# Patient Record
Sex: Male | Born: 1935
Health system: Southern US, Community
[De-identification: ages and names within clinical notes are randomized; demographics above are authoritative.]

## PROBLEM LIST (undated history)

## (undated) DIAGNOSIS — E119 Type 2 diabetes mellitus without complications: Secondary | ICD-10-CM

## (undated) DIAGNOSIS — K5790 Diverticulosis of intestine, part unspecified, without perforation or abscess without bleeding: Secondary | ICD-10-CM

## (undated) DIAGNOSIS — E785 Hyperlipidemia, unspecified: Secondary | ICD-10-CM

## (undated) DIAGNOSIS — Z9861 Coronary angioplasty status: Secondary | ICD-10-CM

## (undated) DIAGNOSIS — M1990 Unspecified osteoarthritis, unspecified site: Secondary | ICD-10-CM

## (undated) DIAGNOSIS — I2102 ST elevation (STEMI) myocardial infarction involving left anterior descending coronary artery: Secondary | ICD-10-CM

## (undated) DIAGNOSIS — I480 Paroxysmal atrial fibrillation: Secondary | ICD-10-CM

## (undated) DIAGNOSIS — I10 Essential (primary) hypertension: Secondary | ICD-10-CM

## (undated) DIAGNOSIS — D649 Anemia, unspecified: Secondary | ICD-10-CM

## (undated) DIAGNOSIS — K219 Gastro-esophageal reflux disease without esophagitis: Secondary | ICD-10-CM

## (undated) DIAGNOSIS — I700 Atherosclerosis of aorta: Secondary | ICD-10-CM

## (undated) DIAGNOSIS — I639 Cerebral infarction, unspecified: Secondary | ICD-10-CM

## (undated) DIAGNOSIS — I739 Peripheral vascular disease, unspecified: Secondary | ICD-10-CM

## (undated) DIAGNOSIS — K2210 Ulcer of esophagus without bleeding: Secondary | ICD-10-CM

## (undated) DIAGNOSIS — E039 Hypothyroidism, unspecified: Secondary | ICD-10-CM

## (undated) DIAGNOSIS — N183 Chronic kidney disease, stage 3 unspecified: Secondary | ICD-10-CM

## (undated) DIAGNOSIS — K922 Gastrointestinal hemorrhage, unspecified: Secondary | ICD-10-CM

## (undated) DIAGNOSIS — K269 Duodenal ulcer, unspecified as acute or chronic, without hemorrhage or perforation: Secondary | ICD-10-CM

## (undated) DIAGNOSIS — I2510 Atherosclerotic heart disease of native coronary artery without angina pectoris: Secondary | ICD-10-CM

## (undated) DIAGNOSIS — E559 Vitamin D deficiency, unspecified: Secondary | ICD-10-CM

## (undated) DIAGNOSIS — R578 Other shock: Secondary | ICD-10-CM

## (undated) HISTORY — DX: ST elevation (STEMI) myocardial infarction involving left anterior descending coronary artery: I21.02

## (undated) HISTORY — DX: Duodenal ulcer, unspecified as acute or chronic, without hemorrhage or perforation: K26.9

## (undated) HISTORY — DX: Vitamin D deficiency, unspecified: E55.9

## (undated) HISTORY — DX: Atherosclerosis of aorta: I70.0

## (undated) HISTORY — DX: Atherosclerotic heart disease of native coronary artery without angina pectoris: Z98.61

## (undated) HISTORY — DX: Other shock: R57.8

## (undated) HISTORY — DX: Atherosclerotic heart disease of native coronary artery without angina pectoris: I25.10

## (undated) HISTORY — DX: Diverticulosis of intestine, part unspecified, without perforation or abscess without bleeding: K57.90

## (undated) HISTORY — DX: Unspecified osteoarthritis, unspecified site: M19.90

## (undated) HISTORY — DX: Chronic kidney disease, stage 3 unspecified: N18.30

## (undated) HISTORY — DX: Ulcer of esophagus without bleeding: K22.10

---

## 1998-10-01 HISTORY — PX: BASAL CELL CARCINOMA EXCISION: SHX1214

## 1999-09-13 ENCOUNTER — Ambulatory Visit (HOSPITAL_BASED_OUTPATIENT_CLINIC_OR_DEPARTMENT_OTHER): Admission: RE | Admit: 1999-09-13 | Discharge: 1999-09-13 | Payer: Self-pay | Admitting: Plastic Surgery

## 2000-10-01 HISTORY — PX: CARPAL TUNNEL RELEASE: SHX101

## 2000-10-21 ENCOUNTER — Encounter: Payer: Self-pay | Admitting: *Deleted

## 2000-10-21 ENCOUNTER — Encounter: Admission: RE | Admit: 2000-10-21 | Discharge: 2000-10-21 | Payer: Self-pay | Admitting: *Deleted

## 2000-10-23 ENCOUNTER — Ambulatory Visit (HOSPITAL_BASED_OUTPATIENT_CLINIC_OR_DEPARTMENT_OTHER): Admission: RE | Admit: 2000-10-23 | Discharge: 2000-10-23 | Payer: Self-pay | Admitting: *Deleted

## 2001-04-04 ENCOUNTER — Emergency Department (HOSPITAL_COMMUNITY): Admission: EM | Admit: 2001-04-04 | Discharge: 2001-04-04 | Payer: Self-pay | Admitting: Emergency Medicine

## 2003-10-21 ENCOUNTER — Ambulatory Visit (HOSPITAL_COMMUNITY): Admission: RE | Admit: 2003-10-21 | Discharge: 2003-10-21 | Payer: Self-pay | Admitting: *Deleted

## 2009-10-01 HISTORY — PX: BASAL CELL CARCINOMA EXCISION: SHX1214

## 2010-11-07 ENCOUNTER — Emergency Department (HOSPITAL_BASED_OUTPATIENT_CLINIC_OR_DEPARTMENT_OTHER): Admission: EM | Admit: 2010-11-07 | Payer: Self-pay | Source: Home / Self Care

## 2010-11-07 ENCOUNTER — Emergency Department (HOSPITAL_COMMUNITY): Payer: Medicare Other

## 2010-11-07 ENCOUNTER — Inpatient Hospital Stay (HOSPITAL_COMMUNITY)
Admission: EM | Admit: 2010-11-07 | Discharge: 2010-11-10 | DRG: 064 | Disposition: A | Discharge status: Skilled Nursing Facility | Payer: Medicare Other | Attending: Internal Medicine | Admitting: Internal Medicine

## 2010-11-07 ENCOUNTER — Emergency Department (HOSPITAL_BASED_OUTPATIENT_CLINIC_OR_DEPARTMENT_OTHER)
Admission: EM | Admit: 2010-11-07 | Discharge: 2010-11-07 | Disposition: A | Discharge status: Other Acute Inpatient Hospital | Payer: Medicare Other | Source: Home / Self Care | Admitting: Emergency Medicine

## 2010-11-07 ENCOUNTER — Inpatient Hospital Stay (HOSPITAL_COMMUNITY)
Admission: AD | Admit: 2010-11-07 | Payer: Self-pay | Source: Other Acute Inpatient Hospital | Admitting: Internal Medicine

## 2010-11-07 DIAGNOSIS — R5381 Other malaise: Secondary | ICD-10-CM | POA: Diagnosis present

## 2010-11-07 DIAGNOSIS — Z7982 Long term (current) use of aspirin: Secondary | ICD-10-CM

## 2010-11-07 DIAGNOSIS — R7309 Other abnormal glucose: Secondary | ICD-10-CM | POA: Diagnosis present

## 2010-11-07 DIAGNOSIS — K219 Gastro-esophageal reflux disease without esophagitis: Secondary | ICD-10-CM | POA: Insufficient documentation

## 2010-11-07 DIAGNOSIS — I498 Other specified cardiac arrhythmias: Secondary | ICD-10-CM | POA: Diagnosis present

## 2010-11-07 DIAGNOSIS — I6350 Cerebral infarction due to unspecified occlusion or stenosis of unspecified cerebral artery: Principal | ICD-10-CM | POA: Diagnosis present

## 2010-11-07 DIAGNOSIS — E7800 Pure hypercholesterolemia, unspecified: Secondary | ICD-10-CM | POA: Diagnosis present

## 2010-11-07 DIAGNOSIS — I10 Essential (primary) hypertension: Secondary | ICD-10-CM | POA: Insufficient documentation

## 2010-11-07 DIAGNOSIS — R29898 Other symptoms and signs involving the musculoskeletal system: Secondary | ICD-10-CM | POA: Diagnosis present

## 2010-11-07 DIAGNOSIS — E669 Obesity, unspecified: Secondary | ICD-10-CM | POA: Diagnosis present

## 2010-11-07 DIAGNOSIS — M109 Gout, unspecified: Secondary | ICD-10-CM | POA: Diagnosis present

## 2010-11-07 DIAGNOSIS — E1100 Type 2 diabetes mellitus with hyperosmolarity without nonketotic hyperglycemic-hyperosmolar coma (NKHHC): Secondary | ICD-10-CM | POA: Diagnosis present

## 2010-11-07 DIAGNOSIS — E785 Hyperlipidemia, unspecified: Secondary | ICD-10-CM | POA: Diagnosis present

## 2010-11-07 LAB — COMPREHENSIVE METABOLIC PANEL
ALT: 31 U/L (ref 0–53)
ALT: 24 U/L (ref 0–53)
AST: 35 U/L (ref 0–37)
AST: 31 U/L (ref 0–37)
Albumin: 4.6 g/dL (ref 3.5–5.2)
Albumin: 4.1 g/dL (ref 3.5–5.2)
Alkaline Phosphatase: 64 U/L (ref 39–117)
Alkaline Phosphatase: 35 U/L — ABNORMAL LOW (ref 39–117)
BUN: 27 mg/dL — ABNORMAL HIGH (ref 6–23)
BUN: 23 mg/dL (ref 6–23)
CO2: 20 mEq/L (ref 19–32)
CO2: 24 mEq/L (ref 19–32)
Calcium: 10.4 mg/dL (ref 8.4–10.5)
Calcium: 9.9 mg/dL (ref 8.4–10.5)
Chloride: 100 mEq/L (ref 96–112)
Chloride: 106 mEq/L (ref 96–112)
Creatinine, Ser: 1.2 mg/dL (ref 0.4–1.5)
Creatinine, Ser: 1.25 mg/dL (ref 0.4–1.5)
GFR calc Af Amer: >60 mL/min (ref >60)
GFR calc Af Amer: >60 mL/min (ref >60)
GFR calc non Af Amer: 58 mL/min — ABNORMAL LOW (ref >60)
GFR calc non Af Amer: 55 mL/min — ABNORMAL LOW (ref >60)
Glucose, Bld: 599 mg/dL (ref 70–99)
Glucose, Bld: 250 mg/dL — ABNORMAL HIGH (ref 70–99)
Potassium: 4.7 mEq/L (ref 3.5–5.1)
Potassium: 4.4 mEq/L (ref 3.5–5.1)
Sodium: 136 mEq/L (ref 135–145)
Sodium: 140 mEq/L (ref 135–145)
Total Bilirubin: 1.1 mg/dL (ref 0.3–1.2)
Total Bilirubin: 1.1 mg/dL (ref 0.3–1.2)
Total Protein: 7.9 g/dL (ref 6.0–8.3)
Total Protein: 6.9 g/dL (ref 6.0–8.3)

## 2010-11-07 LAB — CBC
HCT: 41.1 % (ref 39.0–52.0)
HCT: 40.1 % (ref 39.0–52.0)
Hemoglobin: 15 g/dL (ref 13.0–17.0)
Hemoglobin: 14.5 g/dL (ref 13.0–17.0)
MCH: 33 pg (ref 26.0–34.0)
MCH: 33 pg (ref 26.0–34.0)
MCHC: 36.5 g/dL — ABNORMAL HIGH (ref 30.0–36.0)
MCHC: 36.2 g/dL — ABNORMAL HIGH (ref 30.0–36.0)
MCV: 90.5 fL (ref 78.0–100.0)
MCV: 91.3 fL (ref 78.0–100.0)
Platelets: 285 10*3/uL (ref 150–400)
Platelets: 258 10*3/uL (ref 150–400)
RBC: 4.54 MIL/uL (ref 4.22–5.81)
RBC: 4.39 MIL/uL (ref 4.22–5.81)
RDW: 12.5 % (ref 11.5–15.5)
RDW: 12.5 % (ref 11.5–15.5)
WBC: 12.9 10*3/uL — ABNORMAL HIGH (ref 4.0–10.5)
WBC: 14.6 10*3/uL — ABNORMAL HIGH (ref 4.0–10.5)

## 2010-11-07 LAB — APTT: aPTT: 24 seconds (ref 24–37)

## 2010-11-07 LAB — DIFFERENTIAL
Basophils Absolute: 0 10*3/uL (ref 0.0–0.1)
Basophils Absolute: 0.1 10*3/uL (ref 0.0–0.1)
Basophils Relative: 0 % (ref 0–1)
Basophils Relative: 0 % (ref 0–1)
Eosinophils Absolute: 0 10*3/uL (ref 0.0–0.7)
Eosinophils Absolute: 0 10*3/uL (ref 0.0–0.7)
Eosinophils Relative: 0 % (ref 0–5)
Eosinophils Relative: 0 % (ref 0–5)
Lymphocytes Relative: 15 % (ref 12–46)
Lymphocytes Relative: 22 % (ref 12–46)
Lymphs Abs: 1.9 10*3/uL (ref 0.7–4.0)
Lymphs Abs: 3.2 10*3/uL (ref 0.7–4.0)
Monocytes Absolute: 1.3 10*3/uL — ABNORMAL HIGH (ref 0.1–1.0)
Monocytes Absolute: 1.4 10*3/uL — ABNORMAL HIGH (ref 0.1–1.0)
Monocytes Relative: 10 % (ref 3–12)
Monocytes Relative: 9 % (ref 3–12)
Neutro Abs: 9.7 10*3/uL — ABNORMAL HIGH (ref 1.7–7.7)
Neutro Abs: 10 10*3/uL — ABNORMAL HIGH (ref 1.7–7.7)
Neutrophils Relative %: 75 % (ref 43–77)
Neutrophils Relative %: 68 % (ref 43–77)

## 2010-11-07 LAB — URINALYSIS, ROUTINE W REFLEX MICROSCOPIC
Bilirubin Urine: NEGATIVE
Bilirubin Urine: NEGATIVE
Hgb urine dipstick: NEGATIVE
Hgb urine dipstick: NEGATIVE
Ketones, ur: NEGATIVE mg/dL
Ketones, ur: 15 mg/dL — AB
Leukocytes, UA: NEGATIVE
Leukocytes, UA: NEGATIVE
Nitrite: NEGATIVE
Nitrite: NEGATIVE
Protein, ur: NEGATIVE mg/dL
Protein, ur: NEGATIVE mg/dL
Specific Gravity, Urine: 1.029 (ref 1.005–1.030)
Specific Gravity, Urine: 1.024 (ref 1.005–1.030)
Urine Glucose, Fasting: >1000 mg/dL — AB
Urine Glucose, Fasting: >1000 mg/dL — AB
Urobilinogen, UA: 0.2 mg/dL (ref 0.0–1.0)
Urobilinogen, UA: 0.2 mg/dL (ref 0.0–1.0)
pH: 5.5 (ref 5.0–8.0)
pH: 6 (ref 5.0–8.0)

## 2010-11-07 LAB — PROTIME-INR
INR: 0.99 (ref 0.00–1.49)
Prothrombin Time: 13.3 seconds (ref 11.6–15.2)

## 2010-11-07 LAB — URINE MICROSCOPIC-ADD ON

## 2010-11-07 LAB — CK TOTAL AND CKMB (NOT AT ARMC)
CK, MB: 4.6 ng/mL — ABNORMAL HIGH (ref 0.3–4.0)
Relative Index: 2.2 (ref 0.0–2.5)
Total CK: 206 U/L (ref 7–232)

## 2010-11-07 LAB — TROPONIN I: Troponin I: 0.02 ng/mL (ref 0.00–0.06)

## 2010-11-08 ENCOUNTER — Inpatient Hospital Stay (HOSPITAL_COMMUNITY): Payer: Medicare Other

## 2010-11-08 LAB — BASIC METABOLIC PANEL
BUN: 16 mg/dL (ref 6–23)
CO2: 22 mEq/L (ref 19–32)
Calcium: 8.6 mg/dL (ref 8.4–10.5)
Chloride: 108 mEq/L (ref 96–112)
Creatinine, Ser: 0.96 mg/dL (ref 0.4–1.5)
GFR calc Af Amer: >60 mL/min (ref >60)
GFR calc non Af Amer: >60 mL/min (ref >60)
Glucose, Bld: 208 mg/dL — ABNORMAL HIGH (ref 70–99)
Potassium: 3.6 mEq/L (ref 3.5–5.1)
Sodium: 137 mEq/L (ref 135–145)

## 2010-11-08 LAB — GLUCOSE, CAPILLARY
Glucose-Capillary: 583 mg/dL (ref 70–99)
Glucose-Capillary: 228 mg/dL — ABNORMAL HIGH (ref 70–99)
Glucose-Capillary: 249 mg/dL — ABNORMAL HIGH (ref 70–99)
Glucose-Capillary: 279 mg/dL — ABNORMAL HIGH (ref 70–99)
Glucose-Capillary: 259 mg/dL — ABNORMAL HIGH (ref 70–99)
Glucose-Capillary: 374 mg/dL — ABNORMAL HIGH (ref 70–99)
Glucose-Capillary: 206 mg/dL — ABNORMAL HIGH (ref 70–99)

## 2010-11-08 LAB — MAGNESIUM: Magnesium: 1.8 mg/dL (ref 1.5–2.5)

## 2010-11-08 LAB — LIPID PANEL
Cholesterol: 130 mg/dL (ref 0–200)
HDL: 29 mg/dL — ABNORMAL LOW (ref >39)
LDL Cholesterol: 47 mg/dL (ref 0–99)
Total CHOL/HDL Ratio: 4.5 RATIO
Triglycerides: 268 mg/dL — ABNORMAL HIGH (ref <150)
VLDL: 54 mg/dL — ABNORMAL HIGH (ref 0–40)

## 2010-11-08 LAB — CARDIAC PANEL(CRET KIN+CKTOT+MB+TROPI)
CK, MB: 5.8 ng/mL — ABNORMAL HIGH (ref 0.3–4.0)
Relative Index: 1.4 (ref 0.0–2.5)
Total CK: 401 U/L — ABNORMAL HIGH (ref 7–232)
Troponin I: 0.01 ng/mL (ref 0.00–0.06)

## 2010-11-08 LAB — POCT CARDIAC MARKERS
CKMB, poc: 3.1 ng/mL (ref 1.0–8.0)
Myoglobin, poc: 314 ng/mL (ref 12–200)
Troponin i, poc: <0.05 ng/mL (ref 0.00–0.09)

## 2010-11-08 LAB — CBC
HCT: 41.7 % (ref 39.0–52.0)
Hemoglobin: 15 g/dL (ref 13.0–17.0)
MCH: 33.6 pg (ref 26.0–34.0)
MCHC: 36 g/dL (ref 30.0–36.0)
MCV: 93.5 fL (ref 78.0–100.0)
Platelets: 245 10*3/uL (ref 150–400)
RBC: 4.46 MIL/uL (ref 4.22–5.81)
RDW: 12.8 % (ref 11.5–15.5)
WBC: 15.1 10*3/uL — ABNORMAL HIGH (ref 4.0–10.5)

## 2010-11-08 LAB — HEMOGLOBIN A1C
Hgb A1c MFr Bld: 11.2 % — ABNORMAL HIGH (ref <5.7)
Mean Plasma Glucose: 275 mg/dL — ABNORMAL HIGH (ref <117)

## 2010-11-09 ENCOUNTER — Inpatient Hospital Stay (HOSPITAL_COMMUNITY): Payer: Medicare Other

## 2010-11-09 DIAGNOSIS — I6789 Other cerebrovascular disease: Secondary | ICD-10-CM

## 2010-11-09 LAB — GLUCOSE, CAPILLARY
Glucose-Capillary: 194 mg/dL — ABNORMAL HIGH (ref 70–99)
Glucose-Capillary: 265 mg/dL — ABNORMAL HIGH (ref 70–99)
Glucose-Capillary: 274 mg/dL — ABNORMAL HIGH (ref 70–99)
Glucose-Capillary: 293 mg/dL — ABNORMAL HIGH (ref 70–99)
Glucose-Capillary: 224 mg/dL — ABNORMAL HIGH (ref 70–99)
Glucose-Capillary: 219 mg/dL — ABNORMAL HIGH (ref 70–99)

## 2010-11-09 LAB — LIPASE, BLOOD: Lipase: 34 U/L (ref 11–59)

## 2010-11-09 LAB — CARDIAC PANEL(CRET KIN+CKTOT+MB+TROPI)
CK, MB: 4.8 ng/mL — ABNORMAL HIGH (ref 0.3–4.0)
CK, MB: 6.3 ng/mL (ref 0.3–4.0)
Relative Index: 1.4 (ref 0.0–2.5)
Relative Index: 1.7 (ref 0.0–2.5)
Total CK: 332 U/L — ABNORMAL HIGH (ref 7–232)
Total CK: 371 U/L — ABNORMAL HIGH (ref 7–232)
Troponin I: 0.02 ng/mL (ref 0.00–0.06)
Troponin I: 0.01 ng/mL (ref 0.00–0.06)

## 2010-11-09 LAB — BASIC METABOLIC PANEL
BUN: 14 mg/dL (ref 6–23)
CO2: 24 mEq/L (ref 19–32)
Calcium: 8.7 mg/dL (ref 8.4–10.5)
Chloride: 105 mEq/L (ref 96–112)
Creatinine, Ser: 0.89 mg/dL (ref 0.4–1.5)
GFR calc Af Amer: >60 mL/min (ref >60)
GFR calc non Af Amer: >60 mL/min (ref >60)
Glucose, Bld: 269 mg/dL — ABNORMAL HIGH (ref 70–99)
Potassium: 3.7 mEq/L (ref 3.5–5.1)
Sodium: 136 mEq/L (ref 135–145)

## 2010-11-10 LAB — BASIC METABOLIC PANEL
BUN: 22 mg/dL (ref 6–23)
CO2: 21 mEq/L (ref 19–32)
Calcium: 8.7 mg/dL (ref 8.4–10.5)
Chloride: 106 mEq/L (ref 96–112)
Creatinine, Ser: 0.88 mg/dL (ref 0.4–1.5)
GFR calc Af Amer: >60 mL/min (ref >60)
GFR calc non Af Amer: >60 mL/min (ref >60)
Glucose, Bld: 247 mg/dL — ABNORMAL HIGH (ref 70–99)
Potassium: 4 mEq/L (ref 3.5–5.1)
Sodium: 136 mEq/L (ref 135–145)

## 2010-11-10 LAB — GLUCOSE, CAPILLARY
Glucose-Capillary: 237 mg/dL — ABNORMAL HIGH (ref 70–99)
Glucose-Capillary: 344 mg/dL — ABNORMAL HIGH (ref 70–99)

## 2010-11-12 ENCOUNTER — Inpatient Hospital Stay (HOSPITAL_COMMUNITY)
Admission: EM | Admit: 2010-11-12 | Discharge: 2010-11-20 | DRG: 378 | Disposition: A | Discharge status: Skilled Nursing Facility | Payer: Medicare Other | Attending: Endocrinology | Admitting: Endocrinology

## 2010-11-12 ENCOUNTER — Emergency Department (HOSPITAL_COMMUNITY): Payer: Medicare Other

## 2010-11-12 DIAGNOSIS — K264 Chronic or unspecified duodenal ulcer with hemorrhage: Principal | ICD-10-CM | POA: Diagnosis present

## 2010-11-12 DIAGNOSIS — I6529 Occlusion and stenosis of unspecified carotid artery: Secondary | ICD-10-CM | POA: Diagnosis present

## 2010-11-12 DIAGNOSIS — Z794 Long term (current) use of insulin: Secondary | ICD-10-CM

## 2010-11-12 DIAGNOSIS — Z8673 Personal history of transient ischemic attack (TIA), and cerebral infarction without residual deficits: Secondary | ICD-10-CM

## 2010-11-12 DIAGNOSIS — M109 Gout, unspecified: Secondary | ICD-10-CM | POA: Diagnosis present

## 2010-11-12 DIAGNOSIS — E785 Hyperlipidemia, unspecified: Secondary | ICD-10-CM | POA: Diagnosis present

## 2010-11-12 DIAGNOSIS — D62 Acute posthemorrhagic anemia: Secondary | ICD-10-CM | POA: Diagnosis present

## 2010-11-12 DIAGNOSIS — K219 Gastro-esophageal reflux disease without esophagitis: Secondary | ICD-10-CM | POA: Diagnosis present

## 2010-11-12 DIAGNOSIS — I10 Essential (primary) hypertension: Secondary | ICD-10-CM | POA: Diagnosis present

## 2010-11-12 DIAGNOSIS — E7800 Pure hypercholesterolemia, unspecified: Secondary | ICD-10-CM | POA: Diagnosis present

## 2010-11-12 DIAGNOSIS — IMO0001 Reserved for inherently not codable concepts without codable children: Secondary | ICD-10-CM | POA: Diagnosis present

## 2010-11-12 LAB — COMPREHENSIVE METABOLIC PANEL
ALT: 20 U/L (ref 0–53)
AST: 21 U/L (ref 0–37)
Albumin: 2.8 g/dL — ABNORMAL LOW (ref 3.5–5.2)
Alkaline Phosphatase: 27 U/L — ABNORMAL LOW (ref 39–117)
BUN: 59 mg/dL — ABNORMAL HIGH (ref 6–23)
CO2: 21 mEq/L (ref 19–32)
Calcium: 8.8 mg/dL (ref 8.4–10.5)
Chloride: 104 mEq/L (ref 96–112)
Creatinine, Ser: 1.41 mg/dL (ref 0.4–1.5)
GFR calc Af Amer: 58 mL/min — ABNORMAL LOW (ref >60)
GFR calc non Af Amer: 48 mL/min — ABNORMAL LOW (ref >60)
Glucose, Bld: 407 mg/dL — ABNORMAL HIGH (ref 70–99)
Potassium: 4.8 mEq/L (ref 3.5–5.1)
Sodium: 136 mEq/L (ref 135–145)
Total Bilirubin: 0.4 mg/dL (ref 0.3–1.2)
Total Protein: 4.9 g/dL — ABNORMAL LOW (ref 6.0–8.3)

## 2010-11-12 LAB — PROTIME-INR
INR: 1.18 (ref 0.00–1.49)
Prothrombin Time: 15.2 seconds (ref 11.6–15.2)

## 2010-11-12 LAB — POCT I-STAT 3, VENOUS BLOOD GAS (G3P V)
Acid-base deficit: 3 mmol/L — ABNORMAL HIGH (ref 0.0–2.0)
Bicarbonate: 21 mEq/L (ref 20.0–24.0)
O2 Saturation: 41 %
TCO2: 22 mmol/L (ref 0–100)
pCO2, Ven: 34 mmHg — ABNORMAL LOW (ref 45.0–50.0)
pH, Ven: 7.398 — ABNORMAL HIGH (ref 7.250–7.300)
pO2, Ven: 23 mmHg — CL (ref 30.0–45.0)

## 2010-11-12 LAB — URINALYSIS, ROUTINE W REFLEX MICROSCOPIC
Bilirubin Urine: NEGATIVE
Hgb urine dipstick: NEGATIVE
Ketones, ur: 15 mg/dL — AB
Leukocytes, UA: NEGATIVE
Nitrite: NEGATIVE
Protein, ur: NEGATIVE mg/dL
Specific Gravity, Urine: 1.022 (ref 1.005–1.030)
Urine Glucose, Fasting: >1000 mg/dL — AB
Urobilinogen, UA: 0.2 mg/dL (ref 0.0–1.0)
pH: 5.5 (ref 5.0–8.0)

## 2010-11-12 LAB — CBC
HCT: 24.4 % — ABNORMAL LOW (ref 39.0–52.0)
Hemoglobin: 8.7 g/dL — ABNORMAL LOW (ref 13.0–17.0)
MCH: 33.5 pg (ref 26.0–34.0)
MCHC: 35.7 g/dL (ref 30.0–36.0)
MCV: 93.8 fL (ref 78.0–100.0)
Platelets: 251 10*3/uL (ref 150–400)
RBC: 2.6 MIL/uL — ABNORMAL LOW (ref 4.22–5.81)
RDW: 13.3 % (ref 11.5–15.5)
WBC: 17.6 10*3/uL — ABNORMAL HIGH (ref 4.0–10.5)

## 2010-11-12 LAB — OCCULT BLOOD, POC DEVICE: Fecal Occult Bld: POSITIVE

## 2010-11-12 LAB — URINE MICROSCOPIC-ADD ON

## 2010-11-12 LAB — POCT CARDIAC MARKERS
CKMB, poc: 1.1 ng/mL (ref 1.0–8.0)
Myoglobin, poc: 116 ng/mL (ref 12–200)
Troponin i, poc: <0.05 ng/mL (ref 0.00–0.09)

## 2010-11-12 LAB — APTT: aPTT: 22 seconds — ABNORMAL LOW (ref 24–37)

## 2010-11-12 NOTE — H&P (Signed)
NAMEALEXANDRE, FARIES NO.:  [id number]  MEDICAL RECORD NO.:  [id number]           PATIENT TYPE:  LOCATION:                                 FACILITY:  PHYSICIAN:  Houston Siren, MD           DATE OF BIRTH:  March 20, 1924  DATE OF ADMISSION: DATE OF DISCHARGE:                             HISTORY & PHYSICAL   PRIMARY CARE PHYSICIAN:  Brooke Bonito, MD  ADVANCE DIRECTIVE:  Full code.  REASON FOR ADMISSION:  Right brain stroke.  HISTORY OF PRESENT ILLNESS:  This is an 77 year old male with history of hypertension and onset diabetes, hypercholesterolemia who presented to the emergency room as a Code Stroke because of left-sided weakness and complained of leaning toward the left.  He was seen in consultation with the neurologist who did not think he is a TPA candidate because of his mild symptomatology.  The timing is also out of the TPA window as well as he reportedly had his initial symptoms more than 8 hours PTA.  Apparently, history also noted that his PCP was changing his oral hypoglycemic agent, glimepiride to Kombiglyze XR 01-999, but patient stopped it for a few days because it made him  malaise.  He could not elaborate the exact symptoms.  He has had no diarrhea, palpitation, shortness of breath, headache, nausea, vomiting, or any other symptomatology.  Workup included a CT scan of the head which shows question if there is a subacute right parietal CVA.  He also has elevated blood glucose in the 600.  His EKG shows sinus tachycardia at 110.  His potassium is 4.7, creatinine of 1.2.  He has negative cardiac enzymes.  He has slight elevation of his white count of 12,900, and hemoglobin of 15.0.  Hospitalist was asked to admit the patient for acute right hemispheric CVA.  PAST MEDICAL HISTORY:  Gout, GERD, hypercholesterolemia, hypertension, and diabetes.  SOCIAL HISTORY:  He denied tobacco, alcohol, or drug use.  He lives alone.  FAMILY HISTORY:   Noncontributory.  CURRENT MEDICATIONS:  Aspirin, Nexium, Crestor 20 mg per day, allopurinol, Kombiglyze XR 01-999, enalapril 5 mg per day, and Triplex 135 mg per day.  PHYSICAL EXAMINATION:  VITAL SIGNS:  Shows blood pressure 160/70, pulse of 100, respiratory rate of 20. GENERAL:  He is alert and oriented and conversing.  His speech is fluent.  Tongue is midline.  He had no facial symmetry.  I did not hear any carotid bruit. NECK:  Supple. CARDIAC:  Revealed an S1 and S2, irregular, without any murmur, rub, or gallop. LUNGS:  Clear with no rales or any evidence of consolidation. ABDOMEN:  Soft, nondistended, nontender. EXTREMITIES:  No edema.  No calf tenderness.  He has good distal pulses bilaterally.  Strength is weak on the left side.  He does have a pronator drift of the upper extremities. NEUROLOGIC:  Cerebellar function is off with finger-to-nose on the left and heel-to-shin on the left as well. PSYCHIATRIC:  Unremarkable as well.  OBJECTIVE FINDING:  EKG shows sinus tachycardia without any acute ST-T changes.  Troponin less  than 0.05.  Serum sodium 136, potassium 4.7, chloride of 100, glucose of 599, creatinine 1.2.  Liver function tests are normal.  White count of 12,900, hemoglobin of 15.0, platelet count of 285,000.  Urinalysis is negative.  IMPRESSION:  This is an 77 year old male who omitted his new diabetic medication because he was feeling malaise, presented with blood glucose of 600, along with a right brain cerebrovascular accident.  He was seen in consultation with neurologist, and we will admit him for further stroke workup.  He will get MRI and MRA of his brain along with carotid Doppler and transthoracic echo.  He will get his blood glucose under better control along with continue his full-dose aspirin.  I will continue his cholesterol pill including the Triplex and the Crestor.  We will put him on insulin sliding scale for now and hold his  oral antihyperglycemic agent.  He is a full code, will be admitted to the Hospitalist on the telemetry.     Houston Siren, MD     PL/MEDQ  D:  11/08/2010  T:  11/08/2010  Job:  [id number]  Electronically Signed by Houston Siren  on 11/12/2010 06:19:14 AM

## 2010-11-13 LAB — HEMOGLOBIN AND HEMATOCRIT, BLOOD
HCT: 22.3 % — ABNORMAL LOW (ref 39.0–52.0)
Hemoglobin: 7.9 g/dL — ABNORMAL LOW (ref 13.0–17.0)

## 2010-11-13 LAB — CARDIAC PANEL(CRET KIN+CKTOT+MB+TROPI)
CK, MB: 2.4 ng/mL (ref 0.3–4.0)
CK, MB: 2.5 ng/mL (ref 0.3–4.0)
Relative Index: INVALID (ref 0.0–2.5)
Relative Index: INVALID (ref 0.0–2.5)
Total CK: 34 U/L (ref 7–232)
Total CK: 40 U/L (ref 7–232)
Troponin I: 0.03 ng/mL (ref 0.00–0.06)
Troponin I: 0.05 ng/mL (ref 0.00–0.06)

## 2010-11-13 LAB — HEMOGLOBIN A1C
Hgb A1c MFr Bld: 10 % — ABNORMAL HIGH (ref <5.7)
Mean Plasma Glucose: 240 mg/dL — ABNORMAL HIGH (ref <117)

## 2010-11-13 LAB — MRSA PCR SCREENING: MRSA by PCR: NEGATIVE

## 2010-11-13 LAB — CK TOTAL AND CKMB (NOT AT ARMC)
CK, MB: 2.6 ng/mL (ref 0.3–4.0)
Relative Index: INVALID (ref 0.0–2.5)
Total CK: 42 U/L (ref 7–232)

## 2010-11-13 LAB — GLUCOSE, CAPILLARY
Glucose-Capillary: 383 mg/dL — ABNORMAL HIGH (ref 70–99)
Glucose-Capillary: 343 mg/dL — ABNORMAL HIGH (ref 70–99)
Glucose-Capillary: 343 mg/dL — ABNORMAL HIGH (ref 70–99)

## 2010-11-13 LAB — TROPONIN I: Troponin I: 0.03 ng/mL (ref 0.00–0.06)

## 2010-11-13 LAB — PREPARE RBC (CROSSMATCH)

## 2010-11-14 LAB — PHOSPHORUS: Phosphorus: 3.2 mg/dL (ref 2.3–4.6)

## 2010-11-14 LAB — CARDIAC PANEL(CRET KIN+CKTOT+MB+TROPI)
CK, MB: 3 ng/mL (ref 0.3–4.0)
CK, MB: 3.2 ng/mL (ref 0.3–4.0)
Relative Index: INVALID (ref 0.0–2.5)
Relative Index: INVALID (ref 0.0–2.5)
Total CK: 42 U/L (ref 7–232)
Total CK: 51 U/L (ref 7–232)
Troponin I: 0.03 ng/mL (ref 0.00–0.06)
Troponin I: 0.03 ng/mL (ref 0.00–0.06)

## 2010-11-14 LAB — GLUCOSE, CAPILLARY
Glucose-Capillary: 216 mg/dL — ABNORMAL HIGH (ref 70–99)
Glucose-Capillary: 227 mg/dL — ABNORMAL HIGH (ref 70–99)
Glucose-Capillary: 295 mg/dL — ABNORMAL HIGH (ref 70–99)
Glucose-Capillary: 233 mg/dL — ABNORMAL HIGH (ref 70–99)
Glucose-Capillary: 285 mg/dL — ABNORMAL HIGH (ref 70–99)
Glucose-Capillary: 287 mg/dL — ABNORMAL HIGH (ref 70–99)
Glucose-Capillary: 294 mg/dL — ABNORMAL HIGH (ref 70–99)
Glucose-Capillary: 301 mg/dL — ABNORMAL HIGH (ref 70–99)
Glucose-Capillary: 317 mg/dL — ABNORMAL HIGH (ref 70–99)
Glucose-Capillary: 349 mg/dL — ABNORMAL HIGH (ref 70–99)
Glucose-Capillary: 190 mg/dL — ABNORMAL HIGH (ref 70–99)
Glucose-Capillary: 187 mg/dL — ABNORMAL HIGH (ref 70–99)

## 2010-11-14 LAB — CBC
HCT: 27.2 % — ABNORMAL LOW (ref 39.0–52.0)
HCT: 25.8 % — ABNORMAL LOW (ref 39.0–52.0)
HCT: 23.2 % — ABNORMAL LOW (ref 39.0–52.0)
Hemoglobin: 9.6 g/dL — ABNORMAL LOW (ref 13.0–17.0)
Hemoglobin: 9 g/dL — ABNORMAL LOW (ref 13.0–17.0)
Hemoglobin: 8.1 g/dL — ABNORMAL LOW (ref 13.0–17.0)
MCH: 31.5 pg (ref 26.0–34.0)
MCH: 31.1 pg (ref 26.0–34.0)
MCH: 31.9 pg (ref 26.0–34.0)
MCHC: 35.3 g/dL (ref 30.0–36.0)
MCHC: 34.9 g/dL (ref 30.0–36.0)
MCHC: 34.9 g/dL (ref 30.0–36.0)
MCV: 89.2 fL (ref 78.0–100.0)
MCV: 89.3 fL (ref 78.0–100.0)
MCV: 91.3 fL (ref 78.0–100.0)
Platelets: 190 10*3/uL (ref 150–400)
Platelets: 160 10*3/uL (ref 150–400)
Platelets: 155 10*3/uL (ref 150–400)
RBC: 3.05 MIL/uL — ABNORMAL LOW (ref 4.22–5.81)
RBC: 2.89 MIL/uL — ABNORMAL LOW (ref 4.22–5.81)
RBC: 2.54 MIL/uL — ABNORMAL LOW (ref 4.22–5.81)
RDW: 14.9 % (ref 11.5–15.5)
RDW: 15.3 % (ref 11.5–15.5)
RDW: 15.4 % (ref 11.5–15.5)
WBC: 18.6 10*3/uL — ABNORMAL HIGH (ref 4.0–10.5)
WBC: 15.6 10*3/uL — ABNORMAL HIGH (ref 4.0–10.5)
WBC: 13.6 10*3/uL — ABNORMAL HIGH (ref 4.0–10.5)

## 2010-11-14 LAB — RENAL FUNCTION PANEL
Albumin: 2.5 g/dL — ABNORMAL LOW (ref 3.5–5.2)
BUN: 61 mg/dL — ABNORMAL HIGH (ref 6–23)
CO2: 24 mEq/L (ref 19–32)
Calcium: 8.2 mg/dL — ABNORMAL LOW (ref 8.4–10.5)
Chloride: 111 mEq/L (ref 96–112)
Creatinine, Ser: 1.39 mg/dL (ref 0.4–1.5)
GFR calc Af Amer: 59 mL/min — ABNORMAL LOW (ref >60)
GFR calc non Af Amer: 49 mL/min — ABNORMAL LOW (ref >60)
Glucose, Bld: 230 mg/dL — ABNORMAL HIGH (ref 70–99)
Phosphorus: 3 mg/dL (ref 2.3–4.6)
Potassium: 3.6 mEq/L (ref 3.5–5.1)
Sodium: 145 mEq/L (ref 135–145)

## 2010-11-14 LAB — MAGNESIUM
Magnesium: 1.6 mg/dL (ref 1.5–2.5)
Magnesium: 1.8 mg/dL (ref 1.5–2.5)

## 2010-11-14 NOTE — Consult Note (Signed)
NAMESHIZUO, BISKUP NO.:  [id number]  MEDICAL RECORD NO.:  [id number]           PATIENT TYPE:  I  LOCATION:  3739                         FACILITY:  [facility]  PHYSICIAN:  Marlan Palau, M.D.  DATE OF BIRTH:  [birth year]  DATE OF CONSULTATION:  11/07/2010 DATE OF DISCHARGE:  11/07/2010                                CONSULTATION   HISTORY OF PRESENT ILLNESS:  [name] is an 77 year old right-handed white male born on 1924/02/26, with a history of hypertension, diabetes.  This patient went to the [facility] this evening as he began to feel weak earlier today.  The patient was at the computer and noted onset of sensation of heaviness involving the left arm and left leg around 12 noon.  The patient denied any numbness on that side and denied headache and any visual changes.  The patient was able to drive his car to get lunch, but noted shortly thereafter that he was tending to lean to the left.  The patient went to the [facility] and was noted to have a blood sugar of around 600.  The patient has had some difficulty with left-sided weakness since that time.  The patient was evaluated by the ER physician at the [facility], and a code stroke was called around 9:30 p.m. this evening. The patient is sent urgently to [facility], but the code stroke was cancelled after it was determined that he is now about 10 hours out from the initial deficit.  NIH stroke scale score is 2.  The patient is not a t-PA candidate due to minimal deficit and due to duration of symptoms.  CT scan of the head appeared to show a small low density in the right parietal white matter, questionable whether this may be a subacute stroke.  Neurology is asked to see the patient for further evaluation.  PAST MEDICAL HISTORY:  Significant for: 1. New-onset right brain stroke. 2. Diabetes. 3. Hypertension. 4. Dyslipidemia. 5. Mild obesity. 6.  Gastroesophageal reflux disease. 7. Gout. 8. Bilateral cataract surgery. 9. Hyperosmolar state with elevated blood sugars.  MEDICATIONS: 1. Nexium 40 mg daily. 2. Aspirin 81 mg daily. 3. Crestor 20 mg daily. 4. Kombiglyze XR 01/999 one tablet daily. 5. Enalapril 5 mg daily. 6. Allopurinol 100 mg daily. 7. Trilipix 135 mg daily.  Again, the patient has no known allergies.  Does not smoke, drinks alcohol on occasions.  SOCIAL HISTORY:  This patient is widowed, lives in the [place], [place] area, has two daughters.  The patient is retired.  FAMILY MEDICAL HISTORY:  Father died with cancer.  Mother died with Alzheimer disease.  The patient was an only child.  REVIEW OF SYSTEMS:  Notable for no recent fevers, chills.  The patient denies headache, neck stiffness, back pain.  The patient denies shortness of breath, chest pains, abdominal pain, has had some urinary frequency over the last 2 days.  Denies nausea or vomiting.  The patient denied any blackout episodes of seizure events.  PHYSICAL EXAMINATION:  VITAL SIGNS:  Blood pressure of  160/73, heart rate 104, respiratory rate 20, temperature afebrile. GENERAL:  The patient is a minimally obese white male who is alert, cooperative at the time of examination. HEENT:  Head is atraumatic.  Eyes, pupils are equal, round, and reactive to light. NECK:  Supple.  No carotid bruits noted. RESPIRATORY:  Clear. CARDIOVASCULAR:  Regular rate and rhythm.  No obvious murmurs, rubs noted. EXTREMITIES:  Without significant edema. NEUROLOGIC:  Cranial nerves as above.  Facial symmetry is present.  The patient has good sensation of face to pinprick and soft touch bilaterally.  Has good strength in facial muscle and the muscles of head, trunk, and shoulder shrug bilaterally.  Speech is normal.  No aphasia is noted.  Motor testing reveals fairly good strength to direct testing on all fours.  The patient has minimal drift to the left  arm. No drift to the left leg with no drift on the right side.  The patient has clear evidence of ataxia with finger-nose-finger on the left arm, normal with the left leg and the right side.  The patient has good pinprick, soft touch, vibratory sensation throughout.  No evidence of extinction is noted.  Again, deep tendon reflexes are symmetric.  Toes downgoing.  LABORATORY DATA:  Notable for white count of 14.6, hemoglobin 14.5, hematocrit 40.1, MCV of 91.3, platelets of 258.  Sodium 136, potassium 4.7, chloride of 100, CO2 of 20, glucose of 599, BUN of 27, creatinine 1.2, total bili 1.1, alk phosphatase 64, SGOT of 35, SGPT of 31, total protein 7.9, albumin 4.6, calcium 10.4.  Urinalysis reveals specific gravity of 1.029, pH of 5.5.  IMPRESSION: 1. New-onset right brain stroke. 2. Diabetes with the hyperosmolar state. 3. Hypertension.  This patient clearly has risk factors for stroke, appears to have suffered a small right brain stroke.  The patient is not a t-PA candidate due to minimal deficit and duration of symptoms.  The patient will be admitted for further stroke workup.  PLAN: 1. Continue aspirin for now. 2. MRI of the brain. 3. MRI angiogram of the head. 4. Carotid Doppler study. 5. 2D echocardiogram.  We will follow the patient's clinical course     while in-house.  The patient may benefit from physical and occupational therapy.     Marlan Palau, M.D.     CKW/MEDQ  D:  11/07/2010  T:  11/08/2010  Job:  [id number]  cc:   Brooke Bonito, M.D. [facility]  Electronically Signed by Thana Farr M.D. on 11/14/2010 09:58:25 AM

## 2010-11-15 LAB — CBC
HCT: 21.6 % — ABNORMAL LOW (ref 39.0–52.0)
HCT: 28.7 % — ABNORMAL LOW (ref 39.0–52.0)
Hemoglobin: 7.5 g/dL — ABNORMAL LOW (ref 13.0–17.0)
Hemoglobin: 9.9 g/dL — ABNORMAL LOW (ref 13.0–17.0)
MCH: 31.9 pg (ref 26.0–34.0)
MCH: 31.5 pg (ref 26.0–34.0)
MCHC: 34.7 g/dL (ref 30.0–36.0)
MCHC: 34.5 g/dL (ref 30.0–36.0)
MCV: 91.9 fL (ref 78.0–100.0)
MCV: 91.4 fL (ref 78.0–100.0)
Platelets: 153 10*3/uL (ref 150–400)
Platelets: 148 10*3/uL — ABNORMAL LOW (ref 150–400)
RBC: 2.35 MIL/uL — ABNORMAL LOW (ref 4.22–5.81)
RBC: 3.14 MIL/uL — ABNORMAL LOW (ref 4.22–5.81)
RDW: 15.7 % — ABNORMAL HIGH (ref 11.5–15.5)
RDW: 16.5 % — ABNORMAL HIGH (ref 11.5–15.5)
WBC: 11.5 10*3/uL — ABNORMAL HIGH (ref 4.0–10.5)
WBC: 11.1 10*3/uL — ABNORMAL HIGH (ref 4.0–10.5)

## 2010-11-15 LAB — GLUCOSE, CAPILLARY
Glucose-Capillary: 133 mg/dL — ABNORMAL HIGH (ref 70–99)
Glucose-Capillary: 122 mg/dL — ABNORMAL HIGH (ref 70–99)
Glucose-Capillary: 190 mg/dL — ABNORMAL HIGH (ref 70–99)
Glucose-Capillary: 236 mg/dL — ABNORMAL HIGH (ref 70–99)

## 2010-11-15 LAB — COMPREHENSIVE METABOLIC PANEL
ALT: 14 U/L (ref 0–53)
AST: 18 U/L (ref 0–37)
Albumin: 2.2 g/dL — ABNORMAL LOW (ref 3.5–5.2)
Alkaline Phosphatase: 22 U/L — ABNORMAL LOW (ref 39–117)
BUN: 42 mg/dL — ABNORMAL HIGH (ref 6–23)
CO2: 24 mEq/L (ref 19–32)
Calcium: 8.1 mg/dL — ABNORMAL LOW (ref 8.4–10.5)
Chloride: 112 mEq/L (ref 96–112)
Creatinine, Ser: 1.25 mg/dL (ref 0.4–1.5)
GFR calc Af Amer: >60 mL/min (ref >60)
GFR calc non Af Amer: 55 mL/min — ABNORMAL LOW (ref >60)
Glucose, Bld: 100 mg/dL — ABNORMAL HIGH (ref 70–99)
Potassium: 3 mEq/L — ABNORMAL LOW (ref 3.5–5.1)
Sodium: 142 mEq/L (ref 135–145)
Total Bilirubin: 0.5 mg/dL (ref 0.3–1.2)
Total Protein: 3.9 g/dL — ABNORMAL LOW (ref 6.0–8.3)

## 2010-11-15 LAB — C-PEPTIDE: C-Peptide: 1.69 ng/mL (ref 0.80–3.90)

## 2010-11-16 LAB — CROSSMATCH
ABO/RH(D): A POS
Antibody Screen: POSITIVE
DAT, IgG: NEGATIVE
PT AG Type: NEGATIVE
Unit division: 0
Unit division: 0
Unit division: 0
Unit division: 0
Unit division: 0
Unit division: 0

## 2010-11-16 LAB — GLUCOSE, CAPILLARY
Glucose-Capillary: 199 mg/dL — ABNORMAL HIGH (ref 70–99)
Glucose-Capillary: 100 mg/dL — ABNORMAL HIGH (ref 70–99)
Glucose-Capillary: 163 mg/dL — ABNORMAL HIGH (ref 70–99)
Glucose-Capillary: 186 mg/dL — ABNORMAL HIGH (ref 70–99)

## 2010-11-16 LAB — CBC
HCT: 26.1 % — ABNORMAL LOW (ref 39.0–52.0)
Hemoglobin: 9.1 g/dL — ABNORMAL LOW (ref 13.0–17.0)
MCH: 31.3 pg (ref 26.0–34.0)
MCHC: 34.9 g/dL (ref 30.0–36.0)
MCV: 89.7 fL (ref 78.0–100.0)
Platelets: 176 10*3/uL (ref 150–400)
RBC: 2.91 MIL/uL — ABNORMAL LOW (ref 4.22–5.81)
RDW: 16.3 % — ABNORMAL HIGH (ref 11.5–15.5)
WBC: 9.9 10*3/uL (ref 4.0–10.5)

## 2010-11-16 LAB — BASIC METABOLIC PANEL
BUN: 21 mg/dL (ref 6–23)
CO2: 24 mEq/L (ref 19–32)
Calcium: 8.3 mg/dL — ABNORMAL LOW (ref 8.4–10.5)
Chloride: 111 mEq/L (ref 96–112)
Creatinine, Ser: 1.12 mg/dL (ref 0.4–1.5)
GFR calc Af Amer: >60 mL/min (ref >60)
GFR calc non Af Amer: >60 mL/min (ref >60)
Glucose, Bld: 165 mg/dL — ABNORMAL HIGH (ref 70–99)
Potassium: 3.9 mEq/L (ref 3.5–5.1)
Sodium: 141 mEq/L (ref 135–145)

## 2010-11-17 LAB — CBC
HCT: 27.8 % — ABNORMAL LOW (ref 39.0–52.0)
Hemoglobin: 9.3 g/dL — ABNORMAL LOW (ref 13.0–17.0)
MCH: 31 pg (ref 26.0–34.0)
MCHC: 33.5 g/dL (ref 30.0–36.0)
MCV: 92.7 fL (ref 78.0–100.0)
Platelets: 172 10*3/uL (ref 150–400)
RBC: 3 MIL/uL — ABNORMAL LOW (ref 4.22–5.81)
RDW: 17.1 % — ABNORMAL HIGH (ref 11.5–15.5)
WBC: 8.4 10*3/uL (ref 4.0–10.5)

## 2010-11-17 LAB — GLUCOSE, CAPILLARY
Glucose-Capillary: 190 mg/dL — ABNORMAL HIGH (ref 70–99)
Glucose-Capillary: 181 mg/dL — ABNORMAL HIGH (ref 70–99)
Glucose-Capillary: 198 mg/dL — ABNORMAL HIGH (ref 70–99)

## 2010-11-18 LAB — GLUCOSE, CAPILLARY
Glucose-Capillary: 102 mg/dL — ABNORMAL HIGH (ref 70–99)
Glucose-Capillary: 166 mg/dL — ABNORMAL HIGH (ref 70–99)
Glucose-Capillary: 75 mg/dL (ref 70–99)
Glucose-Capillary: 232 mg/dL — ABNORMAL HIGH (ref 70–99)
Glucose-Capillary: 216 mg/dL — ABNORMAL HIGH (ref 70–99)
Glucose-Capillary: 210 mg/dL — ABNORMAL HIGH (ref 70–99)

## 2010-11-18 LAB — CBC
HCT: 29.6 % — ABNORMAL LOW (ref 39.0–52.0)
Hemoglobin: 10 g/dL — ABNORMAL LOW (ref 13.0–17.0)
MCH: 31.1 pg (ref 26.0–34.0)
MCHC: 33.8 g/dL (ref 30.0–36.0)
MCV: 91.9 fL (ref 78.0–100.0)
Platelets: 213 10*3/uL (ref 150–400)
RBC: 3.22 MIL/uL — ABNORMAL LOW (ref 4.22–5.81)
RDW: 16.7 % — ABNORMAL HIGH (ref 11.5–15.5)
WBC: 10.1 10*3/uL (ref 4.0–10.5)

## 2010-11-19 LAB — CBC
HCT: 28.5 % — ABNORMAL LOW (ref 39.0–52.0)
Hemoglobin: 9.5 g/dL — ABNORMAL LOW (ref 13.0–17.0)
MCH: 31 pg (ref 26.0–34.0)
MCHC: 33.3 g/dL (ref 30.0–36.0)
MCV: 93.1 fL (ref 78.0–100.0)
Platelets: 247 10*3/uL (ref 150–400)
RBC: 3.06 MIL/uL — ABNORMAL LOW (ref 4.22–5.81)
RDW: 16.7 % — ABNORMAL HIGH (ref 11.5–15.5)
WBC: 12.6 10*3/uL — ABNORMAL HIGH (ref 4.0–10.5)

## 2010-11-19 LAB — BASIC METABOLIC PANEL
BUN: 14 mg/dL (ref 6–23)
CO2: 23 mEq/L (ref 19–32)
Calcium: 8.9 mg/dL (ref 8.4–10.5)
Chloride: 106 mEq/L (ref 96–112)
Creatinine, Ser: 1.02 mg/dL (ref 0.4–1.5)
GFR calc Af Amer: >60 mL/min (ref >60)
GFR calc non Af Amer: >60 mL/min (ref >60)
Glucose, Bld: 142 mg/dL — ABNORMAL HIGH (ref 70–99)
Potassium: 4.4 mEq/L (ref 3.5–5.1)
Sodium: 137 mEq/L (ref 135–145)

## 2010-11-19 LAB — GLUCOSE, CAPILLARY
Glucose-Capillary: 138 mg/dL — ABNORMAL HIGH (ref 70–99)
Glucose-Capillary: 253 mg/dL — ABNORMAL HIGH (ref 70–99)
Glucose-Capillary: 198 mg/dL — ABNORMAL HIGH (ref 70–99)
Glucose-Capillary: 195 mg/dL — ABNORMAL HIGH (ref 70–99)

## 2010-11-20 LAB — CBC
HCT: 28.7 % — ABNORMAL LOW (ref 39.0–52.0)
Hemoglobin: 9.4 g/dL — ABNORMAL LOW (ref 13.0–17.0)
MCH: 30.5 pg (ref 26.0–34.0)
MCHC: 32.8 g/dL (ref 30.0–36.0)
MCV: 93.2 fL (ref 78.0–100.0)
Platelets: 315 10*3/uL (ref 150–400)
RBC: 3.08 MIL/uL — ABNORMAL LOW (ref 4.22–5.81)
RDW: 16.2 % — ABNORMAL HIGH (ref 11.5–15.5)
WBC: 12.2 10*3/uL — ABNORMAL HIGH (ref 4.0–10.5)

## 2010-11-20 LAB — GLUCOSE, CAPILLARY
Glucose-Capillary: 119 mg/dL — ABNORMAL HIGH (ref 70–99)
Glucose-Capillary: 168 mg/dL — ABNORMAL HIGH (ref 70–99)

## 2010-11-20 NOTE — Discharge Summary (Signed)
NAMELANDERS, PRAJAPATI                  ACCOUNT NO.:  [id number]  MEDICAL RECORD NO.:  [id number]           PATIENT TYPE:  I  LOCATION:  3016                         FACILITY:  [facility]  PHYSICIAN:  Tera Mater. Evlyn Kanner, M.D. DATE OF BIRTH:  1924-01-06  DATE OF ADMISSION:  11/12/2010 DATE OF DISCHARGE:  11/20/2010                              DISCHARGE SUMMARY   DISCHARGE DIAGNOSES: 1. Severe gastrointestinal bleed due to duodenal ulcer, clinically     improved with stable hemoglobin. 2. Recent stroke with temporary discontinuation of antiplatelet agents     with good recovery thus far 3. Type 2 diabetes with poor prehospital control and good control at     the present. 4. Distant history of gout. 5. Hypertension with stable levels. 6. Hyperlipidemia. 7. Carotid disease, noncritical on the right.  CONSULTATIONS:  Gastroenterology, he was actually seen promptly on November 13, 2010.  PROCEDURES:  Included an endoscopy with a visible vessel and cauterization.  He also had 4 units of blood transfusion, two units on different occasions for a total of 4 units.  HOSPITAL COURSE:  Mr. Brunetto is an 77 year old white male patient admitted by the hospitalist service on November 12, 2010.  He had excellent care prior to my involvement in the case.  I was involved at the request of the family and saw him first on November 14, 2010.  The patient has done well in the last several days.  He did drop his hemoglobin again on the 16th and needed some more transfusions.  He has had no more bleeding since.  He had cleared a lot of old blood out of his colon.  It is important that we did see a clear cause of this that was cauterized and this plus the combination of Plavix and aspirin probably caused the GI bleed.  This is his first ever GI issue.  In fact up until the last hospitalization, he had not been in the hospital as an adult.  His neurologic status has remained stable and actually improving during  this hospitalization.  His hemoglobin A1c was elevated at 10% indicating inadequate prehospital control and now he is on insulin and doing much better.  He does have a easily measurable C-peptide that would suggest he could be transitioned to oral agents with or without insulin at later point.  He is doing well from a physical therapy standpoint.  He has been ambulating with the walker at a fairly rapid speech.  He has also been using the putty in his left hand to improve the strength on the Stroke side.  He has had no new neurologic problems while here.  Overall, he is in good spirits.  His diet has been advanced. He is eating well.  His blood pressure this morning is 146/64, pulse 90, respirations 16, temperature 97.3, O2 sat 99% on room air.  He has no pain.  He slept well.  His labs this morning, white count is 12,200, hemoglobin 9.4, MCV 93.2, platelets 315,000.  His sodium is 137, potassium 4.4, chloride 106, CO2 of 23, BUN 14, creatinine 1.02, glucose 142, calcium of  8.9, estimated GFR greater than 60.  At presentation during this hospitalization, his white count was 8.7 and he dropped down to 7.9 prior transfusion.  He popped back up and then dropped down on the 15 to 7.5 and has remained stable since.  Cardiac markers on the 14th showed troponin of 0.03.  His magnesium was fine at 1.8, phosphorous is fine at 3.2.  Another set of cardiac enzymes did also showed troponin of 0.03.  A hemoglobin A1c was elevated at 10% with an estimated mean plasma glucose of 240.  PCR screen for MRSA was negative and at no time did his CPK go up.  INR was 1.18 in hospitalization.  RADIOLOGY TESTING:  A chest x-ray on the 12 showed no acute cardiopulmonary findings.  Importantly on the 9th before he was in the last hospitalization, there was some intracranial atherosclerotic changes and 40% to 59% right coronary stenosis by ultrasound.  A few small infarcts in the border of the right central sulcus  right periatrial region were noted.  In summary, we have an 77 year old white male with back-to-back admissions first with a stroke.  He was making some recovery, was on dual antiplatelet and presented now with a GI bleed due to a duodenal ulcer.  The patient had a somewhat of a prolonged hospitalization due to recurrent need for transfusion and procedures.  He is now done well and has been advanced in his diet and much improved.  At discharge, he will resume enalapril 10 mg once daily.  His allopurinol was stopped.  He had been on some Ensure and he is eating well enough, I do not think he needs that.  He is on some MiraLax 17 grams once daily now.  He is on fenofibrate which is Trilipix 135 mg once daily.  He is on Crestor 20 mg once daily.  His insulin dose of Lantus has been 12 units twice daily and he is on a sliding scale plus a set dose.  At the current time, he has required 4 units with each meal and then added to that is a sliding scale based on the sugar with 3 units starting at 121-150, four units if 151-200, seven units if 201- 250, and 11 units if over 251.  The C-peptide does suggest we might be able to use other agents down the road, but at the moment insulin will be the treatment.  His Plavix is on hold and the decision on this is uncertain.  The thought is perhaps to be restart this in 2 weeks depending on his clinical status.  Right now, he is on Protonix 40 twice a day and I think I would do that for least another month.  He is going to a skilled nursing facility where a PT and OT will then taken.  His diet is no concentrated sweets.  His sugars are before meals and at bed with a sliding scale applied before meals only.  He is a full code at this time.  He has followup with Dr. Pearlean Brownie and his daughter will set this up.  He will be under the care of Dr. Royanne Foots at the [facility]. He will come to see me when he is discharged from there.  I recommend checking the  hemoglobin 4-5 days from now and then weekly for a couple of weeks.  He probably needs an iron level at some time, but he has had 4 units of blood and received fair amount of iron with that.  I am not starting on an iron supplement at the present time.  He is expected to do well.  Please call for any questions.          ______________________________ Tera Mater Evlyn Kanner, M.D.     SAS/MEDQ  D:  11/20/2010  T:  11/20/2010  Job:  [id number]  Electronically Signed by Adrian Prince M.D. on 11/20/2010 24:40:10 PM

## 2010-11-22 NOTE — Consult Note (Signed)
NAMEBLAND, RUDZINSKI                  ACCOUNT NO.:  [id number]  MEDICAL RECORD NO.:  [id number]           PATIENT TYPE:  I  LOCATION:  3312                         FACILITY:  [facility]  PHYSICIAN:  Hibah Odonnell L. Malon Kindle., M.D.DATE OF BIRTH:  31-Jan-1924  DATE OF CONSULTATION:  11/13/2010 DATE OF DISCHARGE:                                CONSULTATION   REFERRING SERVICE:  [facility].  PRIMARY CARE PHYSICIAN:  Brooke Bonito, MD  REASON FOR CONSULTATION:  GI bleeding.  HISTORY:  The patient is an 77 year old gentleman who was hospitalized earlier this month with a CVA.  He was hospitalized for 3 days from February 7 to November 10, 2010.  He had several small infarcts on the right side of his brain.  He also has uncontrolled diabetes and hypertension.  The patient does have a history of reflux disease and gout as well.  MRI showed the small strokes to be nonhemorrhagic and felt to be ischemic in origin.  He was discharged on aspirin and Plavix. The patient was readmitted with melenic stools.  He has had heartburn in the past, has been on Nexium and other medications and actually was taking Protonix on his admission.  He has never had an endoscopy and never had known ulcerations.  His hemoglobin was 15 on November 08, 2010, it was 8.7 this admission with melenic stools and has dropped to 7.9 after transfusion.  His Pro-time is 15.  BUN is 59 and creatinine 1.4. Liver tests are fairly unremarkable.  The patient has had previous colonoscopies done by Dr. Virginia Rochester in the past.  He said about 10 years ago that was normal.  He has not had any bright red blood or hematochezia.  MEDICATIONS ON ADMISSION:  Protonix, enalapril, allopurinol, Crestor, Trilipix, promethazine, insulin, and acetaminophen.  DRUG ALLERGIES:  None.  PAST MEDICAL HISTORY:  Diabetes and hypertension with recent CVA.  He has had gout and GERD.  He has had cataract surgery.  Colonoscopy was done in 2005 that showed severe  diverticular disease but no polyps.  The patient apparently had an upper endoscopy at that time, although he does not remember it.  There was a question of Barrett esophagus with erosions and ulcerations in the duodenal bulb.  This showed some ulceration in the esophagus and presumably that is why the patient was started on Protonix.  FAMILY HISTORY:  Noncontributory.  PHYSICAL EXAMINATION:  VITAL SIGNS:  Temperature 97.7, pulse 115, and blood pressure 133/70. GENERAL:  Alert white male lying in hospital bed. EYES:  Sclerae nonicteric. NECK:  Supple. LUNGS:  Clear. HEART:  Regular rate and rhythm without murmurs or gallops. ABDOMEN:  Soft and nontender.  ASSESSMENT:  Gastrointestinal bleed with elevated BUN and creatinine being normal, it is very likely an upper gastrointestinal source.  The patient has had previous esophagitis on EGD in the past by Dr. Virginia Rochester.  PLAN:  I agree with holding his aspirin and Plavix and I think we need to go ahead with an endoscopy and control the bleeding this afternoon. I have discussed this in detail with the patient's daughter.  ______________________________ Llana Aliment Malon Kindle., M.D.     Waldron Session  D:  11/13/2010  T:  11/14/2010  Job:  [id number]  cc:   Brooke Bonito, M.D.  Electronically Signed by Carman Ching M.D. on 11/22/2010 01:51:31 PM

## 2010-11-22 NOTE — Op Note (Signed)
NAMEJAYLUN, FLEENER                  ACCOUNT NO.:  [id number]  MEDICAL RECORD NO.:  [id number]           PATIENT TYPE:  LOCATION:                                 FACILITY:  PHYSICIAN:  Raneem Mendolia L. Malon Kindle., M.D.DATE OF BIRTH:  1924-07-09  DATE OF PROCEDURE:  11/13/2010 DATE OF DISCHARGE:                              OPERATIVE REPORT   PROCEDURE:  Esophagogastroduodenoscopy with control of bleeding.  MEDICATIONS:  Hurricaine spray, fentanyl 50 mcg, Versed 4 mg IV, epinephrine 2 mL of 1:10,000.  INDICATIONS:  The patient recently was hospitalized with a stroke and was discharged on aspirin and Plavix.  He came back in with melena with markedly increased BUN and normal creatinine.  His hemoglobin has dropped despite 2 units.  The procedure was done to identify bleeding source and to control it if possible.  DESCRIPTION OF PROCEDURE:  Procedure has been explained to the patient and consent obtained.  In left lateral decubitus position, the Pentax upper scope was inserted blindly into the esophagus and advanced.  There was a hiatal hernia and erosive esophagitis that was not actively bleeding.  The stomach was entered.  There were some clots and old material in the stomach that were washed and irrigated, this took some time but we were able to clean it out.  There were no signs of active bleeding in the stomach.  The duodenum was entered.  There were erosions and shallow ulcers in the duodenal bulb.  There were old clots and some bright red blood regurgitating.  We passed as far as we could with the scope, and there was actively pumping lesion of some sort, I cannot see it, it was right at the limb end of the upper endoscope.  I put a clip on it as closely as possible.  I then removed the upper endoscope and we inserted the pediatric colonoscope.  We were able to get down to the lesion.  This was a nodular pulsating lesion that was slowed somewhat by the first clip.  We had trouble  due to the amount of bleeding and good look and finally with irrigation and cleaning up, we were able to see this.  It had the appearance of some sort of a nodular small polyp that was pumping blood.  We had to inject around it with 2 mL of epinephrine which did slow it down to the point where it was just oozing.  At this point, I put two clips, one on the other side of it and the other apparently right on it.  At this point, with three EndoClips and the epinephrine, there was no active bleeding at all and the scope was withdrawn.  The patient tolerated the procedure reasonably well and was resting comfortably at the termination of procedure.  ASSESSMENT:  Bleeding nodular lesion in the second duodenum, probably more like the third duodenum.  This bleeding was controlled with EndoClips and epinephrine.  PLAN:  We will keep the patient off aspirin and Plavix for the foreseeable future, continue the Protonix drip, and continue n.p.o. Should his bleeding resume, he will likely need embolic  therapy.  We will follow him with you.       ______________________________ Llana Aliment. Malon Kindle., M.D.     Waldron Session  D:  11/13/2010  T:  11/14/2010  Job:  [id number]  cc:   Brooke Bonito, M.D.  Electronically Signed by Carman Ching M.D. on 11/22/2010 01:51:38 PM

## 2010-12-07 NOTE — Discharge Summary (Signed)
NAMEDARNEL, MCHAN                  ACCOUNT NO.:  [id number]  MEDICAL RECORD NO.:  [id number]           PATIENT TYPE:  I  LOCATION:  3739                         FACILITY:  [facility]  PHYSICIAN:  Kela Millin, M.D.DATE OF BIRTH:  09-08-1924  DATE OF ADMISSION:  11/07/2010 DATE OF DISCHARGE:  11/10/2010                        DISCHARGE SUMMARY - REFERRING   DISCHARGE DIAGNOSES: 1. Acute small infarcts on the border of the right central     sulcus/right parietal region. 2. Uncontrolled diabetes mellitus/hyperosmolar, nonketotic     hyperglycemia. 3. Uncontrolled hypertension. 4. Hyperlipidemia. 5. Gastroesophageal reflux disease. 6. History of gout.  PROCEDURES AND STUDIES: 1. MRI of the brain on November 09, 2010 - acute small infarcts on the     border of the right central sulcus/right parietal region. 2. MRA - intracranial atherosclerotic type changes.  Anterior septal     lesion most notable abnormality involving the middle cerebral     artery branch vessels which are  irregular and narrowed with     decreased number of visualized right MCA vessels consistent with     the patient's acute infarct.  Posterior circulation most notable     abnormality in the appearance of the right vertebral artery which     may be occluded with flow from collaterals. 3. Abdominal x-ray on November 09, 2010 - no acute abdominal findings.     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 4. A 2-D echocardiogram - ejection fraction 60-65%.  Systolic function     normal.  CONSULTATION:  Neurology, Dr. Pearlean Brownie.  BRIEF HISTORY:  The patient is a pleasant 77 year old white male with the above-listed medical problems who presented to the ED with complaints of left-sided weakness.  He came into the ER as a code stroke and was seen by Neurology and they indicated that the patient was not a TPA candidate because of his mild symptoms and he was also out of  the TPA window.  The patient also reported that he had had a generalized malaise few days and stated that he had been on Amaryl for his diabetes and his PCP changed that to Kombiglyze XR 01/999 but he reported having nausea from it and so for 2 weeks stopped taking the medication.  He denied chest pain, palpitations, nausea or vomiting.  In the ED, he had a CT scan of his brain and there was question of a subacute parietal CVA.  His lab work revealed a blood glucose of 600 and an EKG showed sinus tachycardia at 110.  He was admitted for further evaluation and management.  HOSPITAL COURSE: 1. Acute small infarcts on the border of the right central     sulcus/right parietal region - as discussed above upon admission     the patient had a CT scan as well as MRI and the MRI revealed acute     right hemispheric infarct.  Carotid Doppler ultrasound was done     which revealed 40-59% ICA stenosis on the right.  Initially,     Neurology recommended to continue the aspirin that  he has been on.     PT, OT was consulted and saw the patient and recommended skilled     nursing for further rehab.  Neurology followed up with the patient     again and changed his aspirin to Plavix following the above MRI     results.  The patient's left-sided weakness has improved, Neuro     followed up and recommended outpatient followup and the patient     will be discharged to a skilled nursing at this time for further     rehab. 2. Uncontrolled diabetes mellitus/hyperosmolar nonketotic     hyperglycemia - the patient received IV insulin in the ED and his     blood glucose on recheck had improved to 228.  He was placed on     sliding scale insulin with Accu-Chek monitoring.  On followup,     Lantus was added and the dose up titrated for better blood glucose     control.  The oral agent was discontinued during this hospital stay     and he is to continue the Lantus with sliding scale insulin on     discharge.   Follow up with his nursing home physician/outpatient     physician for further monitoring and the oral agents can be     considered on outpatient followup in the future when clinically     appropriate.  His hemoglobin A1c in the hospital was 11.2 and     Nutrition was consulted for education in diabetes diet. 3. Uncontrolled hypertension - his blood pressures were monitored     during this hospital stay and higher blood pressures were tolerated     as acute peristroke.  The patient's Vasotec has been changed to 10     mg a day and nursing home/outpatient physicians to further monitor     his blood pressures for optimal blood glucose control. 4. Hyperlipidemia - the patient had a fasting lipid profile during     this hospital stay revealed a triglyceride of 268, HDL of 29, LDL     47 and total cholesterol of 130.  The patient was maintained on     fenofibric acid as well as Crestor which he is to continue upon     discharge. 5. History of gout - he is to continue allopurinol upon discharge.  DISCHARGE MEDICATIONS:  Please see the medication reconciliation form on the patient's chart for a complete list of his discharge medications. FOLLOWUP CARE:  Nursing home physician in 1-2 days.     Kela Millin, M.D.     ACV/MEDQ  D:  11/10/2010  T:  11/10/2010  Job:  [id number]  Electronically Signed by Donnalee Curry M.D. on 12/05/2010 07:32:31 PM

## 2010-12-11 NOTE — H&P (Signed)
[name], [name] NO.:  [id number]  MEDICAL RECORD NO.:  [id number]           PATIENT TYPE:  E  LOCATION:  [facility]                         FACILITY:  [facility]  PHYSICIAN:  Stuart Siren, MD           DATE OF BIRTH:  Jun 22, 1924  DATE OF ADMISSION:  11/12/2010 DATE OF DISCHARGE:                             HISTORY & PHYSICAL   PRIMARY CARE PHYSICIAN:  Stuart Bonito, MD  ADVANCE DIRECTIVES:  Full code.  REASON FOR ADMISSION:  Upper GI bleed.  HISTORY OF PRESENT ILLNESS:  This is an 77 year old male with history of recent right hemispheric CVA whom I admitted on November 09, 2010, returned to the emergency room complaining of melanotic stool and lightheadedness and was found to have an upper GI bleed.  During his CVA, workup included an MRA which showed narrow right MCA territory and occluded posterior circulation with collaterals, along with a carotid US showing 50-60% stenosis.  He was placed originally on aspirin, but then neurologist had recommended Plavix, and he subsequently was discharged on Plavix.  Workup in the emergency room today showed hemoglobin of 8.0 which is a 4-g/dL drop since discharge.  He is also tachycardic with heart rate of 110, but he has stable blood pressure at systolic of 130.  He maintained hemodynamic stability in the emergency room.  Dr. Evette [name] of GI was consulted, and hospitalist was asked to admit the patient for upper GI bleed.  PAST MEDICAL HISTORY:  Gout, GERD, hypercholesterolemia, hypertension, and diabetes.  SOCIAL HISTORY:  He denied tobacco, alcohol, or drug use.  He lives alone.  FAMILY HISTORY:  Noncontributory.  MEDICATIONS: 1. Protonix 40 mg once a day. 2. Enalapril 10 mg per day. 3. Allopurinol 100 mg once a day. 4. Crestor with 20 mg per day. 5. Trilipix 135 mg per day. 6. Promethazine as needed. 7. Lantus 15 units nightly. 8. Acetaminophen as needed.  ALLERGIES:  No known drug allergies.  PHYSICAL EXAMINATION:   VITAL SIGNS:  Blood pressure 132/75, heart rate of 120, respiratory rate 20, and temperature 98.5. GENERAL:  He is alert and oriented and is in no apparent distress.  He does not look especially pale. HEENT:  Conjunctiva slightly pale, however, he has fluent speech with facial symmetry.  His left strength is significantly better than when I saw him. CARDIAC:  S1 and S2, tachycardia with a 2/6 flow murmur at the left sternal border. LUNGS:  Clear. ABDOMEN:  Soft and nontender.  Bowel sounds present.  No evidence of ascites. EXTREMITIES:  No edema.  He has no calf tenderness. SKIN:  Warm and dry.  He had good pounding distal pulses bilaterally. PSYCHIATRIC:  Unremarkable as well.  OBJECTIVE FINDINGS:  INR of 1.1.  Occult blood is positive.  Chest x-ray showed no acute cardiopulmonary findings.  Urinalysis is negative. Potassium of 4.8, glucose of 407, BUN of 59, and creatinine of 1.4. Troponin less than 0.05.  white count of 17,600, hemoglobin of 8.7, MCV of 94, and platelet count 251,000.  IMPRESSION:  This is an 77 year old male with recent cerebrovascular  accident, on Plavix, who presents with upper gastrointestinal bleed.  He did have a brisk bleed with a 4-g drop of hemoglobin, and I will admit him to the Step-[facility].  We will transfuse him 2 units of packed red blood cells over 3 hours each.  We will obviously hold Plavix.  I would like to start him on a Protonix drip.  I suspect that his tachycardia is directly related to his anemia and not just volume depletion alone.  We will continue his other medications once reconciled.  For his blood glucose, we will put him on a moderate insulin sliding scale.  He is a full code and will be admitted to [facility].  Dr. Evette [name] is aware of the patient's admission and will see in consultation.  He will be made n.p.o. as he likely will need an upper endoscopy.     Stuart Siren, MD     PL/MEDQ  D:  11/13/2010  T:  11/13/2010  Job:   [id number]  cc:   [name], M.D.  Electronically Signed by [name]  on 12/11/2010 05:06:30 AM

## 2010-12-12 DIAGNOSIS — Z8673 Personal history of transient ischemic attack (TIA), and cerebral infarction without residual deficits: Secondary | ICD-10-CM

## 2011-07-10 ENCOUNTER — Other Ambulatory Visit: Payer: Self-pay | Admitting: Dermatology

## 2011-12-17 ENCOUNTER — Emergency Department (INDEPENDENT_AMBULATORY_CARE_PROVIDER_SITE_OTHER): Payer: Medicare Other

## 2011-12-17 ENCOUNTER — Emergency Department (HOSPITAL_BASED_OUTPATIENT_CLINIC_OR_DEPARTMENT_OTHER)
Admission: EM | Admit: 2011-12-17 | Discharge: 2011-12-17 | Disposition: A | Discharge status: Home / Self Care | Payer: Medicare Other | Attending: Emergency Medicine | Admitting: Emergency Medicine

## 2011-12-17 ENCOUNTER — Encounter (HOSPITAL_BASED_OUTPATIENT_CLINIC_OR_DEPARTMENT_OTHER): Payer: Self-pay

## 2011-12-17 DIAGNOSIS — R51 Headache: Secondary | ICD-10-CM

## 2011-12-17 DIAGNOSIS — IMO0002 Reserved for concepts with insufficient information to code with codable children: Secondary | ICD-10-CM | POA: Insufficient documentation

## 2011-12-17 DIAGNOSIS — W19XXXA Unspecified fall, initial encounter: Secondary | ICD-10-CM

## 2011-12-17 DIAGNOSIS — R220 Localized swelling, mass and lump, head: Secondary | ICD-10-CM

## 2011-12-17 DIAGNOSIS — W010XXA Fall on same level from slipping, tripping and stumbling without subsequent striking against object, initial encounter: Secondary | ICD-10-CM | POA: Insufficient documentation

## 2011-12-17 DIAGNOSIS — S0081XA Abrasion of other part of head, initial encounter: Secondary | ICD-10-CM

## 2011-12-17 DIAGNOSIS — I10 Essential (primary) hypertension: Secondary | ICD-10-CM | POA: Insufficient documentation

## 2011-12-17 DIAGNOSIS — K219 Gastro-esophageal reflux disease without esophagitis: Secondary | ICD-10-CM | POA: Insufficient documentation

## 2011-12-17 DIAGNOSIS — S0083XA Contusion of other part of head, initial encounter: Secondary | ICD-10-CM

## 2011-12-17 DIAGNOSIS — S0003XA Contusion of scalp, initial encounter: Secondary | ICD-10-CM | POA: Insufficient documentation

## 2011-12-17 DIAGNOSIS — S0990XA Unspecified injury of head, initial encounter: Secondary | ICD-10-CM

## 2011-12-17 DIAGNOSIS — Z8679 Personal history of other diseases of the circulatory system: Secondary | ICD-10-CM | POA: Insufficient documentation

## 2011-12-17 DIAGNOSIS — E785 Hyperlipidemia, unspecified: Secondary | ICD-10-CM | POA: Insufficient documentation

## 2011-12-17 HISTORY — DX: Hyperlipidemia, unspecified: E78.5

## 2011-12-17 HISTORY — DX: Essential (primary) hypertension: I10

## 2011-12-17 HISTORY — DX: Gastro-esophageal reflux disease without esophagitis: K21.9

## 2011-12-17 NOTE — ED Notes (Signed)
Pt reports he tripped and fell while ambulating striking face on floor.  Denies LOC.  Lac over left brow and abrasion to left hand.

## 2011-12-17 NOTE — ED Notes (Signed)
Secondary Assessment- Pt reports he tripped and fell while ambulating striking face on floor.  Denies LOC.  Small linear laceration over left brow and abrasion to dorsal aspect of left hand.  Bleeding is controlled.

## 2011-12-17 NOTE — Discharge Instructions (Signed)
Abrasions Abrasions are skin scrapes. Their treatment depends on how large and deep the abrasion is. Abrasions do not extend through all layers of the skin. A cut or lesion through all skin layers is called a laceration. HOME CARE INSTRUCTIONS   If you were given a dressing, change it at least once a day or as instructed by your caregiver. If the bandage sticks, soak it off with a solution of water or hydrogen peroxide.   Twice a day, wash the area with soap and water to remove all the cream/ointment. You may do this in a sink, under a tub faucet, or in a shower. Rinse off the soap and pat dry with a clean towel. Look for signs of infection (see below).   Reapply cream/ointment according to your caregiver's instruction. This will help prevent infection and keep the bandage from sticking. Telfa or gauze over the wound and under the dressing or wrap will also help keep the bandage from sticking.   If the bandage becomes wet, dirty, or develops a foul smell, change it as soon as possible.   Only take over-the-counter or prescription medicines for pain, discomfort, or fever as directed by your caregiver.  SEEK IMMEDIATE MEDICAL CARE IF:   Increasing pain in the wound.   Signs of infection develop: redness, swelling, surrounding area is tender to touch, or pus coming from the wound.   You have a fever.   Any foul smell coming from the wound or dressing.  Most skin wounds heal within ten days. Facial wounds heal faster. However, an infection may occur despite proper treatment. You should have the wound checked for signs of infection within 24 to 48 hours or sooner if problems arise. If you were not given a wound-check appointment, look closely at the wound yourself on the second day for early signs of infection listed above. MAKE SURE YOU:   Understand these instructions.   Will watch your condition.   Will get help right away if you are not doing well or get worse.  Document Released:  06/27/2005 Document Revised: 09/06/2011 Document Reviewed: 08/21/2011 [facility] Patient Information 2012 [place], [place].Head Injury, Adult A head injury happens when the head is hit really hard. A head injury may cause sleepiness, headache, throwing up (vomiting), and problems seeing. If the head injury is really bad, you may need to stay in the hospital. HOME CARE  Have someone with you for the first 24 hours. This person should wake you up every 1 hour to check on your condition.   Only drink water or clear fluid for the rest of the day. Then, go back to your regular diet.   Only take medicines as told by your doctor. Do not take aspirin.   Do not drink alcohol for 2 days.   Do not take medicines that help your relax (sedatives) for 2 days.  Side effects may happen for up to 7 to 10 days. Watch for new problems. GET HELP RIGHT AWAY IF:   You are confused or sleepy.   You cannot be woken up.   You feel sick to your stomach (nauseous) or keep throwing up.   Your dizziness or unsteadiness is get worse, or your cannot walk.   You start to shake (convulse) or pass out (faint).   You have very bad, lasting headaches that are not helped by medicine.   You cannot use your arms or legs like normal.   You have clear or bloody fluid coming from your nose  or ears.  MAKE SURE YOU:   Understand these instructions.   Will watch your condition.   Will get help right away if you are not doing well or get worse.  Document Released: 08/30/2008 Document Revised: 09/06/2011 Document Reviewed: 08/03/2009 [facility] Patient Information 2012 [place], [place].

## 2011-12-17 NOTE — ED Notes (Signed)
Patient reports he was walking this am around 1030 and fell; denies LOC.  Ambulatory to room 3.

## 2011-12-17 NOTE — ED Provider Notes (Signed)
History     CSN: [id number]  Arrival date & time 12/17/11  1125   First MD Initiated Contact with Patient 12/17/11 1206      Chief Complaint  Patient presents with  . Fall  . Facial Laceration    (Consider location/radiation/quality/duration/timing/severity/associated sxs/prior treatment) HPI Comments: Pt was walking, not paying attention to where he was going, tripped, hit head on ground.  No LOC, has abrasion to forehead.  No neck or back pain.  Patient is a 78 y.o. male presenting with fall. The history is provided by the patient.  Fall The accident occurred 1 to 2 hours ago. The fall occurred while walking. He landed on a hard floor. The volume of blood lost was minimal. The point of impact was the head. The pain is present in the head. The pain is mild. He was ambulatory at the scene. Pertinent negatives include no visual change, no fever, no numbness, no abdominal pain, no nausea, no vomiting, no hematuria, no headaches, no loss of consciousness and no tingling.    Past Medical History  Diagnosis Date  . Hyperlipemia   . GERD (gastroesophageal reflux disease)   . CVA (cerebral infarction)   . Hypertension     No past surgical history on file.  No family history on file.  History  Substance Use Topics  . Smoking status: Never Smoker   . Smokeless tobacco: Never Used  . Alcohol Use: No      Review of Systems  Constitutional: Negative for fever, chills, diaphoresis and fatigue.  HENT: Negative for congestion, rhinorrhea and sneezing.   Eyes: Negative.   Respiratory: Negative for cough, chest tightness and shortness of breath.   Cardiovascular: Negative for chest pain and leg swelling.  Gastrointestinal: Negative for nausea, vomiting, abdominal pain, diarrhea and blood in stool.  Genitourinary: Negative for frequency, hematuria, flank pain and difficulty urinating.  Musculoskeletal: Negative for back pain and arthralgias.  Skin: Positive for wound. Negative for  rash.  Neurological: Negative for dizziness, tingling, loss of consciousness, speech difficulty, weakness, numbness and headaches.    Allergies  Review of patient's allergies indicates no known allergies.  Home Medications   Current Outpatient Rx  Name Route Sig Dispense Refill  . ASPIRIN 81 MG PO TABS Oral Take 81 mg by mouth daily.    . CHOLINE FENOFIBRATE 135 MG PO CPDR Oral Take 135 mg by mouth daily.    . ENALAPRIL MALEATE 10 MG PO TABS Oral Take 10 mg by mouth daily.    Marland Kitchen PANTOPRAZOLE SODIUM 40 MG PO TBEC Oral Take 40 mg by mouth daily.    Marland Kitchen ROSUVASTATIN CALCIUM 20 MG PO TABS Oral Take 20 mg by mouth daily.      BP 150/72  Pulse 77  Temp(Src) 98 F (36.7 C) (Oral)  Resp 20  Ht 5\' 9"  (1.753 m)  Wt 175 lb (79.379 kg)  BMI 25.84 kg/m2  SpO2 100%  Physical Exam  Constitutional: He is oriented to person, place, and time. He appears well-developed and well-nourished.  HENT:  Head: Normocephalic and atraumatic.       Small abrasion to left forehead.  Eyes: Pupils are equal, round, and reactive to light.  Neck: Normal range of motion. Neck supple.       No pain to neck or back along spine  Cardiovascular: Normal rate, regular rhythm and normal heart sounds.   Pulmonary/Chest: Effort normal and breath sounds normal. No respiratory distress. He has no wheezes. He has no rales.  He exhibits no tenderness.  Abdominal: Soft. Bowel sounds are normal. There is no tenderness. There is no rebound and no guarding.  Musculoskeletal: Normal range of motion. He exhibits no edema.  Lymphadenopathy:    He has no cervical adenopathy.  Neurological: He is alert and oriented to person, place, and time.  Skin: Skin is warm and dry. No rash noted.  Psychiatric: He has a normal mood and affect.    ED Course  Procedures (including critical care time)  Results for orders placed during the hospital encounter of 11/12/10  CBC      Component Value Range   WBC 17.6 (*) 4.0 - 10.5 (K/uL)   RBC  2.60 (*) 4.22 - 5.81 (MIL/uL)   Hemoglobin 8.7 (*) 13.0 - 17.0 (g/dL)   HCT 16.1 (*) 09.6 - 52.0 (%)   MCV 93.8  78.0 - 100.0 (fL)   MCH 33.5  26.0 - 34.0 (pg)   MCHC 35.7  30.0 - 36.0 (g/dL)   RDW 04.5  40.9 - 81.1 (%)   Platelets 251  150 - 400 (K/uL)  POCT CARDIAC MARKERS      Component Value Range   Myoglobin, poc 116  12 - 200 (ng/mL)   CKMB, poc 1.1  1.0 - 8.0 (ng/mL)   Troponin i, poc <0.05  0.00 - 0.09 (ng/mL)   Comment       Value:            TROPONIN VALUES IN THE RANGE     OF 0.00-0.09 ng/mL SHOW     NO INDICATION OF     MYOCARDIAL INJURY.                PERSISTENTLY INCREASED TROPONIN     VALUES IN THE RANGE OF 0.10-0.24     ng/mL CAN BE SEEN IN:           -UNSTABLE ANGINA           -CONGESTIVE HEART FAILURE           -MYOCARDITIS           -CHEST TRAUMA           -ARRYHTHMIAS           -LATE PRESENTING MI           -COPD       CLINICAL FOLLOW-UP RECOMMENDED.                TROPONIN VALUES >=0.25 ng/mL     INDICATE POSSIBLE MYOCARDIAL     ISCHEMIA. SERIAL TESTING     RECOMMENDED.  POCT I-STAT 3, BLOOD GAS (G3P V)      Component Value Range   pH, Ven 7.398 (*) 7.250 - 7.300    pCO2, Ven 34.0 (*) 45.0 - 50.0 (mmHg)   pO2, Ven 23.0 (*) 30.0 - 45.0 (mmHg)   Bicarbonate 21.0  20.0 - 24.0 (mEq/L)   TCO2 22  0 - 100 (mmol/L)   O2 Saturation 41.0     Acid-base deficit 3.0 (*) 0.0 - 2.0 (mmol/L)   Sample type VENOUS     Comment NOTIFIED PHYSICIAN    COMPREHENSIVE METABOLIC PANEL      Component Value Range   Sodium 136  135 - 145 (mEq/L)   Potassium 4.8  3.5 - 5.1 (mEq/L)   Chloride 104  96 - 112 (mEq/L)   CO2 21  19 - 32 (mEq/L)   Glucose, Bld 407 (*) 70 - 99 (mg/dL)   BUN  59 (*) 6 - 23 (mg/dL)   Creatinine, Ser 1.61  0.4 - 1.5 (mg/dL)   Calcium 8.8  8.4 - 09.6 (mg/dL)   Total Protein 4.9 (*) 6.0 - 8.3 (g/dL)   Albumin 2.8 (*) 3.5 - 5.2 (g/dL)   AST 21  0 - 37 (U/L)   ALT 20  0 - 53 (U/L)   Alkaline Phosphatase 27 (*) 39 - 117 (U/L)   Total Bilirubin  0.4  0.3 - 1.2 (mg/dL)   GFR calc non Af Amer 48 (*) >60 (mL/min)   GFR calc Af Amer   (*) >60 (mL/min)   Value: 58            The eGFR has been calculated     using the MDRD equation.     This calculation has not been     validated in all clinical     situations.     eGFR's persistently     <60 mL/min signify     possible Chronic Kidney Disease.  URINALYSIS, ROUTINE W REFLEX MICROSCOPIC      Component Value Range   Color, Urine YELLOW  YELLOW    APPearance CLEAR  CLEAR    Specific Gravity, Urine 1.022  1.005 - 1.030    pH 5.5  5.0 - 8.0    Urine Glucose, Fasting >1000 (*) NEGATIVE (mg/dL)   Hgb urine dipstick NEGATIVE  NEGATIVE    Bilirubin Urine NEGATIVE  NEGATIVE    Ketones, ur 15 (*) NEGATIVE (mg/dL)   Protein, ur NEGATIVE  NEGATIVE (mg/dL)   Urobilinogen, UA 0.2  0.0 - 1.0 (mg/dL)   Nitrite NEGATIVE  NEGATIVE    Leukocytes, UA NEGATIVE  NEGATIVE   URINE MICROSCOPIC-ADD ON      Component Value Range   Squamous Epithelial / LPF RARE  RARE    WBC, UA 0-2  <3 (WBC/hpf)   RBC / HPF 0-2  <3 (RBC/hpf)   Bacteria, UA FEW (*) RARE   OCCULT BLOOD, POC      Component Value Range   Fecal Occult Bld POSITIVE    PROTIME-INR      Component Value Range   Prothrombin Time 15.2  11.6 - 15.2 (seconds)   INR 1.18  0.00 - 1.49   APTT      Component Value Range   aPTT 22 (*) 24 - 37 (seconds)  CROSSMATCH      Component Value Range   ABO/RH(D) A POS     Antibody Screen POS     Sample Expiration 11/15/2010     Antibody Identification ANTI-M     PT AG Type NEGATIVE FOR M ANTIGEN     DAT, IgG NEG     Unit Number [id number]     Blood Component Type RED CELLS,LR     Unit division 00     Status of Unit ISSUED,FINAL     Transfusion Status OK TO TRANSFUSE     Crossmatch Result COMPATIBLE     Unit Number [id number]     Blood Component Type RED CELLS,LR     Unit division 00     Status of Unit ISSUED,FINAL     Transfusion Status OK TO TRANSFUSE     Crossmatch Result COMPATIBLE     Unit  Number [id number]     Blood Component Type RED CELLS,LR     Unit division 00     Status of Unit ISSUED,FINAL     Transfusion Status OK TO TRANSFUSE  Crossmatch Result COMPATIBLE     Unit Number [id number]     Blood Component Type RED CELLS,LR     Unit division 00     Status of Unit ISSUED,FINAL     Transfusion Status OK TO TRANSFUSE     Crossmatch Result COMPATIBLE     Unit Number [id number]     Blood Component Type RED CELLS,LR     Unit division 00     Status of Unit ISSUED,FINAL     Transfusion Status OK TO TRANSFUSE     Crossmatch Result COMPATIBLE     Unit Number [id number]     Blood Component Type RED CELLS,LR     Unit division 00     Status of Unit ISSUED,FINAL     Transfusion Status OK TO TRANSFUSE     Crossmatch Result COMPATIBLE    GLUCOSE, CAPILLARY      Component Value Range   Glucose-Capillary 383 (*) 70 - 99 (mg/dL)  GLUCOSE, CAPILLARY      Component Value Range   Glucose-Capillary 343 (*) 70 - 99 (mg/dL)  GLUCOSE, CAPILLARY      Component Value Range   Glucose-Capillary 343 (*) 70 - 99 (mg/dL)  CK TOTAL AND CKMB      Component Value Range   Total CK 42  7 - 232 (U/L)   CK, MB 2.6  0.3 - 4.0 (ng/mL)   Relative Index    0.0 - 2.5    Value: RELATIVE INDEX IS INVALID     WHEN CK < 100 U/L             TROPONIN I      Component Value Range   Troponin I    0.00 - 0.06 (ng/mL)   Value: 0.03            NO INDICATION OF     MYOCARDIAL INJURY.  MRSA PCR SCREENING      Component Value Range   MRSA by PCR    NEGATIVE    Value: NEGATIVE            The GeneXpert MRSA Assay (FDA     approved for NASAL specimens     only), is one component of a     comprehensive MRSA colonization     surveillance program. It is not     intended to diagnose MRSA     infection nor to guide or     monitor treatment for     MRSA infections.  CARDIAC PANEL(CRET KIN+CKTOT+MB+TROPI)      Component Value Range   Total CK 34  7 - 232 (U/L)   CK, MB 2.4  0.3 - 4.0 (ng/mL)   Troponin I     0.00 - 0.06 (ng/mL)   Value: 0.03            NO INDICATION OF     MYOCARDIAL INJURY.   Relative Index    0.0 - 2.5    Value: RELATIVE INDEX IS INVALID     WHEN CK < 100 U/L             HEMOGLOBIN A1C      Component Value Range   Hemoglobin A1C   (*) <5.7 (%)   Value: 10.0     (NOTE)  According to the ADA Clinical Practice Recommendations for 2011, when HbA1c is used as a screening test:   >=6.5%   Diagnostic of Diabetes Mellitus           (if abnormal result      is confirmed)  5.7-6.4%   Increased risk of developing Diabetes Mellitus  References:Diagnosis and Classification of Diabetes Mellitus,Diabetes Care,2011,34(Suppl 1):S62-S69 and Standards of Medical Care in         Diabetes - 2011,Diabetes Care,2011,34      (Suppl 1):S11-S61.   Mean Plasma Glucose 240 (*) <117 (mg/dL)  HEMOGLOBIN AND HEMATOCRIT, BLOOD      Component Value Range   Hemoglobin 7.9 (*) 13.0 - 17.0 (g/dL)   HCT 13.0 (*) 86.5 - 52.0 (%)  CARDIAC PANEL(CRET KIN+CKTOT+MB+TROPI)      Component Value Range   Total CK 40  7 - 232 (U/L)   CK, MB 2.5  0.3 - 4.0 (ng/mL)   Troponin I    0.00 - 0.06 (ng/mL)   Value: 0.05            NO INDICATION OF     MYOCARDIAL INJURY.   Relative Index    0.0 - 2.5    Value: RELATIVE INDEX IS INVALID     WHEN CK < 100 U/L             PREPARE RBC (CROSSMATCH)      Component Value Range   Order Confirmation ORDER PROCESSED BY BLOOD BANK    GLUCOSE, CAPILLARY      Component Value Range   Glucose-Capillary 295 (*) 70 - 99 (mg/dL)   Comment 1 Notify RN     Comment 2 Documented in Chart    GLUCOSE, CAPILLARY      Component Value Range   Glucose-Capillary 227 (*) 70 - 99 (mg/dL)   Comment 1 Notify RN     Comment 2 Documented in Chart    GLUCOSE, CAPILLARY      Component Value Range   Glucose-Capillary 216 (*) 70 - 99 (mg/dL)   Comment 1 Notify RN     Comment 2 Documented in Chart    CBC      Component Value  Range   WBC 18.6 (*) 4.0 - 10.5 (K/uL)   RBC 3.05 (*) 4.22 - 5.81 (MIL/uL)   Hemoglobin 9.6 DELTA CHECK NOTED POST TRANSFUSION SPECIMEN (*) 13.0 - 17.0 (g/dL)   HCT 78.4 (*) 69.6 - 52.0 (%)   MCV 89.2  78.0 - 100.0 (fL)   MCH 31.5  26.0 - 34.0 (pg)   MCHC 35.3  30.0 - 36.0 (g/dL)   RDW 29.5  28.4 - 13.2 (%)   Platelets 190 DELTA CHECK NOTED  150 - 400 (K/uL)  RENAL FUNCTION PANEL      Component Value Range   Sodium 145  135 - 145 (mEq/L)   Potassium 3.6  3.5 - 5.1 (mEq/L)   Chloride 111  96 - 112 (mEq/L)   CO2 24  19 - 32 (mEq/L)   Glucose, Bld 230 (*) 70 - 99 (mg/dL)   BUN 61 (*) 6 - 23 (mg/dL)   Creatinine, Ser 4.40  0.4 - 1.5 (mg/dL)   Calcium 8.2 (*) 8.4 - 10.5 (mg/dL)   Phosphorus 3.0  2.3 - 4.6 (mg/dL)   Albumin 2.5 (*) 3.5 - 5.2 (g/dL)   GFR calc non Af Amer 49 (*) >60 (mL/min)   GFR calc Af Amer   (*) >60 (mL/min)   Value: 59  The eGFR has been calculated     using the MDRD equation.     This calculation has not been     validated in all clinical     situations.     eGFR's persistently     <60 mL/min signify     possible Chronic Kidney Disease.  CARDIAC PANEL(CRET KIN+CKTOT+MB+TROPI)      Component Value Range   Total CK 42  7 - 232 (U/L)   CK, MB 3.0  0.3 - 4.0 (ng/mL)   Troponin I    0.00 - 0.06 (ng/mL)   Value: 0.03            NO INDICATION OF     MYOCARDIAL INJURY.   Relative Index    0.0 - 2.5    Value: RELATIVE INDEX IS INVALID     WHEN CK < 100 U/L             MAGNESIUM      Component Value Range   Magnesium 1.6  1.5 - 2.5 (mg/dL)  MAGNESIUM      Component Value Range   Magnesium 1.8  1.5 - 2.5 (mg/dL)  PHOSPHORUS      Component Value Range   Phosphorus 3.2  2.3 - 4.6 (mg/dL)  CBC      Component Value Range   WBC 15.6 (*) 4.0 - 10.5 (K/uL)   RBC 2.89 (*) 4.22 - 5.81 (MIL/uL)   Hemoglobin 9.0 (*) 13.0 - 17.0 (g/dL)   HCT 40.9 (*) 81.1 - 52.0 (%)   MCV 89.3  78.0 - 100.0 (fL)   MCH 31.1  26.0 - 34.0 (pg)   MCHC 34.9  30.0 - 36.0  (g/dL)   RDW 91.4  78.2 - 95.6 (%)   Platelets 160  150 - 400 (K/uL)  GLUCOSE, CAPILLARY      Component Value Range   Glucose-Capillary 294 (*) 70 - 99 (mg/dL)   Comment 1 Notify RN     Comment 2 Documented in Chart    GLUCOSE, CAPILLARY      Component Value Range   Glucose-Capillary 317 (*) 70 - 99 (mg/dL)   Comment 1 Notify RN    GLUCOSE, CAPILLARY      Component Value Range   Glucose-Capillary 285 (*) 70 - 99 (mg/dL)   Comment 1 Notify RN    GLUCOSE, CAPILLARY      Component Value Range   Glucose-Capillary 287 (*) 70 - 99 (mg/dL)   Comment 1 Notify RN    GLUCOSE, CAPILLARY      Component Value Range   Glucose-Capillary 233 (*) 70 - 99 (mg/dL)   Comment 1 Notify RN    GLUCOSE, CAPILLARY      Component Value Range   Glucose-Capillary 349 (*) 70 - 99 (mg/dL)   Comment 1 Notify RN    GLUCOSE, CAPILLARY      Component Value Range   Glucose-Capillary 301 (*) 70 - 99 (mg/dL)   Comment 1 Notify RN    GLUCOSE, CAPILLARY      Component Value Range   Glucose-Capillary 190 (*) 70 - 99 (mg/dL)  CBC      Component Value Range   WBC 13.6 (*) 4.0 - 10.5 (K/uL)   RBC 2.54 (*) 4.22 - 5.81 (MIL/uL)   Hemoglobin 8.1 (*) 13.0 - 17.0 (g/dL)   HCT 21.3 (*) 08.6 - 52.0 (%)   MCV 91.3  78.0 - 100.0 (fL)   MCH 31.9  26.0 - 34.0 (pg)  MCHC 34.9  30.0 - 36.0 (g/dL)   RDW 16.1  09.6 - 04.5 (%)   Platelets 155  150 - 400 (K/uL)  GLUCOSE, CAPILLARY      Component Value Range   Glucose-Capillary 187 (*) 70 - 99 (mg/dL)  CARDIAC PANEL(CRET KIN+CKTOT+MB+TROPI)      Component Value Range   Total CK 51  7 - 232 (U/L)   CK, MB 3.2  0.3 - 4.0 (ng/mL)   Troponin I    0.00 - 0.06 (ng/mL)   Value: 0.03            NO INDICATION OF     MYOCARDIAL INJURY.   Relative Index    0.0 - 2.5    Value: RELATIVE INDEX IS INVALID     WHEN CK < 100 U/L             CBC      Component Value Range   WBC 11.5 (*) 4.0 - 10.5 (K/uL)   RBC 2.35 (*) 4.22 - 5.81 (MIL/uL)   Hemoglobin 7.5 (*) 13.0 - 17.0 (g/dL)     HCT 40.9 (*) 81.1 - 52.0 (%)   MCV 91.9  78.0 - 100.0 (fL)   MCH 31.9  26.0 - 34.0 (pg)   MCHC 34.7  30.0 - 36.0 (g/dL)   RDW 91.4 (*) 78.2 - 15.5 (%)   Platelets 153  150 - 400 (K/uL)  COMPREHENSIVE METABOLIC PANEL      Component Value Range   Sodium 142  135 - 145 (mEq/L)   Potassium 3.0 (*) 3.5 - 5.1 (mEq/L)   Chloride 112  96 - 112 (mEq/L)   CO2 24  19 - 32 (mEq/L)   Glucose, Bld 100 (*) 70 - 99 (mg/dL)   BUN 42 (*) 6 - 23 (mg/dL)   Creatinine, Ser 9.56  0.4 - 1.5 (mg/dL)   Calcium 8.1 (*) 8.4 - 10.5 (mg/dL)   Total Protein 3.9 (*) 6.0 - 8.3 (g/dL)   Albumin 2.2 (*) 3.5 - 5.2 (g/dL)   AST 18  0 - 37 (U/L)   ALT 14  0 - 53 (U/L)   Alkaline Phosphatase 22 (*) 39 - 117 (U/L)   Total Bilirubin 0.5  0.3 - 1.2 (mg/dL)   GFR calc non Af Amer 55 (*) >60 (mL/min)   GFR calc Af Amer    >60 (mL/min)   Value: >60            The eGFR has been calculated     using the MDRD equation.     This calculation has not been     validated in all clinical     situations.     eGFR's persistently     <60 mL/min signify     possible Chronic Kidney Disease.  GLUCOSE, CAPILLARY      Component Value Range   Glucose-Capillary 133 (*) 70 - 99 (mg/dL)   Comment 1 Documented in Chart     Comment 2 Notify RN    GLUCOSE, CAPILLARY      Component Value Range   Glucose-Capillary 122 (*) 70 - 99 (mg/dL)   Comment 1 Notify RN     Comment 2 Documented in Chart    C-PEPTIDE      Component Value Range   C-Peptide 1.69  0.80 - 3.90 (ng/mL)  GLUCOSE, CAPILLARY      Component Value Range   Glucose-Capillary 236 (*) 70 - 99 (mg/dL)   Comment 1 Notify RN  Comment 2 Documented in Chart    GLUCOSE, CAPILLARY      Component Value Range   Glucose-Capillary 190 (*) 70 - 99 (mg/dL)   Comment 1 Notify RN     Comment 2 Documented in Chart    CBC      Component Value Range   WBC 11.1 (*) 4.0 - 10.5 (K/uL)   RBC 3.14 (*) 4.22 - 5.81 (MIL/uL)   Hemoglobin 9.9 POST TRANSFUSION SPECIMEN (*) 13.0 - 17.0  (g/dL)   HCT 09.8 (*) 11.9 - 52.0 (%)   MCV 91.4  78.0 - 100.0 (fL)   MCH 31.5  26.0 - 34.0 (pg)   MCHC 34.5  30.0 - 36.0 (g/dL)   RDW 14.7 (*) 82.9 - 15.5 (%)   Platelets 148 (*) 150 - 400 (K/uL)  CBC      Component Value Range   WBC 9.9 WHITE COUNT CONFIRMED ON SMEAR  4.0 - 10.5 (K/uL)   RBC 2.91 (*) 4.22 - 5.81 (MIL/uL)   Hemoglobin 9.1 (*) 13.0 - 17.0 (g/dL)   HCT 56.2 (*) 13.0 - 52.0 (%)   MCV 89.7  78.0 - 100.0 (fL)   MCH 31.3  26.0 - 34.0 (pg)   MCHC 34.9  30.0 - 36.0 (g/dL)   RDW 86.5 (*) 78.4 - 15.5 (%)   Platelets 176  150 - 400 (K/uL)  GLUCOSE, CAPILLARY      Component Value Range   Glucose-Capillary 199 (*) 70 - 99 (mg/dL)   Comment 1 Notify RN     Comment 2 Documented in Chart     Comment 3 Orig Pt Id entered as [id number]    BASIC METABOLIC PANEL      Component Value Range   Sodium 141  135 - 145 (mEq/L)   Potassium 3.9 DELTA CHECK NOTED  3.5 - 5.1 (mEq/L)   Chloride 111  96 - 112 (mEq/L)   CO2 24  19 - 32 (mEq/L)   Glucose, Bld 165 (*) 70 - 99 (mg/dL)   BUN 21 DELTA CHECK NOTED  6 - 23 (mg/dL)   Creatinine, Ser 1.32  0.4 - 1.5 (mg/dL)   Calcium 8.3 (*) 8.4 - 10.5 (mg/dL)   GFR calc non Af Amer >60  >60 (mL/min)   GFR calc Af Amer    >60 (mL/min)   Value: >60            The eGFR has been calculated     using the MDRD equation.     This calculation has not been     validated in all clinical     situations.     eGFR's persistently     <60 mL/min signify     possible Chronic Kidney Disease.  GLUCOSE, CAPILLARY      Component Value Range   Glucose-Capillary 100 (*) 70 - 99 (mg/dL)   Comment 1 Notify RN     Comment 2 Documented in Chart    GLUCOSE, CAPILLARY      Component Value Range   Glucose-Capillary 163 (*) 70 - 99 (mg/dL)   Comment 1 Notify RN     Comment 2 Documented in Chart    GLUCOSE, CAPILLARY      Component Value Range   Glucose-Capillary 186 (*) 70 - 99 (mg/dL)   Comment 1 Notify RN     Comment 2 Documented in Chart    CBC       Component Value Range   WBC 8.4  4.0 - 10.5 (K/uL)  RBC 3.00 (*) 4.22 - 5.81 (MIL/uL)   Hemoglobin 9.3 (*) 13.0 - 17.0 (g/dL)   HCT 16.1 (*) 09.6 - 52.0 (%)   MCV 92.7  78.0 - 100.0 (fL)   MCH 31.0  26.0 - 34.0 (pg)   MCHC 33.5  30.0 - 36.0 (g/dL)   RDW 04.5 (*) 40.9 - 15.5 (%)   Platelets 172  150 - 400 (K/uL)  GLUCOSE, CAPILLARY      Component Value Range   Glucose-Capillary 190 (*) 70 - 99 (mg/dL)   Comment 1 Notify RN     Comment 2 Documented in Chart    GLUCOSE, CAPILLARY      Component Value Range   Glucose-Capillary 181 (*) 70 - 99 (mg/dL)  GLUCOSE, CAPILLARY      Component Value Range   Glucose-Capillary 198 (*) 70 - 99 (mg/dL)  GLUCOSE, CAPILLARY      Component Value Range   Glucose-Capillary 75  70 - 99 (mg/dL)  CBC      Component Value Range   WBC 10.1  4.0 - 10.5 (K/uL)   RBC 3.22 (*) 4.22 - 5.81 (MIL/uL)   Hemoglobin 10.0 (*) 13.0 - 17.0 (g/dL)   HCT 81.1 (*) 91.4 - 52.0 (%)   MCV 91.9  78.0 - 100.0 (fL)   MCH 31.1  26.0 - 34.0 (pg)   MCHC 33.8  30.0 - 36.0 (g/dL)   RDW 78.2 (*) 95.6 - 15.5 (%)   Platelets 213  150 - 400 (K/uL)  GLUCOSE, CAPILLARY      Component Value Range   Glucose-Capillary 102 (*) 70 - 99 (mg/dL)   Comment 1 Notify RN    GLUCOSE, CAPILLARY      Component Value Range   Glucose-Capillary 166 (*) 70 - 99 (mg/dL)   Comment 1 Notify RN    GLUCOSE, CAPILLARY      Component Value Range   Glucose-Capillary 232 (*) 70 - 99 (mg/dL)  GLUCOSE, CAPILLARY      Component Value Range   Glucose-Capillary 216 (*) 70 - 99 (mg/dL)  GLUCOSE, CAPILLARY      Component Value Range   Glucose-Capillary 210 (*) 70 - 99 (mg/dL)   Comment 1 Notify RN    CBC      Component Value Range   WBC 12.6 (*) 4.0 - 10.5 (K/uL)   RBC 3.06 (*) 4.22 - 5.81 (MIL/uL)   Hemoglobin 9.5 (*) 13.0 - 17.0 (g/dL)   HCT 21.3 (*) 08.6 - 52.0 (%)   MCV 93.1  78.0 - 100.0 (fL)   MCH 31.0  26.0 - 34.0 (pg)   MCHC 33.3  30.0 - 36.0 (g/dL)   RDW 57.8 (*) 46.9 - 15.5 (%)    Platelets 247  150 - 400 (K/uL)  BASIC METABOLIC PANEL      Component Value Range   Sodium 137  135 - 145 (mEq/L)   Potassium 4.4  3.5 - 5.1 (mEq/L)   Chloride 106  96 - 112 (mEq/L)   CO2 23  19 - 32 (mEq/L)   Glucose, Bld 142 (*) 70 - 99 (mg/dL)   BUN 14  6 - 23 (mg/dL)   Creatinine, Ser 6.29  0.4 - 1.5 (mg/dL)   Calcium 8.9  8.4 - 52.8 (mg/dL)   GFR calc non Af Amer >60  >60 (mL/min)   GFR calc Af Amer    >60 (mL/min)   Value: >60            The eGFR has  been calculated     using the MDRD equation.     This calculation has not been     validated in all clinical     situations.     eGFR's persistently     <60 mL/min signify     possible Chronic Kidney Disease.  GLUCOSE, CAPILLARY      Component Value Range   Glucose-Capillary 138 (*) 70 - 99 (mg/dL)   Comment 1 Notify RN     Comment 2 Documented in Chart    GLUCOSE, CAPILLARY      Component Value Range   Glucose-Capillary 253 (*) 70 - 99 (mg/dL)  GLUCOSE, CAPILLARY      Component Value Range   Glucose-Capillary 198 (*) 70 - 99 (mg/dL)  GLUCOSE, CAPILLARY      Component Value Range   Glucose-Capillary 195 (*) 70 - 99 (mg/dL)  CBC      Component Value Range   WBC 12.2 (*) 4.0 - 10.5 (K/uL)   RBC 3.08 (*) 4.22 - 5.81 (MIL/uL)   Hemoglobin 9.4 (*) 13.0 - 17.0 (g/dL)   HCT 16.1 (*) 09.6 - 52.0 (%)   MCV 93.2  78.0 - 100.0 (fL)   MCH 30.5  26.0 - 34.0 (pg)   MCHC 32.8  30.0 - 36.0 (g/dL)   RDW 04.5 (*) 40.9 - 15.5 (%)   Platelets 315  150 - 400 (K/uL)  GLUCOSE, CAPILLARY      Component Value Range   Glucose-Capillary 119 (*) 70 - 99 (mg/dL)   Comment 1 Documented in Chart     Comment 2 Notify RN    GLUCOSE, CAPILLARY      Component Value Range   Glucose-Capillary 168 (*) 70 - 99 (mg/dL)   Ct Head Wo Contrast  12/17/2011  *RADIOLOGY REPORT*  Clinical Data: Larey Seat face first on to a floor, hitting eye.  Pain and headache.  CT HEAD WITHOUT CONTRAST  Technique:  Contiguous axial images were obtained from the base of the  skull through the vertex without contrast.  Comparison: MRI brain 11/08/2010.  CT head 11/07/2010.  Findings: There is no evidence for acute infarction, intracranial hemorrhage, mass lesion, hydrocephalus, or extra-axial fluid. Moderate to severe atrophy is present.  Advanced chronic microvascular ischemic change is present in the periventricular and subcortical white matter.  The calvarium is intact.  There is left preseptal periorbital soft tissue swelling. There is a large left supraorbital scalp hematoma without laceration or foreign body.  The left frontal sinus is hypoplastic.  The right frontal sinus is clear.  There is no right or left ethmoid fluid.  Within limits of visualization, no right or left maxillary sinus fluid.  Both globes appear symmetric with suspected bilateral cataract extraction.  Little change from priors other than the scalp hematoma.  The left zygomaticomaxillary suture is unusually well seen as compared to the right (image 5 of series 3); this appearance is similar to prior CT of 11/27/2010 (image 2 of series 3). The facial bones are not imaged in their entirety.  If there is concern for facial fracture or tenderness in this region, of CT maxillofacial could be helpful in further evaluation.  IMPRESSION: Atrophy and small vessel disease.  No acute intracranial abnormality.  Left supraorbital scalp hematoma and preseptal periorbital soft tissue swelling.  Unusually prominent left zygomaticomaxillary suture, although a similar appearance was seen from priors.  If there is concern for facial bone fracture, recommend CT maxillofacial.  Original Report Authenticated By: Elsie Stain,  M.D.       1. Abrasion of forehead   2. Head injury       MDM  Pt with no evidence of ICH.  No bony tenderness to suggest facial fracture.  Abrasion does not need sutures.  TDAP UTD        Rolan Bucco, MD 12/17/11 1319

## 2012-02-28 ENCOUNTER — Other Ambulatory Visit: Payer: Self-pay | Admitting: Dermatology

## 2012-04-04 IMAGING — CT CT HEAD W/O CM
1 of 2 series · 13 of 30 positions shown, 17 images · non-contrast
Comparison: None.

CLINICAL DATA: Left-sided hemiparesis.  Code stroke.  Diabetes and
hypertension.

CT HEAD WITHOUT CONTRAST
TECHNIQUE: Contiguous axial images were obtained from the base of
the skull through the vertex without contrast.

[Series 2: brain · axial · 0.47mm/px · z∈[+189,+322]mm · 13 of 32 slices shown, 17 images]
[im 3/32  brain]
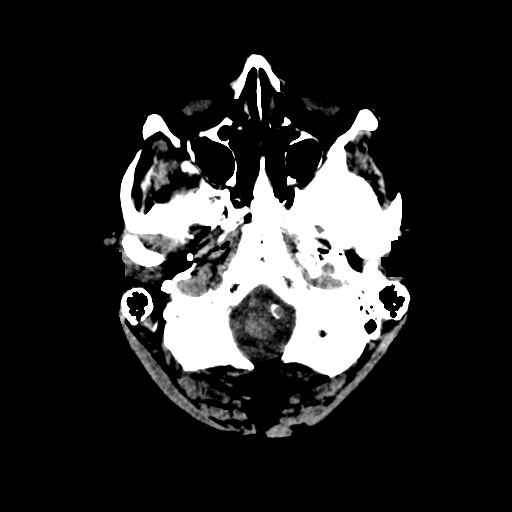
[im 3/32  bone]
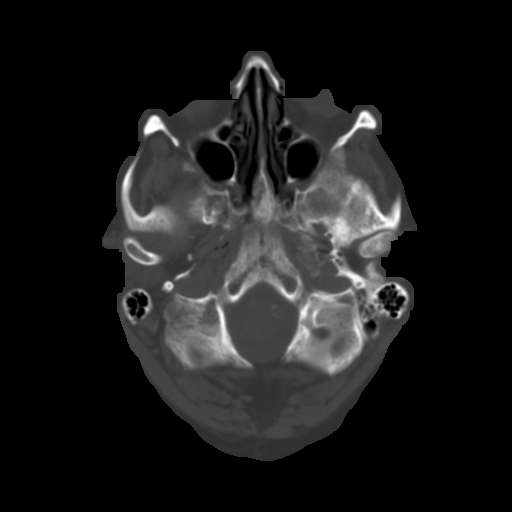
[im 5/32  brain]
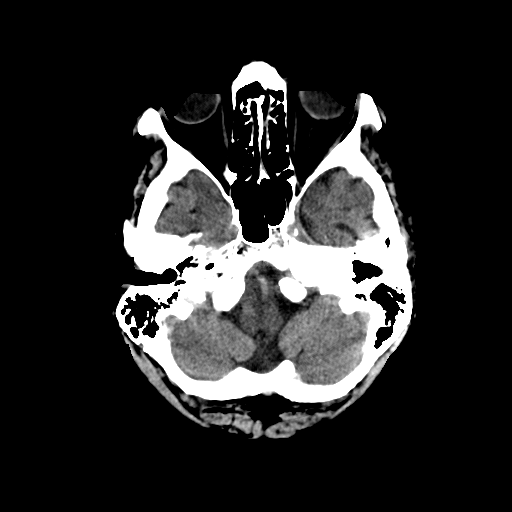
[im 7/32  brain]
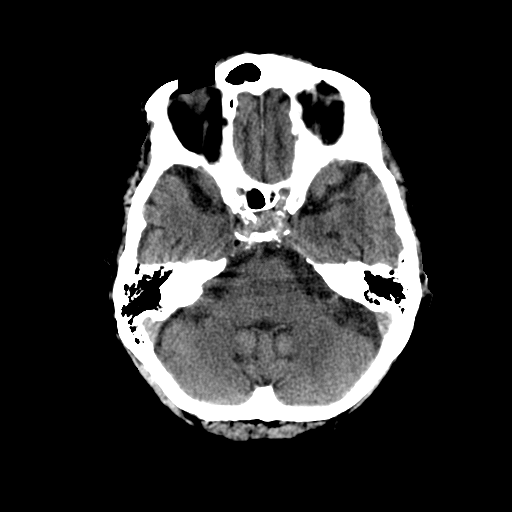
[im 9/32  brain]
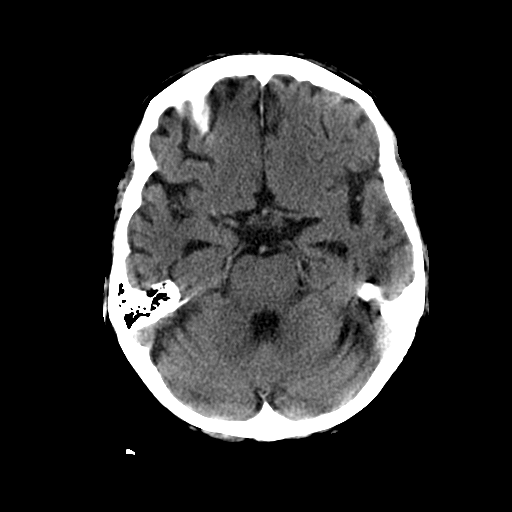
[im 12/32  brain]
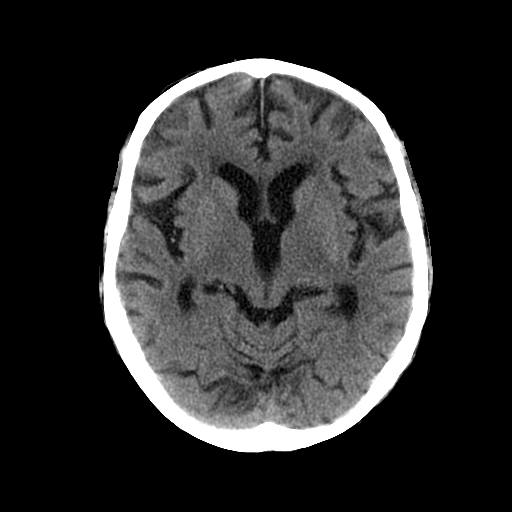
[im 12/32  bone]
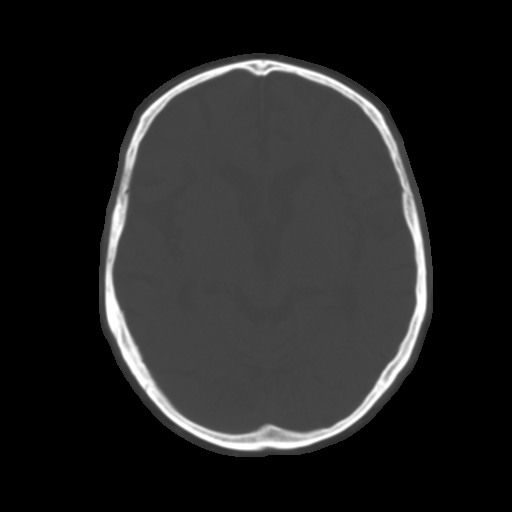
[im 14/32  brain]
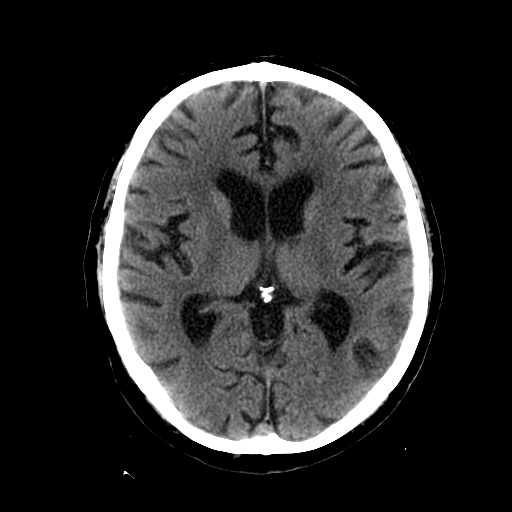
[im 16/32  brain]
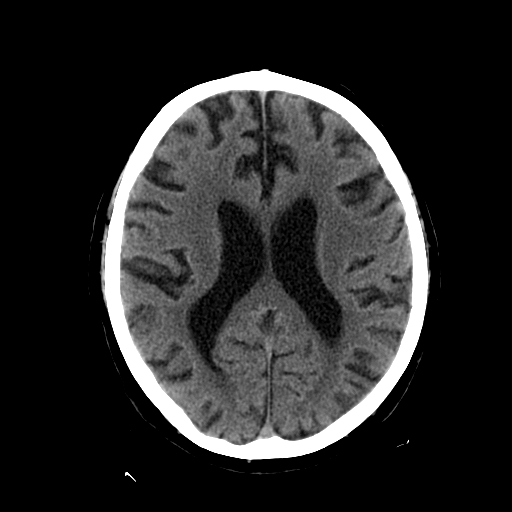
[im 18/32  brain]
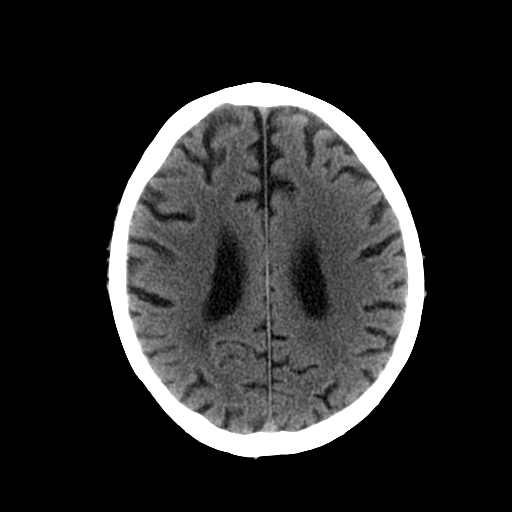
[im 20/32  brain]
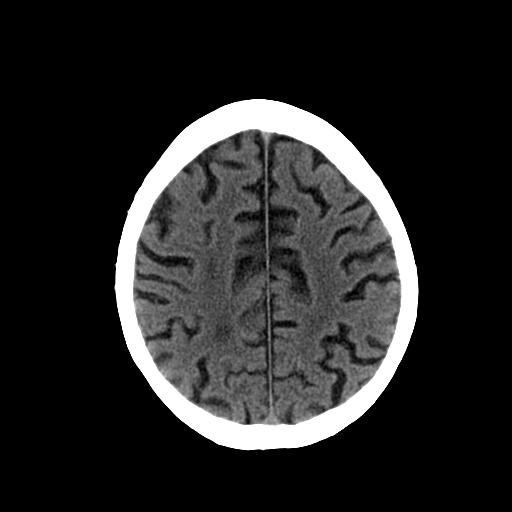
[im 20/32  bone]
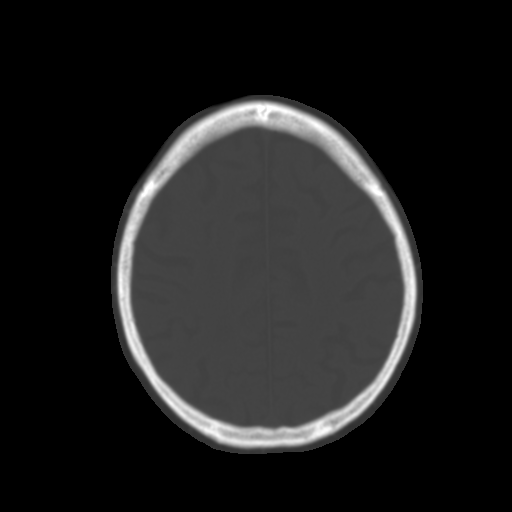
[im 23/32  brain]
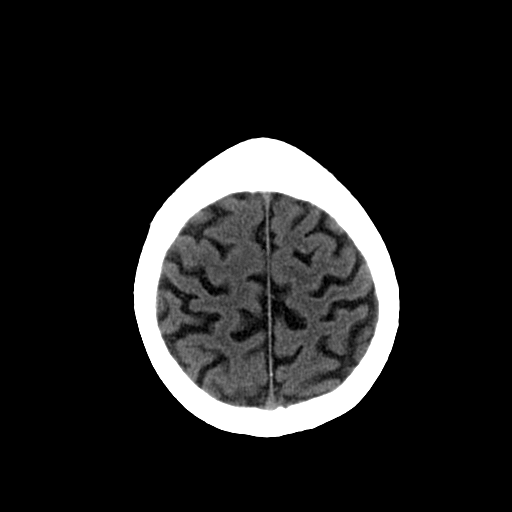
[im 25/32  brain]
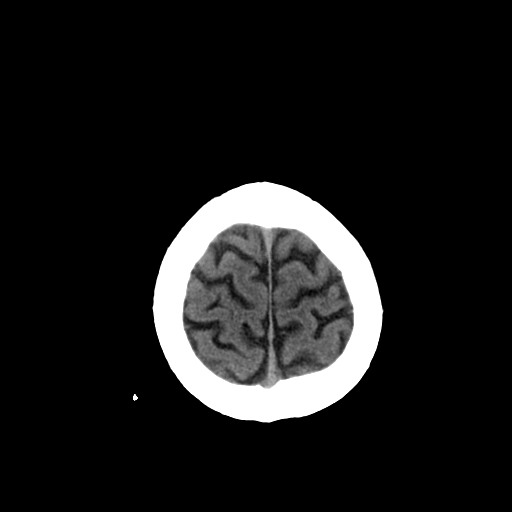
[im 27/32  brain]
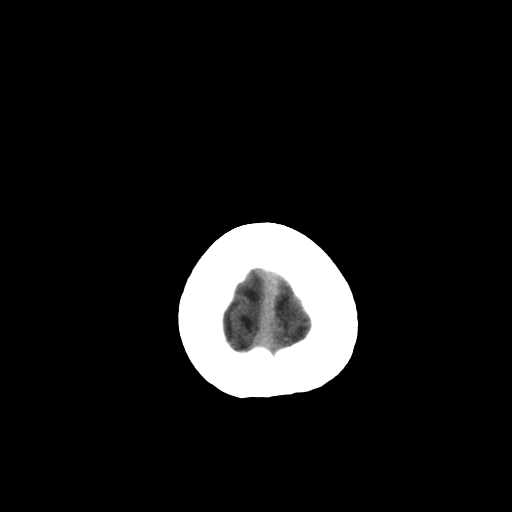
[im 29/32  brain]
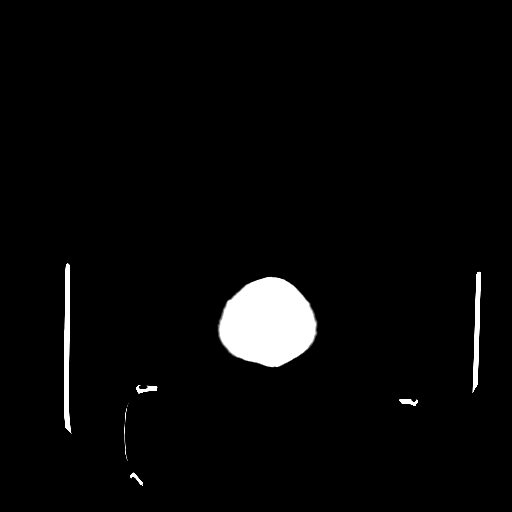
[im 29/32  bone]
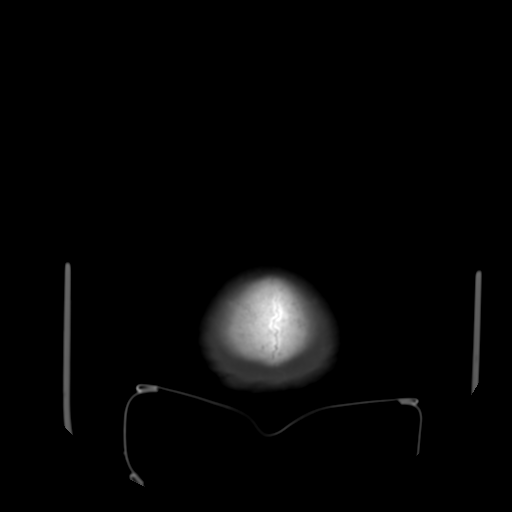

[13 of 30 positions shown; findings below may reference images not displayed]

FINDINGS: There is no evidence of intracranial hemorrhage, brain
edema or other signs of acute infarction.  There is no evidence of
intracranial mass lesion or mass effect.  No abnormal extra-axial
fluid collections are identified.

Mild diffuse cerebral atrophy and chronic microvascular matter
disease is noted.  No evidence of hydrocephalus.  No skull
abnormality identified.
IMPRESSION: 1.  No acute intracranial abnormality.
2.  Mild cerebral atrophy and chronic small vessel disease.

Code stroke head CT report was called to the ED by Dr. Betun at
8800 hours on 11/07/10.

## 2012-09-02 ENCOUNTER — Other Ambulatory Visit: Payer: Self-pay | Admitting: Dermatology

## 2013-03-03 ENCOUNTER — Other Ambulatory Visit: Payer: Self-pay | Admitting: Dermatology

## 2013-04-21 ENCOUNTER — Encounter (HOSPITAL_COMMUNITY): Payer: Self-pay | Admitting: Pharmacy Technician

## 2013-04-21 NOTE — Patient Instructions (Signed)
[facility]  04/21/2013   Your procedure is scheduled on:  04/28/13               Surgery [id number]   Report to [facility] at    0515 AM.  Call this number if you have problems the morning of surgery: [phone]   Remember:   Do not eat food or drink liquids after midnight.   Take these medicines the morning of surgery with A SIP OF WATER:    Do not wear jewelry,   Do not wear lotions, powders, or perfumes.   . Men may shave face and neck.  Do not bring valuables to the hospital.  Contacts, dentures or bridgework may not be worn into surgery.  Leave suitcase in the car. After surgery it may be brought to your room.  For patients admitted to the hospital, checkout time is 11:00 AM the day of  discharge.    SEE CHG INSTRUCTION SHEET    Please read over the following fact sheets that you were given: MRSA Information, coughing and deep breathing exercises, leg exercises, Blood Transfusion Fact Sheet, Incentive Spirometry Fact sheet                Failure to comply with these instructions may result in cancellation of your surgery.                Patient Signature ____________________________              Nurse Signature _____________________________

## 2013-04-22 ENCOUNTER — Ambulatory Visit (HOSPITAL_COMMUNITY)
Admission: RE | Admit: 2013-04-22 | Discharge: 2013-04-22 | Disposition: A | Discharge status: Home / Self Care | Payer: Medicare Other | Source: Ambulatory Visit | Attending: Orthopedic Surgery | Admitting: Orthopedic Surgery

## 2013-04-22 ENCOUNTER — Encounter (HOSPITAL_COMMUNITY): Payer: Self-pay

## 2013-04-22 ENCOUNTER — Encounter (HOSPITAL_COMMUNITY)
Admission: RE | Admit: 2013-04-22 | Discharge: 2013-04-22 | Disposition: A | Discharge status: Home / Self Care | Payer: Medicare Other | Source: Ambulatory Visit | Attending: Orthopedic Surgery | Admitting: Orthopedic Surgery

## 2013-04-22 DIAGNOSIS — M1610 Unilateral primary osteoarthritis, unspecified hip: Secondary | ICD-10-CM | POA: Insufficient documentation

## 2013-04-22 DIAGNOSIS — Z01812 Encounter for preprocedural laboratory examination: Secondary | ICD-10-CM | POA: Insufficient documentation

## 2013-04-22 DIAGNOSIS — Z01810 Encounter for preprocedural cardiovascular examination: Secondary | ICD-10-CM | POA: Insufficient documentation

## 2013-04-22 DIAGNOSIS — M169 Osteoarthritis of hip, unspecified: Secondary | ICD-10-CM | POA: Insufficient documentation

## 2013-04-22 DIAGNOSIS — I10 Essential (primary) hypertension: Secondary | ICD-10-CM | POA: Insufficient documentation

## 2013-04-22 DIAGNOSIS — Z01818 Encounter for other preprocedural examination: Secondary | ICD-10-CM | POA: Insufficient documentation

## 2013-04-22 HISTORY — DX: Unspecified osteoarthritis, unspecified site: M19.90

## 2013-04-22 HISTORY — DX: Cerebral infarction, unspecified: I63.9

## 2013-04-22 HISTORY — DX: Type 2 diabetes mellitus without complications: E11.9

## 2013-04-22 LAB — URINALYSIS, ROUTINE W REFLEX MICROSCOPIC
Bilirubin Urine: NEGATIVE
Glucose, UA: NEGATIVE mg/dL
Hgb urine dipstick: NEGATIVE
Ketones, ur: NEGATIVE mg/dL
Leukocytes, UA: NEGATIVE
Nitrite: NEGATIVE
Protein, ur: NEGATIVE mg/dL
Specific Gravity, Urine: 1.02 (ref 1.005–1.030)
Urobilinogen, UA: 1 mg/dL (ref 0.0–1.0)
pH: 5 (ref 5.0–8.0)

## 2013-04-22 LAB — PROTIME-INR
INR: 1.05 (ref 0.00–1.49)
Prothrombin Time: 13.5 seconds (ref 11.6–15.2)

## 2013-04-22 LAB — CBC
HCT: 40 % (ref 39.0–52.0)
Hemoglobin: 13.3 g/dL (ref 13.0–17.0)
MCH: 32 pg (ref 26.0–34.0)
MCHC: 33.3 g/dL (ref 30.0–36.0)
MCV: 96.4 fL (ref 78.0–100.0)
Platelets: 382 10*3/uL (ref 150–400)
RBC: 4.15 MIL/uL — ABNORMAL LOW (ref 4.22–5.81)
RDW: 14 % (ref 11.5–15.5)
WBC: 8 10*3/uL (ref 4.0–10.5)

## 2013-04-22 LAB — APTT: aPTT: 34 seconds (ref 24–37)

## 2013-04-22 LAB — ABO/RH: ABO/RH(D): A POS

## 2013-04-22 LAB — BASIC METABOLIC PANEL
BUN: 37 mg/dL — ABNORMAL HIGH (ref 6–23)
CO2: 23 mEq/L (ref 19–32)
Calcium: 10.3 mg/dL (ref 8.4–10.5)
Chloride: 107 mEq/L (ref 96–112)
Creatinine, Ser: 1.68 mg/dL — ABNORMAL HIGH (ref 0.50–1.35)
GFR calc Af Amer: 41 mL/min — ABNORMAL LOW (ref >90)
GFR calc non Af Amer: 35 mL/min — ABNORMAL LOW (ref >90)
Glucose, Bld: 182 mg/dL — ABNORMAL HIGH (ref 70–99)
Potassium: 4.4 mEq/L (ref 3.5–5.1)
Sodium: 142 mEq/L (ref 135–145)

## 2013-04-22 LAB — SURGICAL PCR SCREEN
MRSA, PCR: NEGATIVE
Staphylococcus aureus: NEGATIVE

## 2013-04-22 NOTE — Progress Notes (Signed)
Patient called at home to verify medications has takes at home.  Noted in PCP note on chart that patient was on Enalapril and called [facility] to verify patient was on .  Pharmacy verified they had just filled on 04/16/2013.  Patient called out names of all medications and verified he was taking enalapril. Added to home medications

## 2013-04-22 NOTE — Progress Notes (Signed)
BMP results faxed via EPIC to Dr Olin.   

## 2013-04-22 NOTE — Progress Notes (Signed)
Last office visit note with PCP 04/20/13 on chart Last EKG from office 2012 on chart  Last CXR from offic3 2011 on chart

## 2013-04-23 NOTE — Progress Notes (Signed)
Was the fall witnessed:Yes Patient condition before and after the fall: MOBILE, ALERT AND ORIENTATED  Patient's reaction to the fall: Embarrssed.  Daughter Ayesha Rumpf came into room with Dad.  Name of the doctor that was notified including date and time: None  Any interventions and vital signs: Skin tear on left elbow.  Cleaned and covered with gauze

## 2013-04-23 NOTE — H&P (Signed)
TOTAL HIP ADMISSION H&P  Patient is admitted for right total hip arthroplasty, anterior approach.  Subjective:  Chief Complaint: Right hip OA / pain  HPI: [name], 79 y.o. male, has a history of pain and functional disability in the right hip(s) due to arthritis and patient has failed non-surgical conservative treatments for greater than 12 weeks to include NSAID's and/or analgesics, corticosteriod injections, use of assistive devices and activity modification.  Onset of symptoms was gradual starting 1 years ago with rapidlly worsening course since that time.The patient noted no past surgery on the right hip(s).  Patient currently rates pain in the right hip at 10 out of 10 with activity. Patient has night pain, worsening of pain with activity and weight bearing, trendelenberg gait, pain that interfers with activities of daily living and pain with passive range of motion. Patient has evidence of periarticular osteophytes and joint space narrowing by imaging studies. This condition presents safety issues increasing the risk of falls.  There is no current active signs of infection.  Risks, benefits and expectations were discussed with the patient. Patient understand the risks, benefits and expectations and wishes to proceed with surgery.  D/C Plans:   Home with HHPT  Post-op Meds:    Rx given for ASA, Zanaflex, Iron, Colace and MiraLax  Tranexamic Acid:   Not to be given - Previous stroke  Decadron:    Not to be given - DM  FYI:    ASA post-op   Past Medical History  Diagnosis Date  . Hyperlipemia   . CVA (cerebral infarction)   . Hypertension   . Diabetes mellitus without complication     hx of no Insulin in 3 years 16109   . Stroke     2011 no deficits   . GERD (gastroesophageal reflux disease)     hx of   . Arthritis   . Ulcer     stomach 2011 related to plavix   . Hx of transfusion of packed red blood cells     No past surgical history on file.   No Known Allergies    History  Substance Use Topics  . Smoking status: Never Smoker   . Smokeless tobacco: Never Used  . Alcohol Use: 1.8 oz/week    3 Shots of liquor per week     Comment: wine or vodka       Review of Systems  Constitutional: Negative.   HENT: Negative.   Eyes: Negative.   Respiratory: Negative.   Cardiovascular: Negative.   Gastrointestinal: Positive for heartburn.  Genitourinary: Negative.   Musculoskeletal: Positive for joint pain.  Skin: Negative.   Neurological: Negative.   Endo/Heme/Allergies: Negative.   Psychiatric/Behavioral: Negative.     Objective:  Physical Exam  Constitutional: He is oriented to person, place, and time. He appears well-developed and well-nourished.  HENT:  Head: Normocephalic and atraumatic.  Mouth/Throat: Oropharynx is clear and moist.  Eyes: Pupils are equal, round, and reactive to light.  Neck: Normal range of motion. No JVD present. No tracheal deviation present. No thyromegaly present.  Cardiovascular: Normal rate, regular rhythm, normal heart sounds and intact distal pulses.   Respiratory: Effort normal and breath sounds normal. No stridor. No respiratory distress. He has no wheezes.  GI: Soft. There is no tenderness. There is no guarding.  Musculoskeletal:       Right hip: He exhibits decreased range of motion, decreased strength, tenderness and bony tenderness. He exhibits no crepitus, no deformity and no laceration.  Lymphadenopathy:    He has no cervical adenopathy.  Neurological: He is alert and oriented to person, place, and time.  Skin: Skin is warm and dry.  Psychiatric: He has a normal mood and affect.    Labs:  Estimated body mass index is 25.83 kg/(m^2) as calculated from the following:   Height as of 12/17/11: 5\' 9"  (1.753 m).   Weight as of 12/17/11: 79.379 kg (175 lb).   Imaging Review Plain radiographs demonstrate severe degenerative joint disease of the right hip(s). The bone quality appears to be good for age and  reported activity level.  Assessment/Plan:  End stage arthritis, right hip(s)  The patient history, physical examination, clinical judgement of the provider and imaging studies are consistent with end stage degenerative joint disease of the right hip(s) and total hip arthroplasty is deemed medically necessary. The treatment options including medical management, injection therapy, arthroscopy and arthroplasty were discussed at length. The risks and benefits of total hip arthroplasty were presented and reviewed. The risks due to aseptic loosening, infection, stiffness, dislocation/subluxation,  thromboembolic complications and other imponderables were discussed.  The patient acknowledged the explanation, agreed to proceed with the plan and consent was signed. Patient is being admitted for inpatient treatment for surgery, pain control, PT, OT, prophylactic antibiotics, VTE prophylaxis, progressive ambulation and ADL's and discharge planning.The patient is planning to be discharged home with home health services.    Anastasio Auerbach Tanzie Rothschild   PAC  04/23/2013, 4:28 PM

## 2013-04-27 NOTE — Anesthesia Preprocedure Evaluation (Addendum)
Anesthesia Evaluation  Patient identified by MRN, date of birth, ID band Patient awake    Reviewed: Allergy & Precautions, H&P , NPO status , Patient's Chart, lab work & pertinent test results  Airway Mallampati: II TM Distance: >3 FB     Dental  (+) Teeth Intact and Caps Caps upper incisors:   Pulmonary neg pulmonary ROS,  breath sounds clear to auscultation  Pulmonary exam normal       Cardiovascular hypertension, Rhythm:Irregular Rate:Abnormal     Neuro/Psych CVA, No Residual Symptoms negative psych ROS   GI/Hepatic Neg liver ROS, GERD-  Medicated,  Endo/Other  diabetes, Type 2  Renal/GU negative Renal ROS  negative genitourinary   Musculoskeletal  (+) Arthritis -, Osteoarthritis,    Abdominal   Peds  Hematology negative hematology ROS (+)   Anesthesia Other Findings   Reproductive/Obstetrics                          Anesthesia Physical Anesthesia Plan  ASA: III  Anesthesia Plan: General   Post-op Pain Management:    Induction: Intravenous  Airway Management Planned: Oral ETT  Additional Equipment:   Intra-op Plan:   Post-operative Plan: Extubation in OR  Informed Consent: I have reviewed the patients History and Physical, chart, labs and discussed the procedure including the risks, benefits and alternatives for the proposed anesthesia with the patient or authorized representative who has indicated his/her understanding and acceptance.   Dental advisory given  Plan Discussed with: CRNA  Anesthesia Plan Comments:         Anesthesia Quick Evaluation

## 2013-04-28 ENCOUNTER — Encounter (HOSPITAL_COMMUNITY)
Admission: RE | Disposition: A | Discharge status: Skilled Nursing Facility | Payer: Self-pay | Source: Ambulatory Visit | Attending: Orthopedic Surgery

## 2013-04-28 ENCOUNTER — Inpatient Hospital Stay (HOSPITAL_COMMUNITY)
Admission: RE | Admit: 2013-04-28 | Discharge: 2013-05-01 | DRG: 470 | Disposition: A | Discharge status: Skilled Nursing Facility | Payer: Medicare Other | Source: Ambulatory Visit | Attending: Orthopedic Surgery | Admitting: Orthopedic Surgery

## 2013-04-28 ENCOUNTER — Inpatient Hospital Stay (HOSPITAL_COMMUNITY): Payer: Medicare Other

## 2013-04-28 ENCOUNTER — Encounter (HOSPITAL_COMMUNITY): Payer: Self-pay

## 2013-04-28 ENCOUNTER — Encounter (HOSPITAL_COMMUNITY): Payer: Self-pay | Admitting: Anesthesiology

## 2013-04-28 ENCOUNTER — Inpatient Hospital Stay (HOSPITAL_COMMUNITY): Payer: Medicare Other | Admitting: Anesthesiology

## 2013-04-28 DIAGNOSIS — M1610 Unilateral primary osteoarthritis, unspecified hip: Principal | ICD-10-CM | POA: Diagnosis present

## 2013-04-28 DIAGNOSIS — I10 Essential (primary) hypertension: Secondary | ICD-10-CM | POA: Diagnosis present

## 2013-04-28 DIAGNOSIS — E663 Overweight: Secondary | ICD-10-CM

## 2013-04-28 DIAGNOSIS — D62 Acute posthemorrhagic anemia: Secondary | ICD-10-CM | POA: Diagnosis not present

## 2013-04-28 DIAGNOSIS — E785 Hyperlipidemia, unspecified: Secondary | ICD-10-CM | POA: Diagnosis present

## 2013-04-28 DIAGNOSIS — E119 Type 2 diabetes mellitus without complications: Secondary | ICD-10-CM | POA: Diagnosis present

## 2013-04-28 DIAGNOSIS — Z8673 Personal history of transient ischemic attack (TIA), and cerebral infarction without residual deficits: Secondary | ICD-10-CM

## 2013-04-28 DIAGNOSIS — M169 Osteoarthritis of hip, unspecified: Principal | ICD-10-CM | POA: Diagnosis present

## 2013-04-28 DIAGNOSIS — Z96649 Presence of unspecified artificial hip joint: Secondary | ICD-10-CM

## 2013-04-28 DIAGNOSIS — K219 Gastro-esophageal reflux disease without esophagitis: Secondary | ICD-10-CM | POA: Diagnosis present

## 2013-04-28 DIAGNOSIS — Z6827 Body mass index (BMI) 27.0-27.9, adult: Secondary | ICD-10-CM

## 2013-04-28 HISTORY — PX: TOTAL HIP ARTHROPLASTY: SHX124

## 2013-04-28 LAB — GLUCOSE, CAPILLARY
Glucose-Capillary: 133 mg/dL — ABNORMAL HIGH (ref 70–99)
Glucose-Capillary: 122 mg/dL — ABNORMAL HIGH (ref 70–99)
Glucose-Capillary: 156 mg/dL — ABNORMAL HIGH (ref 70–99)
Glucose-Capillary: 206 mg/dL — ABNORMAL HIGH (ref 70–99)
Glucose-Capillary: 219 mg/dL — ABNORMAL HIGH (ref 70–99)

## 2013-04-28 LAB — TYPE AND SCREEN
ABO/RH(D): A POS
Antibody Screen: NEGATIVE

## 2013-04-28 SURGERY — ARTHROPLASTY, HIP, TOTAL, ANTERIOR APPROACH
Anesthesia: General | Site: Hip | Laterality: Right | Wound class: Clean

## 2013-04-28 MED ORDER — LACTATED RINGERS IV SOLN: INTRAVENOUS | Status: DC

## 2013-04-28 MED ORDER — INSULIN ASPART 100 UNIT/ML ~~LOC~~ SOLN
0.0000 [IU] | Freq: Three times a day (TID) | SUBCUTANEOUS | Status: DC
  Administered 2013-04-28: 3 [IU] via SUBCUTANEOUS
  Administered 2013-04-29: 1 [IU] via SUBCUTANEOUS
  Administered 2013-04-29: 2 [IU] via SUBCUTANEOUS
  Administered 2013-04-29 – 2013-04-30 (×3): 1 [IU] via SUBCUTANEOUS
  Administered 2013-05-01: 2 [IU] via SUBCUTANEOUS

## 2013-04-28 MED ORDER — SENNA 8.6 MG PO TABS
1.0000 | ORAL_TABLET | Freq: Two times a day (BID) | ORAL | Status: DC
  Administered 2013-04-28 – 2013-04-29 (×3): 8.6 mg via ORAL
  Filled 2013-04-28 (×3): qty 1

## 2013-04-28 MED ORDER — POLYETHYLENE GLYCOL 3350 17 G PO PACK: 17.0000 g | PACK | Freq: Every day | ORAL | Status: DC | PRN

## 2013-04-28 MED ORDER — ALUMINUM HYDROXIDE GEL 320 MG/5ML PO SUSP
15.0000 mL | ORAL | Status: DC | PRN
  Filled 2013-04-28: qty 30

## 2013-04-28 MED ORDER — SODIUM CHLORIDE 0.9 % IV SOLN
INTRAVENOUS | Status: DC
  Administered 2013-04-28: 18:00:00 via INTRAVENOUS
  Filled 2013-04-28 (×9): qty 1000

## 2013-04-28 MED ORDER — CHLORHEXIDINE GLUCONATE 4 % EX LIQD
60.0000 mL | Freq: Once | CUTANEOUS | Status: DC
  Filled 2013-04-28: qty 60

## 2013-04-28 MED ORDER — FENTANYL CITRATE 0.05 MG/ML IJ SOLN: 25.0000 ug | INTRAMUSCULAR | Status: DC | PRN

## 2013-04-28 MED ORDER — METHOCARBAMOL 100 MG/ML IJ SOLN
500.0000 mg | Freq: Four times a day (QID) | INTRAVENOUS | Status: DC | PRN
  Administered 2013-04-28: 500 mg via INTRAVENOUS
  Filled 2013-04-28: qty 5

## 2013-04-28 MED ORDER — HYDROMORPHONE HCL PF 1 MG/ML IJ SOLN
INTRAMUSCULAR | Status: DC | PRN
  Administered 2013-04-28: 1 mg via INTRAVENOUS
  Administered 2013-04-28: .5 mg via INTRAVENOUS

## 2013-04-28 MED ORDER — ONDANSETRON HCL 4 MG/2ML IJ SOLN
INTRAMUSCULAR | Status: DC | PRN
  Administered 2013-04-28: 4 mg via INTRAVENOUS

## 2013-04-28 MED ORDER — SUCCINYLCHOLINE CHLORIDE 20 MG/ML IJ SOLN
INTRAMUSCULAR | Status: DC | PRN
  Administered 2013-04-28: 100 mg via INTRAVENOUS

## 2013-04-28 MED ORDER — NEOSTIGMINE METHYLSULFATE 1 MG/ML IJ SOLN
INTRAMUSCULAR | Status: DC | PRN
  Administered 2013-04-28: 4 mg via INTRAVENOUS

## 2013-04-28 MED ORDER — MENTHOL 3 MG MT LOZG: 1.0000 | LOZENGE | OROMUCOSAL | Status: DC | PRN

## 2013-04-28 MED ORDER — ONDANSETRON HCL 4 MG/2ML IJ SOLN: 4.0000 mg | Freq: Four times a day (QID) | INTRAMUSCULAR | Status: DC | PRN

## 2013-04-28 MED ORDER — PHENYLEPHRINE HCL 10 MG/ML IJ SOLN
INTRAMUSCULAR | Status: DC | PRN
  Administered 2013-04-28 (×5): 80 ug via INTRAVENOUS

## 2013-04-28 MED ORDER — ASPIRIN EC 325 MG PO TBEC
325.0000 mg | DELAYED_RELEASE_TABLET | Freq: Two times a day (BID) | ORAL | Status: AC
  Administered 2013-04-28 – 2013-05-01 (×6): 325 mg via ORAL
  Filled 2013-04-28 (×6): qty 1

## 2013-04-28 MED ORDER — PANTOPRAZOLE SODIUM 40 MG PO TBEC
40.0000 mg | DELAYED_RELEASE_TABLET | Freq: Every day | ORAL | Status: DC
  Administered 2013-04-29 – 2013-05-01 (×3): 40 mg via ORAL
  Filled 2013-04-28 (×3): qty 1

## 2013-04-28 MED ORDER — HYDROMORPHONE HCL PF 1 MG/ML IJ SOLN: 0.5000 mg | INTRAMUSCULAR | Status: DC | PRN

## 2013-04-28 MED ORDER — LACTATED RINGERS IV SOLN
INTRAVENOUS | Status: DC | PRN
  Administered 2013-04-28 (×2): via INTRAVENOUS

## 2013-04-28 MED ORDER — GLYCOPYRROLATE 0.2 MG/ML IJ SOLN
INTRAMUSCULAR | Status: DC | PRN
  Administered 2013-04-28: .6 mg via INTRAVENOUS

## 2013-04-28 MED ORDER — FENTANYL CITRATE 0.05 MG/ML IJ SOLN
INTRAMUSCULAR | Status: DC | PRN
  Administered 2013-04-28: 100 ug via INTRAVENOUS

## 2013-04-28 MED ORDER — FERROUS SULFATE 325 (65 FE) MG PO TABS
325.0000 mg | ORAL_TABLET | Freq: Three times a day (TID) | ORAL | Status: DC
  Administered 2013-04-28 – 2013-05-01 (×7): 325 mg via ORAL
  Filled 2013-04-28 (×12): qty 1

## 2013-04-28 MED ORDER — METHOCARBAMOL 500 MG PO TABS
500.0000 mg | ORAL_TABLET | Freq: Four times a day (QID) | ORAL | Status: DC | PRN
  Administered 2013-04-28 – 2013-05-01 (×4): 500 mg via ORAL
  Filled 2013-04-28 (×4): qty 1

## 2013-04-28 MED ORDER — DOCUSATE SODIUM 100 MG PO CAPS
100.0000 mg | ORAL_CAPSULE | Freq: Two times a day (BID) | ORAL | Status: DC
  Administered 2013-04-28 – 2013-04-29 (×3): 100 mg via ORAL

## 2013-04-28 MED ORDER — CEFAZOLIN SODIUM-DEXTROSE 2-3 GM-% IV SOLR: 2.0000 g | INTRAVENOUS | Status: DC

## 2013-04-28 MED ORDER — LIDOCAINE HCL (CARDIAC) 20 MG/ML IV SOLN
INTRAVENOUS | Status: DC | PRN
  Administered 2013-04-28: 80 mg via INTRAVENOUS

## 2013-04-28 MED ORDER — 0.9 % SODIUM CHLORIDE (POUR BTL) OPTIME
TOPICAL | Status: DC | PRN
  Administered 2013-04-28: 1000 mL

## 2013-04-28 MED ORDER — FENOFIBRATE 160 MG PO TABS
160.0000 mg | ORAL_TABLET | Freq: Every day | ORAL | Status: DC
  Administered 2013-04-29 – 2013-05-01 (×3): 160 mg via ORAL
  Filled 2013-04-28 (×4): qty 1

## 2013-04-28 MED ORDER — DIPHENHYDRAMINE HCL 12.5 MG/5ML PO ELIX: 25.0000 mg | ORAL_SOLUTION | Freq: Four times a day (QID) | ORAL | Status: DC | PRN

## 2013-04-28 MED ORDER — PROPOFOL 10 MG/ML IV BOLUS
INTRAVENOUS | Status: DC | PRN
  Administered 2013-04-28: 150 mg via INTRAVENOUS

## 2013-04-28 MED ORDER — CEFAZOLIN SODIUM-DEXTROSE 2-3 GM-% IV SOLR
2.0000 g | Freq: Four times a day (QID) | INTRAVENOUS | Status: AC
  Administered 2013-04-28 (×2): 2 g via INTRAVENOUS
  Filled 2013-04-28 (×2): qty 50

## 2013-04-28 MED ORDER — ROCURONIUM BROMIDE 100 MG/10ML IV SOLN
INTRAVENOUS | Status: DC | PRN
  Administered 2013-04-28: 40 mg via INTRAVENOUS
  Administered 2013-04-28: 10 mg via INTRAVENOUS

## 2013-04-28 MED ORDER — HYDROCODONE-ACETAMINOPHEN 7.5-325 MG PO TABS
1.0000 | ORAL_TABLET | ORAL | Status: DC
  Administered 2013-04-28 – 2013-05-01 (×11): 1 via ORAL
  Filled 2013-04-28 (×12): qty 1

## 2013-04-28 MED ORDER — DEXAMETHASONE SODIUM PHOSPHATE 10 MG/ML IJ SOLN
INTRAMUSCULAR | Status: DC | PRN
  Administered 2013-04-28: 10 mg via INTRAVENOUS

## 2013-04-28 MED ORDER — CEFAZOLIN SODIUM-DEXTROSE 2-3 GM-% IV SOLR
INTRAVENOUS | Status: DC | PRN
  Administered 2013-04-28: 2 g via INTRAVENOUS

## 2013-04-28 MED ORDER — PROMETHAZINE HCL 25 MG/ML IJ SOLN: 6.2500 mg | INTRAMUSCULAR | Status: DC | PRN

## 2013-04-28 MED ORDER — PHENOL 1.4 % MT LIQD: 1.0000 | OROMUCOSAL | Status: DC | PRN

## 2013-04-28 MED ORDER — ONDANSETRON HCL 4 MG PO TABS: 4.0000 mg | ORAL_TABLET | Freq: Four times a day (QID) | ORAL | Status: DC | PRN

## 2013-04-28 MED ORDER — ZOLPIDEM TARTRATE 5 MG PO TABS: 5.0000 mg | ORAL_TABLET | Freq: Every evening | ORAL | Status: DC | PRN

## 2013-04-28 SURGICAL SUPPLY — 41 items
ADH SKN CLS APL DERMABOND .7 (GAUZE/BANDAGES/DRESSINGS) ×1
BAG SPEC THK2 15X12 ZIP CLS (MISCELLANEOUS)
BAG ZIPLOCK 12X15 (MISCELLANEOUS) ×2 IMPLANT
BLADE SAW SGTL 18X1.27X75 (BLADE) ×2 IMPLANT
CAPT HIP PF MOP ×1 IMPLANT
CLOTH BEACON ORANGE TIMEOUT ST (SAFETY) ×2 IMPLANT
DERMABOND ADVANCED (GAUZE/BANDAGES/DRESSINGS) ×1
DERMABOND ADVANCED .7 DNX12 (GAUZE/BANDAGES/DRESSINGS) ×1 IMPLANT
DRAPE C-ARM 42X120 X-RAY (DRAPES) ×2 IMPLANT
DRAPE STERI IOBAN 125X83 (DRAPES) ×2 IMPLANT
DRAPE U-SHAPE 47X51 STRL (DRAPES) ×4 IMPLANT
DRSG AQUACEL AG ADV 3.5X10 (GAUZE/BANDAGES/DRESSINGS) ×2 IMPLANT
DRSG TEGADERM 4X4.75 (GAUZE/BANDAGES/DRESSINGS) IMPLANT
DURAPREP 26ML APPLICATOR (WOUND CARE) ×2 IMPLANT
ELECT BLADE TIP CTD 4 INCH (ELECTRODE) ×2 IMPLANT
ELECT REM PT RETURN 9FT ADLT (ELECTROSURGICAL) ×2
ELECTRODE REM PT RTRN 9FT ADLT (ELECTROSURGICAL) ×1 IMPLANT
EVACUATOR 1/8 PVC DRAIN (DRAIN) IMPLANT
FACESHIELD LNG OPTICON STERILE (SAFETY) ×8 IMPLANT
GAUZE SPONGE 2X2 8PLY STRL LF (GAUZE/BANDAGES/DRESSINGS) ×1 IMPLANT
GLOVE BIOGEL PI IND STRL 7.5 (GLOVE) ×1 IMPLANT
GLOVE BIOGEL PI IND STRL 8 (GLOVE) ×1 IMPLANT
GLOVE BIOGEL PI INDICATOR 7.5 (GLOVE) ×1
GLOVE BIOGEL PI INDICATOR 8 (GLOVE) ×1
GLOVE ECLIPSE 8.0 STRL XLNG CF (GLOVE) ×2 IMPLANT
GLOVE ORTHO TXT STRL SZ7.5 (GLOVE) ×4 IMPLANT
GOWN BRE IMP PREV XXLGXLNG (GOWN DISPOSABLE) ×3 IMPLANT
GOWN STRL NON-REIN LRG LVL3 (GOWN DISPOSABLE) ×2 IMPLANT
KIT BASIN OR (CUSTOM PROCEDURE TRAY) ×2 IMPLANT
PACK TOTAL JOINT (CUSTOM PROCEDURE TRAY) ×2 IMPLANT
PADDING CAST COTTON 6X4 STRL (CAST SUPPLIES) ×2 IMPLANT
SPONGE GAUZE 2X2 STER 10/PKG (GAUZE/BANDAGES/DRESSINGS)
SUCTION FRAZIER 12FR DISP (SUCTIONS) ×1 IMPLANT
SUT MNCRL AB 4-0 PS2 18 (SUTURE) ×2 IMPLANT
SUT VIC AB 1 CT1 36 (SUTURE) ×7 IMPLANT
SUT VIC AB 2-0 CT1 27 (SUTURE) ×4
SUT VIC AB 2-0 CT1 TAPERPNT 27 (SUTURE) ×2 IMPLANT
SUT VLOC 180 0 24IN GS25 (SUTURE) ×2 IMPLANT
TOWEL OR 17X26 10 PK STRL BLUE (TOWEL DISPOSABLE) ×3 IMPLANT
TOWEL OR NON WOVEN STRL DISP B (DISPOSABLE) ×1 IMPLANT
TRAY FOLEY METER SIL LF 16FR (CATHETERS) ×1 IMPLANT

## 2013-04-28 NOTE — Progress Notes (Signed)
Utilization review completed.  

## 2013-04-28 NOTE — Progress Notes (Signed)
Clinical Social Work Department BRIEF PSYCHOSOCIAL ASSESSMENT 04/28/2013  Patient:  ESLI, JERNIGAN     Account Number:  [id number]     Admit date:  04/28/2013  Clinical Social Worker:  Candie Chroman  Date/Time:  04/28/2013 01:05 PM  Referred by:  CSW  Date Referred:  04/28/2013 Referred for  SNF Placement   Other Referral:   Interview type:   Other interview type:    PSYCHOSOCIAL DATA Living Status:  FACILITY Admitted from facility:  [facility] Level of care:  Independent Living Primary support name:  [place] Primary support relationship to patient:  CHILD, ADULT Degree of support available:   supportive    CURRENT CONCERNS Current Concerns  Post-Acute Placement   Other Concerns:    SOCIAL WORK ASSESSMENT / PLAN Pt is an 79 yr old gentleman living in his independent apt at [facility] prior to hospitalization. CSW met with pt /  family to assist with d/c planning. Pt would like to return to his independent apt following hospital d/c. PT EVAL is pending. [facility] able to offer a ST SNF bed , if needed. CSW will meet again with pt / family following PT recommendations.   Assessment/plan status:  Psychosocial Support/Ongoing Assessment of Needs Other assessment/ plan:   Home vs SNF   Information/referral to community resources:   None needed at this time.    PATIENT'S/FAMILY'S RESPONSE TO PLAN OF CARE: Pt would like to return to his apt but will accept ST SNF at [facility] if needed.     Cori Razor LCSW [phone]

## 2013-04-28 NOTE — Op Note (Signed)
NAME:  [name]                ACCOUNT NO.: [id number]      MEDICAL RECORD NO.: [id number]      FACILITY:  [facility]      PHYSICIAN:  Durene Romans D  DATE OF BIRTH:  01/13/1924     DATE OF PROCEDURE:  04/28/2013                                 OPERATIVE REPORT         PREOPERATIVE DIAGNOSIS: Right  hip osteoarthritis.      POSTOPERATIVE DIAGNOSIS:  Right hip osteoarthritis.      PROCEDURE:  Right total hip replacement through an anterior approach   utilizing DePuy THR system, component size 54 pinnacle cup, a size 36+4 neutral   Altrex liner, a size 7Hi Tri Lock stem with a 36+1.5 metal ball.      SURGEON:  Madlyn Frankel. Charlann Boxer, M.D.      ASSISTANT:  Leilani Able, PA-C      ANESTHESIA:  General.      SPECIMENS:  None.      COMPLICATIONS:  None.      BLOOD LOSS:  400 cc     DRAINS:  One Hemovac.      INDICATION OF THE PROCEDURE:  [name] is a 79 y.o. male who had   presented to office for evaluation of right hip pain.  Radiographs revealed   progressive degenerative changes with bone-on-bone   articulation to the  hip joint.  The patient had painful limited range of   motion significantly affecting their overall quality of life.  The patient was failing to    respond to conservative measures, and at this point was ready   to proceed with more definitive measures.  The patient has noted progressive   degenerative changes in his hip, progressive problems and dysfunction   with regarding the hip prior to surgery.  Consent was obtained for   benefit of pain relief.  Specific risk of infection, DVT, component   failure, dislocation, need for revision surgery, as well discussion of   the anterior versus posterior approach were reviewed.  Consent was   obtained for benefit of anterior pain relief through an anterior   approach.      PROCEDURE IN DETAIL:  The patient was brought to operative theater.   Once adequate anesthesia, preoperative  antibiotics, 2gm Ancef administered.   The patient was positioned supine on the OSI Hanna table.  Once adequate   padding of boney process was carried out, we had predraped out the hip, and  used fluoroscopy to confirm orientation of the pelvis and position.      The right hip was then prepped and draped from proximal iliac crest to   mid thigh with shower curtain technique.      Time-out was performed identifying the patient, planned procedure, and   extremity.     An incision was then made 2 cm distal and lateral to the   anterior superior iliac spine extending over the orientation of the   tensor fascia lata muscle and sharp dissection was carried down to the   fascia of the muscle and protractor placed in the soft tissues.      The fascia was then incised.  The muscle belly was identified and swept   laterally  and retractor placed along the superior neck.  Following   cauterization of the circumflex vessels and removing some pericapsular   fat, a second cobra retractor was placed on the inferior neck.  A third   retractor was placed on the anterior acetabulum after elevating the   anterior rectus.  A L-capsulotomy was along the line of the   superior neck to the trochanteric fossa, then extended proximally and   distally.  Tag sutures were placed and the retractors were then placed   intracapsular.  We then identified the trochanteric fossa and   orientation of my neck cut, confirmed this radiographically   and then made a neck osteotomy with the femur on traction.  The femoral   head was removed without difficulty or complication.  Traction was let   off and retractors were placed posterior and anterior around the   acetabulum.      The labrum and foveal tissue were debrided.  I began reaming with a 47mm   reamer and reamed up to 53mm reamer with good bony bed preparation and a 54   cup was chosen.  The final 54mm Pinnacle cup was then impacted under fluoroscopy  to confirm the  depth of penetration and orientation with respect to   abduction.  A screw was placed followed by the hole eliminator.  The final   36+4 Altrex liner was impacted with good visualized rim fit.  The cup was positioned anatomically within the acetabular portion of the pelvis.      At this point, the femur was rolled at 80 degrees.  Further capsule was   released off the inferior aspect of the femoral neck.  I then   released the superior capsule proximally.  The hook was placed laterally   along the femur and elevated manually and held in position with the bed   hook.  The leg was then extended and adducted with the leg rolled to 100   degrees of external rotation.  Once the proximal femur was fully   exposed, I used a box osteotome to set orientation.  I then began   broaching with the starting chili pepper broach and passed this by hand and then broached up to 7.  With the 7 broach in place I chose a high offset neck and did a trial reduction with the 36+1.5.  The offset was appropriate, leg lengths   appeared to be equal, confirmed radiographically.   Given these findings, I went ahead and dislocated the hip, repositioned all   retractors and positioned the right hip in the extended and abducted position.  The final 7 Hi Tri Lock stem was   chosen and it was impacted down to the level of neck cut.  Based on this   and the trial reduction, a 36 +1.5 metal ball was chosen and   impacted onto a clean and dry trunnion, and the hip was reduced.  The   hip had been irrigated throughout the case again at this point.  I did   reapproximate the superior capsular leaflet to the anterior leaflet   using #1 Vicryl, placed a medium Hemovac drain deep.  The fascia of the   tensor fascia lata muscle was then reapproximated using #1 Vicryl.  The   remaining wound was closed with 2-0 Vicryl and running 4-0 Monocryl.   The hip was cleaned, dried, and dressed sterilely using Dermabond and   Aquacel dressing.   Drain site dressed separately.  She  was then brought   to recovery room in stable condition tolerating the procedure well.    Leilani Able, PA-C was present for the entirety of the case involved from   preoperative positioning, perioperative retractor management, general   facilitation of the case, as well as primary wound closure as assistant.            Madlyn Frankel Charlann Boxer, M.D.            MDO/MEDQ  D:  07/24/2011  T:  07/24/2011  Job:  [id number]      Electronically Signed by Durene Romans M.D. on 07/30/2011 09:15:38 AM

## 2013-04-28 NOTE — Interval H&P Note (Signed)
History and Physical Interval Note:  04/28/2013 7:02 AM  [name]  has presented today for surgery, with the diagnosis of RIGHT HIP OSTEOARTHRITIS  The various methods of treatment have been discussed with the patient and family. After consideration of risks, benefits and other options for treatment, the patient has consented to  Procedure(s): RIGHT TOTAL HIP ARTHROPLASTY ANTERIOR APPROACH (Right) as a surgical intervention .  The patient's history has been reviewed, patient examined, no change in status, stable for surgery.  I have reviewed the patient's chart and labs.  Questions were answered to the patient's satisfaction.     [name]

## 2013-04-28 NOTE — Evaluation (Signed)
Physical Therapy Evaluation Patient Details Name: [name] MRN: [id number] DOB: 1923-12-19 Today's Date: 04/28/2013 Time: 5621-3086 PT Time Calculation (min): 15 min  PT Assessment / Plan / Recommendation History of Present Illness  79 yo male s/p R THA-direct anterior 7/29.   Clinical Impression  On eval POD 0, pt required Min assist for mobility-able to ambulate ~50 feet with RW. Pt lives alone in independent living facility at [place]. Recommend ST rehab at SNF to maximize and improve independence, strength, ROM, and overall functional mobility in order to return to PLOF.     PT Assessment  Patient needs continued PT services    Follow Up Recommendations  SNF    Does the patient have the potential to tolerate intense rehabilitation      Barriers to Discharge        Equipment Recommendations  None recommended by PT    Recommendations for Other Services OT consult   Frequency 7X/week    Precautions / Restrictions Precautions Precautions: Fall Restrictions Weight Bearing Restrictions: No RLE Weight Bearing: Weight bearing as tolerated   Pertinent Vitals/Pain R hip "stiff". Pt denies pain.       Mobility  Bed Mobility Bed Mobility: Supine to Sit Supine to Sit: 4: Min assist;HOB elevated;With rails Details for Bed Mobility Assistance: Assist for R LE off bed. Increased time. VCs safety, technique, hand placement.  Transfers Transfers: Sit to Stand;Stand to Sit Sit to Stand: 4: Min assist;From bed Stand to Sit: 4: Min assist;To chair/3-in-1;With armrests Details for Transfer Assistance: Assist to rise, stabilize, control descent. VCS safety, technique, hand placement Ambulation/Gait Ambulation/Gait Assistance: 4: Min assist Ambulation Distance (Feet): 50 Feet Assistive device: Rolling walker Ambulation/Gait Assistance Details: VCS safety, technique, sequence, distance from RW. Assist to stabilize throughout ambulation.  Gait Pattern: Step-to  pattern;Step-through pattern;Decreased stride length    Exercises     PT Diagnosis: Difficulty walking;Abnormality of gait;Acute pain  PT Problem List: Decreased strength;Decreased range of motion;Decreased mobility;Decreased balance;Pain PT Treatment Interventions: DME instruction;Gait training;Functional mobility training;Therapeutic activities;Therapeutic exercise;Patient/family education     PT Goals(Current goals can be found in the care plan section) Acute Rehab PT Goals Patient Stated Goal: regain independence. home PT Goal Formulation: With patient/family Time For Goal Achievement: 05/05/13 Potential to Achieve Goals: Good  Visit Information  Last PT Received On: 04/28/13 Assistance Needed: +1 History of Present Illness: 79 yo male s/p R THA-direct anterior 7/29.        Prior Functioning  Home Living Family/patient expects to be discharged to:: Private residence Living Arrangements: Alone Available Help at Discharge: Family Type of Home: Independent living facility Home Access: Level entry Home Layout: One level Home Equipment: Environmental consultant - 2 wheels;Cane - single point;Adaptive equipment;Grab bars - tub/shower;Grab bars - toilet Adaptive Equipment: Reacher Prior Function Level of Independence: Independent with assistive device(s) Communication Communication: HOH    Cognition  Cognition Arousal/Alertness: Awake/alert Behavior During Therapy: WFL for tasks assessed/performed Overall Cognitive Status: Within Functional Limits for tasks assessed    Extremity/Trunk Assessment Upper Extremity Assessment Upper Extremity Assessment: Overall WFL for tasks assessed Lower Extremity Assessment Lower Extremity Assessment: RLE deficits/detail RLE Deficits / Details: hip flex 2/5, hip abd/add 2/5, moves ankle well Cervical / Trunk Assessment Cervical / Trunk Assessment: Normal   Balance    End of Session PT - End of Session Activity Tolerance: Patient tolerated treatment  well Patient left: in chair;with call bell/phone within reach;with family/visitor present  GP     [name], MPT Pager: [phone]

## 2013-04-28 NOTE — Anesthesia Postprocedure Evaluation (Signed)
Anesthesia Post Note  Patient: [name]  Procedure(s) Performed: Procedure(s) (LRB): RIGHT TOTAL HIP ARTHROPLASTY ANTERIOR APPROACH (Right)  Anesthesia type: General  Patient location: PACU  Post pain: Pain level controlled  Post assessment: Post-op Vital signs reviewed  Last Vitals:  Filed Vitals:   04/28/13 1131  BP: 127/74  Pulse: 102  Temp: 36.6 C  Resp: 16    Post vital signs: Reviewed  Level of consciousness: sedated  Complications: No apparent anesthesia complications

## 2013-04-28 NOTE — Transfer of Care (Signed)
Immediate Anesthesia Transfer of Care Note  Patient: [name]  Procedure(s) Performed: Procedure(s): RIGHT TOTAL HIP ARTHROPLASTY ANTERIOR APPROACH (Right)  Patient Location: PACU  Anesthesia Type:General  Level of Consciousness: sedated  Airway & Oxygen Therapy: Patient Spontanous Breathing and Patient connected to face mask oxygen  Post-op Assessment: Report given to PACU RN and Post -op Vital signs reviewed and stable  Post vital signs: Reviewed and stable  Complications: No apparent anesthesia complications

## 2013-04-29 ENCOUNTER — Encounter (HOSPITAL_COMMUNITY): Payer: Self-pay | Admitting: Orthopedic Surgery

## 2013-04-29 DIAGNOSIS — E663 Overweight: Secondary | ICD-10-CM

## 2013-04-29 DIAGNOSIS — D62 Acute posthemorrhagic anemia: Secondary | ICD-10-CM | POA: Diagnosis not present

## 2013-04-29 LAB — GLUCOSE, CAPILLARY
Glucose-Capillary: 147 mg/dL — ABNORMAL HIGH (ref 70–99)
Glucose-Capillary: 138 mg/dL — ABNORMAL HIGH (ref 70–99)
Glucose-Capillary: 162 mg/dL — ABNORMAL HIGH (ref 70–99)
Glucose-Capillary: 184 mg/dL — ABNORMAL HIGH (ref 70–99)

## 2013-04-29 LAB — BASIC METABOLIC PANEL
BUN: 29 mg/dL — ABNORMAL HIGH (ref 6–23)
CO2: 20 mEq/L (ref 19–32)
Calcium: 9 mg/dL (ref 8.4–10.5)
Chloride: 103 mEq/L (ref 96–112)
Creatinine, Ser: 1.29 mg/dL (ref 0.50–1.35)
GFR calc Af Amer: 56 mL/min — ABNORMAL LOW (ref >90)
GFR calc non Af Amer: 48 mL/min — ABNORMAL LOW (ref >90)
Glucose, Bld: 141 mg/dL — ABNORMAL HIGH (ref 70–99)
Potassium: 4.2 mEq/L (ref 3.5–5.1)
Sodium: 135 mEq/L (ref 135–145)

## 2013-04-29 LAB — CBC
HCT: 30.2 % — ABNORMAL LOW (ref 39.0–52.0)
Hemoglobin: 10.2 g/dL — ABNORMAL LOW (ref 13.0–17.0)
MCH: 32.4 pg (ref 26.0–34.0)
MCHC: 33.8 g/dL (ref 30.0–36.0)
MCV: 95.9 fL (ref 78.0–100.0)
Platelets: 317 10*3/uL (ref 150–400)
RBC: 3.15 MIL/uL — ABNORMAL LOW (ref 4.22–5.81)
RDW: 13.9 % (ref 11.5–15.5)
WBC: 12.5 10*3/uL — ABNORMAL HIGH (ref 4.0–10.5)

## 2013-04-29 MED ORDER — HYDROCODONE-ACETAMINOPHEN 7.5-325 MG PO TABS: 1.0000 | ORAL_TABLET | ORAL | Status: DC | PRN

## 2013-04-29 NOTE — Progress Notes (Signed)
Physical Therapy Treatment Patient Details Name: [name] MRN: [id number] DOB: [birth year] Today's Date: 04/29/2013 Time: 1347-1400 PT Time Calculation (min): 13 min  PT Assessment / Plan / Recommendation  History of Present Illness 79 yo male s/p R THA-direct anterior 7/29.    PT Comments   POD # 1 R THR DAA pm session.  Assisted pt out of recliner to amb in hallway then assisted back to bed per pt request.   Follow Up Recommendations  SNF     Does the patient have the potential to tolerate intense rehabilitation     Barriers to Discharge        Equipment Recommendations  None recommended by PT    Recommendations for Other Services    Frequency 7X/week   Progress towards PT Goals Progress towards PT goals: Progressing toward goals  Plan      Precautions / Restrictions Precautions Precautions: Fall Restrictions Weight Bearing Restrictions: No RLE Weight Bearing: Weight bearing as tolerated    Pertinent Vitals/Pain C/o "soreness" ICE applied    Mobility  Bed Mobility Bed Mobility: Sit to Supine Sit to Supine: 4: Min assist;4: Min guard Details for Bed Mobility Assistance: assisted back to bed with assist for R LE up onto bed Transfers Transfers: Sit to Stand;Stand to Sit Sit to Stand: 4: Min guard;4: Min assist;From chair/3-in-1 Stand to Sit: 4: Min guard;4: Min assist;To bed Details for Transfer Assistance: 25% VC's on proper tech and hand placement Ambulation/Gait Ambulation/Gait Assistance: 4: Min Environmental consultant (Feet): 57 Feet Assistive device: Rolling walker Ambulation/Gait Assistance Details: increased time and one VC on safety with turns Gait Pattern: Step-to pattern;Step-through pattern;Decreased stride length Gait velocity: decreased    PT Goals (current goals can now be found in the care plan section)    Visit Information  Last PT Received On: 04/29/13 Assistance Needed: +1 History of Present Illness: 79 yo male s/p R THA-direct  anterior 7/29.     Subjective Data      Cognition       Balance     End of Session PT - End of Session Equipment Utilized During Treatment: Gait belt Activity Tolerance: Patient tolerated treatment well Patient left: in bed;with call bell/phone within reach   Felecia Shelling  PTA [facility]  Acute  Rehab Pager      [phone]

## 2013-04-29 NOTE — Progress Notes (Signed)
   Subjective: 1 Day Post-Op Procedure(s) (LRB): RIGHT TOTAL HIP ARTHROPLASTY ANTERIOR APPROACH (Right)   Patient reports pain as mild, pain well controlled. No events throughout the night. Was a little slow with PT yesterday. Will see how well he works with PT today.   Objective:   VITALS:   Filed Vitals:   04/29/13 0725  BP: 171/78  Pulse: 100  Temp: 97.8 F (36.6 C)  Resp: 14    Neurovascular intact Dorsiflexion/Plantar flexion intact Incision: dressing C/D/I No cellulitis present Compartment soft  LABS  Recent Labs  04/29/13 0415  HGB 10.2*  HCT 30.2*  WBC 12.5*  PLT 317     Recent Labs  04/29/13 0415  NA 135  K 4.2  BUN 29*  CREATININE 1.29  GLUCOSE 141*     Assessment/Plan: 1 Day Post-Op Procedure(s) (LRB): RIGHT TOTAL HIP ARTHROPLASTY ANTERIOR APPROACH (Right) Foley cath d/c'ed Advance diet Up with therapy D/C IV fluids Plan is to return to independent living if he does well with PT or go back to the rehab area if extra assistance is needed.  Expected ABLA  Treated with iron and will observe  Overweight (BMI 25-29.9) Estimated body mass index is 27.6 kg/(m^2) as calculated from the following:   Height as of this encounter: 5\' 9"  (1.753 m).   Weight as of this encounter: 84.823 kg (187 lb). Patient also counseled that weight may inhibit the healing process Patient counseled that losing weight will help with future health issues      Anastasio Auerbach. Naftuli Dalsanto   PAC  04/29/2013, 8:51 AM

## 2013-04-29 NOTE — Evaluation (Signed)
Occupational Therapy Evaluation Patient Details Name: [name] MRN: [id number] DOB: 20-Jun-1924 Today's Date: 04/29/2013 Time: 1478-2956 OT Time Calculation (min): 24 min  OT Assessment / Plan / Recommendation History of present illness     Clinical Impression   Pt was admitted for R anterior direct THA.  He will benefit from continued OT to increase independence with adls.  Goals in acute are for supervision level.      OT Assessment  Patient needs continued OT Services    Follow Up Recommendations  SNF    Barriers to Discharge Decreased caregiver support    Equipment Recommendations  3 in 1 bedside comode (possibly, depending on toilet height)    Recommendations for Other Services    Frequency  Min 2X/week    Precautions / Restrictions Precautions Precautions: Fall Restrictions Weight Bearing Restrictions: No RLE Weight Bearing: Weight bearing as tolerated   Pertinent Vitals/Pain No pain, but c/o stiffness.  Dyspnea 2/4.  sats 100% RA    ADL  Grooming: Set up;Wash/dry face;Wash/dry hands Where Assessed - Grooming: Unsupported sitting Upper Body Bathing: Set up Where Assessed - Upper Body Bathing: Unsupported sitting Lower Body Bathing: Moderate assistance Where Assessed - Lower Body Bathing: Supported sit to stand Upper Body Dressing: Minimal assistance (iv) Where Assessed - Upper Body Dressing: Unsupported sitting Lower Body Dressing: Maximal assistance Where Assessed - Lower Body Dressing: Supported sit to stand Toilet Transfer: Performed;Minimal assistance Toilet Transfer Method: Sit to Barista: Grab bars;Comfort height toilet Toileting - Clothing Manipulation and Hygiene: Minimal assistance Where Assessed - Engineer, mining and Hygiene: Sit to stand from 3-in-1 or toilet Equipment Used: Reacher Transfers/Ambulation Related to ADLs: pt ambulated to bathroom.  Dyspnea 2/4; sats 100% on RA ADL Comments: Performed adl  this am.  Pt usually uses a reacher for adls at home    OT Diagnosis: Generalized weakness  OT Problem List: Decreased strength;Decreased activity tolerance;Impaired balance (sitting and/or standing);Decreased knowledge of use of DME or AE OT Treatment Interventions: Self-care/ADL training;DME and/or AE instruction;Patient/family education   OT Goals(Current goals can be found in the care plan section) Acute Rehab OT Goals Patient Stated Goal: regain independence. home OT Goal Formulation: With patient Time For Goal Achievement: 05/06/13 Potential to Achieve Goals: Good ADL Goals Pt Will Perform Lower Body Bathing: with supervision;with adaptive equipment;sit to/from stand Pt Will Perform Lower Body Dressing: with supervision;with adaptive equipment;sit to/from stand Pt Will Transfer to Toilet: with supervision;ambulating;bedside commode Pt Will Perform Toileting - Clothing Manipulation and hygiene: with supervision;sit to/from stand  Visit Information  Last OT Received On: 04/29/13 Assistance Needed: +1       Prior Functioning     Home Living Family/patient expects to be discharged to:: Private residence Living Arrangements: Alone Home Equipment: Environmental consultant - 2 wheels;Cane - single point;Adaptive equipment;Grab bars - tub/shower;Grab bars - toilet Additional Comments: plans STSNF Prior Function Level of Independence: Independent with assistive device(s) Communication Communication: HOH         Vision/Perception     Cognition  Cognition Arousal/Alertness: Awake/alert Behavior During Therapy: WFL for tasks assessed/performed Overall Cognitive Status: Within Functional Limits for tasks assessed    Extremity/Trunk Assessment Upper Extremity Assessment Upper Extremity Assessment: Overall WFL for tasks assessed     Mobility Bed Mobility Supine to Sit: 4: Min assist;HOB elevated;With rails Transfers Sit to Stand: 4: Min assist;From bed;From toilet Details for Transfer  Assistance: cues for hand/foot placement     Exercise     Balance  End of Session OT - End of Session Activity Tolerance: Patient tolerated treatment well Patient left: with call bell/phone within reach;in chair  GO     [name] 04/29/2013, 9:15 AM Marica Otter, OTR/L [phone] 04/29/2013

## 2013-04-29 NOTE — Progress Notes (Signed)
CSW met with pt/family this am to assist with d/c planning. Pt has decided to go to SNF at [facility] when ready for d/c. CSW will continue to assist with d/c planning to SNF.  Cori Razor LCSW [phone]

## 2013-04-29 NOTE — Progress Notes (Signed)
Physical Therapy Treatment Patient Details Name: [name] MRN: [id number] DOB: [birth year] Today's Date: 04/29/2013 Time: 4098-1191 PT Time Calculation (min): 26 min  PT Assessment / Plan / Recommendation  History of Present Illness 79 yo male s/p R THA-direct anterior 7/29.    PT Comments   POD # 1 R THR DAA am session.  Pt OOB in recliner feeling good.  Amb in hallway then performed TE's in recliner.  Pt plans to D/C to [facility] for ST Rehab.   Follow Up Recommendations  SNF     Does the patient have the potential to tolerate intense rehabilitation     Barriers to Discharge        Equipment Recommendations  None recommended by PT    Recommendations for Other Services    Frequency 7X/week   Progress towards PT Goals Progress towards PT goals: Progressing toward goals  Plan      Precautions / Restrictions Precautions Precautions: Fall Restrictions Weight Bearing Restrictions: No RLE Weight Bearing: Weight bearing as tolerated    Pertinent Vitals/Pain C/O "soreness' ICE applied     Mobility  Bed Mobility Bed Mobility: Not assessed Details for Bed Mobility Assistance: Pt OOB in recliner Transfers Transfers: Sit to Stand;Stand to Sit Sit to Stand: 4: Min assist;From toilet;From chair/3-in-1 Stand to Sit: 4: Min assist;To chair/3-in-1;With armrests Details for Transfer Assistance: cues for hand/foot placement Ambulation/Gait Ambulation/Gait Assistance: 4: Min assist Ambulation Distance (Feet): 42 Feet Assistive device: Rolling walker Ambulation/Gait Assistance Details: 25% VC's on proper sequencing and upright posture plus increased time Gait Pattern: Step-to pattern;Step-through pattern;Decreased stride length Gait velocity: decreased   Total Hip Replacement TE's 10 reps ankle pumps 10 reps knee presses 10 reps heel slides 10 reps SAQ's 10 reps ABD Followed by ICE   PT Goals (current goals can now be found in the care plan section)    Visit  Information  Last PT Received On: 04/29/13 Assistance Needed: +1 History of Present Illness: 79 yo male s/p R THA-direct anterior 7/29.     Subjective Data      Cognition       Balance   fair  End of Session PT - End of Session Equipment Utilized During Treatment: Gait belt Activity Tolerance: Patient tolerated treatment well Patient left: in chair;with call bell/phone within reach;with family/visitor present   Felecia Shelling  PTA [facility]  Acute  Rehab Pager      [phone]

## 2013-04-30 LAB — BASIC METABOLIC PANEL
BUN: 31 mg/dL — ABNORMAL HIGH (ref 6–23)
CO2: 22 mEq/L (ref 19–32)
Calcium: 9.2 mg/dL (ref 8.4–10.5)
Chloride: 102 mEq/L (ref 96–112)
Creatinine, Ser: 1.23 mg/dL (ref 0.50–1.35)
GFR calc Af Amer: 59 mL/min — ABNORMAL LOW (ref >90)
GFR calc non Af Amer: 51 mL/min — ABNORMAL LOW (ref >90)
Glucose, Bld: 128 mg/dL — ABNORMAL HIGH (ref 70–99)
Potassium: 4.3 mEq/L (ref 3.5–5.1)
Sodium: 134 mEq/L — ABNORMAL LOW (ref 135–145)

## 2013-04-30 LAB — CBC
HCT: 29.3 % — ABNORMAL LOW (ref 39.0–52.0)
Hemoglobin: 9.7 g/dL — ABNORMAL LOW (ref 13.0–17.0)
MCH: 31.6 pg (ref 26.0–34.0)
MCHC: 33.1 g/dL (ref 30.0–36.0)
MCV: 95.4 fL (ref 78.0–100.0)
Platelets: 296 10*3/uL (ref 150–400)
RBC: 3.07 MIL/uL — ABNORMAL LOW (ref 4.22–5.81)
RDW: 14.1 % (ref 11.5–15.5)
WBC: 10.9 10*3/uL — ABNORMAL HIGH (ref 4.0–10.5)

## 2013-04-30 LAB — GLUCOSE, CAPILLARY
Glucose-Capillary: 132 mg/dL — ABNORMAL HIGH (ref 70–99)
Glucose-Capillary: 130 mg/dL — ABNORMAL HIGH (ref 70–99)
Glucose-Capillary: 173 mg/dL — ABNORMAL HIGH (ref 70–99)
Glucose-Capillary: 170 mg/dL — ABNORMAL HIGH (ref 70–99)

## 2013-04-30 MED ORDER — DSS 100 MG PO CAPS: 100.0000 mg | ORAL_CAPSULE | Freq: Two times a day (BID) | ORAL | Status: DC

## 2013-04-30 MED ORDER — TIZANIDINE HCL 4 MG PO CAPS: 4.0000 mg | ORAL_CAPSULE | Freq: Three times a day (TID) | ORAL | Status: DC | PRN

## 2013-04-30 MED ORDER — ASPIRIN 325 MG PO TBEC: 325.0000 mg | DELAYED_RELEASE_TABLET | Freq: Two times a day (BID) | ORAL | Status: AC

## 2013-04-30 MED ORDER — POLYETHYLENE GLYCOL 3350 17 G PO PACK: 17.0000 g | PACK | Freq: Every day | ORAL | Status: DC | PRN

## 2013-04-30 MED ORDER — FERROUS SULFATE 325 (65 FE) MG PO TABS: 325.0000 mg | ORAL_TABLET | Freq: Three times a day (TID) | ORAL | Status: DC

## 2013-04-30 NOTE — Discharge Summary (Signed)
Physician Discharge Summary  Patient ID: ARSHAN JABS MRN: [id number] DOB/AGE: [birth year] 79 y.o.  Admit date: 04/28/2013 Discharge date:  05/01/2013   Procedures:  Procedure(s) (LRB): RIGHT TOTAL HIP ARTHROPLASTY ANTERIOR APPROACH (Right)  Attending Physician:  Dr. Durene Romans   Admission Diagnoses:   Right hip OA / pain  Discharge Diagnoses:  Principal Problem:   S/P right THA, AA Active Problems:   Expected blood loss anemia   Overweight (BMI 25.0-29.9)  Past Medical History  Diagnosis Date  . Hyperlipemia   . CVA (cerebral infarction)   . Hypertension   . Stroke     2011 no deficits   . GERD (gastroesophageal reflux disease)     hx of   . Arthritis   . Ulcer     stomach 2011 related to plavix   . Hx of transfusion of packed red blood cells   . Diabetes mellitus without complication     Diet controlled    HPI: [name], 79 y.o. male, has a history of pain and functional disability in the right hip(s) due to arthritis and patient has failed non-surgical conservative treatments for greater than 12 weeks to include NSAID's and/or analgesics, corticosteriod injections, use of assistive devices and activity modification. Onset of symptoms was gradual starting 1 years ago with rapidlly worsening course since that time.The patient noted no past surgery on the right hip(s). Patient currently rates pain in the right hip at 10 out of 10 with activity. Patient has night pain, worsening of pain with activity and weight bearing, trendelenberg gait, pain that interfers with activities of daily living and pain with passive range of motion. Patient has evidence of periarticular osteophytes and joint space narrowing by imaging studies. This condition presents safety issues increasing the risk of falls. There is no current active signs of infection. Risks, benefits and expectations were discussed with the patient. Patient understand the risks, benefits and expectations and wishes to  proceed with surgery.  PCP: Michiel Sites, MD   Discharged Condition: good  Hospital Course:  Patient underwent the above stated procedure on 04/28/2013. Patient tolerated the procedure well and brought to the recovery room in good condition and subsequently to the floor.  POD #1 BP: 171/78 ; Pulse: 100 ; Temp: 97.8 F (36.6 C) ; Resp: 14  Pt's foley was removed, as well as the hemovac drain removed. IV was changed to a saline lock. Patient reports pain as mild, pain well controlled. No events throughout the night. Was a little slow with PT yesterday. Will see how well he works with PT today.  Neurovascular intact, dorsiflexion/plantar flexion intact, incision: dressing C/D/I, no cellulitis present and compartment soft.   LABS  Basename    HGB  10.2  HCT  30.2   POD #2  BP: 149/80 ; Pulse: 99 ; Temp: 98 F (36.7 C) ; Resp: 14 Patient reports pain as mild, pain well controlled. States that pain was a little more today than yesterday, but feels better after getting a muscle relaxer. No events throughout the night, states he slept well when he was able to throughout the night. Neurovascular intact, dorsiflexion/plantar flexion intact, incision: dressing C/D/I, no cellulitis present and compartment soft.   LABS  Basename    HGB  9.7  HCT  29.3   POD #3  BP: 130/75 ; Pulse: 89 ; Temp: 98 F (36.7 C) ; Resp: 16  Patient reports pain as mild, pain well controlled. No events throughout the night. Ready  to be discharged to SNF. Neurovascular intact, dorsiflexion/plantar flexion intact, incision: dressing C/D/I, no cellulitis present and compartment soft.   LABS   No new labs  Discharge Exam: General appearance: alert, cooperative and no distress Extremities: Homans sign is negative, no sign of DVT, no edema, redness or tenderness in the calves or thighs and no ulcers, gangrene or trophic changes  Disposition: SNF with follow up in 2 weeks   Follow-up Information   Follow up  with [name], MD. Schedule an appointment as soon as possible for a visit in 2 weeks.   Contact information:   [address] [place] [address] [phone]       Discharge Orders   Future Orders Complete By Expires     Call MD / Call 911  As directed     Comments:      If you experience chest pain or shortness of breath, CALL 911 and be transported to the hospital emergency room.  If you develope a fever above 101 F, pus (white drainage) or increased drainage or redness at the wound, or calf pain, call your surgeon's office.    Change dressing  As directed     Comments:      Maintain surgical dressing for 10-14 days, then replace with 4x4 guaze and tape. Keep the area dry and clean.    Constipation Prevention  As directed     Comments:      Drink plenty of fluids.  Prune juice may be helpful.  You may use a stool softener, such as Colace (over the counter) 100 mg twice a day.  Use MiraLax (over the counter) for constipation as needed.    Diet - low sodium heart healthy  As directed     Discharge instructions  As directed     Comments:      Maintain surgical dressing for 10-14 days, then replace with gauze and tape. Keep the area dry and clean until follow up. Follow up in 2 weeks at [facility]. Call with any questions or concerns.    Increase activity slowly as tolerated  As directed     TED hose  As directed     Comments:      Use stockings (TED hose) for 2 weeks on both leg(s).  You may remove them at night for sleeping.    Weight bearing as tolerated  As directed          Medication List    STOP taking these medications       acetaminophen 500 MG tablet  Commonly known as:  TYLENOL     aspirin 81 MG tablet  Replaced by:  aspirin 325 MG EC tablet     meloxicam 7.5 MG tablet  Commonly known as:  MOBIC      TAKE these medications       aspirin 325 MG EC tablet  Take 1 tablet (325 mg total) by mouth 2 (two) times daily.      DSS 100 MG Caps  Take 100 mg by mouth 2 (two) times daily.     enalapril 10 MG tablet  Commonly known as:  VASOTEC  Take 20 mg by mouth daily.     ergocalciferol 50000 UNITS capsule  Commonly known as:  VITAMIN D2  Take 50,000 Units by mouth once a week. Patient takes on Mondays     ferrous sulfate 325 (65 FE) MG tablet  Take 1 tablet (325 mg total) by mouth 3 (  three) times daily after meals.     HYDROcodone-acetaminophen 7.5-325 MG per tablet  Commonly known as:  NORCO  Take 1-2 tablets by mouth every 4 (four) hours as needed for pain.     pantoprazole 40 MG tablet  Commonly known as:  PROTONIX  Take 40 mg by mouth daily.     polyethylene glycol packet  Commonly known as:  MIRALAX / GLYCOLAX  Take 17 g by mouth daily as needed.     rosuvastatin 20 MG tablet  Commonly known as:  CRESTOR  Take 20 mg by mouth every morning.     tiZANidine 4 MG capsule  Commonly known as:  ZANAFLEX  Take 1 capsule (4 mg total) by mouth 3 (three) times daily as needed for muscle spasms.     TRILIPIX 135 MG capsule  Generic drug:  Choline Fenofibrate  Take 135 mg by mouth every morning.         Signed: Anastasio Auerbach. Kelyn Ponciano   PAC  04/30/2013, 1:30 PM

## 2013-04-30 NOTE — Progress Notes (Signed)
Physical Therapy Treatment Patient Details Name: [name] MRN: [id number] DOB: Nov 07, 1923 Today's Date: 04/30/2013 Time: 0865-7846 PT Time Calculation (min): 28 min  PT Assessment / Plan / Recommendation  History of Present Illness 79 yo male s/p R THA-direct anterior 7/29.    PT Comments   POD# 2 R THR DAA am session deferred due to pt's request to "rest". PM session assisted pt OOB to amb in hallway then back to bed to perform TE's.  Pt c/o cramping R LE which was relieved with activity.   Follow Up Recommendations  SNF     Does the patient have the potential to tolerate intense rehabilitation     Barriers to Discharge        Equipment Recommendations  None recommended by PT    Recommendations for Other Services    Frequency 7X/week   Progress towards PT Goals Progress towards PT goals: Progressing toward goals  Plan      Precautions / Restrictions Precautions Precautions: Fall Restrictions Weight Bearing Restrictions: No RLE Weight Bearing: Weight bearing as tolerated    Pertinent Vitals/Pain C/o "cramping"    Mobility  Bed Mobility Bed Mobility: Supine to Sit;Sit to Supine Supine to Sit: 4: Min assist;HOB elevated;With rails Sit to Supine: 4: Min assist;4: Min guard Details for Bed Mobility Assistance: assist withRLE and increased time Transfers Transfers: Sit to Stand;Stand to Sit Sit to Stand: 4: Min assist;From elevated surface;From bed;With upper extremity assist Stand to Sit: 4: Min guard;4: Min assist;To bed Details for Transfer Assistance: cues for hand and leg placement Ambulation/Gait Ambulation/Gait Assistance: 4: Min assist;4: Min guard Ambulation Distance (Feet): 105 Feet Assistive device: Rolling walker Ambulation/Gait Assistance Details: 25% VC's on proper walker to self distance and safety with turns Gait Pattern: Step-to pattern;Step-through pattern;Decreased stride length Gait velocity: decreased   Total Hip Replacement TE's 10 reps  ankle pumps 10 reps knee presses 10 reps heel slides 10 reps SAQ's 10 reps ABD Followed by ICE   PT Goals (current goals can now be found in the care plan section)    Visit Information  Last PT Received On: 04/30/13 Assistance Needed: +1 History of Present Illness: 79 yo male s/p R THA-direct anterior 7/29.     Subjective Data      Cognition       Balance     End of Session PT - End of Session Equipment Utilized During Treatment: Gait belt Activity Tolerance: Patient tolerated treatment well Patient left: in bed;with call bell/phone within reach   Felecia Shelling  PTA [facility]  Acute  Rehab Pager      [phone]

## 2013-04-30 NOTE — Progress Notes (Signed)
Occupational Therapy Treatment Patient Details Name: [name] MRN: [id number] DOB: 1924/01/03 Today's Date: 04/30/2013 Time: 4098-1191 OT Time Calculation (min): 38 min  OT Assessment / Plan / Recommendation  History of present illness 79 yo male s/p R THA-direct anterior 7/29.    OT comments  Pt is making progress.  Used reacher for [employer].    Follow Up Recommendations  SNF    Barriers to Discharge       Equipment Recommendations  3 in 1 bedside comode    Recommendations for Other Services    Frequency Min 2X/week   Progress towards OT Goals Progress towards OT goals: Progressing toward goals  Plan      Precautions / Restrictions Precautions Precautions: Fall Restrictions RLE Weight Bearing: Weight bearing as tolerated   Pertinent Vitals/Pain Pt did not rate pain:  C/o more stiffness than anything but grimaced with mobility.  Repositioned in chair with ice    ADL  Lower Body Bathing: Minimal assistance (with reacher) Where Assessed - Lower Body Bathing: Supported sit to stand Toilet Transfer: Simulated;Minimal assistance (cues for hand/leg placement) Toilet Transfer Method: Stand pivot Acupuncturist:  (bed to chair) Equipment Used: Reacher Transfers/Ambulation Related to ADLs: pt had difficulty controlling descent to chair.  cues for hand and leg placement ADL Comments: Pt had pain in hip this am. Did not want to stand for grooming:  performed teeth and shaving with electric  razor at eob    OT Diagnosis:    OT Problem List:   OT Treatment Interventions:     OT Goals(current goals can now be found in the care plan section)    Visit Information  Last OT Received On: 04/30/13 Assistance Needed: +1 History of Present Illness: 79 yo male s/p R THA-direct anterior 7/29.     Subjective Data      Prior Functioning       Cognition  Cognition Arousal/Alertness: Awake/alert Behavior During Therapy: WFL for tasks assessed/performed Overall  Cognitive Status: Within Functional Limits for tasks assessed    Mobility  Bed Mobility Supine to Sit: 4: Min assist;HOB elevated;With rails Details for Bed Mobility Assistance: assist withRLE Transfers Sit to Stand: 4: Min assist;From elevated surface;From bed;With upper extremity assist Details for Transfer Assistance: cues for hand and leg placement    Exercises      Balance     End of Session OT - End of Session Activity Tolerance: Patient tolerated treatment well Patient left: with call bell/phone within reach;in chair  GO     Kasra Melvin 04/30/2013, 8:43 AM Marica Otter, OTR/L [phone] 04/30/2013

## 2013-04-30 NOTE — Progress Notes (Signed)
   Subjective: 2 Days Post-Op Procedure(s) (LRB): RIGHT TOTAL HIP ARTHROPLASTY ANTERIOR APPROACH (Right)   Patient reports pain as mild, pain well controlled. States that pain was a little more today than yesterday, but feels better after getting a muscle relaxer.  No events throughout the night, states he slept well when he was able to throughout the night.  Objective:   VITALS:   Filed Vitals:   04/30/13 0624  BP: 149/80  Pulse: 99  Temp: 98 F (36.7 C)  Resp: 14    Neurovascular intact Dorsiflexion/Plantar flexion intact Incision: dressing C/D/I No cellulitis present Compartment soft  LABS  Recent Labs  04/29/13 0415 04/30/13 0410  HGB 10.2* 9.7*  HCT 30.2* 29.3*  WBC 12.5* 10.9*  PLT 317 296     Recent Labs  04/29/13 0415 04/30/13 0410  NA 135 134*  K 4.2 4.3  BUN 29* 31*  CREATININE 1.29 1.23  GLUCOSE 141* 128*     Assessment/Plan: 2 Days Post-Op Procedure(s) (LRB): RIGHT TOTAL HIP ARTHROPLASTY ANTERIOR APPROACH (Right) Up with therapy Discharge home with home health Plan is to return to the skilled area at [facility] to get more PT.   Expected ABLA  Treated with iron and will observe   Overweight (BMI 25-29.9)  Estimated body mass index is 27.6 kg/(m^2) as calculated from the following:      Height as of this encounter: 5\' 9"  (1.753 m).      Weight as of this encounter: 84.823 kg (187 lb).  Patient also counseled that weight may inhibit the healing process  Patient counseled that losing weight will help with future health issues    Anastasio Auerbach. Jie Stickels   PAC  04/30/2013, 1:07 PM

## 2013-05-01 LAB — GLUCOSE, CAPILLARY: Glucose-Capillary: 130 mg/dL — ABNORMAL HIGH (ref 70–99)

## 2013-05-01 NOTE — Progress Notes (Signed)
Pt d/c to [facility] today via [facility] . Pt / family were in agreement with d/c plan.  Cori Razor LCSW [phone]

## 2013-05-01 NOTE — Progress Notes (Signed)
   Subjective: 3 Days Post-Op Procedure(s) (LRB): RIGHT TOTAL HIP ARTHROPLASTY ANTERIOR APPROACH (Right)   Patient reports pain as mild, pain well controlled. No events throughout the night. Ready to be discharged to SNF.  Objective:   VITALS:   Filed Vitals:   05/01/13 0535  BP: 130/75  Pulse: 89  Temp: 98 F (36.7 C)  Resp: 16    Neurovascular intact Dorsiflexion/Plantar flexion intact Incision: dressing C/D/I No cellulitis present Compartment soft  LABS  Recent Labs  04/29/13 0415 04/30/13 0410  HGB 10.2* 9.7*  HCT 30.2* 29.3*  WBC 12.5* 10.9*  PLT 317 296     Recent Labs  04/29/13 0415 04/30/13 0410  NA 135 134*  K 4.2 4.3  BUN 29* 31*  CREATININE 1.29 1.23  GLUCOSE 141* 128*     Assessment/Plan: 3 Days Post-Op Procedure(s) (LRB): RIGHT TOTAL HIP ARTHROPLASTY ANTERIOR APPROACH (Right) Up with therapy Discharge to SNF Follow up in 2 weeks at [facility]. Follow up with OLIN,Ikram Riebe D in 2 weeks.  Contact information:  [facility] [address], [address] [place] [address] [phone]        Anastasio Auerbach. Mehlani Blankenburg   PAC  05/01/2013, 8:07 AM

## 2013-06-13 ENCOUNTER — Emergency Department (INDEPENDENT_AMBULATORY_CARE_PROVIDER_SITE_OTHER)
Admission: EM | Admit: 2013-06-13 | Discharge: 2013-06-13 | Disposition: A | Discharge status: Nursing Home (Includes ICF) | Payer: Medicare Other | Source: Home / Self Care | Attending: Family Medicine | Admitting: Family Medicine

## 2013-06-13 ENCOUNTER — Encounter (HOSPITAL_COMMUNITY): Payer: Self-pay | Admitting: *Deleted

## 2013-06-13 ENCOUNTER — Encounter (HOSPITAL_COMMUNITY): Payer: Self-pay | Admitting: Emergency Medicine

## 2013-06-13 ENCOUNTER — Emergency Department (HOSPITAL_COMMUNITY)
Admission: EM | Admit: 2013-06-13 | Discharge: 2013-06-13 | Disposition: A | Discharge status: Home / Self Care | Payer: Medicare Other | Attending: Emergency Medicine | Admitting: Emergency Medicine

## 2013-06-13 DIAGNOSIS — R531 Weakness: Secondary | ICD-10-CM

## 2013-06-13 DIAGNOSIS — R5381 Other malaise: Secondary | ICD-10-CM | POA: Insufficient documentation

## 2013-06-13 DIAGNOSIS — M129 Arthropathy, unspecified: Secondary | ICD-10-CM | POA: Insufficient documentation

## 2013-06-13 DIAGNOSIS — E119 Type 2 diabetes mellitus without complications: Secondary | ICD-10-CM | POA: Insufficient documentation

## 2013-06-13 DIAGNOSIS — I10 Essential (primary) hypertension: Secondary | ICD-10-CM | POA: Insufficient documentation

## 2013-06-13 DIAGNOSIS — Z7982 Long term (current) use of aspirin: Secondary | ICD-10-CM | POA: Insufficient documentation

## 2013-06-13 DIAGNOSIS — Z79899 Other long term (current) drug therapy: Secondary | ICD-10-CM | POA: Insufficient documentation

## 2013-06-13 DIAGNOSIS — Z8673 Personal history of transient ischemic attack (TIA), and cerebral infarction without residual deficits: Secondary | ICD-10-CM | POA: Insufficient documentation

## 2013-06-13 DIAGNOSIS — E785 Hyperlipidemia, unspecified: Secondary | ICD-10-CM | POA: Insufficient documentation

## 2013-06-13 DIAGNOSIS — K219 Gastro-esophageal reflux disease without esophagitis: Secondary | ICD-10-CM | POA: Insufficient documentation
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
## 2013-06-13 NOTE — ED Notes (Signed)
Dizzy, fainting, numbness, slurred speech.

## 2013-06-14 NOTE — ED Provider Notes (Signed)
Medical screening examination/treatment/procedure(s) were performed by a resident physician or non-physician practitioner and as the supervising physician I was immediately available for consultation/collaboration.  Dudley Mages, MD    Dayla Gasca S Creta Dorame, MD 06/14/13 0845 

## 2013-06-15 ENCOUNTER — Encounter: Payer: Self-pay | Admitting: Cardiovascular Disease

## 2013-06-16 ENCOUNTER — Other Ambulatory Visit (INDEPENDENT_AMBULATORY_CARE_PROVIDER_SITE_OTHER): Payer: Medicare Other | Admitting: Vascular Surgery

## 2013-06-16 ENCOUNTER — Encounter: Payer: Self-pay | Admitting: Cardiovascular Disease

## 2013-06-16 ENCOUNTER — Ambulatory Visit (INDEPENDENT_AMBULATORY_CARE_PROVIDER_SITE_OTHER): Payer: Medicare Other | Admitting: Cardiovascular Disease

## 2013-06-16 VITALS — BP 108/62 | HR 79 | Ht 69.0 in | Wt 176.0 lb

## 2013-06-16 DIAGNOSIS — G459 Transient cerebral ischemic attack, unspecified: Secondary | ICD-10-CM

## 2013-06-16 DIAGNOSIS — R55 Syncope and collapse: Secondary | ICD-10-CM

## 2013-06-16 NOTE — Assessment & Plan Note (Addendum)
Patient has been having episodes of presyncope associated with confusion. It could be due to over medications which was simplified yesterday. Cardiac arrhythmia will have to be excluded as well. His heart rate is a regular but is not consistent with  atrial fibrillation. P waves are small but present. He does have frequent PVCs. I think before we start him on anticoagulation for presumed atrial fibrillation, we have to confirm this especially with his previous history of major GI bleed in 2012. Obviously, if atrial fibrillation is confirmed, anticoagulation will be needed given his previous history of stroke. For now, I asked him to continue aspirin daily and hold off on starting Xarelto.  I recommend a 14 day outpatient telemetry. I will also obtain an echocardiogram given his presyncopal episodes.

## 2013-06-16 NOTE — Patient Instructions (Addendum)
Your physician has recommended that you wear an event monitor. Event monitors are medical devices that record the heart's electrical activity. Doctors most often [place] these monitors to diagnose arrhythmias. Arrhythmias are problems with the speed or rhythm of the heartbeat. The monitor is a small, portable device. You can wear one while you do your normal daily activities. This is usually used to diagnose what is causing palpitations/syncope (passing out).  Your physician has requested that you have an echocardiogram. Echocardiography is a painless test that uses sound waves to create images of your heart. It provides your doctor with information about the size and shape of your heart and how well your heart's chambers and valves are working. This procedure takes approximately one hour. There are no restrictions for this procedure.  Your physician recommends that you schedule a follow-up appointment in: 1 MONTH with Dr Kirke Corin  Your physician has recommended you make the following change in your medication: DO NOT TAKE Xarelto at this time, Continue Aspirin 81mg  one by mouth daily
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
## 2013-06-19 ENCOUNTER — Ambulatory Visit (HOSPITAL_COMMUNITY): Payer: Medicare Other | Attending: Cardiology | Admitting: Radiology

## 2013-06-19 ENCOUNTER — Telehealth: Payer: Self-pay | Admitting: *Deleted

## 2013-06-19 ENCOUNTER — Encounter (INDEPENDENT_AMBULATORY_CARE_PROVIDER_SITE_OTHER): Payer: Medicare Other

## 2013-06-19 DIAGNOSIS — R55 Syncope and collapse: Secondary | ICD-10-CM

## 2013-06-19 DIAGNOSIS — I079 Rheumatic tricuspid valve disease, unspecified: Secondary | ICD-10-CM | POA: Insufficient documentation

## 2013-06-19 DIAGNOSIS — I059 Rheumatic mitral valve disease, unspecified: Secondary | ICD-10-CM | POA: Insufficient documentation

## 2013-06-19 NOTE — Telephone Encounter (Signed)
21 day event monitor placed on Pt 06/19/13 TK 

## 2013-06-19 NOTE — Progress Notes (Signed)
Echocardiogram performed.  

## 2013-07-28 ENCOUNTER — Ambulatory Visit (INDEPENDENT_AMBULATORY_CARE_PROVIDER_SITE_OTHER): Payer: Medicare Other | Admitting: Cardiovascular Disease

## 2013-07-28 ENCOUNTER — Encounter: Payer: Self-pay | Admitting: Cardiovascular Disease

## 2013-07-28 VITALS — BP 100/50 | HR 88 | Ht 69.0 in | Wt 171.0 lb

## 2013-07-28 DIAGNOSIS — R55 Syncope and collapse: Secondary | ICD-10-CM

## 2013-07-28 MED ORDER — LISINOPRIL 10 MG PO TABS: 10.0000 mg | ORAL_TABLET | Freq: Every day | ORAL | Status: DC

## 2013-07-28 NOTE — Patient Instructions (Signed)
Your physician has recommended you make the following change in your medication: DECREASE Lisinopril to 10mg  take one by mouth daily  Your physician recommends that you schedule a follow-up appointment as needed with Dr Kirke Corin.

## 2013-07-28 NOTE — Progress Notes (Signed)
Primary care physician: Dr. Evlyn Kanner  HPI  This is a pleasant 79 year old [name] who is here today for a follow up visit for possible atrial fibrillation.  He suffered from an ischemic stroke in 2012. There was mild nonobstructive carotid disease. He also has diet controlled diabetes and hypertension. He was placed on aspirin and Plavix but developed major GI hemorrhage requiring 4 units of blood transfusion. Endoscopy revealed duodenal ulcer.  He underwent right hip replacement in July of this year.  He had a recent episode of confusion and ? Presyncope. He was noted to have irregular rhythm thought be A-fib but likely was sinus rhythm with frequent PVCs.  I proceeded with outpatient telemetry which so far shows no evidence of atrial fibrillation. He did have PVCs and PACs. Echocardiogram showed normal LV systolic function with no significant valvular abnormalities. He was found recently to have a urinary tract infection which could have been the culprit for his recent confusion. He is overall feeling better except for mild dizziness.    No Known Allergies   Current Outpatient Prescriptions on File Prior to Visit  Medication Sig Dispense Refill  . aspirin 81 MG tablet Take 81 mg by mouth daily.      . ergocalciferol (VITAMIN D2) 50000 UNITS capsule Take 50,000 Units by mouth once a week. Patient takes on Mondays      . pantoprazole (PROTONIX) 40 MG tablet Take 40 mg by mouth daily.      . rosuvastatin (CRESTOR) 20 MG tablet Take 20 mg by mouth every morning.        No current facility-administered medications on file prior to visit.     Past Medical History  Diagnosis Date  . Hyperlipemia   . CVA (cerebral infarction)   . Hypertension   . Stroke     2011 no deficits   . GERD (gastroesophageal reflux disease)     hx of   . Arthritis   . Ulcer     stomach 2011 related to plavix   . Hx of transfusion of packed red blood cells   . Diabetes mellitus without complication     Diet  controlled     Past Surgical History  Procedure Laterality Date  . Total hip arthroplasty Right 04/28/2013    Procedure: RIGHT TOTAL HIP ARTHROPLASTY ANTERIOR APPROACH;  Surgeon: [name], MD;  Location: [facility];  Service: Orthopedics;  Laterality: Right;     No family history on file.   History   Social History  . Marital Status: Widowed    Spouse Name: N/A    Number of Children: N/A  . Years of Education: N/A   Occupational History  . Not on file.   Social History Main Topics  . Smoking status: Never Smoker   . Smokeless tobacco: Never Used  . Alcohol Use: 1.8 oz/week    3 Shots of liquor per week     Comment: wine or vodka   . Drug Use: No  . Sexual Activity: Not on file   Other Topics Concern  . Not on file   Social History Narrative  . No narrative on file     ROS A 10 point review of system was performed. It's negative other than what is mentioned in the history of present illness.  PHYSICAL EXAM   BP 100/50  Pulse 88  Ht 5\' Stuart"  (1.753 m)  Wt 171 lb (77.565 kg)  BMI 25.24 kg/m2 Constitutional: He is oriented to person, place, and  time. He appears well-developed and well-nourished. No distress.  HENT: No nasal discharge.  Head: Normocephalic and atraumatic.  Eyes: Pupils are equal and round. Right eye exhibits no discharge. Left eye exhibits no discharge.  Neck: Normal range of motion. Neck supple. No JVD present. No thyromegaly present.  Cardiovascular: Normal rate, regular rhythm with premature beats, normal heart sounds and. Exam reveals no gallop and no friction rub. No murmur heard.  Pulmonary/Chest: Effort normal and breath sounds normal. No stridor. No respiratory distress. He has no wheezes. He has no rales. He exhibits no tenderness.  Abdominal: Soft. Bowel sounds are normal. He exhibits no distension. There is no tenderness. There is no rebound and no guarding.  Musculoskeletal: Normal range of motion. He exhibits no edema and no  tenderness.  Neurological: He is alert and oriented to person, place, and time. Coordination normal.  Skin: Skin is warm and dry. No rash noted. He is not diaphoretic. No erythema. No pallor.  Psychiatric: He has a normal mood and affect. His behavior is normal. Judgment and thought content normal.        ASSESSMENT AND PLAN

## 2013-07-30 NOTE — Assessment & Plan Note (Signed)
This was likely due to urinary tract infection. Echocardiogram was unremarkable. Outpatient telemetry showed no evidence of atrial fibrillation but there were PACs and PVCs. His blood pressure is still running on the low side which might be contributing to his dizziness. Thus, I decreased the dose to 10 mg once daily. He is going to followup with his primary care physician. If blood pressure remains low, lisinopril can be stopped.

## 2013-09-23 ENCOUNTER — Inpatient Hospital Stay (HOSPITAL_COMMUNITY)
Admission: EM | Admit: 2013-09-23 | Discharge: 2013-10-05 | DRG: 377 | Disposition: A | Discharge status: Home Health | Payer: Medicare Other | Attending: Internal Medicine | Admitting: Internal Medicine

## 2013-09-23 ENCOUNTER — Encounter (HOSPITAL_COMMUNITY)
Admission: EM | Disposition: A | Discharge status: Home Health | Payer: Self-pay | Source: Home / Self Care | Attending: Pulmonary Disease

## 2013-09-23 ENCOUNTER — Emergency Department (HOSPITAL_COMMUNITY): Payer: Medicare Other

## 2013-09-23 ENCOUNTER — Encounter (HOSPITAL_COMMUNITY): Payer: Self-pay | Admitting: Emergency Medicine

## 2013-09-23 DIAGNOSIS — R112 Nausea with vomiting, unspecified: Secondary | ICD-10-CM | POA: Diagnosis not present

## 2013-09-23 DIAGNOSIS — K311 Adult hypertrophic pyloric stenosis: Secondary | ICD-10-CM | POA: Diagnosis not present

## 2013-09-23 DIAGNOSIS — K859 Acute pancreatitis without necrosis or infection, unspecified: Secondary | ICD-10-CM | POA: Diagnosis not present

## 2013-09-23 DIAGNOSIS — R578 Other shock: Secondary | ICD-10-CM

## 2013-09-23 DIAGNOSIS — Z23 Encounter for immunization: Secondary | ICD-10-CM

## 2013-09-23 DIAGNOSIS — D500 Iron deficiency anemia secondary to blood loss (chronic): Secondary | ICD-10-CM | POA: Diagnosis not present

## 2013-09-23 DIAGNOSIS — R0902 Hypoxemia: Secondary | ICD-10-CM | POA: Diagnosis not present

## 2013-09-23 DIAGNOSIS — K922 Gastrointestinal hemorrhage, unspecified: Secondary | ICD-10-CM

## 2013-09-23 DIAGNOSIS — E876 Hypokalemia: Secondary | ICD-10-CM | POA: Diagnosis not present

## 2013-09-23 DIAGNOSIS — E872 Acidosis, unspecified: Secondary | ICD-10-CM | POA: Diagnosis present

## 2013-09-23 DIAGNOSIS — E785 Hyperlipidemia, unspecified: Secondary | ICD-10-CM | POA: Diagnosis present

## 2013-09-23 DIAGNOSIS — I4891 Unspecified atrial fibrillation: Secondary | ICD-10-CM

## 2013-09-23 DIAGNOSIS — E663 Overweight: Secondary | ICD-10-CM | POA: Diagnosis present

## 2013-09-23 DIAGNOSIS — N179 Acute kidney failure, unspecified: Secondary | ICD-10-CM | POA: Diagnosis not present

## 2013-09-23 DIAGNOSIS — K260 Acute duodenal ulcer with hemorrhage: Secondary | ICD-10-CM

## 2013-09-23 DIAGNOSIS — R4181 Age-related cognitive decline: Secondary | ICD-10-CM | POA: Diagnosis present

## 2013-09-23 DIAGNOSIS — K559 Vascular disorder of intestine, unspecified: Secondary | ICD-10-CM

## 2013-09-23 DIAGNOSIS — K264 Chronic or unspecified duodenal ulcer with hemorrhage: Secondary | ICD-10-CM | POA: Diagnosis not present

## 2013-09-23 DIAGNOSIS — K7689 Other specified diseases of liver: Secondary | ICD-10-CM | POA: Diagnosis present

## 2013-09-23 DIAGNOSIS — I10 Essential (primary) hypertension: Secondary | ICD-10-CM

## 2013-09-23 DIAGNOSIS — I959 Hypotension, unspecified: Secondary | ICD-10-CM | POA: Diagnosis present

## 2013-09-23 DIAGNOSIS — E119 Type 2 diabetes mellitus without complications: Secondary | ICD-10-CM

## 2013-09-23 DIAGNOSIS — K209 Esophagitis, unspecified without bleeding: Secondary | ICD-10-CM | POA: Diagnosis not present

## 2013-09-23 DIAGNOSIS — Z7982 Long term (current) use of aspirin: Secondary | ICD-10-CM | POA: Diagnosis not present

## 2013-09-23 DIAGNOSIS — Z79899 Other long term (current) drug therapy: Secondary | ICD-10-CM | POA: Diagnosis not present

## 2013-09-23 DIAGNOSIS — R748 Abnormal levels of other serum enzymes: Secondary | ICD-10-CM

## 2013-09-23 DIAGNOSIS — D62 Acute posthemorrhagic anemia: Secondary | ICD-10-CM | POA: Diagnosis not present

## 2013-09-23 DIAGNOSIS — R05 Cough: Secondary | ICD-10-CM | POA: Diagnosis not present

## 2013-09-23 DIAGNOSIS — K760 Fatty (change of) liver, not elsewhere classified: Secondary | ICD-10-CM

## 2013-09-23 DIAGNOSIS — Z8673 Personal history of transient ischemic attack (TIA), and cerebral infarction without residual deficits: Secondary | ICD-10-CM | POA: Diagnosis not present

## 2013-09-23 DIAGNOSIS — R55 Syncope and collapse: Secondary | ICD-10-CM

## 2013-09-23 DIAGNOSIS — K219 Gastro-esophageal reflux disease without esophagitis: Secondary | ICD-10-CM | POA: Diagnosis present

## 2013-09-23 DIAGNOSIS — Z96649 Presence of unspecified artificial hip joint: Secondary | ICD-10-CM

## 2013-09-23 HISTORY — PX: ESOPHAGOGASTRODUODENOSCOPY: SHX5428

## 2013-09-23 LAB — COMPREHENSIVE METABOLIC PANEL
ALT: 17 U/L (ref 0–53)
AST: 25 U/L (ref 0–37)
Albumin: 3.9 g/dL (ref 3.5–5.2)
Alkaline Phosphatase: 39 U/L (ref 39–117)
BUN: 31 mg/dL — ABNORMAL HIGH (ref 6–23)
CO2: 23 mEq/L (ref 19–32)
Calcium: 10.1 mg/dL (ref 8.4–10.5)
Chloride: 102 mEq/L (ref 96–112)
Creatinine, Ser: 1.06 mg/dL (ref 0.50–1.35)
GFR calc Af Amer: 71 mL/min — ABNORMAL LOW (ref >90)
GFR calc non Af Amer: 61 mL/min — ABNORMAL LOW (ref >90)
Glucose, Bld: 151 mg/dL — ABNORMAL HIGH (ref 70–99)
Potassium: 3.8 mEq/L (ref 3.5–5.1)
Sodium: 138 mEq/L (ref 135–145)
Total Bilirubin: 0.7 mg/dL (ref 0.3–1.2)
Total Protein: 7.2 g/dL (ref 6.0–8.3)

## 2013-09-23 LAB — CBC
HCT: 39.6 % (ref 39.0–52.0)
Hemoglobin: 13.8 g/dL (ref 13.0–17.0)
MCH: 32.2 pg (ref 26.0–34.0)
MCHC: 34.8 g/dL (ref 30.0–36.0)
MCV: 92.5 fL (ref 78.0–100.0)
Platelets: 383 10*3/uL (ref 150–400)
RBC: 4.28 MIL/uL (ref 4.22–5.81)
RDW: 14.8 % (ref 11.5–15.5)
WBC: 21.6 10*3/uL — ABNORMAL HIGH (ref 4.0–10.5)

## 2013-09-23 LAB — URINALYSIS, ROUTINE W REFLEX MICROSCOPIC
Bilirubin Urine: NEGATIVE
Glucose, UA: NEGATIVE mg/dL
Hgb urine dipstick: NEGATIVE
Ketones, ur: NEGATIVE mg/dL
Leukocytes, UA: NEGATIVE
Nitrite: NEGATIVE
Protein, ur: NEGATIVE mg/dL
Specific Gravity, Urine: 1.019 (ref 1.005–1.030)
Urobilinogen, UA: 0.2 mg/dL (ref 0.0–1.0)
pH: 6.5 (ref 5.0–8.0)

## 2013-09-23 LAB — CBC WITH DIFFERENTIAL/PLATELET
Basophils Absolute: 0 10*3/uL (ref 0.0–0.1)
Basophils Relative: 0 % (ref 0–1)
Eosinophils Absolute: 0 10*3/uL (ref 0.0–0.7)
Eosinophils Relative: 0 % (ref 0–5)
HCT: 42.6 % (ref 39.0–52.0)
Hemoglobin: 14.5 g/dL (ref 13.0–17.0)
Lymphocytes Relative: 12 % (ref 12–46)
Lymphs Abs: 1.7 10*3/uL (ref 0.7–4.0)
MCH: 31.5 pg (ref 26.0–34.0)
MCHC: 34 g/dL (ref 30.0–36.0)
MCV: 92.4 fL (ref 78.0–100.0)
Monocytes Absolute: 1.4 10*3/uL — ABNORMAL HIGH (ref 0.1–1.0)
Monocytes Relative: 10 % (ref 3–12)
Neutro Abs: 11.1 10*3/uL — ABNORMAL HIGH (ref 1.7–7.7)
Neutrophils Relative %: 78 % — ABNORMAL HIGH (ref 43–77)
Platelets: 374 10*3/uL (ref 150–400)
RBC: 4.61 MIL/uL (ref 4.22–5.81)
RDW: 14.8 % (ref 11.5–15.5)
WBC: 14.3 10*3/uL — ABNORMAL HIGH (ref 4.0–10.5)

## 2013-09-23 LAB — CG4 I-STAT (LACTIC ACID): Lactic Acid, Venous: 3.98 mmol/L — ABNORMAL HIGH (ref 0.5–2.2)

## 2013-09-23 LAB — PROTIME-INR
INR: 1 (ref 0.00–1.49)
Prothrombin Time: 13 seconds (ref 11.6–15.2)

## 2013-09-23 SURGERY — EGD (ESOPHAGOGASTRODUODENOSCOPY)
Anesthesia: Moderate Sedation

## 2013-09-23 MED ORDER — PROPOFOL 10 MG/ML IV BOLUS: 150.0000 mg | Freq: Once | INTRAVENOUS | Status: DC

## 2013-09-23 MED ORDER — MIDAZOLAM HCL 10 MG/2ML IJ SOLN
INTRAMUSCULAR | Status: DC | PRN
  Administered 2013-09-23: 1 mg via INTRAVENOUS

## 2013-09-23 MED ORDER — FENTANYL CITRATE 0.05 MG/ML IJ SOLN
INTRAMUSCULAR | Status: AC
  Filled 2013-09-23: qty 2

## 2013-09-23 MED ORDER — PANTOPRAZOLE SODIUM 40 MG IV SOLR: 80.0000 mg | Freq: Once | INTRAVENOUS | Status: DC

## 2013-09-23 MED ORDER — GI COCKTAIL ~~LOC~~
30.0000 mL | Freq: Once | ORAL | Status: AC
  Administered 2013-09-23: 30 mL via ORAL
  Filled 2013-09-23: qty 30

## 2013-09-23 MED ORDER — SODIUM CHLORIDE 0.9 % IV SOLN: INTRAVENOUS | Status: DC

## 2013-09-23 MED ORDER — SODIUM CHLORIDE 0.9 % IV SOLN
80.0000 mg | Freq: Once | INTRAVENOUS | Status: AC
  Administered 2013-09-23: 20:00:00 80 mg via INTRAVENOUS
  Filled 2013-09-23: qty 80

## 2013-09-23 MED ORDER — FENTANYL CITRATE 0.05 MG/ML IJ SOLN
INTRAMUSCULAR | Status: DC | PRN
Start: 2013-09-23 — End: 2013-09-23
  Administered 2013-09-23: 12.5 ug via INTRAVENOUS

## 2013-09-23 MED ORDER — IOHEXOL 300 MG/ML  SOLN
25.0000 mL | Freq: Once | INTRAMUSCULAR | Status: AC | PRN
  Administered 2013-09-23: 25 mL via ORAL

## 2013-09-23 MED ORDER — CIPROFLOXACIN IN D5W 400 MG/200ML IV SOLN
400.0000 mg | Freq: Once | INTRAVENOUS | Status: AC
  Administered 2013-09-23: 400 mg via INTRAVENOUS
  Filled 2013-09-23: qty 200

## 2013-09-23 MED ORDER — FENTANYL CITRATE 0.05 MG/ML IJ SOLN
50.0000 ug | Freq: Once | INTRAMUSCULAR | Status: AC
  Administered 2013-09-23: 50 ug via INTRAVENOUS
  Filled 2013-09-23: qty 2

## 2013-09-23 MED ORDER — ASPIRIN 81 MG PO CHEW
162.0000 mg | CHEWABLE_TABLET | Freq: Once | ORAL | Status: AC
  Administered 2013-09-23: 162 mg via ORAL
  Filled 2013-09-23: qty 2

## 2013-09-23 MED ORDER — MORPHINE SULFATE 4 MG/ML IJ SOLN
4.0000 mg | Freq: Once | INTRAMUSCULAR | Status: AC
  Administered 2013-09-23: 4 mg via INTRAVENOUS
  Filled 2013-09-23: qty 1

## 2013-09-23 MED ORDER — METRONIDAZOLE IN NACL 5-0.79 MG/ML-% IV SOLN
500.0000 mg | Freq: Once | INTRAVENOUS | Status: AC
  Administered 2013-09-23: 500 mg via INTRAVENOUS
  Filled 2013-09-23: qty 100

## 2013-09-23 MED ORDER — IOHEXOL 300 MG/ML  SOLN
100.0000 mL | Freq: Once | INTRAMUSCULAR | Status: AC | PRN
  Administered 2013-09-23: 100 mL via INTRAVENOUS

## 2013-09-23 MED ORDER — SODIUM CHLORIDE 0.9 % IV SOLN
Freq: Once | INTRAVENOUS | Status: AC
  Administered 2013-09-23: 13:00:00 via INTRAVENOUS

## 2013-09-23 MED ORDER — SODIUM CHLORIDE 0.9 % IV SOLN
8.0000 mg/h | INTRAVENOUS | Status: AC
  Administered 2013-09-23 – 2013-09-26 (×6): 8 mg/h via INTRAVENOUS
  Filled 2013-09-23 (×17): qty 80

## 2013-09-23 MED ORDER — FENTANYL CITRATE 0.05 MG/ML IJ SOLN: 100.0000 ug | Freq: Once | INTRAMUSCULAR | Status: DC

## 2013-09-23 MED ORDER — MIDAZOLAM HCL 5 MG/ML IJ SOLN
INTRAMUSCULAR | Status: AC
  Filled 2013-09-23: qty 1

## 2013-09-23 NOTE — ED Notes (Signed)
Pt reports having vomiting since Sunday and now mild mid chest tightness. ekg done at triage. Airway intact.

## 2013-09-23 NOTE — ED Notes (Addendum)
Endoscopy RN in room for procedural sedation

## 2013-09-23 NOTE — ED Notes (Signed)
Pt completed CT contrast; CT called 

## 2013-09-23 NOTE — Op Note (Signed)
[name] [facility] [address] [place], [address]   ENDOSCOPY PROCEDURE REPORT  PATIENT: [name]  MR#: #[id number] BIRTHDATE: 09/24/1921 , 89  yrs. old GENDER: Male ENDOSCOPIST: Hart Carwin, MD REFERRED BY:  ED MD PROCEDURE DATE:  09/23/2013 PROCEDURE:  enteroscopy with biopsy ASA CLASS:     Class III INDICATIONS:  Hematemesis. MEDICATIONS: These medications were titrated to patient response per physician's verbal order, Fentanyl 12.5 mcg IV, and Versed 1mg  IV  TOPICAL ANESTHETIC: none  DESCRIPTION OF PROCEDURE: After the risks benefits and alternatives of the procedure were thoroughly explained, informed consent was obtained.  The Pentax Gastroscope [id number] endoscope was introduced through the mouth and advanced to the proximal jejunum. Without limitations.  The instrument was slowly withdrawn as the mucosa was fully examined.      Esophagus: proximal mid and distal esophageal mucosa was severely ulcerated and covered with heavy exudate. There was no active bleeding and there were no esophageal varices ,,biopsies were obtained from esophageal mucosa to rule out viral esophagitis without difficulty Stomach: there was 2000cc off coffee  ground material accumulated in the stomach . All of it but several hundrered cc's were aspirated Patient vomited a large amount of coffee ground material which was suctioned from his mouth After decompressing the stomach the endoscope traversed into the duodenum. Gastric mucosa itself was erythematous but there was no bleeding abdomen no gastric varices or ulcerations. Pyloric outlet was normal  ,retroflexion of the endoscope couldn't confirm presence of retained blood duodenum: There were   extensive blood clots in the duodenal bulb,these  were adherent to the duodenal mucosa. Distal  to the duodenal bulb were extensive ulcerations which serpiginous as well as  superficial ,covered with coffee-ground  material , and with white exudate extending to the ligament of Treitz. There was also bright red blood surrounding the duodenal but  no active bleeding was observed .Most of the blood was aspirated duodenum irrigated with copious amount of water,but no active bleeding was observed to the level distal from  the ligament of Treitzat 130 cm .abscesses were obtained from some of the ulcerations appeared ischemic    The scope was then withdrawn from the patient and the procedure completed.  COMPLICATIONS: There were no complications. ENDOSCOPIC IMPRESSION: Severe esophagitis stenting throughout the entire length of the esophagus, no active bleeding Large amount of the retained blood in the stomach total of 2000 cc Extensive duodenal ulcerations extending from duodenal bulb and descending duodenum to ligament of Treitz, consistent with ischemic duodenitis, status post biopsies Presence of fresh blood along descending duodenum mostly in duodenal bulb which was obscured with large amount of clots but no active bleeding was observed and therefore no definite site of bleeding was idenfified  RECOMMENDATIONS: place nasogastric tube to suction Bowel rest Protonix infusion Critical care consult, Patient is at high risk for aspiration Recommend IR consult for consideration of mesenteric arteriogram versus CT angiogram to assess mesenteric vessels observe closely for further bleeding, Hold aspirin Repeat   endoscopy in next 24- 48 hours to obtainbetter visualization of the duodenal bulb and descending duodenum which was partially obscured by blood   REPEAT EXAM: in 24-48 hours  eSigned:  Hart Carwin, MD 09/23/2013 10:16 PM   CC:  PATIENT NAME:  [name], [name] MR#: #[id number]

## 2013-09-23 NOTE — H&P (Signed)
Name: [name] MRN: [id number] DOB: 1924-05-21    ADMISSION DATE:  09/23/2013 CONSULTATION DATE:  09/23/2013  REFERRING MD :  ED, Juanda Chance GI PRIMARY SERVICE: PCCM  CHIEF COMPLAINT:  UGI bleeding  BRIEF PATIENT DESCRIPTION: 79 y.o with UGI bleeding, EGD showing multiple duodenal ulcers, Asked by GI to admit to ICU due to high risk of rebleed  SIGNIFICANT EVENTS / STUDIES:  CT abdomen 12/24 >>Active contrast extravasation/ bleeding within the distal duodenum Associated gastric outlet obstruction with marked distention of the stomach and proximal duodenum.mild hepatic cirrhosis [place] abd >> Liver echogenicity  Increased ? Fatty liver   LINES / TUBES:   CULTURES:   ANTIBIOTICS:   HISTORY OF PRESENT ILLNESS:   Presented with cc of epigastric abd pain radiating to sternum, and nausea, vomiting x 2 x 1 day, developed coffee grounds in ED CT abdomen -s/o duodenal bleeding [place] abd unremarkable  He had an ischemic stroke in 2012.  He  has diet controlled diabetes and hypertension. He was placed on aspirin and Plavix but developed major GI hemorrhage in 2012 requiring 4 units of blood transfusion. Endoscopy revealed duodenal ulcer. He underwent right hip replacement in July 2014  He had a recent episode of confusion and ? Presyncope. He was noted to have irregular rhythm -Holter showed no evidence of atrial fibrillation. He did have PVCs and PACs. Echocardiogram showed normal LV systolic function ,felt to be due to UTI- recomm stop lisinopril if BP low.    PAST MEDICAL HISTORY :  Past Medical History  Diagnosis Date  . Hyperlipemia   . CVA (cerebral infarction)   . Hypertension   . Stroke     2011 no deficits   . GERD (gastroesophageal reflux disease)     hx of   . Arthritis   . Ulcer     stomach 2011 related to plavix   . Hx of transfusion of packed red blood cells   . Diabetes mellitus without complication     Diet controlled   Past Surgical History  Procedure  Laterality Date  . Total hip arthroplasty Right 04/28/2013    Procedure: RIGHT TOTAL HIP ARTHROPLASTY ANTERIOR APPROACH;  Surgeon: [name], MD;  Location: [facility];  Service: Orthopedics;  Laterality: Right;   Prior to Admission medications   Medication Sig Start Date End Date Taking? Authorizing Provider  aspirin EC 81 MG tablet Take 81 mg by mouth daily.   Yes Historical Provider, MD  lisinopril (PRINIVIL,ZESTRIL) 10 MG tablet Take 1 tablet (10 mg total) by mouth daily. 07/28/13  Yes [place] Ouch, MD  pantoprazole (PROTONIX) 40 MG tablet Take 40 mg by mouth daily.   Yes Historical Provider, MD  rosuvastatin (CRESTOR) 20 MG tablet Take 20 mg by mouth every morning.    Yes Historical Provider, MD  ergocalciferol (VITAMIN D2) 50000 UNITS capsule Take 50,000 Units by mouth once a week. Patient takes on Mondays    Historical Provider, MD   No Known Allergies  FAMILY HISTORY:  History reviewed. No pertinent family history. SOCIAL HISTORY:  reports that he has never smoked. He has never used smokeless tobacco. He reports that he drinks about 1.8 ounces of alcohol per week. He reports that he does not use illicit drugs.  REVIEW OF SYSTEMS:  Unable to obtain since sedated post procedure  SUBJECTIVE:   VITAL SIGNS: Temp:  [97.5 F (36.4 C)-98.5 F (36.9 C)] 98.5 F (36.9 C) (12/24 2155) Pulse Rate:  [36-123] 114 (12/24  2155) Resp:  [14-33] 21 (12/24 2155) BP: (104-198)/(48-107) 115/73 mmHg (12/24 2130) SpO2:  [91 %-98 %] 98 % (12/24 2155) HEMODYNAMICS:   VENTILATOR SETTINGS:   INTAKE / OUTPUT: Intake/Output   None     PHYSICAL EXAMINATION: Gen. Pleasant, well-nourished, in no distress, normal affect ENT - no lesions, no post nasal drip Neck: No JVD, no thyromegaly, no carotid bruits Lungs: no use of accessory muscles, no dullness to percussion, clear without rales or rhonchi  Cardiovascular: Rhythm regular, heart sounds  normal, no murmurs, no peripheral edema Abdomen:  soft and non-tender,distneded, no hepatosplenomegaly, BS normal. Musculoskeletal: No deformities, no cyanosis or clubbing Neuro: sedated post procedure, non focal Skin:  Warm, no lesions/ rash   LABS:  CBC  Recent Labs Lab 09/23/13 1154 09/23/13 1951  WBC 14.3* 21.6*  HGB 14.5 13.8  HCT 42.6 39.6  PLT 374 383   Coag's  Recent Labs Lab 09/23/13 1154  INR 1.00   BMET  Recent Labs Lab 09/23/13 1154  NA 138  K 3.8  CL 102  CO2 23  BUN 31*  CREATININE 1.06  GLUCOSE 151*   Electrolytes  Recent Labs Lab 09/23/13 1154  CALCIUM 10.1   Sepsis Markers No results found for this basename: LATICACIDVEN, PROCALCITON, O2SATVEN,  in the last 168 hours ABG No results found for this basename: PHART, PCO2ART, PO2ART,  in the last 168 hours Liver Enzymes  Recent Labs Lab 09/23/13 1154  AST 25  ALT 17  ALKPHOS 39  BILITOT 0.7  ALBUMIN 3.9   Cardiac Enzymes No results found for this basename: TROPONINI, PROBNP,  in the last 168 hours Glucose No results found for this basename: GLUCAP,  in the last 168 hours  Imaging Dg Chest 1 View  09/23/2013   CLINICAL DATA:  Chest pain  EXAM: CHEST - 1 VIEW  COMPARISON:  04/22/2013  FINDINGS: The heart size and mediastinal contours are within normal limits. Both lungs are clear. The visualized skeletal structures are unremarkable.  IMPRESSION: No active disease.   Electronically Signed   By: Alcide Clever M.D.   On: 09/23/2013 12:22   Ct Abdomen Pelvis W Contrast  09/23/2013   CLINICAL DATA:  Vomiting, epigastric pain  EXAM: CT ABDOMEN AND PELVIS WITH CONTRAST  TECHNIQUE: Multidetector CT imaging of the abdomen and pelvis was performed using the standard protocol following bolus administration of intravenous contrast.  CONTRAST:  OMNIPAQUE IOHEXOL 300 MG/ML  SOLN  COMPARISON:  09/23/2013 abdominal ultrasound  FINDINGS: Minor dependent basilar atelectasis bilaterally. Normal heart size. No pericardial effusion. Trace left  pleural effusion. Distal esophagus is patulous and fluid-filled, suspect esophageal dysmotility. Coronary calcifications noted. Degenerative changes of the lower thoracic spine with large osteophytes.  Abdomen: Mild nodularity to the liver surface compatible with cirrhosis. No biliary dilatation. Gallbladder, biliary system, pancreas, spleen, and adrenal glands are within normal limits for age and demonstrate no acute finding. Incidental splenic calcification compatible granulomatous disease, image 29.  The stomach and proximal duodenum are distended with hyperdense heterogeneous fluid. Third portion of the duodenum demonstrates intraluminal dense contrast. This is compatible with contrast extravasation/active duodenal bleeding. This portion of the duodenum demonstrates circumferential wall thickening. Appearance is compatible with bleeding from a duodenal ulcer with duodenitis versus a duodenum mass.  Kidneys demonstrate hypodense renal cysts bilaterally. Largest cyst in the left kidney midpole measures 13 mm, image 38. No renal obstruction or hydronephrosis. No obstructing urinary tract calculi.  Atherosclerotic changes of the aorta diffusely. Negative for aneurysm or  dissection.  No free air, fluid collection, or abscess. Diffuse colonic diverticulosis present.  Pelvis: Extensive sigmoid region diverticulosis. No pelvic free fluid, fluid collection, hemorrhage, abscess, or adenopathy. No inguinal abnormality or hernia. Urinary bladder unremarkable. Previous right hip arthroplasty noted. Diffuse degenerative changes of the spine and pelvis.  IMPRESSION: Active contrast extravasation/ bleeding within the distal duodenum in an area of duodenal wall thickening compatible with duodenal ulcer disease with duodenitis versus a duodenal mass. Associated gastric outlet obstruction with marked distention of the stomach and proximal duodenum.  Bibasilar atelectasis and trace left pleural fluid  Probable mild hepatic  cirrhosis  Aortic atherosclerosis without occlusion  Incidental renal cysts  Colonic diverticulosis  These results were called by telephone at the time of interpretation on 09/23/2013 at 6:35 PM to Dr. Derwood Kaplan , who verbally acknowledged these results.   Electronically Signed   By: Ruel Favors M.D.   On: 09/23/2013 18:49   US Abdomen Limited  09/23/2013   CLINICAL DATA:  Vomiting  EXAM: US ABDOMEN LIMITED - RIGHT UPPER QUADRANT  COMPARISON:  None.  FINDINGS: Gallbladder  No gallstones or wall thickening visualized. There is no pericholecystic fluid. No sonographic Murphy sign noted.  Common bile duct  Diameter: 3 mm. There is no intrahepatic or extrahepatic biliary duct dilatation.  Liver:  No focal lesion identified. Liver echogenicity is somewhat increased and inhomogeneous.  IMPRESSION: Liver echogenicity is somewhat increased and inhomogeneous. This appearance is consistent with fatty change and/or underlying parenchymal disease. While no focal liver lesions are identified, it must be cautioned that the sensitivity of ultrasound for focal liver lesions is diminished in this circumstance. Study is otherwise unremarkable.   Electronically Signed   By: Bretta Bang M.D.   On: 09/23/2013 14:48     CXR: clear  ASSESSMENT / PLAN:  PULMONARY A:Mild hypoxia post sedation for EGD P:   O2 [place]  CARDIOVASCULAR A: Nml LV fn, recent syncope evaluated by Lb cards htn P:  Telemetry Hold lisinopril Dc ASA  RENAL A:  High risk AKi -contrast, hypotension P:   Monitor, hydrate  GASTROINTESTINAL A:  UGI bleed- multiple duodenal ulcers extending to jejunum ? ischemic H/o DU bleed in 2012  Concern for mesenteric  ischemia P:   Serial Hb, NG to LIS inserted by me -once clear then to gravity Protonix gtt Chk lactate, If worse consider IR input for angiogram, d/w GI  HEMATOLOGIC A:  Acute blood loss anemia P:  Transfuse for Hb <10 or if hemodynamically unstable  INFECTIOUS A:   Concern for ischemic bowel P:   CIpro/ flagyl  ENDOCRINE A:  Diet controlled DM-2   P:   CBgs while npo  NEUROLOGIC A:  No issues P:     TODAY'S SUMMARY: UGI bleed with multiple duodenal ulcers,  Concern for mesenteric  Ischemia - plan to rescope in 24 h, mesenteric angio if worse  I have personally obtained a history, examined the patient, evaluated laboratory and imaging results, formulated the assessment and plan and placed orders. CRITICAL CARE: The patient is critically ill with multiple organ systems failure and requires high complexity decision making for assessment and support, frequent evaluation and titration of therapies, application of advanced monitoring technologies and extensive interpretation of multiple databases. Critical Care Time devoted to patient care services described in this note is 50 minutes.   Oretha Milch  Pulmonary and Critical Care Medicine [facility] Pager: [phone]  09/23/2013, 10:18 PM

## 2013-09-23 NOTE — Interval H&P Note (Signed)
History and Physical Interval Note:  09/23/2013 9:45 PM  [name]  has presented today for surgery, with the diagnosis of UGI bleed  The various methods of treatment have been discussed with the patient and family. After consideration of risks, benefits and other options for treatment, the patient has consented to  Procedure(s): ESOPHAGOGASTRODUODENOSCOPY (EGD) (N/A) as a surgical intervention .  The patient's history has been reviewed, patient examined, no change in status, stable for surgery.  I have reviewed the patient's chart and labs.  Questions were answered to the patient's satisfaction.     Lina Sar

## 2013-09-23 NOTE — ED Notes (Signed)
MD at bedside. 

## 2013-09-23 NOTE — ED Notes (Signed)
Report received from procedure MD

## 2013-09-23 NOTE — ED Notes (Signed)
MD at bedside. Vassie Loll, MD intensivist

## 2013-09-23 NOTE — H&P (View-Only) (Signed)
I have been asked to  See this pt for possible GIB, I have reviewed his CT scan which suggests GIB from  Descending duodenum. I have reviewed the last EGD from 11/2010 which showed bleeding DU. Pt is currently on ASA 81 mg daily , INR 1.0, Hgb 14.5. I have asked Endoscopy  Unit to prepare for EGD in ED.

## 2013-09-23 NOTE — ED Notes (Signed)
Patient transported to Ultrasound 

## 2013-09-23 NOTE — ED Notes (Signed)
Nanavati MD at bedside to place NG unsuccessful x2

## 2013-09-23 NOTE — Progress Notes (Signed)
I have been asked to  See this pt for possible GIB, I have reviewed his CT scan which suggests GIB from  Descending duodenum. I have reviewed the last EGD from 11/2010 which showed bleeding DU. Pt is currently on ASA 81 mg daily , INR 1.0, Hgb 14.5. I have asked Endoscopy  Unit to prepare for EGD in ED. 

## 2013-09-23 NOTE — ED Provider Notes (Addendum)
CSN: [id number]     Arrival date & time 09/23/13  1115 History   First MD Initiated Contact with Patient 09/23/13 1135     Chief Complaint  Patient presents with  . Vomiting  . Chest Pain   (Consider location/radiation/quality/duration/timing/severity/associated sxs/prior Treatment) HPI Comments: Pt comes in with cc of epigastric abd pain, and nausea. Pt has hx of gastric ulcer, and DM. States that he started having some abd discomfort 2-3 days ago, and nausea and emesis for the past 2 days. Emesis x 2 since dinner last night. Non bilious and non bloody. Pt has no appetite, and hasnt eaten today. Pt denies any bloody stool. Pain is in the epigastrim and radiates uptowards the sternal notch. Pt has no radiation to the back. No numbness, tingling.   Patient is a 79 y.o. male presenting with chest pain. The history is provided by the patient.  Chest Pain Associated symptoms: abdominal pain, fatigue, nausea and vomiting   Associated symptoms: no cough, no dizziness, no fever, no headache and no shortness of breath     Past Medical History  Diagnosis Date  . Hyperlipemia   . CVA (cerebral infarction)   . Hypertension   . Stroke     2011 no deficits   . GERD (gastroesophageal reflux disease)     hx of   . Arthritis   . Ulcer     stomach 2011 related to plavix   . Hx of transfusion of packed red blood cells   . Diabetes mellitus without complication     Diet controlled   Past Surgical History  Procedure Laterality Date  . Total hip arthroplasty Right 04/28/2013    Procedure: RIGHT TOTAL HIP ARTHROPLASTY ANTERIOR APPROACH;  Surgeon: [name], MD;  Location: [facility];  Service: Orthopedics;  Laterality: Right;   History reviewed. No pertinent family history. History  Substance Use Topics  . Smoking status: Never Smoker   . Smokeless tobacco: Never Used  . Alcohol Use: 1.8 oz/week    3 Shots of liquor per week     Comment: wine or vodka     Review of Systems   Constitutional: Positive for activity change, appetite change and fatigue. Negative for fever and chills.  Eyes: Negative for visual disturbance.  Respiratory: Negative for cough, chest tightness and shortness of breath.   Cardiovascular: Positive for chest pain.  Gastrointestinal: Positive for nausea, vomiting and abdominal pain. Negative for diarrhea, blood in stool and abdominal distention.  Genitourinary: Negative for dysuria, enuresis and difficulty urinating.  Musculoskeletal: Negative for arthralgias and neck pain.  Skin: Negative for rash.  Allergic/Immunologic: Negative for immunocompromised state.  Neurological: Negative for dizziness, light-headedness and headaches.  Hematological: Does not bruise/bleed easily.  Psychiatric/Behavioral: Negative for confusion.    Allergies  Review of patient's allergies indicates no known allergies.  Home Medications   Current Outpatient Rx  Name  Route  Sig  Dispense  Refill  . aspirin EC 81 MG tablet   Oral   Take 81 mg by mouth daily.         Marland Kitchen lisinopril (PRINIVIL,ZESTRIL) 10 MG tablet   Oral   Take 1 tablet (10 mg total) by mouth daily.   30 tablet   11   . pantoprazole (PROTONIX) 40 MG tablet   Oral   Take 40 mg by mouth daily.         . rosuvastatin (CRESTOR) 20 MG tablet   Oral   Take 20 mg by mouth every  morning.          . ergocalciferol (VITAMIN D2) 50000 UNITS capsule   Oral   Take 50,000 Units by mouth once a week. Patient takes on Mondays          BP 192/96  Pulse 119  Temp(Src) 97.9 F (36.6 C) (Oral)  Resp 25  SpO2 96% Physical Exam  Nursing note and vitals reviewed. Constitutional: He is oriented to person, place, and time. He appears well-developed.  HENT:  Head: Normocephalic and atraumatic.  Eyes: Conjunctivae and EOM are normal. Pupils are equal, round, and reactive to light.  Neck: Normal range of motion. Neck supple.  Cardiovascular: Normal rate and regular rhythm.   Pulmonary/Chest:  Effort normal and breath sounds normal.  Abdominal: Soft. Bowel sounds are normal. He exhibits no distension. There is no tenderness. There is no rebound and no guarding.  Epigastric tenderness  Neurological: He is alert and oriented to person, place, and time.  Skin: Skin is warm.    ED Course  Procedures (including critical care time) Labs Review Labs Reviewed  CBC WITH DIFFERENTIAL - Abnormal; Notable for the following:    WBC 14.3 (*)    Neutrophils Relative % 78 (*)    Neutro Abs 11.1 (*)    Monocytes Absolute 1.4 (*)    All other components within normal limits  COMPREHENSIVE METABOLIC PANEL - Abnormal; Notable for the following:    Glucose, Bld 151 (*)    BUN 31 (*)    GFR calc non Af Amer 61 (*)    GFR calc Af Amer 71 (*)    All other components within normal limits  CBC - Abnormal; Notable for the following:    WBC 21.6 (*)    All other components within normal limits  PROTIME-INR  URINALYSIS, ROUTINE W REFLEX MICROSCOPIC  TYPE AND SCREEN   Imaging Review Dg Chest 1 View  09/23/2013   CLINICAL DATA:  Chest pain  EXAM: CHEST - 1 VIEW  COMPARISON:  04/22/2013  FINDINGS: The heart size and mediastinal contours are within normal limits. Both lungs are clear. The visualized skeletal structures are unremarkable.  IMPRESSION: No active disease.   Electronically Signed   By: Alcide Clever M.D.   On: 09/23/2013 12:22   Ct Abdomen Pelvis W Contrast  09/23/2013   CLINICAL DATA:  Vomiting, epigastric pain  EXAM: CT ABDOMEN AND PELVIS WITH CONTRAST  TECHNIQUE: Multidetector CT imaging of the abdomen and pelvis was performed using the standard protocol following bolus administration of intravenous contrast.  CONTRAST:  OMNIPAQUE IOHEXOL 300 MG/ML  SOLN  COMPARISON:  09/23/2013 abdominal ultrasound  FINDINGS: Minor dependent basilar atelectasis bilaterally. Normal heart size. No pericardial effusion. Trace left pleural effusion. Distal esophagus is patulous and fluid-filled,  suspect esophageal dysmotility. Coronary calcifications noted. Degenerative changes of the lower thoracic spine with large osteophytes.  Abdomen: Mild nodularity to the liver surface compatible with cirrhosis. No biliary dilatation. Gallbladder, biliary system, pancreas, spleen, and adrenal glands are within normal limits for age and demonstrate no acute finding. Incidental splenic calcification compatible granulomatous disease, image 29.  The stomach and proximal duodenum are distended with hyperdense heterogeneous fluid. Third portion of the duodenum demonstrates intraluminal dense contrast. This is compatible with contrast extravasation/active duodenal bleeding. This portion of the duodenum demonstrates circumferential wall thickening. Appearance is compatible with bleeding from a duodenal ulcer with duodenitis versus a duodenum mass.  Kidneys demonstrate hypodense renal cysts bilaterally. Largest cyst in the left kidney midpole measures  13 mm, image 38. No renal obstruction or hydronephrosis. No obstructing urinary tract calculi.  Atherosclerotic changes of the aorta diffusely. Negative for aneurysm or dissection.  No free air, fluid collection, or abscess. Diffuse colonic diverticulosis present.  Pelvis: Extensive sigmoid region diverticulosis. No pelvic free fluid, fluid collection, hemorrhage, abscess, or adenopathy. No inguinal abnormality or hernia. Urinary bladder unremarkable. Previous right hip arthroplasty noted. Diffuse degenerative changes of the spine and pelvis.  IMPRESSION: Active contrast extravasation/ bleeding within the distal duodenum in an area of duodenal wall thickening compatible with duodenal ulcer disease with duodenitis versus a duodenal mass. Associated gastric outlet obstruction with marked distention of the stomach and proximal duodenum.  Bibasilar atelectasis and trace left pleural fluid  Probable mild hepatic cirrhosis  Aortic atherosclerosis without occlusion  Incidental renal  cysts  Colonic diverticulosis  These results were called by telephone at the time of interpretation on 09/23/2013 at 6:35 PM to Dr. Derwood Kaplan , who verbally acknowledged these results.   Electronically Signed   By: Ruel Favors M.D.   On: 09/23/2013 18:49   US Abdomen Limited  09/23/2013   CLINICAL DATA:  Vomiting  EXAM: US ABDOMEN LIMITED - RIGHT UPPER QUADRANT  COMPARISON:  None.  FINDINGS: Gallbladder  No gallstones or wall thickening visualized. There is no pericholecystic fluid. No sonographic Murphy sign noted.  Common bile duct  Diameter: 3 mm. There is no intrahepatic or extrahepatic biliary duct dilatation.  Liver:  No focal lesion identified. Liver echogenicity is somewhat increased and inhomogeneous.  IMPRESSION: Liver echogenicity is somewhat increased and inhomogeneous. This appearance is consistent with fatty change and/or underlying parenchymal disease. While no focal liver lesions are identified, it must be cautioned that the sensitivity of ultrasound for focal liver lesions is diminished in this circumstance. Study is otherwise unremarkable.   Electronically Signed   By: Bretta Bang M.D.   On: 09/23/2013 14:48    EKG Interpretation    Date/Time:  Wednesday September 23 2013 11:19:25 EST Ventricular Rate:  103 PR Interval:  180 QRS Duration: 88 QT Interval:  350 QTC Calculation: 458 R Axis:   74 Text Interpretation:  Sinus tachycardia with Premature supraventricular complexes Nonspecific ST and T wave abnormality Abnormal ECG Confirmed by Rhunette Croft, MD, Saadiq Poche ([id number]) on 09/23/2013 9:01:21 PM            MDM  No diagnosis found.  Pt comes in with cc of abd pain, epigastrium, radiating to the chest. [place] ordered - and was negative for any hepatobiliary process.  Pt on reassessment, continued to feel unwell, and have pain - so PUD was considered higher on the ddx - and with a mild leukocytosis and DM hx, CT scan of the abd ordered.  Ct showed active bleeding  around the duodenum.  Spoke with Dr. Juanda Chance, GI - she is to scope patient in the ED.  When i was reporting this findings to the patient, he had his 1st coffee ground emesis. Protonix gtt started. Initial Hb is normal. Pt is slightly tachycardic, no melena BRBPR.  CRITICAL CARE Performed by: Derwood Kaplan   Total critical care time: 75 minutes - active bleeding ulcer with protonix gtt.  Critical care time was exclusive of separately billable procedures and treating other patients.  Critical care was necessary to treat or prevent imminent or life-threatening deterioration.  Critical care was time spent personally by me on the following activities: development of treatment plan with patient and/or surrogate as well as nursing, discussions with consultants,  evaluation of patient's response to treatment, examination of patient, obtaining history from patient or surrogate, ordering and performing treatments and interventions, ordering and review of laboratory studies, ordering and review of radiographic studies, pulse oximetry and re-evaluation of patient's condition.   Derwood Kaplan, MD 09/23/13 2104  9:14 PM Dr. Juanda Chance requesting CCM admission.  Derwood Kaplan, MD 09/23/13 2114

## 2013-09-23 NOTE — ED Notes (Signed)
MD at bedside. Patterson Hammersmith, MD Gastroenterology

## 2013-09-24 ENCOUNTER — Encounter (HOSPITAL_COMMUNITY): Payer: Self-pay | Admitting: Internal Medicine

## 2013-09-24 DIAGNOSIS — D500 Iron deficiency anemia secondary to blood loss (chronic): Secondary | ICD-10-CM

## 2013-09-24 DIAGNOSIS — K922 Gastrointestinal hemorrhage, unspecified: Secondary | ICD-10-CM

## 2013-09-24 DIAGNOSIS — K559 Vascular disorder of intestine, unspecified: Secondary | ICD-10-CM

## 2013-09-24 LAB — GLUCOSE, CAPILLARY
Glucose-Capillary: 123 mg/dL — ABNORMAL HIGH (ref 70–99)
Glucose-Capillary: 132 mg/dL — ABNORMAL HIGH (ref 70–99)
Glucose-Capillary: 148 mg/dL — ABNORMAL HIGH (ref 70–99)
Glucose-Capillary: 157 mg/dL — ABNORMAL HIGH (ref 70–99)
Glucose-Capillary: 198 mg/dL — ABNORMAL HIGH (ref 70–99)

## 2013-09-24 LAB — POCT I-STAT 3, ART BLOOD GAS (G3+)
Acid-Base Excess: 4 mmol/L — ABNORMAL HIGH (ref 0.0–2.0)
Bicarbonate: 26.3 mEq/L — ABNORMAL HIGH (ref 20.0–24.0)
O2 Saturation: 98 %
Patient temperature: 98.6
TCO2: 27 mmol/L (ref 0–100)
pCO2 arterial: 31.3 mmHg — ABNORMAL LOW (ref 35.0–45.0)
pH, Arterial: 7.532 — ABNORMAL HIGH (ref 7.350–7.450)
pO2, Arterial: 96 mmHg (ref 80.0–100.0)

## 2013-09-24 LAB — CBC
HCT: 35.2 % — ABNORMAL LOW (ref 39.0–52.0)
HCT: 27.6 % — ABNORMAL LOW (ref 39.0–52.0)
HCT: 32.4 % — ABNORMAL LOW (ref 39.0–52.0)
HCT: 28.1 % — ABNORMAL LOW (ref 39.0–52.0)
HCT: 27.6 % — ABNORMAL LOW (ref 39.0–52.0)
Hemoglobin: 12 g/dL — ABNORMAL LOW (ref 13.0–17.0)
Hemoglobin: 9.5 g/dL — ABNORMAL LOW (ref 13.0–17.0)
Hemoglobin: 11 g/dL — ABNORMAL LOW (ref 13.0–17.0)
Hemoglobin: 9.5 g/dL — ABNORMAL LOW (ref 13.0–17.0)
Hemoglobin: 9.3 g/dL — ABNORMAL LOW (ref 13.0–17.0)
MCH: 31.8 pg (ref 26.0–34.0)
MCH: 31.4 pg (ref 26.0–34.0)
MCH: 31.9 pg (ref 26.0–34.0)
MCH: 31.9 pg (ref 26.0–34.0)
MCH: 32.1 pg (ref 26.0–34.0)
MCHC: 34.1 g/dL (ref 30.0–36.0)
MCHC: 34.4 g/dL (ref 30.0–36.0)
MCHC: 34 g/dL (ref 30.0–36.0)
MCHC: 33.8 g/dL (ref 30.0–36.0)
MCHC: 33.7 g/dL (ref 30.0–36.0)
MCV: 93.4 fL (ref 78.0–100.0)
MCV: 91.1 fL (ref 78.0–100.0)
MCV: 93.9 fL (ref 78.0–100.0)
MCV: 94.3 fL (ref 78.0–100.0)
MCV: 95.2 fL (ref 78.0–100.0)
Platelets: 314 10*3/uL (ref 150–400)
Platelets: 372 10*3/uL (ref 150–400)
Platelets: 303 10*3/uL (ref 150–400)
Platelets: 289 10*3/uL (ref 150–400)
Platelets: 264 10*3/uL (ref 150–400)
RBC: 3.77 MIL/uL — ABNORMAL LOW (ref 4.22–5.81)
RBC: 3.03 MIL/uL — ABNORMAL LOW (ref 4.22–5.81)
RBC: 3.45 MIL/uL — ABNORMAL LOW (ref 4.22–5.81)
RBC: 2.98 MIL/uL — ABNORMAL LOW (ref 4.22–5.81)
RBC: 2.9 MIL/uL — ABNORMAL LOW (ref 4.22–5.81)
RDW: 15 % (ref 11.5–15.5)
RDW: 15.1 % (ref 11.5–15.5)
RDW: 15 % (ref 11.5–15.5)
RDW: 15.3 % (ref 11.5–15.5)
RDW: 15.4 % (ref 11.5–15.5)
WBC: 19.3 10*3/uL — ABNORMAL HIGH (ref 4.0–10.5)
WBC: 22.4 10*3/uL — ABNORMAL HIGH (ref 4.0–10.5)
WBC: 20.6 10*3/uL — ABNORMAL HIGH (ref 4.0–10.5)
WBC: 20.2 10*3/uL — ABNORMAL HIGH (ref 4.0–10.5)
WBC: 18.5 10*3/uL — ABNORMAL HIGH (ref 4.0–10.5)

## 2013-09-24 LAB — PREPARE RBC (CROSSMATCH)

## 2013-09-24 LAB — BASIC METABOLIC PANEL
BUN: 37 mg/dL — ABNORMAL HIGH (ref 6–23)
CO2: 26 mEq/L (ref 19–32)
Calcium: 8.5 mg/dL (ref 8.4–10.5)
Chloride: 103 mEq/L (ref 96–112)
Creatinine, Ser: 1.28 mg/dL (ref 0.50–1.35)
GFR calc Af Amer: 56 mL/min — ABNORMAL LOW (ref >90)
GFR calc non Af Amer: 49 mL/min — ABNORMAL LOW (ref >90)
Glucose, Bld: 123 mg/dL — ABNORMAL HIGH (ref 70–99)
Potassium: 4.1 mEq/L (ref 3.5–5.1)
Sodium: 144 mEq/L (ref 135–145)

## 2013-09-24 LAB — TROPONIN I: Troponin I: <0.3 ng/mL (ref <0.30)

## 2013-09-24 LAB — LACTIC ACID, PLASMA
Lactic Acid, Venous: 3.5 mmol/L — ABNORMAL HIGH (ref 0.5–2.2)
Lactic Acid, Venous: 2.8 mmol/L — ABNORMAL HIGH (ref 0.5–2.2)
Lactic Acid, Venous: 2.8 mmol/L — ABNORMAL HIGH (ref 0.5–2.2)
Lactic Acid, Venous: 2 mmol/L (ref 0.5–2.2)
Lactic Acid, Venous: 2.2 mmol/L (ref 0.5–2.2)

## 2013-09-24 LAB — MRSA PCR SCREENING: MRSA by PCR: NEGATIVE

## 2013-09-24 LAB — MAGNESIUM: Magnesium: 1.4 mg/dL — ABNORMAL LOW (ref 1.5–2.5)

## 2013-09-24 LAB — PHOSPHORUS: Phosphorus: 3.8 mg/dL (ref 2.3–4.6)

## 2013-09-24 MED ORDER — PHENYLEPHRINE HCL 10 MG/ML IJ SOLN
30.0000 ug/min | INTRAVENOUS | Status: DC
  Filled 2013-09-24: qty 4

## 2013-09-24 MED ORDER — SODIUM CHLORIDE 0.9 % IV BOLUS (SEPSIS)
500.0000 mL | Freq: Once | INTRAVENOUS | Status: AC
  Administered 2013-09-24: 500 mL via INTRAVENOUS

## 2013-09-24 MED ORDER — SODIUM CHLORIDE 0.9 % IV SOLN
INTRAVENOUS | Status: DC
  Administered 2013-09-24 – 2013-10-02 (×4): via INTRAVENOUS

## 2013-09-24 MED ORDER — SODIUM CHLORIDE 0.9 % IV SOLN: 80.0000 mg | Freq: Once | INTRAVENOUS | Status: DC

## 2013-09-24 MED ORDER — METRONIDAZOLE IN NACL 5-0.79 MG/ML-% IV SOLN
500.0000 mg | Freq: Three times a day (TID) | INTRAVENOUS | Status: DC
  Administered 2013-09-24 – 2013-09-27 (×9): 500 mg via INTRAVENOUS
  Filled 2013-09-24 (×11): qty 100

## 2013-09-24 MED ORDER — METRONIDAZOLE IN NACL 5-0.79 MG/ML-% IV SOLN
500.0000 mg | Freq: Three times a day (TID) | INTRAVENOUS | Status: DC
  Filled 2013-09-24: qty 100

## 2013-09-24 MED ORDER — BISACODYL 10 MG RE SUPP
10.0000 mg | Freq: Every day | RECTAL | Status: DC | PRN
  Administered 2013-09-24: 10 mg via RECTAL
  Filled 2013-09-24: qty 1

## 2013-09-24 MED ORDER — CIPROFLOXACIN IN D5W 400 MG/200ML IV SOLN
400.0000 mg | Freq: Two times a day (BID) | INTRAVENOUS | Status: DC
  Administered 2013-09-24 – 2013-09-26 (×6): 400 mg via INTRAVENOUS
  Filled 2013-09-24 (×8): qty 200

## 2013-09-24 MED ORDER — SODIUM CHLORIDE 0.9 % IV SOLN: 250.0000 mL | INTRAVENOUS | Status: DC | PRN

## 2013-09-24 MED ORDER — PHENYLEPHRINE HCL 10 MG/ML IJ SOLN
30.0000 ug/min | INTRAVENOUS | Status: DC
  Administered 2013-09-24: 100 ug/min via INTRAVENOUS
  Filled 2013-09-24: qty 1

## 2013-09-24 MED ORDER — LISINOPRIL 10 MG PO TABS
10.0000 mg | ORAL_TABLET | Freq: Every day | ORAL | Status: DC
  Filled 2013-09-24: qty 1

## 2013-09-24 MED ORDER — PNEUMOCOCCAL VAC POLYVALENT 25 MCG/0.5ML IJ INJ
0.5000 mL | INJECTION | INTRAMUSCULAR | Status: AC
  Administered 2013-09-25: 0.5 mL via INTRAMUSCULAR
  Filled 2013-09-24: qty 0.5

## 2013-09-24 MED ORDER — MAGNESIUM SULFATE 40 MG/ML IJ SOLN
2.0000 g | Freq: Once | INTRAMUSCULAR | Status: AC
  Administered 2013-09-24: 2 g via INTRAVENOUS
  Filled 2013-09-24: qty 50

## 2013-09-24 MED ORDER — PANTOPRAZOLE SODIUM 40 MG IV SOLR: 40.0000 mg | Freq: Once | INTRAVENOUS | Status: DC

## 2013-09-24 MED ORDER — INSULIN ASPART 100 UNIT/ML ~~LOC~~ SOLN
0.0000 [IU] | SUBCUTANEOUS | Status: DC
  Administered 2013-09-24 – 2013-09-28 (×7): 2 [IU] via SUBCUTANEOUS

## 2013-09-24 MED ORDER — PANTOPRAZOLE SODIUM 40 MG PO TBEC: 40.0000 mg | DELAYED_RELEASE_TABLET | Freq: Every day | ORAL | Status: DC

## 2013-09-24 MED ORDER — MENTHOL 3 MG MT LOZG
1.0000 | LOZENGE | OROMUCOSAL | Status: DC | PRN
  Administered 2013-09-24: 3 mg via ORAL
  Filled 2013-09-24 (×2): qty 9

## 2013-09-24 MED ORDER — CIPROFLOXACIN IN D5W 400 MG/200ML IV SOLN
400.0000 mg | Freq: Two times a day (BID) | INTRAVENOUS | Status: DC
  Filled 2013-09-24: qty 200

## 2013-09-24 MED ORDER — METRONIDAZOLE IN NACL 5-0.79 MG/ML-% IV SOLN: 500.0000 mg | Freq: Three times a day (TID) | INTRAVENOUS | Status: DC

## 2013-09-24 MED ORDER — METRONIDAZOLE IN NACL 5-0.79 MG/ML-% IV SOLN
500.0000 mg | Freq: Three times a day (TID) | INTRAVENOUS | Status: DC
  Administered 2013-09-24: 500 mg via INTRAVENOUS
  Filled 2013-09-24 (×2): qty 100

## 2013-09-24 NOTE — Progress Notes (Signed)
Daily Rounding Note  09/24/2013, 10:39 AM  LOS: 1 day   For Dr Elnoria Howard  SUBJECTIVE:       No nausea.  NGT draining CG material. No BMs.  Throat is sore.  Some abdominal pain but it is overall improved  OBJECTIVE:         Vital signs in last 24 hours:    Temp:  [97.4 F (36.3 C)-98.5 F (36.9 C)] 97.6 F (36.4 C) (12/25 0414) Pulse Rate:  [36-123] 101 (12/25 0800) Resp:  [10-33] 12 (12/25 0800) BP: (81-198)/(48-107) 127/64 mmHg (12/25 0800) SpO2:  [91 %-99 %] 93 % (12/25 0800) Weight:  [76.4 kg (168 lb 6.9 oz)] 76.4 kg (168 lb 6.9 oz) (12/25 0417)   General: looks ill and frail.  Alert   Heart: RRR.  No MRG Chest: clear, no dyspnea Abdomen: soft, liver edge smooth and palpable 6 to 7 cm below costal margin.  BS hypoactive, not distended  Extremities: no pedal edema.  Feet warm Neuro/Psych:  Appropriate, oriented x 3.  Relaxed   Intake/Output from previous day: 2023-10-06 0701 - 12/25 0700 In: 954.6 [I.V.:854.6; IV Piggyback:100] Out: 730 [Urine:350; Emesis/NG output:380]  Intake/Output this shift: Total I/O In: 175 [I.V.:125; IV Piggyback:50] Out: -   Lab Results:  Recent Labs  09/24/13 0135 09/24/13 0415 09/24/13 0905  WBC 19.3* 22.4* 20.6*  HGB 12.0* 9.5* 11.0*  HCT 35.2* 27.6* 32.4*  PLT 314 372 303   BMET  Recent Labs  Oct 05, 2013 1154 09/24/13 0415  NA 138 144  K 3.8 4.1  CL 102 103  CO2 23 26  GLUCOSE 151* 123*  BUN 31* 37*  CREATININE 1.06 1.28  CALCIUM 10.1 8.5   LFT  Recent Labs  2013/10/05 1154  PROT 7.2  ALBUMIN 3.9  AST 25  ALT 17  ALKPHOS 39  BILITOT 0.7   PT/INR  Recent Labs  10/05/13 1154  LABPROT 13.0  INR 1.00   Hepatitis Panel No results found for this basename: HEPBSAG, HCVAB, HEPAIGM, HEPBIGM,  in the last 72 hours  Studies/Results: Dg Chest 1 View Oct 05, 2013   CLINICAL DATA:  Chest pain  EXAM: CHEST - 1 VIEW  COMPARISON:  04/22/2013  FINDINGS: The heart size  and mediastinal contours are within normal limits. Both lungs are clear. The visualized skeletal structures are unremarkable.  IMPRESSION: No active disease.   Electronically Signed   By: Alcide Clever M.D.   On: 10/05/2013 12:22   Ct Abdomen Pelvis W Contrast 10/05/13  FINDINGS: Minor dependent basilar atelectasis bilaterally. Normal heart size. No pericardial effusion. Trace left pleural effusion. Distal esophagus is patulous and fluid-filled, suspect esophageal dysmotility. Coronary calcifications noted. Degenerative changes of the lower thoracic spine with large osteophytes.  Abdomen: Mild nodularity to the liver surface compatible with cirrhosis. No biliary dilatation. Gallbladder, biliary system, pancreas, spleen, and adrenal glands are within normal limits for age and demonstrate no acute finding. Incidental splenic calcification compatible granulomatous disease, image 29.  The stomach and proximal duodenum are distended with hyperdense heterogeneous fluid. Third portion of the duodenum demonstrates intraluminal dense contrast. This is compatible with contrast extravasation/active duodenal bleeding. This portion of the duodenum demonstrates circumferential wall thickening. Appearance is compatible with bleeding from a duodenal ulcer with duodenitis versus a duodenum mass.  Kidneys demonstrate hypodense renal cysts bilaterally. Largest cyst in the left kidney midpole measures 13 mm, image 38. No renal obstruction or hydronephrosis. No obstructing urinary tract calculi.  Atherosclerotic  changes of the aorta diffusely. Negative for aneurysm or dissection.  No free air, fluid collection, or abscess. Diffuse colonic diverticulosis present.  Pelvis: Extensive sigmoid region diverticulosis. No pelvic free fluid, fluid collection, hemorrhage, abscess, or adenopathy. No inguinal abnormality or hernia. Urinary bladder unremarkable. Previous right hip arthroplasty noted. Diffuse degenerative changes of the spine and  pelvis.  IMPRESSION: Active contrast extravasation/ bleeding within the distal duodenum in an area of duodenal wall thickening compatible with duodenal ulcer disease with duodenitis versus a duodenal mass. Associated gastric outlet obstruction with marked distention of the stomach and proximal duodenum.  Bibasilar atelectasis and trace left pleural fluid  Probable mild hepatic cirrhosis  Aortic atherosclerosis without occlusion  Incidental renal cysts  Colonic diverticulosis  These results were called by telephone at the time of interpretation on 09/23/2013 at 6:35 PM to Dr. Derwood Kaplan , who verbally acknowledged these results.   Electronically Signed   By: Ruel Favors M.D.   On: 09/23/2013 18:49   US Abdomen Limited 09/23/2013   CLINICAL DATA:  Vomiting  EXAM: US ABDOMEN LIMITED - RIGHT UPPER QUADRANT  COMPARISON:  None.  FINDINGS: Gallbladder  No gallstones or wall thickening visualized. There is no pericholecystic fluid. No sonographic Murphy sign noted.  Common bile duct  Diameter: 3 mm. There is no intrahepatic or extrahepatic biliary duct dilatation.  Liver:  No focal lesion identified. Liver echogenicity is somewhat increased and inhomogeneous.  IMPRESSION: Liver echogenicity is somewhat increased and inhomogeneous. This appearance is consistent with fatty change and/or underlying parenchymal disease. While no focal liver lesions are identified, it must be cautioned that the sensitivity of ultrasound for focal liver lesions is diminished in this circumstance. Study is otherwise unremarkable.   Electronically Signed   By: Bretta Bang M.D.   On: 09/23/2013 14:48    ASSESMENT:   *  GIB.  CG emesis.  EGD 12/24 with severe esophagitis, blood in stomach and duodenum but no active bleeding, duodenal ulcerations c/w ischemia GOO by CT scan. Has not required transfusions thus far.  GIB requiring 4 units of blood in 2012 with EGD by DR Randa Evens showing nodular lesion in duodenum, endockips and  epinephrine therapy at that time. Says he did not take PPI afterwards, none for a few years.   *  Fatty liver vs parenchymal disease. .   *  Ischemic CVA 2012  *  DM2, diet controlled.       PLAN   *  Continue Protonix drip, for 72 hours.    *  Repeat EGD in 1 to 2 days.  KUB in AM. Dr Elnoria Howard will assume care tomorrow.  *  Continue NGT. *  Add Cepacol lozenges.  *  h. pylori serum test.     Jennye Moccasin  09/24/2013, 10:39 AM Pager: [phone]  GI Attending Note  I have personally taken an interval history, reviewed the chart, and examined the patient. GI bleeding is slowing.  He's at increased risk for mesenteric ischemia and therefore has to be watched for signs including abdominal pain, acidosis.  Await biopsy report from duodenum.  Barbette Hair. Arlyce Dice, MD, [facility] [place] [facility] [phone]

## 2013-09-24 NOTE — Progress Notes (Signed)
Dr. Tyson Alias made aware of current hgb 9.5, and patient tachycardic in the 140's. CBC checks q6 ordered, no orders to transfuse per MD. Will continue to monitor patient closely.  Corliss Skains RN

## 2013-09-24 NOTE — Progress Notes (Addendum)
Dr. Darrick Penna made aware of continuous bigeminy and frequent occasional trigeminy rhythm. Lab contacted to draw AM labs early. 0135 hgb check 12.0 from 13.8 prior hgb .  Vitals within normal limits, patient  alert and oriented x4 will continue to monitor patient.  Corliss Skains RN

## 2013-09-24 NOTE — Progress Notes (Addendum)
Dr. Vassie Loll at the bedside for central line insertion, daughter Annice Pih updated about bright red blood out of NG tube.  Corliss Skains RN

## 2013-09-24 NOTE — Progress Notes (Signed)
eLink Physician-Brief Progress Note Patient Name: [name] DOB: 12-27-1923 MRN: [id number]  Date of Service  09/24/2013   HPI/Events of Note   hgb 9.5, gi last scope wihtout active bleeding No presdsors, hgb now 9.5, lactic cleariong  eICU Interventions  No TX, cbc q6h   Intervention Category Major Interventions: Hypovolemia - evaluation and treatment with fluids  Nelda Bucks. 09/24/2013, 8:25 PM

## 2013-09-24 NOTE — Progress Notes (Signed)
79yo male c/o vomiting since Sunday (progressed to coffee grounds in ED) w/ mild mid chest tightness, CT reveals UGIB, admitted to ICU d/t high risk of rebleed, to begin IV ABX for concern for ischemic bowel.  Will start Cipro 400mg  IV Q12H for CrCl ~50 ml/min and monitor CBC and Cx.  Vernard Gambles, PharmD, BCPS  09/24/2013 12:22 AM

## 2013-09-24 NOTE — H&P (Signed)
PULMONARY / CRITICAL CARE MEDICINE  Name: [name] MRN: [id number] DOB: [birth year]    ADMISSION DATE:  09/23/2013 CONSULTATION DATE:  09/23/2013  REFERRING MD :  EDP PRIMARY SERVICE: PCCM  CHIEF COMPLAINT:  Upper GII bleeding  BRIEF PATIENT DESCRIPTION: 79 yo with upper GI hemorrhage from multiple duodenal ulcers.  SIGNIFICANT EVENTS / STUDIES:  12/24  CT abdomen 12/24 >>> Contrast extravasation within the distal duodenum. Gastric outlet obstruction with marked distention of the stomach and proximal duodenum. Mild hepatic cirrhosis. 12/24  US abdomen >>> Liver echogenicity  Increased ? Fatty liver 12/24  EGD >>> Severe esophagitis, excessive duodenal ulceration, suspected ischemic duodenitis   LINES / TUBES: NGT 12/24 >>>  CULTURES:  ANTIBIOTICS: Ciprofloxacin 12/24 >>> Flagyl 12/24 >>>  INTERVAL HISTORY:  About 400 mL of dark blood aspirated via NGT since last night.  VITAL SIGNS: Temp:  [97.4 F (36.3 C)-98.5 F (36.9 C)] 97.6 F (36.4 C) (12/25 0414) Pulse Rate:  [36-123] 102 (12/25 0700) Resp:  [10-33] 13 (12/25 0700) BP: (81-198)/(48-107) 124/60 mmHg (12/25 0700) SpO2:  [91 %-99 %] 95 % (12/25 0700) Weight:  [76.4 kg (168 lb 6.9 oz)] 76.4 kg (168 lb 6.9 oz) (12/25 0417)  HEMODYNAMICS:   VENTILATOR SETTINGS:   INTAKE / OUTPUT: Intake/Output     12/24 0701 - 12/25 0700 12/25 0701 - 12/26 0700   I.V. (mL/kg) 729.6 (9.5)    IV Piggyback 100    Total Intake(mL/kg) 829.6 (10.9)    Urine (mL/kg/hr) 350    Emesis/NG output 380    Total Output 730     Net +99.6            PHYSICAL EXAMINATION: General:  No distress, resting comfortable Neuro:  Sleepy, but arouses to voice HEENT:  PERRL, NGT Cardiovascular:  Tachycardic, regular Lungs:  CTAB Abdomen:  Soft, diffuse tenderness, no rebound, bowel sounds diminished Musculoskeletal:  Moves all extremities, no edema Skin:  Intact  LABS:  CBC  Recent Labs Lab 09/23/13 1154 09/23/13 1951  09/24/13 0135  WBC 14.3* 21.6* 19.3*  HGB 14.5 13.8 12.0*  HCT 42.6 39.6 35.2*  PLT 374 383 314   Coag's  Recent Labs Lab 09/23/13 1154  INR 1.00   BMET  Recent Labs Lab 09/23/13 1154 09/24/13 0415  NA 138 144  K 3.8 4.1  CL 102 103  CO2 23 26  BUN 31* 37*  CREATININE 1.06 1.28  GLUCOSE 151* 123*   Electrolytes  Recent Labs Lab 09/23/13 1154 09/24/13 0415  CALCIUM 10.1 8.5  MG  --  1.4*  PHOS  --  3.8   Sepsis Markers  Recent Labs Lab 09/23/13 2304 09/24/13 0430  LATICACIDVEN 3.98* 3.5*   ABG No results found for this basename: PHART, PCO2ART, PO2ART,  in the last 168 hours Liver Enzymes  Recent Labs Lab 09/23/13 1154  AST 25  ALT 17  ALKPHOS 39  BILITOT 0.7  ALBUMIN 3.9   Cardiac Enzymes  Recent Labs Lab 09/24/13 0135  TROPONINI <0.30   Glucose  Recent Labs Lab 09/24/13 0007  GLUCAP 198*   CXR:  None today  ASSESSMENT / PLAN:  PULMONARY A: Mild hypoxia post sedation for EGD - resolved. At risk for aspiration. P:   Goal SpO2>92 Supplemental oxygen PRN May need arway  CARDIOVASCULAR A: Hemodynamically stable. No ischemia. H/o HTN. Mildly elevated lactate. P:  Goal MAP>65 Hold Lisinopril, ASA Trend lactate  RENAL A:  AKI. Hypomagnesemia. P:   Trend BMP Mg 2 x  1 NS@100   GASTROINTESTINAL A:  Severe esophagitis. Multiple duodenal ulcers. Concern for mesenteric  ischemia P:   GI following NGT to Sx Protonix gtt May need mesenteric angiogram by IR  HEMATOLOGIC A:  Acute blood loss anemia P:  Trend CBC Goal Hb>10 as actively bleeding  INFECTIOUS A:  Concern for ischemic bowel. P:   Abx as above  ENDOCRINE A:  Diet controlled DM2. P:   SSI  NEUROLOGIC A:  No active issues. P:   No intervention required  I have personally obtained a history, examined the patient, evaluated laboratory and imaging results, formulated the assessment and plan and placed orders.  CRITICAL CARE: The patient is critically  ill with multiple organ systems failure and requires high complexity decision making for assessment and support, frequent evaluation and titration of therapies, application of advanced monitoring technologies and extensive interpretation of multiple databases. Critical Care Time devoted to patient care services described in this note is 35 minutes.   Lonia Farber, MD Pulmonary and Critical Care Medicine [facility] Pager: [phone]  09/24/2013, 7:26 AM

## 2013-09-24 NOTE — Progress Notes (Addendum)
Change in blood color to bright red ng tube, patient tachycardic and hypotensensive, Dr. Tyson Alias made aware. Stat EKG done labs draw, blood bank contacted awaiting for 1 unit PRBC to transfuse per MD orders, 500 cc bolus NS ongoing. Family contacted and updated.  Corliss Skains RN

## 2013-09-24 NOTE — Progress Notes (Signed)
eLink Physician-Brief Progress Note Patient Name: [name] DOB: Feb 17, 1924 MRN: [id number]  Date of Service  09/24/2013   HPI/Events of Note   Some change in blood nbgt and volume, tachy now, drop in BP, mentation good  eICU Interventions  Stat cbc, lactic, abg prbc empiric TX STAT eval above, may need recopse vs IR   Intervention Category Major Interventions: Hemorrhage - evaluation and management  Nelda Bucks. 09/24/2013, 9:29 PM

## 2013-09-25 ENCOUNTER — Inpatient Hospital Stay (HOSPITAL_COMMUNITY): Payer: Medicare Other

## 2013-09-25 ENCOUNTER — Encounter (HOSPITAL_COMMUNITY): Payer: Self-pay | Admitting: Radiology

## 2013-09-25 ENCOUNTER — Other Ambulatory Visit (HOSPITAL_COMMUNITY): Payer: Medicare Other

## 2013-09-25 ENCOUNTER — Encounter (HOSPITAL_COMMUNITY)
Admission: EM | Disposition: A | Discharge status: Home Health | Payer: Medicare Other | Source: Home / Self Care | Attending: Pulmonary Disease

## 2013-09-25 HISTORY — PX: ESOPHAGOGASTRODUODENOSCOPY: SHX5428

## 2013-09-25 LAB — GLUCOSE, CAPILLARY
Glucose-Capillary: 143 mg/dL — ABNORMAL HIGH (ref 70–99)
Glucose-Capillary: 127 mg/dL — ABNORMAL HIGH (ref 70–99)
Glucose-Capillary: 136 mg/dL — ABNORMAL HIGH (ref 70–99)
Glucose-Capillary: 136 mg/dL — ABNORMAL HIGH (ref 70–99)
Glucose-Capillary: 61 mg/dL — ABNORMAL LOW (ref 70–99)
Glucose-Capillary: 80 mg/dL (ref 70–99)
Glucose-Capillary: 89 mg/dL (ref 70–99)

## 2013-09-25 LAB — BASIC METABOLIC PANEL
BUN: 49 mg/dL — ABNORMAL HIGH (ref 6–23)
CO2: 23 mEq/L (ref 19–32)
Calcium: 7.2 mg/dL — ABNORMAL LOW (ref 8.4–10.5)
Chloride: 110 mEq/L (ref 96–112)
Creatinine, Ser: 1.33 mg/dL (ref 0.50–1.35)
GFR calc Af Amer: 54 mL/min — ABNORMAL LOW (ref >90)
GFR calc non Af Amer: 46 mL/min — ABNORMAL LOW (ref >90)
Glucose, Bld: 141 mg/dL — ABNORMAL HIGH (ref 70–99)
Potassium: 3.3 mEq/L — ABNORMAL LOW (ref 3.5–5.1)
Sodium: 142 mEq/L (ref 135–145)

## 2013-09-25 LAB — POCT I-STAT 3, ART BLOOD GAS (G3+)
Acid-Base Excess: 1 mmol/L (ref 0.0–2.0)
Bicarbonate: 23.8 mEq/L (ref 20.0–24.0)
O2 Saturation: 98 %
Patient temperature: 97.8
TCO2: 25 mmol/L (ref 0–100)
pCO2 arterial: 30.4 mmHg — ABNORMAL LOW (ref 35.0–45.0)
pH, Arterial: 7.499 — ABNORMAL HIGH (ref 7.350–7.450)
pO2, Arterial: 94 mmHg (ref 80.0–100.0)

## 2013-09-25 LAB — CBC
HCT: 27.7 % — ABNORMAL LOW (ref 39.0–52.0)
HCT: 27.9 % — ABNORMAL LOW (ref 39.0–52.0)
HCT: 28.2 % — ABNORMAL LOW (ref 39.0–52.0)
HCT: 29.4 % — ABNORMAL LOW (ref 39.0–52.0)
Hemoglobin: 10 g/dL — ABNORMAL LOW (ref 13.0–17.0)
Hemoglobin: 9.3 g/dL — ABNORMAL LOW (ref 13.0–17.0)
Hemoglobin: 9.4 g/dL — ABNORMAL LOW (ref 13.0–17.0)
Hemoglobin: 9.5 g/dL — ABNORMAL LOW (ref 13.0–17.0)
MCH: 30.3 pg (ref 26.0–34.0)
MCH: 30.5 pg (ref 26.0–34.0)
MCH: 30.5 pg (ref 26.0–34.0)
MCH: 30.9 pg (ref 26.0–34.0)
MCHC: 33.3 g/dL (ref 30.0–36.0)
MCHC: 33.7 g/dL (ref 30.0–36.0)
MCHC: 33.9 g/dL (ref 30.0–36.0)
MCHC: 34 g/dL (ref 30.0–36.0)
MCV: 89.4 fL (ref 78.0–100.0)
MCV: 89.6 fL (ref 78.0–100.0)
MCV: 90.7 fL (ref 78.0–100.0)
MCV: 92.7 fL (ref 78.0–100.0)
Platelets: 225 10*3/uL (ref 150–400)
Platelets: 232 10*3/uL (ref 150–400)
Platelets: 234 10*3/uL (ref 150–400)
Platelets: 253 10*3/uL (ref 150–400)
RBC: 3.01 MIL/uL — ABNORMAL LOW (ref 4.22–5.81)
RBC: 3.1 MIL/uL — ABNORMAL LOW (ref 4.22–5.81)
RBC: 3.11 MIL/uL — ABNORMAL LOW (ref 4.22–5.81)
RBC: 3.28 MIL/uL — ABNORMAL LOW (ref 4.22–5.81)
RDW: 16 % — ABNORMAL HIGH (ref 11.5–15.5)
RDW: 16.3 % — ABNORMAL HIGH (ref 11.5–15.5)
RDW: 16.9 % — ABNORMAL HIGH (ref 11.5–15.5)
RDW: 17.2 % — ABNORMAL HIGH (ref 11.5–15.5)
WBC: 15.3 10*3/uL — ABNORMAL HIGH (ref 4.0–10.5)
WBC: 15.8 10*3/uL — ABNORMAL HIGH (ref 4.0–10.5)
WBC: 16.8 10*3/uL — ABNORMAL HIGH (ref 4.0–10.5)
WBC: 17.5 10*3/uL — ABNORMAL HIGH (ref 4.0–10.5)

## 2013-09-25 LAB — MAGNESIUM: Magnesium: 1.9 mg/dL (ref 1.5–2.5)

## 2013-09-25 LAB — AMYLASE: Amylase: 730 U/L — ABNORMAL HIGH (ref 0–105)

## 2013-09-25 LAB — PREPARE RBC (CROSSMATCH)

## 2013-09-25 LAB — LIPASE, BLOOD: Lipase: >3000 U/L — ABNORMAL HIGH (ref 11–59)

## 2013-09-25 LAB — APTT: aPTT: 33 seconds (ref 24–37)

## 2013-09-25 LAB — PROTIME-INR
INR: 1.31 (ref 0.00–1.49)
Prothrombin Time: 16 seconds — ABNORMAL HIGH (ref 11.6–15.2)

## 2013-09-25 SURGERY — EGD (ESOPHAGOGASTRODUODENOSCOPY)
Anesthesia: Moderate Sedation

## 2013-09-25 MED ORDER — AMIODARONE LOAD VIA INFUSION
150.0000 mg | Freq: Once | INTRAVENOUS | Status: AC
  Administered 2013-09-25: 150 mg via INTRAVENOUS
  Filled 2013-09-25: qty 83.34

## 2013-09-25 MED ORDER — FENTANYL CITRATE 0.05 MG/ML IJ SOLN
INTRAMUSCULAR | Status: AC
  Filled 2013-09-25: qty 2

## 2013-09-25 MED ORDER — EPINEPHRINE HCL 0.1 MG/ML IJ SOSY
PREFILLED_SYRINGE | INTRAMUSCULAR | Status: AC
  Filled 2013-09-25: qty 10

## 2013-09-25 MED ORDER — DIPHENHYDRAMINE HCL 50 MG/ML IJ SOLN
INTRAMUSCULAR | Status: AC
  Filled 2013-09-25: qty 1

## 2013-09-25 MED ORDER — AMIODARONE HCL IN DEXTROSE 360-4.14 MG/200ML-% IV SOLN: 60.0000 mg/h | INTRAVENOUS | Status: DC

## 2013-09-25 MED ORDER — FENTANYL CITRATE 0.05 MG/ML IJ SOLN
INTRAMUSCULAR | Status: DC | PRN
  Administered 2013-09-25: 12.5 ug via INTRAVENOUS
  Administered 2013-09-25: 25 ug via INTRAVENOUS

## 2013-09-25 MED ORDER — MIDAZOLAM HCL 5 MG/ML IJ SOLN
INTRAMUSCULAR | Status: AC
  Filled 2013-09-25: qty 1

## 2013-09-25 MED ORDER — AMIODARONE HCL IN DEXTROSE 360-4.14 MG/200ML-% IV SOLN
30.0000 mg/h | INTRAVENOUS | Status: DC
  Administered 2013-09-25 – 2013-09-26 (×4): 30 mg/h via INTRAVENOUS
  Filled 2013-09-25 (×8): qty 200

## 2013-09-25 MED ORDER — AMIODARONE HCL IN DEXTROSE 360-4.14 MG/200ML-% IV SOLN
60.0000 mg/h | INTRAVENOUS | Status: AC
  Administered 2013-09-25 (×2): 60 mg/h via INTRAVENOUS
  Filled 2013-09-25 (×2): qty 200

## 2013-09-25 MED ORDER — SODIUM CHLORIDE 0.9 % IJ SOLN
PREFILLED_SYRINGE | INTRAMUSCULAR | Status: DC | PRN
  Administered 2013-09-25: 13:00:00

## 2013-09-25 MED ORDER — SODIUM CHLORIDE 0.9 % IV SOLN
INTRAVENOUS | Status: DC
  Administered 2013-09-25: 21:00:00 via INTRAVENOUS

## 2013-09-25 MED ORDER — CHLORHEXIDINE GLUCONATE 0.12 % MT SOLN
15.0000 mL | Freq: Two times a day (BID) | OROMUCOSAL | Status: DC
  Administered 2013-09-25 – 2013-09-28 (×6): 15 mL via OROMUCOSAL
  Filled 2013-09-25 (×6): qty 15

## 2013-09-25 MED ORDER — DILTIAZEM HCL 25 MG/5ML IV SOLN
10.0000 mg | Freq: Once | INTRAVENOUS | Status: AC
  Administered 2013-09-25: 10 mg via INTRAVENOUS
  Filled 2013-09-25: qty 5

## 2013-09-25 MED ORDER — BIOTENE DRY MOUTH MT LIQD
15.0000 mL | Freq: Two times a day (BID) | OROMUCOSAL | Status: DC
  Administered 2013-09-25 – 2013-09-27 (×5): 15 mL via OROMUCOSAL

## 2013-09-25 MED ORDER — IOHEXOL 300 MG/ML  SOLN
150.0000 mL | Freq: Once | INTRAMUSCULAR | Status: AC | PRN
  Administered 2013-09-25: 80 mL via INTRA_ARTERIAL

## 2013-09-25 MED ORDER — MIDAZOLAM HCL 2 MG/2ML IJ SOLN
INTRAMUSCULAR | Status: AC | PRN
  Administered 2013-09-25: 0.5 mg via INTRAVENOUS

## 2013-09-25 MED ORDER — AMIODARONE HCL IN DEXTROSE 360-4.14 MG/200ML-% IV SOLN
30.0000 mg/h | INTRAVENOUS | Status: DC
Start: 2013-09-25 — End: 2013-09-25

## 2013-09-25 MED ORDER — FENTANYL CITRATE 0.05 MG/ML IJ SOLN
INTRAMUSCULAR | Status: AC | PRN
  Administered 2013-09-25: 25 ug via INTRAVENOUS

## 2013-09-25 MED ORDER — IOHEXOL 350 MG/ML SOLN
100.0000 mL | Freq: Once | INTRAVENOUS | Status: AC | PRN
  Administered 2013-09-25: 100 mL via INTRAVENOUS

## 2013-09-25 MED ORDER — POTASSIUM CHLORIDE 10 MEQ/50ML IV SOLN
10.0000 meq | INTRAVENOUS | Status: AC
  Administered 2013-09-25 (×3): 10 meq via INTRAVENOUS
  Filled 2013-09-25: qty 50

## 2013-09-25 MED ORDER — MIDAZOLAM HCL 2 MG/2ML IJ SOLN
INTRAMUSCULAR | Status: AC
  Filled 2013-09-25: qty 2

## 2013-09-25 MED ORDER — MIDAZOLAM HCL 10 MG/2ML IJ SOLN
INTRAMUSCULAR | Status: DC | PRN
  Administered 2013-09-25: 1 mg via INTRAVENOUS
  Administered 2013-09-25: 2 mg via INTRAVENOUS

## 2013-09-25 NOTE — Op Note (Signed)
[facility] [address] [place], [address]   OPERATIVE PROCEDURE REPORT  PATIENT: [name], [name]  MR#: [id number] BIRTHDATE: 05-03-1924  GENDER: Male ENDOSCOPIST: Jeani Hawking, MD ASSISTANT:   Claudie Revering, RN CGRN and Kandice Robinsons, technician PROCEDURE DATE: 09/25/2013 PROCEDURE: ASA CLASS:   Class III INDICATIONS:Duodenal GI bleed MEDICATIONS: Versed 3 mg IV and Fentanyl 37.5 mcg IV TOPICAL ANESTHETIC:   Cetacaine Spray  DESCRIPTION OF PROCEDURE:   After the risks benefits and alternatives of the procedure were thoroughly explained, informed consent was obtained.  The     endoscope was introduced through the mouth  and advanced to the third portion of the duodenum Without limitations.      The instrument was slowly withdrawn as the mucosa was fully examined.      FINDINGS: The procedure was extremely difficult to perform.  Upon intial entry into the esophagus there was evidence of a severe esophagitis - LA Grade D.  In the gastric lumen there was fresh blood noted in the antral region.  No evidence of any ulcers or erosions in the gastric lumen.  In the duodenal bulb there was evidence of a very large clot.  The clot was occluding the lumen. There was also evidence of fresh blood around the clot.  With slow persistence, a combination of suctioning and snaring allowed the clot to be reduced in size.  At one point the obstructive clot was able to be pushed distally.  In the 3rd portion of the duodenum, at least this is where I thought the lesion was located, a large visible vessel was noted in the setting of a shallow ulcer.  In fact, shallow ulcerations and erosions were identified throughout the examined duodenum.  It was not as apparent after this area. Two one milliliter injections of 1:10,000 Epi was injected around the site.  The initial hemoclip was able to be placed across the vessel.  Two more hemoclips were placed adjacent to the  vessel.  It was difficult to grasp the mucosa as the area exhibited a shallow ulceration.  I then pushed the pediatric scope distally to push the clot further down and eliminate any other sites of bleeding.  I was not able to see any other lesions.  Reinspection of the vessel revealed that the initial hemoclip was not across the vessel. Another hemoclip was able to be successfully placed.  No bleeding was precipitated and there was no further evidence of bleeding. Retroflexed views revealed no abnormalities.     The scope was then withdrawn from the patient and the procedure terminated.  COMPLICATIONS: There were no complications. IMPRESSION: 1) Massive duodenal visible vessel in the setting of an ulcer s/p hemoclipping. 2) Shallow duodenal ulcers. 3) LA Grade D esophagitis.  RECOMMENDATIONS: 1) IR to coil the lesion.  I spoke with Dr. Bonnielee Haff. 2) Follow HGB. 3) Transfuse as necessary. 4) If bleeding is not able to be controlled a Surgical consultation is required.  _______________________________ eSigned:  Jeani Hawking, MD 09/25/2013 2:17 PM    PATIENT NAME:  [name], [name] MR#: [id number]

## 2013-09-25 NOTE — Consult Note (Signed)
HPI: [name] is an 79 y.o. male with upper GI bleed. Hx of duodenal bleed in past while on Plavix for stroke. Has been off that for a few years and hadn't had any bleeding issues per daughter until now. S/p EGD earlier today finding bleed at duodenum. Per daughter, Dr. Elnoria Howard placed clips---I don't see procedure note yet. IR is asked to eval for angio and possible embolization for ongoing active bleed. PMHx and meds reviewed.  Past Medical History:  Past Medical History  Diagnosis Date  . Hyperlipemia   . CVA (cerebral infarction)   . Hypertension   . Stroke     2011 no deficits   . GERD (gastroesophageal reflux disease)     hx of   . Arthritis   . Ulcer     stomach 2011 related to plavix   . Hx of transfusion of packed red blood cells   . Diabetes mellitus without complication     Diet controlled    Past Surgical History:  Past Surgical History  Procedure Laterality Date  . Total hip arthroplasty Right 04/28/2013    Procedure: RIGHT TOTAL HIP ARTHROPLASTY ANTERIOR APPROACH;  Surgeon: [name], MD;  Location: [facility];  Service: Orthopedics;  Laterality: Right;  . Esophagogastroduodenoscopy N/A 09/23/2013    Procedure: ESOPHAGOGASTRODUODENOSCOPY (EGD);  Surgeon: Hart Carwin, MD;  Location: [facility];  Service: Endoscopy;  Laterality: N/A;    Family History: History reviewed. No pertinent family history.  Social History:  reports that he has never smoked. He has never used smokeless tobacco. He reports that he drinks about 1.8 ounces of alcohol per week. He reports that he does not use illicit drugs.  Allergies: No Known Allergies  Medications: Current facility-administered medications:0.9 %  sodium chloride infusion, 250 mL, Intravenous, PRN, Oretha Milch, MD;  0.9 %  sodium chloride infusion, , Intravenous, Continuous, Oretha Milch, MD, Last Rate: 100 mL/hr at 09/24/13 0025;  amiodarone (NEXTERONE PREMIX) 360 mg/200 mL dextrose IV infusion, 30 mg/hr,  Intravenous, Continuous, Nelda Bucks, MD, Last Rate: 16.7 mL/hr at 09/25/13 1153, 30 mg/hr at 09/25/13 1153 antiseptic oral rinse (BIOTENE) solution 15 mL, 15 mL, Mouth Rinse, q12n4p, Lonia Farber, MD, 15 mL at 09/25/13 1200;  bisacodyl (DULCOLAX) suppository 10 mg, 10 mg, Rectal, Daily PRN, Lonia Farber, MD, 10 mg at 09/24/13 1627;  chlorhexidine (PERIDEX) 0.12 % solution 15 mL, 15 mL, Mouth Rinse, BID, Lonia Farber, MD ciprofloxacin (CIPRO) IVPB 400 mg, 400 mg, Intravenous, Q12H, Lonia Farber, MD, 400 mg at 09/25/13 0919;  EPINEPHrine 1:10,000, 10 mL syringe/NS, 10 mL vial for sclerotherapy inj mixture, , , PRN, Theda Belfast, MD;  fentaNYL (SUBLIMAZE) injection, , , PRN, Theda Belfast, MD, 12.5 mcg at 09/25/13 1211;  insulin aspart (novoLOG) injection 0-15 Units, 0-15 Units, Subcutaneous, Q4H, Lonia Farber, MD, 2 Units at 09/25/13 1340 menthol-cetylpyridinium (CEPACOL) lozenge 3 mg, 1 lozenge, Oral, PRN, Dianah Field, PA-C, 3 mg at 09/24/13 1628;  metroNIDAZOLE (FLAGYL) IVPB 500 mg, 500 mg, Intravenous, Q8H, Lonia Farber, MD, 500 mg at 09/25/13 1338;  midazolam (VERSED) injection, , , PRN, Theda Belfast, MD, 1 mg at 09/25/13 1211 pantoprazole (PROTONIX) 80 mg in sodium chloride 0.9 % 250 mL infusion, 8 mg/hr, Intravenous, Continuous, Ankit Nanavati, MD, Last Rate: 25 mL/hr at 09/25/13 1151, 8 mg/hr at 09/25/13 1151;  phenylephrine (NEO-SYNEPHRINE) 40 mg in dextrose 5 % 250 mL infusion, 30-200 mcg/min, Intravenous, Continuous, Lonia Farber, MD  Please HPI for pertinent positives,  otherwise complete 10 system ROS negative.  Physical Exam: BP 108/47  Pulse 95  Temp(Src) 97.5 F (36.4 C) (Oral)  Resp 13  Ht 5\' 9"  (1.753 m)  Wt 168 lb 6.9 oz (76.4 kg)  BMI 24.86 kg/m2  SpO2 98% Body mass index is 24.86 kg/(m^2).   General Appearance:  Sedated from procedure, but arouseable  Head:  Normocephalic,  without obvious abnormality, atraumatic  ENT: Unremarkable airway  Neck: Supple, symmetrical, trachea midline  Lungs:   Clear to auscultation bilaterally, no w/r/r, respirations unlabored without use of accessory muscles.  Heart:  Regular rate and rhythm, S1, S2 normal, no murmur, rub or gallop.  Abdomen:   Soft, non-tender, non distended.  Extremities: Extremities normal, atraumatic, no cyanosis or edema  Pulses: 2+ and symmetric femoral   Results for orders placed during the hospital encounter of 09/23/13 (from the past 48 hour(s))  URINALYSIS, ROUTINE W REFLEX MICROSCOPIC     Status: None   Collection Time    09/23/13  3:30 PM      Result Value Range   Color, Urine YELLOW  YELLOW   APPearance CLEAR  CLEAR   Specific Gravity, Urine 1.019  1.005 - 1.030   pH 6.5  5.0 - 8.0   Glucose, UA NEGATIVE  NEGATIVE mg/dL   Hgb urine dipstick NEGATIVE  NEGATIVE   Bilirubin Urine NEGATIVE  NEGATIVE   Ketones, ur NEGATIVE  NEGATIVE mg/dL   Protein, ur NEGATIVE  NEGATIVE mg/dL   Urobilinogen, UA 0.2  0.0 - 1.0 mg/dL   Nitrite NEGATIVE  NEGATIVE   Leukocytes, UA NEGATIVE  NEGATIVE   Comment: MICROSCOPIC NOT DONE ON URINES WITH NEGATIVE PROTEIN, BLOOD, LEUKOCYTES, NITRITE, OR GLUCOSE <1000 mg/dL.  TYPE AND SCREEN     Status: None   Collection Time    09/23/13  7:43 PM      Result Value Range   ABO/RH(D) A POS     Antibody Screen NEG     Sample Expiration 09/26/2013     Unit Number [id number]     Blood Component Type RED CELLS,LR     Unit division 00     Status of Unit REL FROM [facility]     Transfusion Status DO NOT ISSUE FOR TRANSFUSION     Crossmatch Result INCOMPATIBLE     Unit Number [id number]     Blood Component Type RED CELLS,LR     Unit division 00     Status of Unit ISSUED,FINAL     Transfusion Status OK TO TRANSFUSE     Crossmatch Result COMPATIBLE     Unit Number [id number]     Blood Component Type RED CELLS,LR     Unit division 00     Status of Unit ISSUED      Transfusion Status OK TO TRANSFUSE     Crossmatch Result COMPATIBLE     Unit Number [id number]     Blood Component Type RED CELLS,LR     Unit division 00     Status of Unit ALLOCATED     Transfusion Status OK TO TRANSFUSE     Crossmatch Result COMPATIBLE     Unit Number [id number]     Blood Component Type RED CELLS,LR     Unit division 00     Status of Unit ALLOCATED     Transfusion Status OK TO TRANSFUSE     Crossmatch Result COMPATIBLE     Unit Number [id number]     Blood Component Type RED CELLS,LR  Unit division 00     Status of Unit ALLOCATED     Transfusion Status OK TO TRANSFUSE     Crossmatch Result COMPATIBLE     Unit Number [id number]     Blood Component Type RED CELLS,LR     Unit division 00     Status of Unit ALLOCATED     Transfusion Status OK TO TRANSFUSE     Crossmatch Result COMPATIBLE    CBC     Status: Abnormal   Collection Time    09/23/13  7:51 PM      Result Value Range   WBC 21.6 (*) 4.0 - 10.5 K/uL   RBC 4.28  4.22 - 5.81 MIL/uL   Hemoglobin 13.8  13.0 - 17.0 g/dL   HCT 21.3  08.6 - 57.8 %   MCV 92.5  78.0 - 100.0 fL   MCH 32.2  26.0 - 34.0 pg   MCHC 34.8  30.0 - 36.0 g/dL   RDW 46.9  62.9 - 52.8 %   Platelets 383  150 - 400 K/uL  CG4 I-STAT (LACTIC ACID)     Status: Abnormal   Collection Time    09/23/13 11:04 PM      Result Value Range   Lactic Acid, Venous 3.98 (*) 0.5 - 2.2 mmol/L  MRSA PCR SCREENING     Status: None   Collection Time    09/23/13 11:47 PM      Result Value Range   MRSA by PCR NEGATIVE  NEGATIVE   Comment:            The GeneXpert MRSA Assay (FDA     approved for NASAL specimens     only), is one component of a     comprehensive MRSA colonization     surveillance program. It is not     intended to diagnose MRSA     infection nor to guide or     monitor treatment for     MRSA infections.  GLUCOSE, CAPILLARY     Status: Abnormal   Collection Time    09/24/13 12:07 AM      Result Value Range    Glucose-Capillary 198 (*) 70 - 99 mg/dL   Comment 1 Notify RN    CBC     Status: Abnormal   Collection Time    09/24/13  1:35 AM      Result Value Range   WBC 19.3 (*) 4.0 - 10.5 K/uL   RBC 3.77 (*) 4.22 - 5.81 MIL/uL   Hemoglobin 12.0 (*) 13.0 - 17.0 g/dL   HCT 41.3 (*) 24.4 - 01.0 %   MCV 93.4  78.0 - 100.0 fL   MCH 31.8  26.0 - 34.0 pg   MCHC 34.1  30.0 - 36.0 g/dL   RDW 27.2  53.6 - 64.4 %   Platelets 314  150 - 400 K/uL  TROPONIN I     Status: None   Collection Time    09/24/13  1:35 AM      Result Value Range   Troponin I <0.30  <0.30 ng/mL   Comment:            Due to the release kinetics of cTnI,     a negative result within the first hours     of the onset of symptoms does not rule out     myocardial infarction with certainty.     If myocardial infarction is still suspected,     repeat  the test at appropriate intervals.  CBC     Status: Abnormal   Collection Time    09/24/13  4:15 AM      Result Value Range   WBC 22.4 (*) 4.0 - 10.5 K/uL   RBC 3.03 (*) 4.22 - 5.81 MIL/uL   Hemoglobin 9.5 (*) 13.0 - 17.0 g/dL   Comment: REPEATED TO VERIFY   HCT 27.6 (*) 39.0 - 52.0 %   MCV 91.1  78.0 - 100.0 fL   MCH 31.4  26.0 - 34.0 pg   MCHC 34.4  30.0 - 36.0 g/dL   RDW 16.1  09.6 - 04.5 %   Platelets 372  150 - 400 K/uL  BASIC METABOLIC PANEL     Status: Abnormal   Collection Time    09/24/13  4:15 AM      Result Value Range   Sodium 144  135 - 145 mEq/L   Potassium 4.1  3.5 - 5.1 mEq/L   Chloride 103  96 - 112 mEq/L   CO2 26  19 - 32 mEq/L   Glucose, Bld 123 (*) 70 - 99 mg/dL   BUN 37 (*) 6 - 23 mg/dL   Creatinine, Ser 4.09  0.50 - 1.35 mg/dL   Calcium 8.5  8.4 - 81.1 mg/dL   GFR calc non Af Amer 49 (*) >90 mL/min   GFR calc Af Amer 56 (*) >90 mL/min   Comment: (NOTE)     The eGFR has been calculated using the CKD EPI equation.     This calculation has not been validated in all clinical situations.     eGFR's persistently <90 mL/min signify possible Chronic  Kidney     Disease.  MAGNESIUM     Status: Abnormal   Collection Time    09/24/13  4:15 AM      Result Value Range   Magnesium 1.4 (*) 1.5 - 2.5 mg/dL  PHOSPHORUS     Status: None   Collection Time    09/24/13  4:15 AM      Result Value Range   Phosphorus 3.8  2.3 - 4.6 mg/dL  LACTIC ACID, PLASMA     Status: Abnormal   Collection Time    09/24/13  4:30 AM      Result Value Range   Lactic Acid, Venous 3.5 (*) 0.5 - 2.2 mmol/L  GLUCOSE, CAPILLARY     Status: Abnormal   Collection Time    09/24/13  8:41 AM      Result Value Range   Glucose-Capillary 123 (*) 70 - 99 mg/dL  CBC     Status: Abnormal   Collection Time    09/24/13  9:05 AM      Result Value Range   WBC 20.6 (*) 4.0 - 10.5 K/uL   RBC 3.45 (*) 4.22 - 5.81 MIL/uL   Hemoglobin 11.0 (*) 13.0 - 17.0 g/dL   HCT 91.4 (*) 78.2 - 95.6 %   MCV 93.9  78.0 - 100.0 fL   MCH 31.9  26.0 - 34.0 pg   MCHC 34.0  30.0 - 36.0 g/dL   RDW 21.3  08.6 - 57.8 %   Platelets 303  150 - 400 K/uL  LACTIC ACID, PLASMA     Status: Abnormal   Collection Time    09/24/13  9:05 AM      Result Value Range   Lactic Acid, Venous 2.8 (*) 0.5 - 2.2 mmol/L  GLUCOSE, CAPILLARY     Status: Abnormal  Collection Time    09/24/13 12:00 PM      Result Value Range   Glucose-Capillary 148 (*) 70 - 99 mg/dL  LACTIC ACID, PLASMA     Status: Abnormal   Collection Time    09/24/13  1:58 PM      Result Value Range   Lactic Acid, Venous 2.8 (*) 0.5 - 2.2 mmol/L  GLUCOSE, CAPILLARY     Status: Abnormal   Collection Time    09/24/13  3:18 PM      Result Value Range   Glucose-Capillary 132 (*) 70 - 99 mg/dL  GLUCOSE, CAPILLARY     Status: Abnormal   Collection Time    09/24/13  7:42 PM      Result Value Range   Glucose-Capillary 157 (*) 70 - 99 mg/dL  LACTIC ACID, PLASMA     Status: None   Collection Time    09/24/13  7:45 PM      Result Value Range   Lactic Acid, Venous 2.0  0.5 - 2.2 mmol/L  CBC     Status: Abnormal   Collection Time    09/24/13   7:45 PM      Result Value Range   WBC 20.2 (*) 4.0 - 10.5 K/uL   RBC 2.98 (*) 4.22 - 5.81 MIL/uL   Hemoglobin 9.5 (*) 13.0 - 17.0 g/dL   HCT 16.1 (*) 09.6 - 04.5 %   MCV 94.3  78.0 - 100.0 fL   MCH 31.9  26.0 - 34.0 pg   MCHC 33.8  30.0 - 36.0 g/dL   RDW 40.9  81.1 - 91.4 %   Platelets 289  150 - 400 K/uL  PREPARE RBC (CROSSMATCH)     Status: None   Collection Time    09/24/13  9:25 PM      Result Value Range   Order Confirmation ORDER PROCESSED BY BLOOD BANK    LACTIC ACID, PLASMA     Status: None   Collection Time    09/24/13  9:48 PM      Result Value Range   Lactic Acid, Venous 2.2  0.5 - 2.2 mmol/L  CBC     Status: Abnormal   Collection Time    09/24/13  9:48 PM      Result Value Range   WBC 18.5 (*) 4.0 - 10.5 K/uL   RBC 2.90 (*) 4.22 - 5.81 MIL/uL   Hemoglobin 9.3 (*) 13.0 - 17.0 g/dL   HCT 78.2 (*) 95.6 - 21.3 %   MCV 95.2  78.0 - 100.0 fL   MCH 32.1  26.0 - 34.0 pg   MCHC 33.7  30.0 - 36.0 g/dL   RDW 08.6  57.8 - 46.9 %   Platelets 264  150 - 400 K/uL  POCT I-STAT 3, BLOOD GAS (G3+)     Status: Abnormal   Collection Time    09/24/13 10:09 PM      Result Value Range   pH, Arterial 7.532 (*) 7.350 - 7.450   pCO2 arterial 31.3 (*) 35.0 - 45.0 mmHg   pO2, Arterial 96.0  80.0 - 100.0 mmHg   Bicarbonate 26.3 (*) 20.0 - 24.0 mEq/L   TCO2 27  0 - 100 mmol/L   O2 Saturation 98.0     Acid-Base Excess 4.0 (*) 0.0 - 2.0 mmol/L   Patient temperature 98.6 F     Collection site RADIAL, ALLEN'S TEST ACCEPTABLE     Drawn by RT     Sample type  ARTERIAL    GLUCOSE, CAPILLARY     Status: Abnormal   Collection Time    09/24/13 11:14 PM      Result Value Range   Glucose-Capillary 143 (*) 70 - 99 mg/dL   Comment 1 Notify RN    CBC     Status: Abnormal   Collection Time    09/25/13 12:05 AM      Result Value Range   WBC 17.5 (*) 4.0 - 10.5 K/uL   RBC 3.01 (*) 4.22 - 5.81 MIL/uL   Hemoglobin 9.3 (*) 13.0 - 17.0 g/dL   HCT 09.8 (*) 11.9 - 14.7 %   MCV 92.7  78.0 - 100.0  fL   MCH 30.9  26.0 - 34.0 pg   MCHC 33.3  30.0 - 36.0 g/dL   RDW 82.9 (*) 56.2 - 13.0 %   Platelets 253  150 - 400 K/uL  PREPARE RBC (CROSSMATCH)     Status: None   Collection Time    09/25/13 12:30 AM      Result Value Range   Order Confirmation ORDER PROCESSED BY BLOOD BANK    PROTIME-INR     Status: Abnormal   Collection Time    09/25/13 12:30 AM      Result Value Range   Prothrombin Time 16.0 (*) 11.6 - 15.2 seconds   INR 1.31  0.00 - 1.49  APTT     Status: None   Collection Time    09/25/13 12:30 AM      Result Value Range   aPTT 33  24 - 37 seconds  MAGNESIUM     Status: None   Collection Time    09/25/13  1:55 AM      Result Value Range   Magnesium 1.9  1.5 - 2.5 mg/dL  BASIC METABOLIC PANEL     Status: Abnormal   Collection Time    09/25/13  2:26 AM      Result Value Range   Sodium 142  135 - 145 mEq/L   Potassium 3.3 (*) 3.5 - 5.1 mEq/L   Comment: DELTA CHECK NOTED   Chloride 110  96 - 112 mEq/L   CO2 23  19 - 32 mEq/L   Glucose, Bld 141 (*) 70 - 99 mg/dL   BUN 49 (*) 6 - 23 mg/dL   Creatinine, Ser 8.65  0.50 - 1.35 mg/dL   Calcium 7.2 (*) 8.4 - 10.5 mg/dL   GFR calc non Af Amer 46 (*) >90 mL/min   GFR calc Af Amer 54 (*) >90 mL/min   Comment: (NOTE)     The eGFR has been calculated using the CKD EPI equation.     This calculation has not been validated in all clinical situations.     eGFR's persistently <90 mL/min signify possible Chronic Kidney     Disease.  CBC     Status: Abnormal   Collection Time    09/25/13  2:26 AM      Result Value Range   WBC 15.8 (*) 4.0 - 10.5 K/uL   RBC 3.11 (*) 4.22 - 5.81 MIL/uL   Hemoglobin 9.5 (*) 13.0 - 17.0 g/dL   HCT 78.4 (*) 69.6 - 29.5 %   MCV 90.7  78.0 - 100.0 fL   MCH 30.5  26.0 - 34.0 pg   MCHC 33.7  30.0 - 36.0 g/dL   RDW 28.4 (*) 13.2 - 44.0 %   Platelets 225  150 - 400 K/uL  AMYLASE  Status: Abnormal   Collection Time    09/25/13  2:26 AM      Result Value Range   Amylase 730 (*) 0 - 105 U/L   LIPASE, BLOOD     Status: Abnormal   Collection Time    09/25/13  2:26 AM      Result Value Range   Lipase >3000 (*) 11 - 59 U/L  GLUCOSE, CAPILLARY     Status: Abnormal   Collection Time    09/25/13  2:57 AM      Result Value Range   Glucose-Capillary 127 (*) 70 - 99 mg/dL   Comment 1 Notify RN    POCT I-STAT 3, BLOOD GAS (G3+)     Status: Abnormal   Collection Time    09/25/13  3:10 AM      Result Value Range   pH, Arterial 7.499 (*) 7.350 - 7.450   pCO2 arterial 30.4 (*) 35.0 - 45.0 mmHg   pO2, Arterial 94.0  80.0 - 100.0 mmHg   Bicarbonate 23.8  20.0 - 24.0 mEq/L   TCO2 25  0 - 100 mmol/L   O2 Saturation 98.0     Acid-Base Excess 1.0  0.0 - 2.0 mmol/L   Patient temperature 97.8 F     Collection site ARTERIAL LINE     Drawn by RT     Sample type ARTERIAL    GLUCOSE, CAPILLARY     Status: Abnormal   Collection Time    09/25/13  8:02 AM      Result Value Range   Glucose-Capillary 136 (*) 70 - 99 mg/dL  CBC     Status: Abnormal   Collection Time    09/25/13  8:26 AM      Result Value Range   WBC 16.8 (*) 4.0 - 10.5 K/uL   RBC 3.28 (*) 4.22 - 5.81 MIL/uL   Hemoglobin 10.0 (*) 13.0 - 17.0 g/dL   HCT 40.9 (*) 81.1 - 91.4 %   MCV 89.6  78.0 - 100.0 fL   MCH 30.5  26.0 - 34.0 pg   MCHC 34.0  30.0 - 36.0 g/dL   RDW 78.2 (*) 95.6 - 21.3 %   Platelets 234  150 - 400 K/uL  GLUCOSE, CAPILLARY     Status: Abnormal   Collection Time    09/25/13 11:15 AM      Result Value Range   Glucose-Capillary 136 (*) 70 - 99 mg/dL   Ct Abdomen Pelvis W Contrast  09/23/2013   CLINICAL DATA:  Vomiting, epigastric pain  EXAM: CT ABDOMEN AND PELVIS WITH CONTRAST  TECHNIQUE: Multidetector CT imaging of the abdomen and pelvis was performed using the standard protocol following bolus administration of intravenous contrast.  CONTRAST:  OMNIPAQUE IOHEXOL 300 MG/ML  SOLN  COMPARISON:  09/23/2013 abdominal ultrasound  FINDINGS: Minor dependent basilar atelectasis bilaterally. Normal heart  size. No pericardial effusion. Trace left pleural effusion. Distal esophagus is patulous and fluid-filled, suspect esophageal dysmotility. Coronary calcifications noted. Degenerative changes of the lower thoracic spine with large osteophytes.  Abdomen: Mild nodularity to the liver surface compatible with cirrhosis. No biliary dilatation. Gallbladder, biliary system, pancreas, spleen, and adrenal glands are within normal limits for age and demonstrate no acute finding. Incidental splenic calcification compatible granulomatous disease, image 29.  The stomach and proximal duodenum are distended with hyperdense heterogeneous fluid. Third portion of the duodenum demonstrates intraluminal dense contrast. This is compatible with contrast extravasation/active duodenal bleeding. This portion of the duodenum demonstrates circumferential  wall thickening. Appearance is compatible with bleeding from a duodenal ulcer with duodenitis versus a duodenum mass.  Kidneys demonstrate hypodense renal cysts bilaterally. Largest cyst in the left kidney midpole measures 13 mm, image 38. No renal obstruction or hydronephrosis. No obstructing urinary tract calculi.  Atherosclerotic changes of the aorta diffusely. Negative for aneurysm or dissection.  No free air, fluid collection, or abscess. Diffuse colonic diverticulosis present.  Pelvis: Extensive sigmoid region diverticulosis. No pelvic free fluid, fluid collection, hemorrhage, abscess, or adenopathy. No inguinal abnormality or hernia. Urinary bladder unremarkable. Previous right hip arthroplasty noted. Diffuse degenerative changes of the spine and pelvis.  IMPRESSION: Active contrast extravasation/ bleeding within the distal duodenum in an area of duodenal wall thickening compatible with duodenal ulcer disease with duodenitis versus a duodenal mass. Associated gastric outlet obstruction with marked distention of the stomach and proximal duodenum.  Bibasilar atelectasis and trace left  pleural fluid  Probable mild hepatic cirrhosis  Aortic atherosclerosis without occlusion  Incidental renal cysts  Colonic diverticulosis  These results were called by telephone at the time of interpretation on 09/23/2013 at 6:35 PM to Dr. Derwood Kaplan , who verbally acknowledged these results.   Electronically Signed   By: Ruel Favors M.D.   On: 09/23/2013 18:49   US Abdomen Limited  09/23/2013   CLINICAL DATA:  Vomiting  EXAM: US ABDOMEN LIMITED - RIGHT UPPER QUADRANT  COMPARISON:  None.  FINDINGS: Gallbladder  No gallstones or wall thickening visualized. There is no pericholecystic fluid. No sonographic Murphy sign noted.  Common bile duct  Diameter: 3 mm. There is no intrahepatic or extrahepatic biliary duct dilatation.  Liver:  No focal lesion identified. Liver echogenicity is somewhat increased and inhomogeneous.  IMPRESSION: Liver echogenicity is somewhat increased and inhomogeneous. This appearance is consistent with fatty change and/or underlying parenchymal disease. While no focal liver lesions are identified, it must be cautioned that the sensitivity of ultrasound for focal liver lesions is diminished in this circumstance. Study is otherwise unremarkable.   Electronically Signed   By: Bretta Bang M.D.   On: 09/23/2013 14:48   Dg Chest Port 1 View  09/25/2013   CLINICAL DATA:  Central line placement.  EXAM: PORTABLE CHEST - 1 VIEW  COMPARISON:  Chest radiograph performed 09/23/2013  FINDINGS: The patient's right IJ line is noted ending about the mid to distal SVC. The enteric tube is noted extending below the diaphragm.  The lungs are hypoexpanded. Mild bibasilar atelectasis is noted. No pleural effusion or pneumothorax is seen.  The cardiomediastinal silhouette is normal in size. No acute osseous abnormalities are identified.  IMPRESSION: 1. Right IJ line noted ending about the mid to distal SVC. 2. Lungs hypoexpanded, with mild bibasilar atelectasis.   Electronically Signed   By: Roanna Raider M.D.   On: 09/25/2013 00:17   Dg Abd Portable 1v  09/25/2013   CLINICAL DATA:  Gastric bleeding. Concern for small bowel obstruction.  EXAM: PORTABLE ABDOMEN - 1 VIEW  COMPARISON:  CT of the abdomen and pelvis performed 09/23/2013  FINDINGS: The visualized bowel gas pattern is grossly unremarkable. There is no evidence of bowel obstruction. Known active bleeding at the duodenum is not characterized on radiograph. The patient's enteric tube is noted overlying the body of the stomach. No free intra-abdominal air is identified, though evaluation for free air is suboptimal on supine views.  No acute osseous abnormalities are seen. Minimal degenerative change is noted along the lumbar spine. The patient's right hip prosthesis is  grossly unremarkable in appearance, though incompletely imaged. Contrast is seen filling the bladder.  IMPRESSION: Unremarkable bowel gas pattern; no free intra-abdominal air seen.   Electronically Signed   By: Roanna Raider M.D.   On: 09/25/2013 02:33   Ct Angio Abd/pel W/ And/or W/o  09/25/2013   CLINICAL DATA:  Upper GI bleed; bright red blood from nasogastric tube.  EXAM: CT ANGIOGRAPHY ABDOMEN AND PELVIS WITH CONTRAST AND WITHOUT CONTRAST  TECHNIQUE: Multidetector CT imaging of the abdomen and pelvis was performed using the standard protocol during bolus administration of intravenous contrast. Multiplanar reconstructed images including MIPs were obtained and reviewed to evaluate the vascular anatomy.  CONTRAST:  OMNIPAQUE IOHEXOL 350 MG/ML SOLN  COMPARISON:  CT of the abdomen and pelvis performed 09/23/2013  FINDINGS: Trace bilateral pleural effusions are seen, left greater than right, with bibasilar airspace opacification likely reflecting atelectasis.  The known focus of acute duodenal hemorrhage is better characterized on the prior study, as arterial phase images remain too early to visualize active contrast extravasation at the duodenum. As before, there is apparent  wall thickening at the second segment of the duodenum; this may reflect an underlying mass or a bleeding duodenal ulcer and associated duodenitis. Mildly heterogeneous blood is again seen distending the duodenum, with the duodenum measuring up to 5.0 cm in diameter. This appearance is grossly unchanged from the prior study.  The patient's enteric tube is seen ending at the body of the stomach. The stomach is largely decompressed, aside from a small amount of high attenuation fluid in the antrum, likely reflecting blood tracking from the duodenum.  The nodular contour of the liver is compatible with cirrhotic change. Minimal residual contrast is noted at the Phrygian cap of the gallbladder. The spleen is diminutive, with a few calcified granulomata. There is mild diffuse soft tissue stranding about the pancreas, more prominent than on the prior study. This may reflect the duodenal process, though would correlate with lipase levels. Peripancreatic nodes remain stable in size. The adrenal glands are unremarkable in appearance.  Mild calcification is noted along the abdominal aorta and its branches. There is slight ectasia of the distal abdominal aorta, without evidence of aneurysmal dilatation. The celiac trunk, superior mesenteric artery, bilateral renal arteries and inferior mesenteric artery remain patent. The inferior vena cava is grossly unremarkable in appearance.  Nonspecific perinephric stranding is noted bilaterally. Scarring is noted near the upper pole of the left kidney. Small bilateral renal cysts are seen. The kidneys are otherwise unremarkable. There is no evidence of hydronephrosis. No renal or ureteral stones are seen.  No free fluid is identified. The small bowel is unremarkable in appearance. The stomach is within normal limits. No acute vascular abnormalities are seen.  The appendix is grossly unremarkable in appearance, without evidence for appendicitis. Scattered diverticulosis is noted along the  entirety of the colon, without evidence of diverticulitis.  The bladder is mildly distended and grossly unremarkable in appearance. Mildly increased attenuation within the bladder may reflect residual contrast from the prior study. The prostate remains normal in size, with scattered calcification. No inguinal lymphadenopathy is seen.  No acute osseous abnormalities are identified. The patient's right hip prosthesis is incompletely imaged but appears grossly unremarkable.  Review of the MIP images confirms the above findings.  IMPRESSION: 1. Known focus of acute duodenal hemorrhage is better characterized on the prior study, as arterial phase images remain too early to visualize active contrast extravasation at the duodenum. As before, there is apparent wall  thickening of the second segment of the duodenum. This may reflect an underlying mass or bleeding duodenal ulcer, and associated duodenitis. Mildly heterogeneous blood again seen distending the duodenum and tracking back into the gastric antrum. The duodenum measures up to 5.0 cm in diameter, relatively stable from the prior study. 2. Mildly increased diffuse soft tissue stranding about the pancreas. This may reflect the duodenal process, though would correlate with lipase levels to exclude mild pancreatitis. 3. Trace bilateral pleural effusions, left greater than right, with bibasilar airspace opacification likely reflecting atelectasis. 4. Changes of hepatic cirrhosis noted. 5. Mild calcification along the abdominal aorta and its branches. 6. Small bilateral renal cysts; scarring near the upper pole of the left kidney. 7. Scattered diverticulosis along the entirety of the colon, without evidence of diverticulitis.   Electronically Signed   By: Roanna Raider M.D.   On: 09/25/2013 03:59    Assessment/Plan Upper GI bleed from duodenal ulcer Case and imaging reviewed with Dr. Bonnielee Haff, agreeable to proceed with angio and possible embolization. Cr 1.33, no hx of  CKD Discussed procedure with daughter,  risks and complications of embolization including bowel ischemia, plaque emboli, and possibility of ongoing bleeding despite embolization. Labs reviewed. Consent signed by daughter in chart.  Brayton El PA-C 09/25/2013, 2:16 PM

## 2013-09-25 NOTE — Procedures (Signed)
Proc - Mesentaric angio Find - No source of GI bleeding. SMA and Celiac selected. No comp

## 2013-09-25 NOTE — Progress Notes (Signed)
1205 NGT removed prior to endoscopy

## 2013-09-25 NOTE — H&P (View-Only) (Signed)
PULMONARY / CRITICAL CARE MEDICINE  Name: [name] MRN: [id number] DOB: 1923-12-05    ADMISSION DATE:  09/23/2013 CONSULTATION DATE:  09/23/2013  REFERRING MD :  EDP PRIMARY SERVICE: PCCM  CHIEF COMPLAINT:  Upper GII bleeding  BRIEF PATIENT DESCRIPTION: 79 yo with upper GI hemorrhage from multiple duodenal ulcers.  SIGNIFICANT EVENTS / STUDIES:  12/24  CT abdomen 12/24 >>> Contrast extravasation within the distal duodenum. Gastric outlet obstruction with marked distention of the stomach and proximal duodenum. Mild hepatic cirrhosis. 12/24  US abdomen >>> Liver echogenicity  Increased ? Fatty liver 12/24  EGD >>> Severe esophagitis, excessive duodenal ulceration, suspected ischemic duodenitis  12/25  New AF-RVR / hypotension >>> Cardizem x 1 + Amiodarone bolus and gtt. CVL placed 12/25  CTA abdomen >>> Thickening of the second segment of the duodenum. Mildly heterogeneous blood again  seen distending the duodenum and tracking back into the gastric antrum. The duodenum measures up to 5.0 cm in diameter, relatively stable from the prior study. Mildly increased diffuse soft tissue stranding about the pancreas. This may reflect the duodenal process, though would correlate with lipase levels to exclude mild pancreatitis. Trace bilateral pleural effusions, left greater than right, with bibasilar airspace opacification likely reflecting atelectasis. Changes of hepatic cirrhosis noted. Mild calcification along the abdominal aorta and its branches. Small bilateral renal cysts; scarring near the upper pole of the left kidney. Scattered diverticulosis along the entirety of the colon, without evidence of diverticulitis.  LINES / TUBES: NGT 12/24 >>> R IJ CVL 12/25 >>>  CULTURES:  ANTIBIOTICS: Ciprofloxacin 12/24 >>> Flagyl 12/24 >>>  INTERVAL HISTORY:  New AF-RVR / hypotension >>> Cardizem x 1 + Amiodarone bolus and gtt. CVL placed. NTT output ~ 600 mL since last night. Transiently on Neo.    VITAL SIGNS: Temp:  [97.8 F (36.6 C)-98.8 F (37.1 C)] 97.8 F (36.6 C) (12/26 0407) Pulse Rate:  [33-145] 70 (12/26 0600) Resp:  [8-27] 19 (12/26 0600) BP: (68-180)/(34-88) 122/58 mmHg (12/26 0600) SpO2:  [92 %-100 %] 99 % (12/26 0600) Arterial Line BP: (111-201)/(39-80) 140/64 mmHg (12/26 0600) Weight:  [76.4 kg (168 lb 6.9 oz)] 76.4 kg (168 lb 6.9 oz) (12/26 0438)  HEMODYNAMICS: CVP:  [2 mmHg-3 mmHg] 3 mmHg  VENTILATOR SETTINGS:   INTAKE / OUTPUT: Intake/Output     12/25 0701 - 12/26 0700 12/26 0701 - 12/27 0700   I.V. (mL/kg) 2992.3 (39.2)    Blood 650    IV Piggyback 750    Total Intake(mL/kg) 4392.3 (57.5)    Urine (mL/kg/hr) 450 (0.2)    Emesis/NG output 1030 (0.6)    Total Output 1480     Net +2912.3          Stool Occurrence 2 x     PHYSICAL EXAMINATION: General:  Looks uncomfortable Neuro:  Sleepy, but arouses to voice HEENT:  PERRL, NGT with dark blood aspirated Cardiovascular:  Irregular, controlled rate Lungs:  CTAB Abdomen:  Tenderness in upper abdomen, no rebound, bowel sounds diminished Musculoskeletal:  Moves all extremities, no edema Skin:  Intact  LABS:  CBC  Recent Labs Lab 09/24/13 2148 09/25/13 0005 09/25/13 0226  WBC 18.5* 17.5* 15.8*  HGB 9.3* 9.3* 9.5*  HCT 27.6* 27.9* 28.2*  PLT 264 253 225   Coag's  Recent Labs Lab 09/23/13 1154 09/25/13 0030  APTT  --  33  INR 1.00 1.31   BMET  Recent Labs Lab 09/23/13 1154 09/24/13 0415 09/25/13 0226  NA 138 144 142  K 3.8 4.1 3.3*  CL 102 103 110  CO2 23 26 23   BUN 31* 37* 49*  CREATININE 1.06 1.28 1.33  GLUCOSE 151* 123* 141*   Electrolytes  Recent Labs Lab 09/23/13 1154 09/24/13 0415 09/25/13 0226  CALCIUM 10.1 8.5 7.2*  MG  --  1.4*  --   PHOS  --  3.8  --    Sepsis Markers  Recent Labs Lab 09/24/13 1358 09/24/13 1945 09/24/13 2148  LATICACIDVEN 2.8* 2.0 2.2   ABG  Recent Labs Lab 09/24/13 2209 09/25/13 0310  PHART 7.532* 7.499*  PCO2ART  31.3* 30.4*  PO2ART 96.0 94.0   Liver Enzymes  Recent Labs Lab 09/23/13 1154  AST 25  ALT 17  ALKPHOS 39  BILITOT 0.7  ALBUMIN 3.9   Cardiac Enzymes  Recent Labs Lab 09/24/13 0135  TROPONINI <0.30   Glucose  Recent Labs Lab 09/24/13 0841 09/24/13 1200 09/24/13 1518 09/24/13 1942 09/24/13 2314 09/25/13 0257  GLUCAP 123* 148* 132* 157* 143* 127*   CXR:  None today  ASSESSMENT / PLAN:  PULMONARY A: Mild hypoxia post sedation for EGD - resolved. At risk for aspiration. P:   Goal SpO2>92 Supplemental oxygen PRN May need arway  CARDIOVASCULAR A: New AF - RVR associated with hypotension, now normotensive and rate controlled. H/o HTN. P:  Goal MAP>65 Hold Lisinopril, ASA Cardizem x 1 by eMD Amiodarone bolus and gtt by MD  RENAL  A:  AKI. Hypomagnesemia. Hypokalemia. P:   Trend BMP NS@100  K 10 x 3 Recheck Mg  GASTROINTESTINAL A:  Severe esophagitis. Multiple duodenal ulcers. Concern for mesenteric Ischemia, but lactate reassuring. Pancreatitis ( Amy 730, Lipase > 3000 ). P:   GI following Repeated EGD considered NGT to Sx Protonix gtt H. Pylori Bx results Will discuss with GI this am  HEMATOLOGIC A:  Acute blood loss anemia P:  Trend CBC q6h Goal Hb>10 as actively bleeding  INFECTIOUS A:  Concern for ischemic bowel. P:   Abx as above  ENDOCRINE A:  Diet controlled DM2. P:   SSI  NEUROLOGIC A:  No active issues. P:   No intervention required  I have personally obtained a history, examined the patient, evaluated laboratory and imaging results, formulated the assessment and plan and placed orders.  CRITICAL CARE: The patient is critically ill with multiple organ systems failure and requires high complexity decision making for assessment and support, frequent evaluation and titration of therapies, application of advanced monitoring technologies and extensive interpretation of multiple databases. Critical Care Time devoted to patient  care services described in this note is 35 minutes.   Lonia Farber, MD Pulmonary and Critical Care Medicine [facility] Pager: [phone]  09/25/2013, 7:51 AM

## 2013-09-25 NOTE — Progress Notes (Signed)
MD order to give 10 mg cardizem acknowledged and carried out.  Corliss Skains RN

## 2013-09-25 NOTE — Progress Notes (Signed)
eLink Physician-Brief Progress Note Patient Name: [name] DOB: [birth year] MRN: [id number]  Date of Service  09/25/2013   HPI/Events of Note   Stop bleeding hours ago  Ct done, appears well Await coags, cbc after prior blood Fib rvr with htn, good clinical ststsu Not a picture of regbleed  eICU Interventions  Card x 1, remains HTN slight   Intervention Category Major Interventions: Arrhythmia - evaluation and management  FEINSTEIN,DANIEL J. 09/25/2013, 4:34 AM

## 2013-09-25 NOTE — Progress Notes (Signed)
eLink Physician-Brief Progress Note Patient Name: [name] DOB: [birth year] MRN: [id number]  Date of Service  09/25/2013   HPI/Events of Note   Much improved BP, aftr just 1 unit prbc. HCT has not been refelctive of major bleeding although some blood noted from ngt. Gi concern has been ischemia and now pt with increased abod pain to some extent No major acidosis, abg wnl   eICU Interventions  For additional unit prbc per Dr Vassie Loll, line placed, will CT angio as was recommended by GI with such good cloinical progress Daughter updated   Intervention Category Major Interventions: Hemorrhage - evaluation and management  FEINSTEIN,DANIEL J. 09/25/2013, 1:55 AM

## 2013-09-25 NOTE — Interval H&P Note (Signed)
History and Physical Interval Note:  09/25/2013 11:45 AM  [name]  has presented today for surgery, with the diagnosis of dudonal bleed   The various methods of treatment have been discussed with the patient and family. After consideration of risks, benefits and other options for treatment, the patient has consented to  Procedure(s): ESOPHAGOGASTRODUODENOSCOPY (EGD) (N/A) as a surgical intervention .  The patient's history has been reviewed, patient examined, no change in status, stable for surgery.  I have reviewed the patient's chart and labs.  Questions were answered to the patient's satisfaction.     Dakwan Pridgen D

## 2013-09-25 NOTE — Progress Notes (Signed)
Utilization review completed. Ebany Bowermaster, RN, BSN. 

## 2013-09-25 NOTE — Progress Notes (Signed)
NG tube removed by Dr. Elnoria Howard for EGD performed at bedside.  After procedure, I asked Dr. Elnoria Howard if he wanted it replaced since pt had been having a moderate amount of dark bloody secretions.  He confirmed he wanted it left out for now.  Pt was instructed to call RN if he felt nauseous.

## 2013-09-25 NOTE — Progress Notes (Signed)
PULMONARY / CRITICAL CARE MEDICINE  Name: [name] MRN: [id number] DOB: 07/20/1924    ADMISSION DATE:  09/23/2013 CONSULTATION DATE:  09/23/2013  REFERRING MD :  EDP PRIMARY SERVICE: PCCM  CHIEF COMPLAINT:  Upper GII bleeding  BRIEF PATIENT DESCRIPTION: 79 yo with upper GI hemorrhage from multiple duodenal ulcers.  SIGNIFICANT EVENTS / STUDIES:  12/24  CT abdomen 12/24 >>> Contrast extravasation within the distal duodenum. Gastric outlet obstruction with marked distention of the stomach and proximal duodenum. Mild hepatic cirrhosis. 12/24  US abdomen >>> Liver echogenicity  Increased ? Fatty liver 12/24  EGD >>> Severe esophagitis, excessive duodenal ulceration, suspected ischemic duodenitis  12/25  New AF-RVR / hypotension >>> Cardizem x 1 + Amiodarone bolus and gtt. CVL placed 12/25  CTA abdomen >>> Thickening of the second segment of the duodenum. Mildly heterogeneous blood again  seen distending the duodenum and tracking back into the gastric antrum. The duodenum measures up to 5.0 cm in diameter, relatively stable from the prior study. Mildly increased diffuse soft tissue stranding about the pancreas. This may reflect the duodenal process, though would correlate with lipase levels to exclude mild pancreatitis. Trace bilateral pleural effusions, left greater than right, with bibasilar airspace opacification likely reflecting atelectasis. Changes of hepatic cirrhosis noted. Mild calcification along the abdominal aorta and its branches. Small bilateral renal cysts; scarring near the upper pole of the left kidney. Scattered diverticulosis along the entirety of the colon, without evidence of diverticulitis.  LINES / TUBES: NGT 12/24 >>> R IJ CVL 12/25 >>>  CULTURES:  ANTIBIOTICS: Ciprofloxacin 12/24 >>> Flagyl 12/24 >>>  INTERVAL HISTORY:  New AF-RVR / hypotension >>> Cardizem x 1 + Amiodarone bolus and gtt. CVL placed. NTT output ~ 600 mL since last night. Transiently on Neo.    VITAL SIGNS: Temp:  [97.8 F (36.6 C)-98.8 F (37.1 C)] 97.8 F (36.6 C) (12/26 0407) Pulse Rate:  [33-145] 70 (12/26 0600) Resp:  [8-27] 19 (12/26 0600) BP: (68-180)/(34-88) 122/58 mmHg (12/26 0600) SpO2:  [92 %-100 %] 99 % (12/26 0600) Arterial Line BP: (111-201)/(39-80) 140/64 mmHg (12/26 0600) Weight:  [76.4 kg (168 lb 6.9 oz)] 76.4 kg (168 lb 6.9 oz) (12/26 0438)  HEMODYNAMICS: CVP:  [2 mmHg-3 mmHg] 3 mmHg  VENTILATOR SETTINGS:   INTAKE / OUTPUT: Intake/Output     12/25 0701 - 12/26 0700 12/26 0701 - 12/27 0700   I.V. (mL/kg) 2992.3 (39.2)    Blood 650    IV Piggyback 750    Total Intake(mL/kg) 4392.3 (57.5)    Urine (mL/kg/hr) 450 (0.2)    Emesis/NG output 1030 (0.6)    Total Output 1480     Net +2912.3          Stool Occurrence 2 x     PHYSICAL EXAMINATION: General:  Looks uncomfortable Neuro:  Sleepy, but arouses to voice HEENT:  PERRL, NGT with dark blood aspirated Cardiovascular:  Irregular, controlled rate Lungs:  CTAB Abdomen:  Tenderness in upper abdomen, no rebound, bowel sounds diminished Musculoskeletal:  Moves all extremities, no edema Skin:  Intact  LABS:  CBC  Recent Labs Lab 09/24/13 2148 09/25/13 0005 09/25/13 0226  WBC 18.5* 17.5* 15.8*  HGB 9.3* 9.3* 9.5*  HCT 27.6* 27.9* 28.2*  PLT 264 253 225   Coag's  Recent Labs Lab 09/23/13 1154 09/25/13 0030  APTT  --  33  INR 1.00 1.31   BMET  Recent Labs Lab 09/23/13 1154 09/24/13 0415 09/25/13 0226  NA 138 144 142    K 3.8 4.1 3.3*  CL 102 103 110  CO2 23 26 23  BUN 31* 37* 49*  CREATININE 1.06 1.28 1.33  GLUCOSE 151* 123* 141*   Electrolytes  Recent Labs Lab 09/23/13 1154 09/24/13 0415 09/25/13 0226  CALCIUM 10.1 8.5 7.2*  MG  --  1.4*  --   PHOS  --  3.8  --    Sepsis Markers  Recent Labs Lab 09/24/13 1358 09/24/13 1945 09/24/13 2148  LATICACIDVEN 2.8* 2.0 2.2   ABG  Recent Labs Lab 09/24/13 2209 09/25/13 0310  PHART 7.532* 7.499*  PCO2ART  31.3* 30.4*  PO2ART 96.0 94.0   Liver Enzymes  Recent Labs Lab 09/23/13 1154  AST 25  ALT 17  ALKPHOS 39  BILITOT 0.7  ALBUMIN 3.9   Cardiac Enzymes  Recent Labs Lab 09/24/13 0135  TROPONINI <0.30   Glucose  Recent Labs Lab 09/24/13 0841 09/24/13 1200 09/24/13 1518 09/24/13 1942 09/24/13 2314 09/25/13 0257  GLUCAP 123* 148* 132* 157* 143* 127*   CXR:  None today  ASSESSMENT / PLAN:  PULMONARY A: Mild hypoxia post sedation for EGD - resolved. At risk for aspiration. P:   Goal SpO2>92 Supplemental oxygen PRN May need arway  CARDIOVASCULAR A: New AF - RVR associated with hypotension, now normotensive and rate controlled. H/o HTN. P:  Goal MAP>65 Hold Lisinopril, ASA Cardizem x 1 by eMD Amiodarone bolus and gtt by MD  RENAL  A:  AKI. Hypomagnesemia. Hypokalemia. P:   Trend BMP NS@100 K 10 x 3 Recheck Mg  GASTROINTESTINAL A:  Severe esophagitis. Multiple duodenal ulcers. Concern for mesenteric Ischemia, but lactate reassuring. Pancreatitis ( Amy 730, Lipase > 3000 ). P:   GI following Repeated EGD considered NGT to Sx Protonix gtt H. Pylori Bx results Will discuss with GI this am  HEMATOLOGIC A:  Acute blood loss anemia P:  Trend CBC q6h Goal Hb>10 as actively bleeding  INFECTIOUS A:  Concern for ischemic bowel. P:   Abx as above  ENDOCRINE A:  Diet controlled DM2. P:   SSI  NEUROLOGIC A:  No active issues. P:   No intervention required  I have personally obtained a history, examined the patient, evaluated laboratory and imaging results, formulated the assessment and plan and placed orders.  CRITICAL CARE: The patient is critically ill with multiple organ systems failure and requires high complexity decision making for assessment and support, frequent evaluation and titration of therapies, application of advanced monitoring technologies and extensive interpretation of multiple databases. Critical Care Time devoted to patient  care services described in this note is 35 minutes.   Leib Elahi, MD Pulmonary and Critical Care Medicine [facility] Pager: [phone]  09/25/2013, 7:51 AM    

## 2013-09-25 NOTE — Procedures (Signed)
Central Venous Catheter Insertion Procedure Note [name] [id number] Apr 18, 1924  Procedure: Insertion of Central Venous Catheter Indications: Assessment of intravascular volume, Drug and/or fluid administration and Frequent blood sampling  Procedure Details Consent: Risks of procedure as well as the alternatives and risks of each were explained to the (patient/caregiver).  Consent for procedure obtained. Time Out: Verified patient identification, verified procedure, site/side was marked, verified correct patient position, special equipment/implants available, medications/allergies/relevent history reviewed, required imaging and test results available.  Performed  Maximum sterile technique was used including antiseptics, cap, gloves, gown, hand hygiene, mask and sheet. Skin prep: Chlorhexidine; local anesthetic administered A antimicrobial bonded/coated triple lumen catheter was placed in the right internal jugular vein using the Seldinger technique.  Evaluation Blood flow good Complications: No apparent complications Patient did tolerate procedure well. Chest X-ray ordered to verify placement.  CXR: pending.  [name] V. 09/25/2013, 12:03 AM

## 2013-09-25 NOTE — Progress Notes (Signed)
PULMONARY / CRITICAL CARE MEDICINE  Name: [name] MRN: [id number] DOB: 1923-12-28    ADMISSION DATE:  09/23/2013 CONSULTATION DATE:  09/23/2013  REFERRING MD :  EDP PRIMARY SERVICE: PCCM  CHIEF COMPLAINT:  Upper GII bleeding  BRIEF PATIENT DESCRIPTION: 79 yo with upper GI hemorrhage from multiple duodenal ulcers.  SIGNIFICANT EVENTS / STUDIES:  12/24  CT abdomen 12/24 >>> Contrast extravasation within the distal duodenum. Gastric outlet obstruction with marked distention of the stomach and proximal duodenum. Mild hepatic cirrhosis. 12/24  US abdomen >>> Liver echogenicity  Increased ? Fatty liver 12/24  EGD >>> Severe esophagitis, excessive duodenal ulceration, suspected ischemic duodenitis   LINES / TUBES: NGT 12/24 >>>  CULTURES:  ANTIBIOTICS: Ciprofloxacin 12/24 >>> Flagyl 12/24 >>>  INTERVAL HISTORY:  Called for rebleeding- frank blood in NG Hypotensive  VITAL SIGNS: Temp:  [97.4 F (36.3 C)-98.8 F (37.1 C)] 98.8 F (37.1 C) (12/26 0002) Pulse Rate:  [56-145] 138 (12/25 2300) Resp:  [8-27] 20 (12/25 2300) BP: (68-128)/(34-79) 109/77 mmHg (12/25 2300) SpO2:  [92 %-100 %] 100 % (12/25 2300) Weight:  [76.4 kg (168 lb 6.9 oz)] 76.4 kg (168 lb 6.9 oz) (12/25 0417)  HEMODYNAMICS:   VENTILATOR SETTINGS:   INTAKE / OUTPUT: Intake/Output     12/25 0701 - 12/26 0700   I.V. (mL/kg) 1675 (21.9)   Blood 325   IV Piggyback 350   Total Intake(mL/kg) 2350 (30.8)   Emesis/NG output 430   Total Output 430   Net +1920         PHYSICAL EXAMINATION: General:  No distress, resting comfortable Neuro: awake, interactive HEENT:  PERRL, NGT -frank blood Cardiovascular:  Tachycardic, regular Lungs:  CTAB Abdomen:  Soft, diffuse tenderness, no rebound, bowel sounds diminished Musculoskeletal:  Moves all extremities, no edema Skin:  Intact  LABS:  CBC  Recent Labs Lab 09/24/13 0905 09/24/13 1945 09/24/13 2148  WBC 20.6* 20.2* 18.5*  HGB 11.0* 9.5* 9.3*   HCT 32.4* 28.1* 27.6*  PLT 303 289 264   Coag's  Recent Labs Lab 09/23/13 1154  INR 1.00   BMET  Recent Labs Lab 09/23/13 1154 09/24/13 0415  NA 138 144  K 3.8 4.1  CL 102 103  CO2 23 26  BUN 31* 37*  CREATININE 1.06 1.28  GLUCOSE 151* 123*   Electrolytes  Recent Labs Lab 09/23/13 1154 09/24/13 0415  CALCIUM 10.1 8.5  MG  --  1.4*  PHOS  --  3.8   Sepsis Markers  Recent Labs Lab 09/24/13 1358 09/24/13 1945 09/24/13 2148  LATICACIDVEN 2.8* 2.0 2.2   ABG  Recent Labs Lab 09/24/13 2209  PHART 7.532*  PCO2ART 31.3*  PO2ART 96.0   Liver Enzymes  Recent Labs Lab 09/23/13 1154  AST 25  ALT 17  ALKPHOS 39  BILITOT 0.7  ALBUMIN 3.9   Cardiac Enzymes  Recent Labs Lab 09/24/13 0135  TROPONINI <0.30   Glucose  Recent Labs Lab 09/24/13 0007 09/24/13 0841 09/24/13 1200 09/24/13 1518 09/24/13 1942 09/24/13 2314  GLUCAP 198* 123* 148* 132* 157* 143*   CXR:  None today  ASSESSMENT / PLAN:  PULMONARY A: Mild hypoxia post sedation for EGD - resolved. At risk for aspiration. P:   Goal SpO2>92 Supplemental oxygen PRN  CARDIOVASCULAR A: Hemodynamically stable. No ischemia. H/o HTN.  Lactic acidosis - resolving P:  Goal MAP>65 Hold Lisinopril, ASA Trend lactate Start neo gtt until more blood given CVP  RENAL A:  AKI. Hypomagnesemia. P:  Trend BMP NS@100   GASTROINTESTINAL A:  Severe esophagitis. Multiple duodenal ulcers. Rebleed 12/25 Concern for mesenteric  Ischemia  but lower lactate  reassuring P:   GI following NGT to Sx Protonix gtt May need mesenteric angiogram by IR  HEMATOLOGIC A:  Acute blood loss anemia P:  Trend CBC Goal Hb>10 as actively bleeding- stay 2 u ahead  INFECTIOUS A:  Concern for ischemic bowel. P:   Abx as above  ENDOCRINE A:  Diet controlled DM2. P:   SSI  NEUROLOGIC A:  No active issues. P:   No intervention required  D/w daughter Annice Pih & pt - he is OK with 'short term' life  support - would be very high risk for surgery   I have personally obtained a history, examined the patient, evaluated laboratory and imaging results, formulated the assessment and plan and placed orders.  CRITICAL CARE: The patient is critically ill with multiple organ systems failure and requires high complexity decision making for assessment and support, frequent evaluation and titration of therapies, application of advanced monitoring technologies and extensive interpretation of multiple databases. Critical Care Time devoted to patient care services described in this note is 35 minutes.   Oretha Milch., MD Pulmonary and Critical Care Medicine [facility] Pager: [phone]  09/25/2013, 12:04 AM

## 2013-09-25 NOTE — Progress Notes (Signed)
  Amiodarone Drug - Drug Interaction Consult Note   Amiodarone is metabolized by the cytochrome P450 system and therefore has the potential to cause many drug interactions. Amiodarone has an average plasma half-life of 50 days (range 20 to 100 days).   There is potential for drug interactions to occur several weeks or months after stopping treatment and the onset of drug interactions may be slow after initiating amiodarone.   []  Statins: Increased risk of myopathy. Simvastatin- restrict dose to 20mg  daily. Other statins: counsel patients to report any muscle pain or weakness immediately.  []  Anticoagulants: Amiodarone can increase anticoagulant effect. Consider warfarin dose reduction. Patients should be monitored closely and the dose of anticoagulant altered accordingly, remembering that amiodarone levels take several weeks to stabilize.  []  Antiepileptics: Amiodarone can increase plasma concentration of phenytoin, the dose should be reduced. Note that small changes in phenytoin dose can result in large changes in levels. Monitor patient and counsel on signs of toxicity.  []  Beta blockers: increased risk of bradycardia, AV block and myocardial depression. Sotalol - avoid concomitant use.  []   Calcium channel blockers (diltiazem and verapamil): increased risk of bradycardia, AV block and myocardial depression.  []   Cyclosporine: Amiodarone increases levels of cyclosporine. Reduced dose of cyclosporine is recommended.  []  Digoxin dose should be halved when amiodarone is started.  []  Diuretics: increased risk of cardiotoxicity if hypokalemia occurs.  []  Oral hypoglycemic agents (glyburide, glipizide, glimepiride): increased risk of hypoglycemia. Patient's glucose levels should be monitored closely when initiating amiodarone therapy.   [x]  Drugs that prolong the QT interval:  Torsades de pointes risk may be increased with concurrent use - avoid if possible.  Monitor QTc, also keep  magnesium/potassium WNL if concurrent therapy can't be avoided. Marland Kitchen Antibiotics: e.g. fluoroquinolones, erythromycin. . Antiarrhythmics: e.g. quinidine, procainamide, disopyramide, sotalol. . Antipsychotics: e.g. phenothiazines, haloperidol.  . Lithium, tricyclic antidepressants, and methadone.

## 2013-09-26 ENCOUNTER — Inpatient Hospital Stay (HOSPITAL_COMMUNITY): Payer: Medicare Other

## 2013-09-26 DIAGNOSIS — R578 Other shock: Secondary | ICD-10-CM

## 2013-09-26 DIAGNOSIS — K922 Gastrointestinal hemorrhage, unspecified: Secondary | ICD-10-CM

## 2013-09-26 DIAGNOSIS — K260 Acute duodenal ulcer with hemorrhage: Secondary | ICD-10-CM | POA: Diagnosis present

## 2013-09-26 LAB — BASIC METABOLIC PANEL
BUN: 48 mg/dL — ABNORMAL HIGH (ref 6–23)
CO2: 24 mEq/L (ref 19–32)
Calcium: 7.2 mg/dL — ABNORMAL LOW (ref 8.4–10.5)
Chloride: 115 mEq/L — ABNORMAL HIGH (ref 96–112)
Creatinine, Ser: 1.42 mg/dL — ABNORMAL HIGH (ref 0.50–1.35)
GFR calc Af Amer: 50 mL/min — ABNORMAL LOW (ref >90)
GFR calc non Af Amer: 43 mL/min — ABNORMAL LOW (ref >90)
Glucose, Bld: 104 mg/dL — ABNORMAL HIGH (ref 70–99)
Potassium: 3.6 mEq/L (ref 3.5–5.1)
Sodium: 145 mEq/L (ref 135–145)

## 2013-09-26 LAB — GLUCOSE, CAPILLARY
Glucose-Capillary: 104 mg/dL — ABNORMAL HIGH (ref 70–99)
Glucose-Capillary: 117 mg/dL — ABNORMAL HIGH (ref 70–99)
Glucose-Capillary: 135 mg/dL — ABNORMAL HIGH (ref 70–99)
Glucose-Capillary: 77 mg/dL (ref 70–99)
Glucose-Capillary: 86 mg/dL (ref 70–99)
Glucose-Capillary: 91 mg/dL (ref 70–99)

## 2013-09-26 LAB — CBC
HCT: 24.9 % — ABNORMAL LOW (ref 39.0–52.0)
HCT: 27.3 % — ABNORMAL LOW (ref 39.0–52.0)
HCT: 27.7 % — ABNORMAL LOW (ref 39.0–52.0)
Hemoglobin: 8.4 g/dL — ABNORMAL LOW (ref 13.0–17.0)
Hemoglobin: 9.5 g/dL — ABNORMAL LOW (ref 13.0–17.0)
Hemoglobin: 9.2 g/dL — ABNORMAL LOW (ref 13.0–17.0)
MCH: 30.7 pg (ref 26.0–34.0)
MCH: 31.7 pg (ref 26.0–34.0)
MCH: 30.3 pg (ref 26.0–34.0)
MCHC: 33.7 g/dL (ref 30.0–36.0)
MCHC: 34.8 g/dL (ref 30.0–36.0)
MCHC: 33.2 g/dL (ref 30.0–36.0)
MCV: 90.9 fL (ref 78.0–100.0)
MCV: 91 fL (ref 78.0–100.0)
MCV: 91.1 fL (ref 78.0–100.0)
Platelets: 210 10*3/uL (ref 150–400)
Platelets: 201 10*3/uL (ref 150–400)
Platelets: 224 10*3/uL (ref 150–400)
RBC: 2.74 MIL/uL — ABNORMAL LOW (ref 4.22–5.81)
RBC: 3 MIL/uL — ABNORMAL LOW (ref 4.22–5.81)
RBC: 3.04 MIL/uL — ABNORMAL LOW (ref 4.22–5.81)
RDW: 17.8 % — ABNORMAL HIGH (ref 11.5–15.5)
RDW: 17.1 % — ABNORMAL HIGH (ref 11.5–15.5)
RDW: 17.1 % — ABNORMAL HIGH (ref 11.5–15.5)
WBC: 13.1 10*3/uL — ABNORMAL HIGH (ref 4.0–10.5)
WBC: 11.1 10*3/uL — ABNORMAL HIGH (ref 4.0–10.5)
WBC: 11 10*3/uL — ABNORMAL HIGH (ref 4.0–10.5)

## 2013-09-26 LAB — PREPARE RBC (CROSSMATCH)

## 2013-09-26 LAB — PROTIME-INR
INR: 1.37 (ref 0.00–1.49)
Prothrombin Time: 16.5 seconds — ABNORMAL HIGH (ref 11.6–15.2)

## 2013-09-26 MED ORDER — MAGNESIUM SULFATE 40 MG/ML IJ SOLN
2.0000 g | Freq: Once | INTRAMUSCULAR | Status: AC
  Administered 2013-09-26: 2 g via INTRAVENOUS
  Filled 2013-09-26: qty 50

## 2013-09-26 MED ORDER — PANTOPRAZOLE SODIUM 40 MG IV SOLR
40.0000 mg | Freq: Two times a day (BID) | INTRAVENOUS | Status: DC
  Administered 2013-09-27 (×2): 40 mg via INTRAVENOUS
  Filled 2013-09-26 (×4): qty 40

## 2013-09-26 MED ORDER — POTASSIUM CHLORIDE 10 MEQ/50ML IV SOLN
10.0000 meq | INTRAVENOUS | Status: AC
  Administered 2013-09-26 (×2): 10 meq via INTRAVENOUS

## 2013-09-26 MED ORDER — SODIUM CHLORIDE 0.9 % IV BOLUS (SEPSIS)
1000.0000 mL | Freq: Once | INTRAVENOUS | Status: AC
  Administered 2013-09-26: 1000 mL via INTRAVENOUS

## 2013-09-26 MED ORDER — DIPHENHYDRAMINE HCL 50 MG/ML IJ SOLN: 25.0000 mg | Freq: Four times a day (QID) | INTRAMUSCULAR | Status: DC | PRN

## 2013-09-26 MED ORDER — ACETAMINOPHEN 325 MG PO TABS
650.0000 mg | ORAL_TABLET | ORAL | Status: DC | PRN
  Administered 2013-10-04 – 2013-10-05 (×3): 650 mg via ORAL
  Filled 2013-09-26 (×3): qty 2

## 2013-09-26 MED ORDER — VITAMIN K1 10 MG/ML IJ SOLN
5.0000 mg | Freq: Once | INTRAMUSCULAR | Status: AC
  Administered 2013-09-26: 5 mg via INTRAVENOUS
  Filled 2013-09-26: qty 0.5

## 2013-09-26 NOTE — Progress Notes (Signed)
PULMONARY / CRITICAL CARE MEDICINE  Name: [name] MRN: [id number] DOB: 1924-08-29    ADMISSION DATE:  09/23/2013 CONSULTATION DATE:  09/23/2013  REFERRING MD :  EDP PRIMARY SERVICE: PCCM  CHIEF COMPLAINT:  Upper GII bleeding  BRIEF PATIENT DESCRIPTION: 79 yo with upper GI hemorrhage from multiple duodenal ulcers.  SIGNIFICANT EVENTS / STUDIES:  12/24  CT abdomen 12/24 >>> Contrast extravasation within the distal duodenum. Gastric outlet obstruction with marked distention of the stomach and proximal duodenum. Mild hepatic cirrhosis. 12/24  US abdomen >>> Liver echogenicity  Increased ? Fatty liver 12/24  EGD >>> Severe esophagitis, excessive duodenal ulceration, suspected ischemic duodenitis  12/25  New AF-RVR / hypotension >>> Cardizem x 1 + Amiodarone bolus and gtt. CVL placed 12/25  CTA abdomen >>> Thickening of the second segment of the duodenum. Mildly heterogeneous blood again  seen distending the duodenum and tracking back into the gastric antrum. The duodenum measures up to 5.0 cm in diameter, relatively stable from the prior study. Mildly increased diffuse soft tissue stranding about the pancreas. This may reflect the duodenal process, though would correlate with lipase levels to exclude mild pancreatitis. Trace bilateral pleural effusions, left greater than right, with bibasilar airspace opacification likely reflecting atelectasis. Changes of hepatic cirrhosis noted. Mild calcification along the abdominal aorta and its branches. Small bilateral renal cysts; scarring near the upper pole of the left kidney. Scattered diverticulosis along the entirety of the colon, without evidence of diverticulitis. 12/26  EGD >>> Esophagitis, massive duodenal visible vessel in setting of an ulcer ( clipped ), shallow duodenal ulcers 12/26  IR >>> No source of bleeding identified  LINES / TUBES: NGT 12/24 >>> 12/26 R IJ CVL 12/25 >>> Foley 12/24 >>> L ard A-line 12/26  >>>  CULTURES:  ANTIBIOTICS: Ciprofloxacin 12/24 >>> Flagyl 12/24 >>>  INTERVAL HISTORY:  Hypotensive requiring Neo-Synephrine.   VITAL SIGNS: Temp:  [97.5 F (36.4 C)-98.6 F (37 C)] 98 F (36.7 C) (12/26 2339) Pulse Rate:  [30-98] 80 (12/27 0700) Resp:  [9-21] 9 (12/27 0700) BP: (80-145)/(28-76) 109/49 mmHg (12/27 0700) SpO2:  [92 %-100 %] 100 % (12/27 0700) Arterial Line BP: (71-150)/(42-66) 119/45 mmHg (12/27 0700) Weight:  [79.4 kg (175 lb 0.7 oz)] 79.4 kg (175 lb 0.7 oz) (12/27 0314)  HEMODYNAMICS: CVP:  [0 mmHg-4 mmHg] 2 mmHg  VENTILATOR SETTINGS:   INTAKE / OUTPUT: Intake/Output     12/26 0701 - 12/27 0700 12/27 0701 - 12/28 0700   I.V. (mL/kg) 3522.5 (44.4)    Blood     IV Piggyback 900    Total Intake(mL/kg) 4422.5 (55.7)    Urine (mL/kg/hr) 595 (0.3)    Emesis/NG output 375 (0.2)    Total Output 970     Net +3452.5          Urine Occurrence 1 x    Stool Occurrence 1 x     PHYSICAL EXAMINATION: General:  No distress Neuro:  Awake, alert, cooperative HEENT:  PERRL, NGT has been removed Cardiovascular:  Irregular, controlled rate Lungs:  CTAB Abdomen:  Mild generalized tenderness Musculoskeletal:  Moves all extremities, no edema Skin:  Intact  LABS:  CBC  Recent Labs Lab 09/25/13 0826 09/25/13 1426 09/26/13 0226  WBC 16.8* 15.3* 13.1*  HGB 10.0* 9.4* 8.4*  HCT 29.4* 27.7* 24.9*  PLT 234 232 210   Coag's  Recent Labs Lab 09/23/13 1154 09/25/13 0030  APTT  --  33  INR 1.00 1.31   BMET  Recent Labs  Lab 09/24/13 0415 09/25/13 0226 09/26/13 0500  NA 144 142 145  K 4.1 3.3* 3.6  CL 103 110 115*  CO2 26 23 24   BUN 37* 49* 48*  CREATININE 1.28 1.33 1.42*  GLUCOSE 123* 141* 104*   Electrolytes  Recent Labs Lab 09/24/13 0415 09/25/13 0155 09/25/13 0226 09/26/13 0500  CALCIUM 8.5  --  7.2* 7.2*  MG 1.4* 1.9  --   --   PHOS 3.8  --   --   --    Sepsis Markers  Recent Labs Lab 09/24/13 1358 09/24/13 1945  09/24/13 2148  LATICACIDVEN 2.8* 2.0 2.2   ABG  Recent Labs Lab 09/24/13 2209 09/25/13 0310  PHART 7.532* 7.499*  PCO2ART 31.3* 30.4*  PO2ART 96.0 94.0   Liver Enzymes  Recent Labs Lab 09/23/13 1154  AST 25  ALT 17  ALKPHOS 39  BILITOT 0.7  ALBUMIN 3.9   Cardiac Enzymes  Recent Labs Lab 09/24/13 0135  TROPONINI <0.30   Glucose  Recent Labs Lab 09/25/13 1115 09/25/13 1630 09/25/13 1726 09/25/13 1929 09/25/13 2337 09/26/13 0411  GLUCAP 136* 61* 80 89 117* 91   CXR:  None today  ASSESSMENT / PLAN:  PULMONARY A: Mild hypoxia post sedation for EGD - resolved. At risk for aspiration. P:   Goal SpO2>92 Supplemental oxygen PRN May need arway  CARDIOVASCULAR A: New AF - RVR associated with hypotension, now normotensive and rate controlled. H/o HTN. P:  Goal MAP>65 Hold Lisinopril, ASA Amiodarone gtt Neo-Synephrine gtt  RENAL  A:  AKI. Hypomagnesemia. Hypokalemia. CVP 2. P:   Trend BMP NS@100  Mg 2 x 1 K 10 x 2 NS 1000 x 1  GASTROINTESTINAL A:  Severe esophagitis. Multiple duodenal ulcers. Concern for mesenteric Ischemia, but lactate reassuring. Pancreatitis ( Amy 730, Lipase > 3000 ).  P:   GI following, clipped 12/26 but did not feel it was successful IR following, no active hemorrhage seen 12/26 Consult surgery Protonix gtt H. Pylori Bx results  HEMATOLOGIC A:  Acute blood loss anemia. P:  Trend CBC q6h Goal Hb>10 as actively bleeding Transfuse 1 unit PRBC  INFECTIOUS A:  Concern for ischemic bowel. P:   Abx as above  ENDOCRINE A:  Diet controlled DM2. P:   SSI  NEUROLOGIC A:  No active issues. P:   No intervention required  I have personally obtained a history, examined the patient, evaluated laboratory and imaging results, formulated the assessment and plan and placed orders.  CRITICAL CARE: The patient is critically ill with multiple organ systems failure and requires high complexity decision making for assessment  and support, frequent evaluation and titration of therapies, application of advanced monitoring technologies and extensive interpretation of multiple databases. Critical Care Time devoted to patient care services described in this note is 35 minutes.   Lonia Farber, MD Pulmonary and Critical Care Medicine [facility] Pager: [phone]  09/26/2013, 7:11 AM

## 2013-09-26 NOTE — Progress Notes (Signed)
Daily Rounding Note  09/26/2013, 11:56 AM  LOS: 3 days   SUBJECTIVE:       Stools dark but look like old blood per RN.  Off pressors.  No nausea, all abdominal pain resolved.  No dizziness on bed rest.    OBJECTIVE:         Vital signs in last 24 hours:    Temp:  [97.8 F (36.6 C)-98.6 F (37 C)] 98 F (36.7 C) (12/27 0845) Pulse Rate:  [63-98] 81 (12/27 1100) Resp:  [9-21] 15 (12/27 1100) BP: (80-131)/(28-95) 127/55 mmHg (12/27 1100) SpO2:  [92 %-100 %] 95 % (12/27 1100) Arterial Line BP: (61-150)/(41-69) 90/69 mmHg (12/27 1100) Weight:  [79.4 kg (175 lb 0.7 oz)] 79.4 kg (175 lb 0.7 oz) (12/27 0314) Last BM Date: 09/25/13 General: facial rubor, looks unwell.  alert   Heart: irregular, not tachycardic Chest: clear bil.  No dyspnea or cough Abdomen: soft, ND, NT.  No mass or HSM  Extremities: no CCE Neuro/Psych:  Oriented x 3.  Awake, relaxed.   Intake/Output from previous day: 12/26 0701 - 12/27 0700 In: 4567.5 [I.V.:3667.5; IV Piggyback:900] Out: 970 [Urine:595; Emesis/NG output:375]  Intake/Output this shift: Total I/O In: 1326.7 [I.V.:161.7; Blood:165; IV Piggyback:1000] Out: -   Lab Results:  Recent Labs  09/25/13 0826 09/25/13 1426 09/26/13 0226  WBC 16.8* 15.3* 13.1*  HGB 10.0* 9.4* 8.4*  HCT 29.4* 27.7* 24.9*  PLT 234 232 210   BMET  Recent Labs  09/24/13 0415 09/25/13 0226 09/26/13 0500  NA 144 142 145  K 4.1 3.3* 3.6  CL 103 110 115*  CO2 26 23 24   GLUCOSE 123* 141* 104*  BUN 37* 49* 48*  CREATININE 1.28 1.33 1.42*  CALCIUM 8.5 7.2* 7.2*   LFT No results found for this basename: PROT, ALBUMIN, AST, ALT, ALKPHOS, BILITOT, BILIDIR, IBILI,  in the last 72 hours PT/INR  Recent Labs  09/25/13 0030  LABPROT 16.0*  INR 1.31   Hepatitis Panel No results found for this basename: HEPBSAG, HCVAB, HEPAIGM, HEPBIGM,  in the last 72 hours  Studies/Results: Dg Chest Port 1  View 09/25/2013   FINDINGS: The patient's right IJ line is noted ending about the mid to distal SVC. The enteric tube is noted extending below the diaphragm.  The lungs are hypoexpanded. Mild bibasilar atelectasis is noted. No pleural effusion or pneumothorax is seen.  The cardiomediastinal silhouette is normal in size. No acute osseous abnormalities are identified.  IMPRESSION: 1. Right IJ line noted ending about the mid to distal SVC. 2. Lungs hypoexpanded, with mild bibasilar atelectasis.   Electronically Signed   By: Roanna Raider M.D.   On: 09/25/2013 00:17   Dg Abd Portable 1v 09/26/2013   CLINICAL DATA:  Abdominal pain  EXAM: PORTABLE ABDOMEN - 1 VIEW  COMPARISON:  Abdominal films of September 25, 2013.  FINDINGS: The bowel gas pattern does not suggest obstruction or ileus. No abnormal extraluminal gas collections are demonstrated. Portions of the left aspect of the abdomen are excluded from the field-of-view. There is radiodense material in the region of the urinary bladder likely from the previous CT scan. There are surgical clips overlying the right and left L2 transverse processes.  IMPRESSION: There is no evidence of bowel obstruction or ileus. A three-way abdominal series may be useful if further plain film imaging is desired.   Electronically Signed   By: David  [place]   On: 09/26/2013 11:05  Dg Abd Portable 1v 09/25/2013   COMPARISON:  CT of the abdomen and pelvis performed 09/23/2013  FINDINGS: The visualized bowel gas pattern is grossly unremarkable. There is no evidence of bowel obstruction. Known active bleeding at the duodenum is not characterized on radiograph. The patient's enteric tube is noted overlying the body of the stomach. No free intra-abdominal air is identified, though evaluation for free air is suboptimal on supine views.  No acute osseous abnormalities are seen. Minimal degenerative change is noted along the lumbar spine. The patient's right hip prosthesis is grossly  unremarkable in appearance, though incompletely imaged. Contrast is seen filling the bladder.  IMPRESSION: Unremarkable bowel gas pattern; no free intra-abdominal air seen.   Electronically Signed   By: Roanna Raider M.D.   On: 09/25/2013 02:33   Ct Angio Abd/pel W/ And/or W/o 09/25/2013   FINDINGS: Trace bilateral pleural effusions are seen, left greater than right, with bibasilar airspace opacification likely reflecting atelectasis.  The known focus of acute duodenal hemorrhage is better characterized on the prior study, as arterial phase images remain too early to visualize active contrast extravasation at the duodenum. As before, there is apparent wall thickening at the second segment of the duodenum; this may reflect an underlying mass or a bleeding duodenal ulcer and associated duodenitis. Mildly heterogeneous blood is again seen distending the duodenum, with the duodenum measuring up to 5.0 cm in diameter. This appearance is grossly unchanged from the prior study.  The patient's enteric tube is seen ending at the body of the stomach. The stomach is largely decompressed, aside from a small amount of high attenuation fluid in the antrum, likely reflecting blood tracking from the duodenum.  The nodular contour of the liver is compatible with cirrhotic change. Minimal residual contrast is noted at the Phrygian cap of the gallbladder. The spleen is diminutive, with a few calcified granulomata. There is mild diffuse soft tissue stranding about the pancreas, more prominent than on the prior study. This may reflect the duodenal process, though would correlate with lipase levels. Peripancreatic nodes remain stable in size. The adrenal glands are unremarkable in appearance.  Mild calcification is noted along the abdominal aorta and its branches. There is slight ectasia of the distal abdominal aorta, without evidence of aneurysmal dilatation. The celiac trunk, superior mesenteric artery, bilateral renal arteries and  inferior mesenteric artery remain patent. The inferior vena cava is grossly unremarkable in appearance.  Nonspecific perinephric stranding is noted bilaterally. Scarring is noted near the upper pole of the left kidney. Small bilateral renal cysts are seen. The kidneys are otherwise unremarkable. There is no evidence of hydronephrosis. No renal or ureteral stones are seen.  No free fluid is identified. The small bowel is unremarkable in appearance. The stomach is within normal limits. No acute vascular abnormalities are seen.  The appendix is grossly unremarkable in appearance, without evidence for appendicitis. Scattered diverticulosis is noted along the entirety of the colon, without evidence of diverticulitis.  The bladder is mildly distended and grossly unremarkable in appearance. Mildly increased attenuation within the bladder may reflect residual contrast from the prior study. The prostate remains normal in size, with scattered calcification. No inguinal lymphadenopathy is seen.  No acute osseous abnormalities are identified. The patient's right hip prosthesis is incompletely imaged but appears grossly unremarkable.  Review of the MIP images confirms the above findings.  IMPRESSION: 1. Known focus of acute duodenal hemorrhage is better characterized on the prior study, as arterial phase images remain too early to  visualize active contrast extravasation at the duodenum. As before, there is apparent wall thickening of the second segment of the duodenum. This may reflect an underlying mass or bleeding duodenal ulcer, and associated duodenitis. Mildly heterogeneous blood again seen distending the duodenum and tracking back into the gastric antrum. The duodenum measures up to 5.0 cm in diameter, relatively stable from the prior study. 2. Mildly increased diffuse soft tissue stranding about the pancreas. This may reflect the duodenal process, though would correlate with lipase levels to exclude mild pancreatitis. 3.  Trace bilateral pleural effusions, left greater than right, with bibasilar airspace opacification likely reflecting atelectasis. 4. Changes of hepatic cirrhosis noted. 5. Mild calcification along the abdominal aorta and its branches. 6. Small bilateral renal cysts; scarring near the upper pole of the left kidney. 7. Scattered diverticulosis along the entirety of the colon, without evidence of diverticulitis.   Electronically Signed   By: Roanna Raider M.D.   On: 09/25/2013 03:59    ASSESMENT:   *  Upper GIB EGD 12/24 with severe esophagitis, blood in stomach and duodenum but no active bleeding, duodenal ulcerations c/w ischemia EGD 12/26 with massive duodenal visible vessel in the setting of an ulcer s/p hemoclipping, shallow duodenal ulcers, LA Grade D esophagitis. 12/26 mesenteric angiogram found no active bleeding of Celiac or SMA Will finish the PPI drip at 1844 today.    *  GOO by CT scan of 12/24.  By radiograph today:  No SBO or ileus.   *  ABL anemia. Hgb down to 8.4 this AM, got one more unit of blood and will get CBC within the next few hours.   *  Cirrhosis by CT scan. Not an alcoholic.   *  ARF  *  New Afib and RVR, not an anticoagulation candidate at present. On Amio drip.    PLAN   *  Start BID IV Protonix tomorrow (ordered)  Vs another 24 to 48 hours of PPI drip?  Drip ends tonight at 1844.  *  Clear trays.      Jennye Moccasin  09/26/2013, 11:56 AM Pager: [phone]  GI Attending Note  I have personally taken an interval history, reviewed the chart, and examined the patient.  I agree with the extender's note, impression and recommendations.  Barbette Hair. Arlyce Dice, MD, [facility] [phone]

## 2013-09-26 NOTE — Consult Note (Signed)
Seen, agree with above.  Discussed with patient and wife.    Pt with numerous bleeding duodenal ulcers.  It appears that the problematic one is in the 3-4th portion of duodenum by endoscopy report and by KUB with location of clips to left of midline.  This is very difficult surgically.  Generally with 1-2cd portion duodenal ulcers, the gastroduodenal artery is culprit of bleeding and is accessed with straightforward duodenotomy and oversew of GDA.  Wioth this patient, the ulcer is in area of arterial supply via apron of mesenteric arcade.  Is also much more difficult to access surgically in location posterior to mesenteric root.  This portion of duodenum more prone to leak with enterotomy.    Recommend avoidance of surgery if at all possible.  Have rechecked coags, and PT is out a bit.  Will give a dose of vitamin K. Pt more hemodynamically stable, off phenylephrine.    Agree with protonix gtt, NPO, conservative treatment.  Would go to IR again if rebleeds. We are available if other methods fail.

## 2013-09-26 NOTE — Consult Note (Signed)
[name] 11-08-1923  [id number].    Requesting MD: Dr. Marin Shutter Chief Complaint/Reason for Consult: Upper GI bleeding  HPI:  79 y/o very active for his age white male with PMH GERD, PUD, with h/o GI bleeding requiring transfusion presents to [facility] on 09/23/13.  He had been having sudden unexpected nausea and bilious vomiting since Sunday 09/20/13 .  He started to have hematemesis and coffee ground emesis which is why his daughter brought him to the hospital.  He reported having N/V and epigastric abdominal pain after the vomiting started.  No radiating pain, no alleviating or precipitating factors.  He drinks a glass of orange juice daily and drinks sweet tea, but denies any other caffeine, ETOH or tobacco use.  He notes he eats a bland diet otherwise.  He has h/o of one previous episode in 2011 related to his Plavix and aspirin use after CVA/ischemic stroke, but he has been off his plavix for a couple of years.   Hospitalization course as below.  SIGNIFICANT EVENTS / STUDIES:  12/24 CT abdomen 12/24 >>> Contrast extravasation within the distal duodenum. Gastric outlet obstruction with marked distention of the stomach and proximal duodenum. Mild hepatic cirrhosis.  12/24 US abdomen >>> Liver echogenicity Increased ? Fatty liver  12/24 EGD >>> Severe esophagitis, excessive duodenal ulceration, suspected ischemic duodenitis  12/25 New AF-RVR / hypotension >>> Cardizem x 1 + Amiodarone bolus and gtt. CVL placed  12/25 CTA abdomen >>> Thickening of the second segment of the duodenum. Mildly heterogeneous blood again  seen distending the duodenum and tracking back into the gastric antrum. The duodenum measures up to 5.0 cm in diameter, relatively stable from the prior study. Mildly increased diffuse soft tissue stranding about the pancreas. This may reflect the duodenal process, though would correlate with lipase levels to exclude mild pancreatitis. Trace bilateral pleural effusions, left greater  than right, with bibasilar airspace opacification likely reflecting atelectasis. Changes of hepatic cirrhosis noted. Mild calcification along the abdominal aorta and its branches. Small bilateral renal cysts; scarring near the upper pole of the left kidney. Scattered diverticulosis along the entirety of the colon, without evidence of diverticulitis.  12/26 EGD >>> Esophagitis, massive duodenal visible vessel in setting of an ulcer ( clipped ), shallow duodenal ulcers  12/26 IR >>> No source of bleeding identified  We have been asked to determine if surgical intervention is necessary at this time and to be on board in case he starts actively bleeding again.  Pain is significantly improved.  No N/V, admits to dark bloody BM this am, but none since.  Tolerating clear liquids.  No history of abdominal surgeries in the past.  ROS: All systems reviewed and otherwise negative except for as above  History reviewed. No pertinent family history.  Past Medical History  Diagnosis Date  . Hyperlipemia   . CVA (cerebral infarction)   . Hypertension   . Stroke     20 11 no deficits   . GERD (gastroesophageal reflux disease)     hx of   . Arthritis   . Ulcer     stomach 2011 related to plavix   . Hx of transfusion of packed red blood cells   . Diabetes mellitus without complication     Diet controlled    Past Surgical History  Procedure Laterality Date  . Total hip arthroplasty Right 04/28/2013    Procedure: RIGHT TOTAL HIP ARTHROPLASTY ANTERIOR APPROACH;  Surgeon: [name], MD;  Location: [facility] ORS;  Service: Orthopedics;  Laterality: Right;  . Esophagogastroduodenoscopy N/A 09/23/2013    Procedure: ESOPHAGOGASTRODUODENOSCOPY (EGD);  Surgeon: Hart Carwin, MD;  Location: [facility] ENDOSCOPY;  Service: Endoscopy;  Laterality: N/A;    Social History:  reports that he has never smoked. He has never used smokeless tobacco. He reports that he drinks about 1.8 ounces of alcohol per week. He reports that he  does not use illicit drugs.  Allergies: No Known Allergies  Medications Prior to Admission  Medication Sig Dispense Refill  . aspirin EC 81 MG tablet Take 81 mg by mouth daily.      Marland Kitchen lisinopril (PRINIVIL,ZESTRIL) 10 MG tablet Take 1 tablet (10 mg total) by mouth daily.  30 tablet  11  . pantoprazole (PROTONIX) 40 MG tablet Take 40 mg by mouth daily.      . rosuvastatin (CRESTOR) 20 MG tablet Take 20 mg by mouth every morning.       . ergocalciferol (VITAMIN D2) 50000 UNITS capsule Take 50,000 Units by mouth once a week. Patient takes on Mondays        Blood pressure 121/49, pulse 72, temperature 98 F (36.7 C), temperature source Oral, resp. rate 11, height 5\' 9"  (1.753 m), weight 175 lb 0.7 oz (79.4 kg), SpO2 98.00%. Physical Exam: General: pleasant, WD/WN white male who is laying in bed in NAD HEENT: head is normocephalic, atraumatic.  Sclera are noninjected.  PERRL.  Ears and nose without any masses or lesions.  Mouth is pink and dry Heart: rate controlled AFIB.  No obvious murmurs, gallops, or rubs noted.  Palpable pedal pulses bilaterally Lungs: CTAB, no wheezes, rhonchi, or rales noted.  Respiratory effort nonlabored Abd: soft, NT/ND, +BS, no masses, hernias, or organomegaly MS: all 4 extremities are symmetrical with no cyanosis, clubbing, or edema. Skin: warm and dry with no masses, lesions, or rashes Psych: A&Ox3 with an appropriate affect.  Results for orders placed during the hospital encounter of 09/23/13 (from the past 48 hour(s))  LACTIC ACID, PLASMA     Status: Abnormal   Collection Time    09/24/13  1:58 PM      Result Value Range   Lactic Acid, Venous 2.8 (*) 0.5 - 2.2 mmol/L  GLUCOSE, CAPILLARY     Status: Abnormal   Collection Time    09/24/13  3:18 PM      Result Value Range   Glucose-Capillary 132 (*) 70 - 99 mg/dL  GLUCOSE, CAPILLARY     Status: Abnormal   Collection Time    09/24/13  7:42 PM      Result Value Range   Glucose-Capillary 157 (*) 70 - 99  mg/dL  LACTIC ACID, PLASMA     Status: None   Collection Time    09/24/13  7:45 PM      Result Value Range   Lactic Acid, Venous 2.0  0.5 - 2.2 mmol/L  CBC     Status: Abnormal   Collection Time    09/24/13  7:45 PM      Result Value Range   WBC 20.2 (*) 4.0 - 10.5 K/uL   RBC 2.98 (*) 4.22 - 5.81 MIL/uL   Hemoglobin 9.5 (*) 13.0 - 17.0 g/dL   HCT 74.2 (*) 59.5 - 63.8 %   MCV 94.3  78.0 - 100.0 fL   MCH 31.9  26.0 - 34.0 pg   MCHC 33.8  30.0 - 36.0 g/dL   RDW 75.6  43.3 - 29.5 %   Platelets 289  150 - 400 K/uL  PREPARE RBC (CROSSMATCH)     Status: None   Collection Time    09/24/13  9:25 PM      Result Value Range   Order Confirmation ORDER PROCESSED BY BLOOD BANK    LACTIC ACID, PLASMA     Status: None   Collection Time    09/24/13  9:48 PM      Result Value Range   Lactic Acid, Venous 2.2  0.5 - 2.2 mmol/L  CBC     Status: Abnormal   Collection Time    09/24/13  9:48 PM      Result Value Range   WBC 18.5 (*) 4.0 - 10.5 K/uL   RBC 2.90 (*) 4.22 - 5.81 MIL/uL   Hemoglobin 9.3 (*) 13.0 - 17.0 g/dL   HCT 16.1 (*) 09.6 - 04.5 %   MCV 95.2  78.0 - 100.0 fL   MCH 32.1  26.0 - 34.0 pg   MCHC 33.7  30.0 - 36.0 g/dL   RDW 40.9  81.1 - 91.4 %   Platelets 264  150 - 400 K/uL  POCT I-STAT 3, BLOOD GAS (G3+)     Status: Abnormal   Collection Time    09/24/13 10:09 PM      Result Value Range   pH, Arterial 7.532 (*) 7.350 - 7.450   pCO2 arterial 31.3 (*) 35.0 - 45.0 mmHg   pO2, Arterial 96.0  80.0 - 100.0 mmHg   Bicarbonate 26.3 (*) 20.0 - 24.0 mEq/L   TCO2 27  0 - 100 mmol/L   O2 Saturation 98.0     Acid-Base Excess 4.0 (*) 0.0 - 2.0 mmol/L   Patient temperature 98.6 F     Collection site RADIAL, ALLEN'S TEST ACCEPTABLE     Drawn by RT     Sample type ARTERIAL    GLUCOSE, CAPILLARY     Status: Abnormal   Collection Time    09/24/13 11:14 PM      Result Value Range   Glucose-Capillary 143 (*) 70 - 99 mg/dL   Comment 1 Notify RN    CBC     Status: Abnormal    Collection Time    09/25/13 12:05 AM      Result Value Range   WBC 17.5 (*) 4.0 - 10.5 K/uL   RBC 3.01 (*) 4.22 - 5.81 MIL/uL   Hemoglobin 9.3 (*) 13.0 - 17.0 g/dL   HCT 78.2 (*) 95.6 - 21.3 %   MCV 92.7  78.0 - 100.0 fL   MCH 30.9  26.0 - 34.0 pg   MCHC 33.3  30.0 - 36.0 g/dL   RDW 08.6 (*) 57.8 - 46.9 %   Platelets 253  150 - 400 K/uL  PREPARE RBC (CROSSMATCH)     Status: None   Collection Time    09/25/13 12:30 AM      Result Value Range   Order Confirmation ORDER PROCESSED BY BLOOD BANK    PROTIME-INR     Status: Abnormal   Collection Time    09/25/13 12:30 AM      Result Value Range   Prothrombin Time 16.0 (*) 11.6 - 15.2 seconds   INR 1.31  0.00 - 1.49  APTT     Status: None   Collection Time    09/25/13 12:30 AM      Result Value Range   aPTT 33  24 - 37 seconds  MAGNESIUM     Status: None   Collection Time    09/25/13  1:55 AM      Result Value Range   Magnesium 1.9  1.5 - 2.5 mg/dL  BASIC METABOLIC PANEL     Status: Abnormal   Collection Time    09/25/13  2:26 AM      Result Value Range   Sodium 142  135 - 145 mEq/L   Potassium 3.3 (*) 3.5 - 5.1 mEq/L   Comment: DELTA CHECK NOTED   Chloride 110  96 - 112 mEq/L   CO2 23  19 - 32 mEq/L   Glucose, Bld 141 (*) 70 - 99 mg/dL   BUN 49 (*) 6 - 23 mg/dL   Creatinine, Ser 1.61  0.50 - 1.35 mg/dL   Calcium 7.2 (*) 8.4 - 10.5 mg/dL   GFR calc non Af Amer 46 (*) >90 mL/min   GFR calc Af Amer 54 (*) >90 mL/min   Comment: (NOTE)     The eGFR has been calculated using the CKD EPI equation.     This calculation has not been validated in all clinical situations.     eGFR's persistently <90 mL/min signify possible Chronic Kidney     Disease.  CBC     Status: Abnormal   Collection Time    09/25/13  2:26 AM      Result Value Range   WBC 15.8 (*) 4.0 - 10.5 K/uL   RBC 3.11 (*) 4.22 - 5.81 MIL/uL   Hemoglobin 9.5 (*) 13.0 - 17.0 g/dL   HCT 09.6 (*) 04.5 - 40.9 %   MCV 90.7  78.0 - 100.0 fL   MCH 30.5  26.0 - 34.0 pg    MCHC 33.7  30.0 - 36.0 g/dL   RDW 81.1 (*) 91.4 - 78.2 %   Platelets 225  150 - 400 K/uL  AMYLASE     Status: Abnormal   Collection Time    09/25/13  2:26 AM      Result Value Range   Amylase 730 (*) 0 - 105 U/L  LIPASE, BLOOD     Status: Abnormal   Collection Time    09/25/13  2:26 AM      Result Value Range   Lipase >3000 (*) 11 - 59 U/L  GLUCOSE, CAPILLARY     Status: Abnormal   Collection Time    09/25/13  2:57 AM      Result Value Range   Glucose-Capillary 127 (*) 70 - 99 mg/dL   Comment 1 Notify RN    POCT I-STAT 3, BLOOD GAS (G3+)     Status: Abnormal   Collection Time    09/25/13  3:10 AM      Result Value Range   pH, Arterial 7.499 (*) 7.350 - 7.450   pCO2 arterial 30.4 (*) 35.0 - 45.0 mmHg   pO2, Arterial 94.0  80.0 - 100.0 mmHg   Bicarbonate 23.8  20.0 - 24.0 mEq/L   TCO2 25  0 - 100 mmol/L   O2 Saturation 98.0     Acid-Base Excess 1.0  0.0 - 2.0 mmol/L   Patient temperature 97.8 F     Collection site ARTERIAL LINE     Drawn by RT     Sample type ARTERIAL    GLUCOSE, CAPILLARY     Status: Abnormal   Collection Time    09/25/13  8:02 AM      Result Value Range   Glucose-Capillary 136 (*) 70 - 99 mg/dL  CBC     Status: Abnormal   Collection Time  09/25/13  8:26 AM      Result Value Range   WBC 16.8 (*) 4.0 - 10.5 K/uL   RBC 3.28 (*) 4.22 - 5.81 MIL/uL   Hemoglobin 10.0 (*) 13.0 - 17.0 g/dL   HCT 45.4 (*) 09.8 - 11.9 %   MCV 89.6  78.0 - 100.0 fL   MCH 30.5  26.0 - 34.0 pg   MCHC 34.0  30.0 - 36.0 g/dL   RDW 14.7 (*) 82.9 - 56.2 %   Platelets 234  150 - 400 K/uL  GLUCOSE, CAPILLARY     Status: Abnormal   Collection Time    09/25/13 11:15 AM      Result Value Range   Glucose-Capillary 136 (*) 70 - 99 mg/dL  CBC     Status: Abnormal   Collection Time    09/25/13  2:26 PM      Result Value Range   WBC 15.3 (*) 4.0 - 10.5 K/uL   RBC 3.10 (*) 4.22 - 5.81 MIL/uL   Hemoglobin 9.4 (*) 13.0 - 17.0 g/dL   HCT 13.0 (*) 86.5 - 78.4 %   MCV 89.4  78.0  - 100.0 fL   MCH 30.3  26.0 - 34.0 pg   MCHC 33.9  30.0 - 36.0 g/dL   RDW 69.6 (*) 29.5 - 28.4 %   Platelets 232  150 - 400 K/uL  GLUCOSE, CAPILLARY     Status: Abnormal   Collection Time    09/25/13  4:30 PM      Result Value Range   Glucose-Capillary 61 (*) 70 - 99 mg/dL  GLUCOSE, CAPILLARY     Status: None   Collection Time    09/25/13  5:26 PM      Result Value Range   Glucose-Capillary 80  70 - 99 mg/dL  GLUCOSE, CAPILLARY     Status: None   Collection Time    09/25/13  7:29 PM      Result Value Range   Glucose-Capillary 89  70 - 99 mg/dL  GLUCOSE, CAPILLARY     Status: Abnormal   Collection Time    09/25/13 11:37 PM      Result Value Range   Glucose-Capillary 117 (*) 70 - 99 mg/dL   Comment 1 Documented in Chart     Comment 2 Notify RN    CBC     Status: Abnormal   Collection Time    09/26/13  2:26 AM      Result Value Range   WBC 13.1 (*) 4.0 - 10.5 K/uL   RBC 2.74 (*) 4.22 - 5.81 MIL/uL   Hemoglobin 8.4 (*) 13.0 - 17.0 g/dL   HCT 13.2 (*) 44.0 - 10.2 %   MCV 90.9  78.0 - 100.0 fL   MCH 30.7  26.0 - 34.0 pg   MCHC 33.7  30.0 - 36.0 g/dL   RDW 72.5 (*) 36.6 - 44.0 %   Platelets 210  150 - 400 K/uL  GLUCOSE, CAPILLARY     Status: None   Collection Time    09/26/13  4:11 AM      Result Value Range   Glucose-Capillary 91  70 - 99 mg/dL  BASIC METABOLIC PANEL     Status: Abnormal   Collection Time    09/26/13  5:00 AM      Result Value Range   Sodium 145  135 - 145 mEq/L   Potassium 3.6  3.5 - 5.1 mEq/L   Chloride 115 (*) 96 -  112 mEq/L   CO2 24  19 - 32 mEq/L   Glucose, Bld 104 (*) 70 - 99 mg/dL   BUN 48 (*) 6 - 23 mg/dL   Creatinine, Ser 7.82 (*) 0.50 - 1.35 mg/dL   Calcium 7.2 (*) 8.4 - 10.5 mg/dL   GFR calc non Af Amer 43 (*) >90 mL/min   GFR calc Af Amer 50 (*) >90 mL/min   Comment: (NOTE)     The eGFR has been calculated using the CKD EPI equation.     This calculation has not been validated in all clinical situations.     eGFR's persistently <90  mL/min signify possible Chronic Kidney     Disease.  GLUCOSE, CAPILLARY     Status: None   Collection Time    09/26/13  7:00 AM      Result Value Range   Glucose-Capillary 86  70 - 99 mg/dL  PREPARE RBC (CROSSMATCH)     Status: None   Collection Time    09/26/13  7:24 AM      Result Value Range   Order Confirmation ORDER PROCESSED BY BLOOD BANK    GLUCOSE, CAPILLARY     Status: Abnormal   Collection Time    09/26/13 10:54 AM      Result Value Range   Glucose-Capillary 104 (*) 70 - 99 mg/dL   Dg Chest Port 1 View  09/25/2013   CLINICAL DATA:  Central line placement.  EXAM: PORTABLE CHEST - 1 VIEW  COMPARISON:  Chest radiograph performed 09/23/2013  FINDINGS: The patient's right IJ line is noted ending about the mid to distal SVC. The enteric tube is noted extending below the diaphragm.  The lungs are hypoexpanded. Mild bibasilar atelectasis is noted. No pleural effusion or pneumothorax is seen.  The cardiomediastinal silhouette is normal in size. No acute osseous abnormalities are identified.  IMPRESSION: 1. Right IJ line noted ending about the mid to distal SVC. 2. Lungs hypoexpanded, with mild bibasilar atelectasis.   Electronically Signed   By: Roanna Raider M.D.   On: 09/25/2013 00:17   Dg Abd Portable 1v  09/26/2013   CLINICAL DATA:  Abdominal pain  EXAM: PORTABLE ABDOMEN - 1 VIEW  COMPARISON:  Abdominal films of September 25, 2013.  FINDINGS: The bowel gas pattern does not suggest obstruction or ileus. No abnormal extraluminal gas collections are demonstrated. Portions of the left aspect of the abdomen are excluded from the field-of-view. There is radiodense material in the region of the urinary bladder likely from the previous CT scan. There are surgical clips overlying the right and left L2 transverse processes.  IMPRESSION: There is no evidence of bowel obstruction or ileus. A three-way abdominal series may be useful if further plain film imaging is desired.   Electronically Signed    By: David  [place]   On: 09/26/2013 11:05   Dg Abd Portable 1v  09/25/2013   CLINICAL DATA:  Gastric bleeding. Concern for small bowel obstruction.  EXAM: PORTABLE ABDOMEN - 1 VIEW  COMPARISON:  CT of the abdomen and pelvis performed 09/23/2013  FINDINGS: The visualized bowel gas pattern is grossly unremarkable. There is no evidence of bowel obstruction. Known active bleeding at the duodenum is not characterized on radiograph. The patient's enteric tube is noted overlying the body of the stomach. No free intra-abdominal air is identified, though evaluation for free air is suboptimal on supine views.  No acute osseous abnormalities are seen. Minimal degenerative change is noted along the lumbar spine.  The patient's right hip prosthesis is grossly unremarkable in appearance, though incompletely imaged. Contrast is seen filling the bladder.  IMPRESSION: Unremarkable bowel gas pattern; no free intra-abdominal air seen.   Electronically Signed   By: Roanna Raider M.D.   On: 09/25/2013 02:33   Ct Angio Abd/pel W/ And/or W/o  09/25/2013   CLINICAL DATA:  Upper GI bleed; bright red blood from nasogastric tube.  EXAM: CT ANGIOGRAPHY ABDOMEN AND PELVIS WITH CONTRAST AND WITHOUT CONTRAST  TECHNIQUE: Multidetector CT imaging of the abdomen and pelvis was performed using the standard protocol during bolus administration of intravenous contrast. Multiplanar reconstructed images including MIPs were obtained and reviewed to evaluate the vascular anatomy.  CONTRAST:  OMNIPAQUE IOHEXOL 350 MG/ML SOLN  COMPARISON:  CT of the abdomen and pelvis performed 09/23/2013  FINDINGS: Trace bilateral pleural effusions are seen, left greater than right, with bibasilar airspace opacification likely reflecting atelectasis.  The known focus of acute duodenal hemorrhage is better characterized on the prior study, as arterial phase images remain too early to visualize active contrast extravasation at the duodenum. As before, there is  apparent wall thickening at the second segment of the duodenum; this may reflect an underlying mass or a bleeding duodenal ulcer and associated duodenitis. Mildly heterogeneous blood is again seen distending the duodenum, with the duodenum measuring up to 5.0 cm in diameter. This appearance is grossly unchanged from the prior study.  The patient's enteric tube is seen ending at the body of the stomach. The stomach is largely decompressed, aside from a small amount of high attenuation fluid in the antrum, likely reflecting blood tracking from the duodenum.  The nodular contour of the liver is compatible with cirrhotic change. Minimal residual contrast is noted at the Phrygian cap of the gallbladder. The spleen is diminutive, with a few calcified granulomata. There is mild diffuse soft tissue stranding about the pancreas, more prominent than on the prior study. This may reflect the duodenal process, though would correlate with lipase levels. Peripancreatic nodes remain stable in size. The adrenal glands are unremarkable in appearance.  Mild calcification is noted along the abdominal aorta and its branches. There is slight ectasia of the distal abdominal aorta, without evidence of aneurysmal dilatation. The celiac trunk, superior mesenteric artery, bilateral renal arteries and inferior mesenteric artery remain patent. The inferior vena cava is grossly unremarkable in appearance.  Nonspecific perinephric stranding is noted bilaterally. Scarring is noted near the upper pole of the left kidney. Small bilateral renal cysts are seen. The kidneys are otherwise unremarkable. There is no evidence of hydronephrosis. No renal or ureteral stones are seen.  No free fluid is identified. The small bowel is unremarkable in appearance. The stomach is within normal limits. No acute vascular abnormalities are seen.  The appendix is grossly unremarkable in appearance, without evidence for appendicitis. Scattered diverticulosis is noted  along the entirety of the colon, without evidence of diverticulitis.  The bladder is mildly distended and grossly unremarkable in appearance. Mildly increased attenuation within the bladder may reflect residual contrast from the prior study. The prostate remains normal in size, with scattered calcification. No inguinal lymphadenopathy is seen.  No acute osseous abnormalities are identified. The patient's right hip prosthesis is incompletely imaged but appears grossly unremarkable.  Review of the MIP images confirms the above findings.  IMPRESSION: 1. Known focus of acute duodenal hemorrhage is better characterized on the prior study, as arterial phase images remain too early to visualize active contrast extravasation at the duodenum.  As before, there is apparent wall thickening of the second segment of the duodenum. This may reflect an underlying mass or bleeding duodenal ulcer, and associated duodenitis. Mildly heterogeneous blood again seen distending the duodenum and tracking back into the gastric antrum. The duodenum measures up to 5.0 cm in diameter, relatively stable from the prior study. 2. Mildly increased diffuse soft tissue stranding about the pancreas. This may reflect the duodenal process, though would correlate with lipase levels to exclude mild pancreatitis. 3. Trace bilateral pleural effusions, left greater than right, with bibasilar airspace opacification likely reflecting atelectasis. 4. Changes of hepatic cirrhosis noted. 5. Mild calcification along the abdominal aorta and its branches. 6. Small bilateral renal cysts; scarring near the upper pole of the left kidney. 7. Scattered diverticulosis along the entirety of the colon, without evidence of diverticulitis.   Electronically Signed   By: Roanna Raider M.D.   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  S/p 2 EGD's, no active bleeding seen on mesenteric angiogram 2.  Had a dark bloody stool this am with Hgb drifting down 3.  Due to the location of the bleeding it may be very difficult to complete a surgery and thus we would like to treat conservatively if at all possible.  Serial H/H's. 4.  If he re-bleeds would re-attempt angio prior to surgical intervention due to the complexity of the surgery 5.  He is tolerating a liquid diet and appears much more stable than previously  6.  Continue PPI's, pain control, IVF, antiemetics 7.  Would not advance diet until he is more stable 8.  Will follow   DORT, Cochise Dinneen 09/26/2013, 1:41 PM Pager: [phone]

## 2013-09-26 NOTE — Progress Notes (Signed)
Subjective: Patient is without complaints.   Objective: Physical Exam: BP 117/50  Pulse 80  Temp(Src) 98 F (36.7 C) (Oral)  Resp 17  Ht 5\' 9"  (1.753 m)  Wt 175 lb 0.7 oz (79.4 kg)  BMI 25.84 kg/m2  SpO2 100%  General: Alert and oriented, NAD Abd: Soft, (+) BS, mild tenderness Ext: Right groin site dressing C/D/I, no signs of bleeding or hematoma. DP intact   Labs: CBC  Recent Labs  09/25/13 1426 09/26/13 0226  WBC 15.3* 13.1*  HGB 9.4* 8.4*  HCT 27.7* 24.9*  PLT 232 210   BMET  Recent Labs  09/25/13 0226 09/26/13 0500  NA 142 145  K 3.3* 3.6  CL 110 115*  CO2 23 24  GLUCOSE 141* 104*  BUN 49* 48*  CREATININE 1.33 1.42*  CALCIUM 7.2* 7.2*   LFT  Recent Labs  09/23/13 1154 09/25/13 0226  PROT 7.2  --   ALBUMIN 3.9  --   AST 25  --   ALT 17  --   ALKPHOS 39  --   BILITOT 0.7  --   LIPASE  --  >3000*   PT/INR  Recent Labs  09/23/13 1154 09/25/13 0030  LABPROT 13.0 16.0*  INR 1.00 1.31     Studies/Results: Dg Chest Port 1 View  09/25/2013   CLINICAL DATA:  Central line placement.  EXAM: PORTABLE CHEST - 1 VIEW  COMPARISON:  Chest radiograph performed 09/23/2013  FINDINGS: The patient's right IJ line is noted ending about the mid to distal SVC. The enteric tube is noted extending below the diaphragm.  The lungs are hypoexpanded. Mild bibasilar atelectasis is noted. No pleural effusion or pneumothorax is seen.  The cardiomediastinal silhouette is normal in size. No acute osseous abnormalities are identified.  IMPRESSION: 1. Right IJ line noted ending about the mid to distal SVC. 2. Lungs hypoexpanded, with mild bibasilar atelectasis.   Electronically Signed   By: Roanna Raider M.D.   On: 09/25/2013 00:17   Dg Abd Portable 1v  09/25/2013   CLINICAL DATA:  Gastric bleeding. Concern for small bowel obstruction.  EXAM: PORTABLE ABDOMEN - 1 VIEW  COMPARISON:  CT of the abdomen and pelvis performed 09/23/2013  FINDINGS: The visualized bowel gas  pattern is grossly unremarkable. There is no evidence of bowel obstruction. Known active bleeding at the duodenum is not characterized on radiograph. The patient's enteric tube is noted overlying the body of the stomach. No free intra-abdominal air is identified, though evaluation for free air is suboptimal on supine views.  No acute osseous abnormalities are seen. Minimal degenerative change is noted along the lumbar spine. The patient's right hip prosthesis is grossly unremarkable in appearance, though incompletely imaged. Contrast is seen filling the bladder.  IMPRESSION: Unremarkable bowel gas pattern; no free intra-abdominal air seen.   Electronically Signed   By: Roanna Raider M.D.   On: 09/25/2013 02:33   Ct Angio Abd/pel W/ And/or W/o  09/25/2013   CLINICAL DATA:  Upper GI bleed; bright red blood from nasogastric tube.  EXAM: CT ANGIOGRAPHY ABDOMEN AND PELVIS WITH CONTRAST AND WITHOUT CONTRAST  TECHNIQUE: Multidetector CT imaging of the abdomen and pelvis was performed using the standard protocol during bolus administration of intravenous contrast. Multiplanar reconstructed images including MIPs were obtained and reviewed to evaluate the vascular anatomy.  CONTRAST:  OMNIPAQUE IOHEXOL 350 MG/ML SOLN  COMPARISON:  CT of the abdomen and pelvis performed 09/23/2013  FINDINGS: Trace bilateral pleural effusions are seen, left  greater than right, with bibasilar airspace opacification likely reflecting atelectasis.  The known focus of acute duodenal hemorrhage is better characterized on the prior study, as arterial phase images remain too early to visualize active contrast extravasation at the duodenum. As before, there is apparent wall thickening at the second segment of the duodenum; this may reflect an underlying mass or a bleeding duodenal ulcer and associated duodenitis. Mildly heterogeneous blood is again seen distending the duodenum, with the duodenum measuring up to 5.0 cm in diameter. This  appearance is grossly unchanged from the prior study.  The patient's enteric tube is seen ending at the body of the stomach. The stomach is largely decompressed, aside from a small amount of high attenuation fluid in the antrum, likely reflecting blood tracking from the duodenum.  The nodular contour of the liver is compatible with cirrhotic change. Minimal residual contrast is noted at the Phrygian cap of the gallbladder. The spleen is diminutive, with a few calcified granulomata. There is mild diffuse soft tissue stranding about the pancreas, more prominent than on the prior study. This may reflect the duodenal process, though would correlate with lipase levels. Peripancreatic nodes remain stable in size. The adrenal glands are unremarkable in appearance.  Mild calcification is noted along the abdominal aorta and its branches. There is slight ectasia of the distal abdominal aorta, without evidence of aneurysmal dilatation. The celiac trunk, superior mesenteric artery, bilateral renal arteries and inferior mesenteric artery remain patent. The inferior vena cava is grossly unremarkable in appearance.  Nonspecific perinephric stranding is noted bilaterally. Scarring is noted near the upper pole of the left kidney. Small bilateral renal cysts are seen. The kidneys are otherwise unremarkable. There is no evidence of hydronephrosis. No renal or ureteral stones are seen.  No free fluid is identified. The small bowel is unremarkable in appearance. The stomach is within normal limits. No acute vascular abnormalities are seen.  The appendix is grossly unremarkable in appearance, without evidence for appendicitis. Scattered diverticulosis is noted along the entirety of the colon, without evidence of diverticulitis.  The bladder is mildly distended and grossly unremarkable in appearance. Mildly increased attenuation within the bladder may reflect residual contrast from the prior study. The prostate remains normal in size,  with scattered calcification. No inguinal lymphadenopathy is seen.  No acute osseous abnormalities are identified. The patient's right hip prosthesis is incompletely imaged but appears grossly unremarkable.  Review of the MIP images confirms the above findings.  IMPRESSION: 1. Known focus of acute duodenal hemorrhage is better characterized on the prior study, as arterial phase images remain too early to visualize active contrast extravasation at the duodenum. As before, there is apparent wall thickening of the second segment of the duodenum. This may reflect an underlying mass or bleeding duodenal ulcer, and associated duodenitis. Mildly heterogeneous blood again seen distending the duodenum and tracking back into the gastric antrum. The duodenum measures up to 5.0 cm in diameter, relatively stable from the prior study. 2. Mildly increased diffuse soft tissue stranding about the pancreas. This may reflect the duodenal process, though would correlate with lipase levels to exclude mild pancreatitis. 3. Trace bilateral pleural effusions, left greater than right, with bibasilar airspace opacification likely reflecting atelectasis. 4. Changes of hepatic cirrhosis noted. 5. Mild calcification along the abdominal aorta and its branches. 6. Small bilateral renal cysts; scarring near the upper pole of the left kidney. 7. Scattered diverticulosis along the entirety of the colon, without evidence of diverticulitis.   Electronically Signed  By: Roanna Raider M.D.   On: 09/25/2013 03:59    Assessment/Plan: UGI bleed secondary to duodenal ulcer s/p GI clipping s/p mesenteric angiogram with no active bleeding seen. Hgb dropped 8.4 (9.4), receiving blood transfusion now, BM with dark blood seen. BP stable.    LOS: 3 days    Cloretta Ned 09/26/2013 10:28 AM

## 2013-09-26 NOTE — Progress Notes (Signed)
Called eLink to notify the nurse and Doctor that the patient's hemoglobin is now 8.4.

## 2013-09-27 LAB — CBC
HCT: 27.1 % — ABNORMAL LOW (ref 39.0–52.0)
Hemoglobin: 9.2 g/dL — ABNORMAL LOW (ref 13.0–17.0)
MCH: 30.8 pg (ref 26.0–34.0)
MCHC: 33.9 g/dL (ref 30.0–36.0)
MCV: 90.6 fL (ref 78.0–100.0)
Platelets: 213 10*3/uL (ref 150–400)
RBC: 2.99 MIL/uL — ABNORMAL LOW (ref 4.22–5.81)
RDW: 17 % — ABNORMAL HIGH (ref 11.5–15.5)
WBC: 10.1 10*3/uL (ref 4.0–10.5)

## 2013-09-27 LAB — BASIC METABOLIC PANEL
BUN: 32 mg/dL — ABNORMAL HIGH (ref 6–23)
CO2: 21 mEq/L (ref 19–32)
Calcium: 7.6 mg/dL — ABNORMAL LOW (ref 8.4–10.5)
Chloride: 110 mEq/L (ref 96–112)
Creatinine, Ser: 1.15 mg/dL (ref 0.50–1.35)
GFR calc Af Amer: 64 mL/min — ABNORMAL LOW (ref >90)
GFR calc non Af Amer: 55 mL/min — ABNORMAL LOW (ref >90)
Glucose, Bld: 99 mg/dL (ref 70–99)
Potassium: 3.5 mEq/L (ref 3.5–5.1)
Sodium: 140 mEq/L (ref 135–145)

## 2013-09-27 LAB — TYPE AND SCREEN
ABO/RH(D): A POS
Antibody Screen: NEGATIVE
Unit division: 0
Unit division: 0
Unit division: 0
Unit division: 0
Unit division: 0
Unit division: 0
Unit division: 0

## 2013-09-27 LAB — MAGNESIUM: Magnesium: 2.3 mg/dL (ref 1.5–2.5)

## 2013-09-27 LAB — GLUCOSE, CAPILLARY
Glucose-Capillary: 115 mg/dL — ABNORMAL HIGH (ref 70–99)
Glucose-Capillary: 96 mg/dL (ref 70–99)
Glucose-Capillary: 88 mg/dL (ref 70–99)
Glucose-Capillary: 108 mg/dL — ABNORMAL HIGH (ref 70–99)
Glucose-Capillary: 102 mg/dL — ABNORMAL HIGH (ref 70–99)
Glucose-Capillary: 115 mg/dL — ABNORMAL HIGH (ref 70–99)

## 2013-09-27 LAB — LIPASE, BLOOD: Lipase: 127 U/L — ABNORMAL HIGH (ref 11–59)

## 2013-09-27 MED ORDER — METOCLOPRAMIDE HCL 5 MG/ML IJ SOLN
10.0000 mg | Freq: Four times a day (QID) | INTRAMUSCULAR | Status: DC
  Administered 2013-09-27 – 2013-10-04 (×27): 10 mg via INTRAVENOUS
  Filled 2013-09-27 (×38): qty 2

## 2013-09-27 MED ORDER — ONDANSETRON HCL 4 MG/2ML IJ SOLN
4.0000 mg | Freq: Four times a day (QID) | INTRAMUSCULAR | Status: DC | PRN
  Filled 2013-09-27: qty 2

## 2013-09-27 MED ORDER — POTASSIUM CHLORIDE 10 MEQ/50ML IV SOLN
10.0000 meq | INTRAVENOUS | Status: AC
  Administered 2013-09-27 (×2): 10 meq via INTRAVENOUS
  Filled 2013-09-27: qty 50

## 2013-09-27 MED ORDER — ONDANSETRON HCL 4 MG/2ML IJ SOLN
4.0000 mg | Freq: Three times a day (TID) | INTRAMUSCULAR | Status: DC | PRN
  Administered 2013-09-27: 4 mg via INTRAVENOUS

## 2013-09-27 NOTE — Progress Notes (Signed)
2 Days Post-Op  Subjective: No abdominal pain  Objective: Vital signs in last 24 hours: Temp:  [97.7 F (36.5 C)-98.6 F (37 C)] 98 F (36.7 C) (12/28 0745) Pulse Rate:  [60-91] 80 (12/28 0900) Resp:  [9-26] 19 (12/28 0900) BP: (107-152)/(46-78) 152/64 mmHg (12/27 1900) SpO2:  [92 %-100 %] 98 % (12/28 0900) Arterial Line BP: (64-186)/(49-139) 174/75 mmHg (12/28 0900) Weight:  [177 lb 4 oz (80.4 kg)] 177 lb 4 oz (80.4 kg) (12/28 0432) Last BM Date: 09/26/13  Intake/Output from previous day: 12/27 0701 - 12/28 0700 In: 5235.8 [I.V.:3070.8; Blood:365; IV Piggyback:1800] Out: 428 [Urine:425; Stool:3] Intake/Output this shift: Total I/O In: 333.4 [I.V.:233.4; IV Piggyback:100] Out: 300 [Urine:300]  General appearance: alert and no distress Resp: clear to auscultation bilaterally Cardio: regular rate and rhythm GI: soft, NT, +BS  Lab Results:   Recent Labs  09/26/13 2026 09/27/13 0215  WBC 11.0* 10.1  HGB 9.2* 9.2*  HCT 27.7* 27.1*  PLT 224 213   BMET  Recent Labs  09/26/13 0500 09/27/13 0215  NA 145 140  K 3.6 3.5  CL 115* 110  CO2 24 21  GLUCOSE 104* 99  BUN 48* 32*  CREATININE 1.42* 1.15  CALCIUM 7.2* 7.6*   PT/INR  Recent Labs  09/25/13 0030 09/26/13 1345  LABPROT 16.0* 16.5*  INR 1.31 1.37   ABG  Recent Labs  09/24/13 2209 09/25/13 0310  PHART 7.532* 7.499*  HCO3 26.3* 23.8    Studies/Results: Dg Abd Portable 1v  09/26/2013   CLINICAL DATA:  Abdominal pain  EXAM: PORTABLE ABDOMEN - 1 VIEW  COMPARISON:  Abdominal films of September 25, 2013.  FINDINGS: The bowel gas pattern does not suggest obstruction or ileus. No abnormal extraluminal gas collections are demonstrated. Portions of the left aspect of the abdomen are excluded from the field-of-view. There is radiodense material in the region of the urinary bladder likely from the previous CT scan. There are surgical clips overlying the right and left L2 transverse processes.  IMPRESSION:  There is no evidence of bowel obstruction or ileus. A three-way abdominal series may be useful if further plain film imaging is desired.   Electronically Signed   By: David  [place]   On: 09/26/2013 11:05    Anti-infectives: Anti-infectives   Start     Dose/Rate Route Frequency Ordered Stop   09/24/13 1200  metroNIDAZOLE (FLAGYL) IVPB 500 mg  Status:  Discontinued     500 mg 100 mL/hr over 60 Minutes Intravenous Every 8 hours 09/24/13 0753 09/27/13 0917   09/24/13 1000  ciprofloxacin (CIPRO) IVPB 400 mg  Status:  Discontinued     400 mg 200 mL/hr over 60 Minutes Intravenous Every 12 hours 09/24/13 0018 09/24/13 0744   09/24/13 1000  ciprofloxacin (CIPRO) IVPB 400 mg  Status:  Discontinued     400 mg 200 mL/hr over 60 Minutes Intravenous Every 12 hours 09/24/13 0748 09/27/13 0917   09/24/13 0800  metroNIDAZOLE (FLAGYL) IVPB 500 mg  Status:  Discontinued     500 mg 100 mL/hr over 60 Minutes Intravenous Every 8 hours 09/24/13 0747 09/24/13 0753   09/24/13 0800  metroNIDAZOLE (FLAGYL) IVPB 500 mg  Status:  Discontinued     500 mg 100 mL/hr over 60 Minutes Intravenous Every 8 hours 09/24/13 0748 09/24/13 0748   09/24/13 0600  metroNIDAZOLE (FLAGYL) IVPB 500 mg  Status:  Discontinued     500 mg 100 mL/hr over 60 Minutes Intravenous Every 8 hours 09/24/13 0014 09/24/13 0744  09/23/13 2100  ciprofloxacin (CIPRO) IVPB 400 mg     400 mg 200 mL/hr over 60 Minutes Intravenous  Once 09/23/13 2049 09/23/13 2337   09/23/13 2100  metroNIDAZOLE (FLAGYL) IVPB 500 mg     500 mg 100 mL/hr over 60 Minutes Intravenous  Once 09/23/13 2049 09/23/13 2336      Assessment/Plan: s/p Procedure(s): ESOPHAGOGASTRODUODENOSCOPY (EGD) (N/A) UGIB due to ulcers including 3-4th portion of duodenum - seems to have stopped. Would be a difficult surgical appraoch. We will follow.  LOS: 4 days    Lottie Siska E 09/27/2013

## 2013-09-27 NOTE — Progress Notes (Addendum)
PULMONARY / CRITICAL CARE MEDICINE  Name: [name] MRN: [id number] DOB: Jun 04, 1924    ADMISSION DATE:  09/23/2013 CONSULTATION DATE:  09/23/2013  REFERRING MD :  EDP PRIMARY SERVICE: PCCM  CHIEF COMPLAINT:  Upper GII bleeding  BRIEF PATIENT DESCRIPTION: 79 yo with upper GI hemorrhage from multiple duodenal ulcers.  SIGNIFICANT EVENTS / STUDIES:  12/24  CT abdomen 12/24 >>> Contrast extravasation within the distal duodenum. Gastric outlet obstruction with marked distention of the stomach and proximal duodenum. Mild hepatic cirrhosis. 12/24  US abdomen >>> Liver echogenicity  Increased ? Fatty liver 12/24  EGD >>> Severe esophagitis, excessive duodenal ulceration, suspected ischemic duodenitis  12/25  New AF-RVR / hypotension >>> Cardizem x 1 + Amiodarone bolus and gtt. CVL placed 12/25  CTA abdomen >>> Thickening of the second segment of the duodenum. Mildly heterogeneous blood again  seen distending the duodenum and tracking back into the gastric antrum. The duodenum measures up to 5.0 cm in diameter, relatively stable from the prior study. Mildly increased diffuse soft tissue stranding about the pancreas. This may reflect the duodenal process, though would correlate with lipase levels to exclude mild pancreatitis. Trace bilateral pleural effusions, left greater than right, with bibasilar airspace opacification likely reflecting atelectasis. Changes of hepatic cirrhosis noted. Mild calcification along the abdominal aorta and its branches. Small bilateral renal cysts; scarring near the upper pole of the left kidney. Scattered diverticulosis along the entirety of the colon, without evidence of diverticulitis. 12/26  EGD >>> Esophagitis, massive duodenal visible vessel in setting of an ulcer ( clipped ), shallow duodenal ulcers 12/26  IR >>> No source of bleeding identified 12/27  Seen by Surgery >>> Conservative management, available if other treatments fail  LINES / TUBES: NGT 12/24 >>>  12/26 R IJ CVL 12/25 >>> L ard A-line 12/26 >>> 12/27  CULTURES:  ANTIBIOTICS: Ciprofloxacin 12/24 >>> Flagyl 12/24 >>>  INTERVAL HISTORY:  Stable hemoglobin. No episodes of hypotension. Off vasopressors. Controlled rate. Tolerating clear liquids.  VITAL SIGNS: Temp:  [97.7 F (36.5 C)-98.6 F (37 C)] 97.7 F (36.5 C) (12/28 0430) Pulse Rate:  [60-91] 76 (12/28 0600) Resp:  [9-25] 19 (12/28 0600) BP: (102-152)/(38-95) 152/64 mmHg (12/27 1900) SpO2:  [92 %-100 %] 96 % (12/28 0600) Arterial Line BP: (61-168)/(41-139) 165/60 mmHg (12/28 0600) Weight:  [80.4 kg (177 lb 4 oz)] 80.4 kg (177 lb 4 oz) (12/28 0432)  HEMODYNAMICS: CVP:  [5 mmHg] 5 mmHg  VENTILATOR SETTINGS:   INTAKE / OUTPUT: Intake/Output     12/27 0701 - 12/28 0700 12/28 0701 - 12/29 0700   I.V. (mL/kg) 2954.1 (36.7)    Blood 365    IV Piggyback 1800    Total Intake(mL/kg) 5119.1 (63.7)    Urine (mL/kg/hr) 425 (0.2)    Emesis/NG output     Stool 3 (0)    Total Output 428     Net +4691.1          Urine Occurrence 1 x    Stool Occurrence 1 x     PHYSICAL EXAMINATION: General:  Resting comfortably Neuro:  Awake, alert, cooperative HEENT:  PERRL, moist membranes Cardiovascular:  Irregular, controlled rate Lungs:  Clear Abdomen:  Soft, non tender, bowel sounds present Musculoskeletal:  Moves all extremities, no edema Skin:  Intact  LABS:  CBC  Recent Labs Lab 09/26/13 1409 09/26/13 2026 09/27/13 0215  WBC 11.1* 11.0* 10.1  HGB 9.5* 9.2* 9.2*  HCT 27.3* 27.7* 27.1*  PLT 201 224 213  Coag's  Recent Labs Lab 09/23/13 1154 09/25/13 0030 09/26/13 1345  APTT  --  33  --   INR 1.00 1.31 1.37   BMET  Recent Labs Lab 09/25/13 0226 09/26/13 0500 09/27/13 0215  NA 142 145 140  K 3.3* 3.6 3.5  CL 110 115* 110  CO2 23 24 21   BUN 49* 48* 32*  CREATININE 1.33 1.42* 1.15  GLUCOSE 141* 104* 99   Electrolytes  Recent Labs Lab 09/24/13 0415 09/25/13 0155 09/25/13 0226  09/26/13 0500 09/27/13 0215  CALCIUM 8.5  --  7.2* 7.2* 7.6*  MG 1.4* 1.9  --   --  2.3  PHOS 3.8  --   --   --   --    Sepsis Markers  Recent Labs Lab 09/24/13 1358 09/24/13 1945 09/24/13 2148  LATICACIDVEN 2.8* 2.0 2.2   ABG  Recent Labs Lab 09/24/13 2209 09/25/13 0310  PHART 7.532* 7.499*  PCO2ART 31.3* 30.4*  PO2ART 96.0 94.0   Liver Enzymes  Recent Labs Lab 09/23/13 1154  AST 25  ALT 17  ALKPHOS 39  BILITOT 0.7  ALBUMIN 3.9   Cardiac Enzymes  Recent Labs Lab 09/24/13 0135  TROPONINI <0.30   Glucose  Recent Labs Lab 09/26/13 0700 09/26/13 1054 09/26/13 1506 09/26/13 1910 09/27/13 0010 09/27/13 0342  GLUCAP 86 104* 135* 77 115* 96   CXR:  None today  ASSESSMENT / PLAN:  PULMONARY A: Mild hypoxia post sedation for EGD - resolved. At risk for aspiration. P:   Goal SpO2>92 Supplemental oxygen PRN May need arway  CARDIOVASCULAR A: New AF - RVR associated with hypotension, now normotensive and rate controlled. H/o HTN. P:  Goal MAP>65 Hold Lisinopril, ASA D/c Amiodarone gtt D/c Neo-Synephrine gtt  RENAL  A:  AKI - improving. Hypokalemia. CVP 6. P:   Trend BMP NS@100  K 10 x 2  GASTROINTESTINAL A:  Severe esophagitis. Multiple duodenal ulcers. Concern for mesenteric Ischemia, but lactate reassuring. Pancreatitis ( Amy 730, Lipase > 3000 ).  P:   Surgery following, available if other things fail GI following, clipped 12/26 but did not feel it was successful IR following, no active hemorrhage seen 12/26 Protonix bid H. Pylori Bx results Clear liquids  HEMATOLOGIC A:  Acute blood loss anemia. P:  Change CBC to q12h Goal Hb>10 as actively bleeding  INFECTIOUS A:  Concern for ischemic bowel. P:   Abx as above  ENDOCRINE A:  Diet controlled DM2. P:   SSI  NEUROLOGIC A:  No active issues. P:   No intervention required  I have personally obtained a history, examined the patient, evaluated laboratory and imaging  results, formulated the assessment and plan and placed orders.  Will discuss with GI this morning. May be appropriate for SDU. Advance diet?  Lonia Farber, MD Pulmonary and Critical Care Medicine [facility] Pager: [phone]  09/27/2013, 7:17 AM

## 2013-09-27 NOTE — Progress Notes (Signed)
C/o nausea earlier given zofran. Denies pain. Now vomitting small amount of coffee ground /bilious emesis twice. Dr Sherene Sires made aware. See orders

## 2013-09-27 NOTE — Progress Notes (Signed)
Daily Rounding Note  09/27/2013, 8:37 AM  LOS: 4 days   SUBJECTIVE:       Tolerating clears, had a large BM that was black/red.  Smear today less tarry.  No belly pin, nausea or dizziness.   OBJECTIVE:         Vital signs in last 24 hours:    Temp:  [97.7 F (36.5 C)-98.6 F (37 C)] 98 F (36.7 C) (12/28 0745) Pulse Rate:  [60-91] 72 (12/28 0800) Resp:  [9-26] 26 (12/28 0800) BP: (102-152)/(38-78) 152/64 mmHg (12/27 1900) SpO2:  [92 %-100 %] 95 % (12/28 0800) Arterial Line BP: (64-186)/(41-139) 186/68 mmHg (12/28 0800) Weight:  [80.4 kg (177 lb 4 oz)] 80.4 kg (177 lb 4 oz) (12/28 0432) Last BM Date: 09/26/13 General: Frail, alert .  Very pleasant and cooperative.  Heart: RRR Chest: clear, no cough or rales.   Abdomen: not tender or distended.  No tenderness.  Active BS  Extremities: no CCE Neuro/Psych:  Pleasant, alert and oriented.  No gross deficits.   Intake/Output from previous day: 12/27 0701 - 12/28 0700 In: 5119.1 [I.V.:2954.1; Blood:365; IV Piggyback:1800] Out: 428 [Urine:425; Stool:3]  Intake/Output this shift:    Lab Results:  Recent Labs  09/26/13 1409 09/26/13 2026 09/27/13 0215  WBC 11.1* 11.0* 10.1  HGB 9.5* 9.2* 9.2*  HCT 27.3* 27.7* 27.1*  PLT 201 224 213   BMET  Recent Labs  09/25/13 0226 09/26/13 0500 09/27/13 0215  NA 142 145 140  K 3.3* 3.6 3.5  CL 110 115* 110  CO2 23 24 21   GLUCOSE 141* 104* 99  BUN 49* 48* 32*  CREATININE 1.33 1.42* 1.15  CALCIUM 7.2* 7.2* 7.6*   LFT No results found for this basename: PROT, ALBUMIN, AST, ALT, ALKPHOS, BILITOT, BILIDIR, IBILI,  in the last 72 hours PT/INR  Recent Labs  09/25/13 0030 09/26/13 1345  LABPROT 16.0* 16.5*  INR 1.31 1.37    Studies/Results: Dg Abd Portable 1v 09/26/2013   CLINICAL DATA:  Abdominal pain  EXAM: PORTABLE ABDOMEN - 1 VIEW  COMPARISON:  Abdominal films of September 25, 2013.  FINDINGS: The bowel gas  pattern does not suggest obstruction or ileus. No abnormal extraluminal gas collections are demonstrated. Portions of the left aspect of the abdomen are excluded from the field-of-view. There is radiodense material in the region of the urinary bladder likely from the previous CT scan. There are surgical clips overlying the right and left L2 transverse processes.  IMPRESSION: There is no evidence of bowel obstruction or ileus. A three-way abdominal series may be useful if further plain film imaging is desired.   Electronically Signed   By: David  [place]   On: 09/26/2013 11:05    ASSESMENT:   * Upper GIB  EGD 12/24 with severe esophagitis, blood in stomach and duodenum but no active bleeding, duodenal ulcerations c/w ischemia  EGD 12/26 with massive duodenal visible vessel in the setting of an ulcer s/p hemoclipping, shallow duodenal ulcers, LA Grade D esophagitis.  12/26 mesenteric angiogram found no active bleeding of Celiac or SMA  Completed PPI drip last PM and now on BID, IV Protonix.  * GOO by CT scan of 12/24. Resolved per KUB 12/27.  * ABL anemia. Latest PRBC 12/27, 3rd thus far. .  * Cirrhosis by CT scan. Not an alcoholic.  * Acute kidney injury, improved.   * New Afib and RVR, no anticoagulation due top GI bleed. On Amio  drip.      PLAN   *  Stop Cipro and Flagyl, this was d/w critical care.   *  CBC in AM *  hh diet *  Ok to move to stepdown.      [name]  09/27/2013, 8:37 AM Pager: [phone]  GI Attending Note  I have personally taken an interval history, reviewed the chart, and examined the patient.  I agree with the extender's note, impression and recommendations.  Signing off.  Barbette Hair. Arlyce Dice, MD, [facility] [phone]

## 2013-09-27 NOTE — Progress Notes (Signed)
Name: WATT GEILER MRN: [id number] DOB: [birth year]  ELECTRONIC ICU PHYSICIAN NOTE  Problem:  Nausea   Intervention:  zofran 4 mg q 6 h prn nausea  Sandrea Hughs 09/27/2013, 5:05 PM

## 2013-09-28 ENCOUNTER — Encounter (HOSPITAL_COMMUNITY): Payer: Self-pay | Admitting: Gastroenterology

## 2013-09-28 DIAGNOSIS — I10 Essential (primary) hypertension: Secondary | ICD-10-CM | POA: Diagnosis present

## 2013-09-28 DIAGNOSIS — R748 Abnormal levels of other serum enzymes: Secondary | ICD-10-CM | POA: Diagnosis present

## 2013-09-28 DIAGNOSIS — I4891 Unspecified atrial fibrillation: Secondary | ICD-10-CM | POA: Diagnosis present

## 2013-09-28 DIAGNOSIS — N179 Acute kidney failure, unspecified: Secondary | ICD-10-CM | POA: Diagnosis present

## 2013-09-28 DIAGNOSIS — E119 Type 2 diabetes mellitus without complications: Secondary | ICD-10-CM | POA: Diagnosis present

## 2013-09-28 DIAGNOSIS — K760 Fatty (change of) liver, not elsewhere classified: Secondary | ICD-10-CM | POA: Diagnosis present

## 2013-09-28 LAB — GLUCOSE, CAPILLARY
Glucose-Capillary: 107 mg/dL — ABNORMAL HIGH (ref 70–99)
Glucose-Capillary: 88 mg/dL (ref 70–99)
Glucose-Capillary: 103 mg/dL — ABNORMAL HIGH (ref 70–99)
Glucose-Capillary: 129 mg/dL — ABNORMAL HIGH (ref 70–99)
Glucose-Capillary: 69 mg/dL — ABNORMAL LOW (ref 70–99)
Glucose-Capillary: 109 mg/dL — ABNORMAL HIGH (ref 70–99)
Glucose-Capillary: 94 mg/dL (ref 70–99)

## 2013-09-28 LAB — CBC
HCT: 26.5 % — ABNORMAL LOW (ref 39.0–52.0)
Hemoglobin: 9 g/dL — ABNORMAL LOW (ref 13.0–17.0)
MCH: 31.1 pg (ref 26.0–34.0)
MCHC: 34 g/dL (ref 30.0–36.0)
MCV: 91.7 fL (ref 78.0–100.0)
Platelets: 253 10*3/uL (ref 150–400)
RBC: 2.89 MIL/uL — ABNORMAL LOW (ref 4.22–5.81)
RDW: 16.9 % — ABNORMAL HIGH (ref 11.5–15.5)
WBC: 10.3 10*3/uL (ref 4.0–10.5)

## 2013-09-28 MED ORDER — PANTOPRAZOLE SODIUM 40 MG PO TBEC
40.0000 mg | DELAYED_RELEASE_TABLET | Freq: Two times a day (BID) | ORAL | Status: DC
  Administered 2013-09-28 – 2013-09-30 (×6): 40 mg via ORAL
  Filled 2013-09-28 (×6): qty 1

## 2013-09-28 MED ORDER — INSULIN ASPART 100 UNIT/ML ~~LOC~~ SOLN
0.0000 [IU] | Freq: Three times a day (TID) | SUBCUTANEOUS | Status: DC
  Administered 2013-09-29: 3 [IU] via SUBCUTANEOUS
  Administered 2013-09-30 – 2013-10-03 (×2): 2 [IU] via SUBCUTANEOUS

## 2013-09-28 NOTE — Progress Notes (Signed)
[facility] Progress Note [place] TEAM 1 - Stepdown ICU Team  CURLY MACKOWSKI ZOX:[id number] DOB: 02-04-1924 DOA: 09/23/2013 PCP: Julian Hy, MD  Brief narrative: 79 year old male initially admitted by the critical care service after presenting with signs consistent with acute GI bleeding and was also hypotensive and required pressors. CT of the abdomen done at time of admission revealed a gastric outlet obstruction with marked distention of the stomach and proximal duodenum with contrast extravasation into the distal duodenum. There was also associated peripancreatic stranding in the area near the duodenum. Patient underwent an initial EGD on 09/23/2013 that revealed esophagitis and multiple shallow duodenal ulcerations. A second EGD was completed on 09/25/2013 which revealed a massive duodenal ulcer with a visible vessel which was clipped. The patient subsequently was evaluated by IR for angiography and possible embolization in setting of ongoing active bleeding. There was also concern that the patient may have been experiencing mesenteric ischemia but an angiogram revealed patent SMA and celiac branches with no source of GI bleeding defined. Patient eventually stabilized from a hemodynamic standpoint. Hemoglobin has remained stable. Patient has received at least 3 units of packed red blood cells since admission. Patient was subsequently transferred to the step down unit.  Assessment/Plan:    Acute blood loss anemia -hgb stable at this time -baseline hgb appears to be ~ 9.7 -has received 3 units PRBCs since admit    Acute duodenal ulcer with bleeding/Acute esophagitis -no further bleeding thus far  -still with melanotic stool -given location of ulcers will check H Pylori -cont PPI -high risk for profound recurrent bleeding -had GOO at presentation due to duodenal edema so cautious advance in diet starting with clears    Atrial fibrillation with RVR -new this  admit -currently rate controlled  -given presenting sx's NOT a candidate for anticoagulation -ECHO Sept 2014 with normal EF and grade 1 DD with normal atria so suspect ABL anemia etiology    Elevated lipase -associated with peripancreatic stranding at duodenum so suspect acute inflammation from ulcers likely cause -check lipid panel given fatty liver    AKI (acute kidney injury) -has returned to baseline    HTN  -BP appears to have recovered but will hold meds an additional 24 hours    Diabetes mellitus, type 2 -current CBGs controlled -was not on meds at home    Overweight (BMI 25.0-29.9)    Fatty liver -seen on [place] -follow up on lipid panel  DVT prophylaxis: SCDs Code Status: Full Family Communication: No family at bedside Disposition Plan/Expected LOS: Remain in step down due to high risk for recurrent bleeding  Consultants: Gastroenterology Intervention radiology Gen Surgery  Procedures: EGD  09/23/2013  EGD  09/25/2013  NGT 12/24 >>> 12/26   R IJ CVL 12/25 >>>   L ard A-line 12/26 >>> 12/27  Antibiotics: Cipro 12/24 >>> 12/28 Flagyl 12/24 >>> 12/28  HPI/Subjective: Patient alert and out of bed to chair. States has poor appetite but no abdominal pain. Still passing melanotic stool  Objective: Blood pressure 145/76, pulse 96, temperature 98 F (36.7 C), temperature source Oral, resp. rate 17, height 5\' 9"  (1.753 m), weight 180 lb 12.4 oz (82 kg), SpO2 98.00%.  Intake/Output Summary (Last 24 hours) at 09/28/13 1359 Last data filed at 09/28/13 1300  Gross per 24 hour  Intake    790 ml  Output   1476 ml  Net   -686 ml   Exam: General: No acute respiratory distress Lungs: Clear to auscultation bilaterally without  wheezes or crackles, RA Cardiovascular: Regular rate and rhythm without murmur gallop or rub normal S1 and S2, no peripheral edema or JVD Abdomen: Nontender, nondistended, soft, bowel sounds positive, no rebound, no ascites, no appreciable  mass Musculoskeletal: No significant cyanosis, clubbing of bilateral lower extremities Neurological: Alert and oriented x 3, moves all extremities x 4 without focal neurological deficits, CN 2-12 intact  Scheduled Meds:  Scheduled Meds: . insulin aspart  0-15 Units Subcutaneous Q4H  . metoCLOPramide (REGLAN) injection  10 mg Intravenous Q6H  . pantoprazole  40 mg Oral BID   Data Reviewed: Basic Metabolic Panel:  Recent Labs Lab 09/23/13 1154 09/24/13 0415 09/25/13 0155 09/25/13 0226 09/26/13 0500 09/27/13 0215  NA 138 144  --  142 145 140  K 3.8 4.1  --  3.3* 3.6 3.5  CL 102 103  --  110 115* 110  CO2 23 26  --  23 24 21   GLUCOSE 151* 123*  --  141* 104* 99  BUN 31* 37*  --  49* 48* 32*  CREATININE 1.06 1.28  --  1.33 1.42* 1.15  CALCIUM 10.1 8.5  --  7.2* 7.2* 7.6*  MG  --  1.4* 1.9  --   --  2.3  PHOS  --  3.8  --   --   --   --    Liver Function Tests:  Recent Labs Lab 09/23/13 1154  AST 25  ALT 17  ALKPHOS 39  BILITOT 0.7  PROT 7.2  ALBUMIN 3.9    Recent Labs Lab 09/25/13 0226 09/27/13 0215  LIPASE >3000* 127*  AMYLASE 730*  --    CBC:  Recent Labs Lab 09/23/13 1154  09/26/13 0226 09/26/13 1409 09/26/13 2026 09/27/13 0215 09/28/13 0500  WBC 14.3*  < > 13.1* 11.1* 11.0* 10.1 10.3  NEUTROABS 11.1*  --   --   --   --   --   --   HGB 14.5  < > 8.4* 9.5* 9.2* 9.2* 9.0*  HCT 42.6  < > 24.9* 27.3* 27.7* 27.1* 26.5*  MCV 92.4  < > 90.9 91.0 91.1 90.6 91.7  PLT 374  < > 210 201 224 213 253  < > = values in this interval not displayed. Cardiac Enzymes:  Recent Labs Lab 09/24/13 0135  TROPONINI <0.30   CBG:  Recent Labs Lab 09/27/13 1920 09/27/13 2327 09/28/13 0329 09/28/13 0829 09/28/13 1209  GLUCAP 115* 107* 88 103* 129*    Recent Results (from the past 240 hour(s))  MRSA PCR SCREENING     Status: None   Collection Time    09/23/13 11:47 PM      Result Value Range Status   MRSA by PCR NEGATIVE  NEGATIVE Final   Comment:             The GeneXpert MRSA Assay (FDA     approved for NASAL specimens     only), is one component of a     comprehensive MRSA colonization     surveillance program. It is not     intended to diagnose MRSA     infection nor to guide or     monitor treatment for     MRSA infections.     Studies:  Recent x-ray studies have been reviewed in detail by the Attending Physician     Junious Silk, ANP [facility] Office  [phone] Pager [phone]  **If unable to reach the above provider after paging please contact the Flow  Manager @ [phone]  On-Call/Text Page:      [email or web address]      password [email or web address]  If 7PM-7AM, please contact night-coverage [email or web address] Password [email or web address] 09/28/2013, 1:59 PM   LOS: 5 days   I have personally examined this patient and reviewed the entire database. I have reviewed the above note, made any necessary editorial changes, and agree with its content.  Lonia Blood, MD [facility]

## 2013-09-28 NOTE — Progress Notes (Signed)
No signs of ongoing bleeding.  Advance diet slowly as tolerated.  Will sign off for now.  Call us if needed.  Wilmon Arms. Corliss Skains, MD, [facility]  General/ Trauma Surgery  09/28/2013 3:01 PM

## 2013-09-28 NOTE — Progress Notes (Signed)
Patient ID: [name], male   DOB: 1924/03/25, 79 y.o.   MRN: [id number] 3 Days Post-Op  Subjective: Pt feels well this morning.  No further signs of bleeding.  Waiting on his clear liquid breakfast.  Objective: Vital signs in last 24 hours: Temp:  [97.8 F (36.6 C)-98.4 F (36.9 C)] 98 F (36.7 C) (12/29 0831) Pulse Rate:  [77-97] 94 (12/29 0800) Resp:  [13-26] 18 (12/29 0800) BP: (104-189)/(49-74) 147/66 mmHg (12/29 0800) SpO2:  [95 %-99 %] 97 % (12/29 0800) Arterial Line BP: (121)/(115) 121/115 mmHg (12/28 1200) Weight:  [180 lb 12.4 oz (82 kg)] 180 lb 12.4 oz (82 kg) (12/29 0800) Last BM Date: 09/28/13  Intake/Output from previous day: 12/28 0701 - 12/29 0700 In: 1783.4 [P.O.:550; I.V.:1133.4; IV Piggyback:100] Out: 1728 [Urine:1725; Stool:3] Intake/Output this shift: Total I/O In: 20 [I.V.:20] Out: 125 [Urine:125]  PE: Abd: soft, NT, ND, +BS  Lab Results:   Recent Labs  09/27/13 0215 09/28/13 0500  WBC 10.1 10.3  HGB 9.2* 9.0*  HCT 27.1* 26.5*  PLT 213 253   BMET  Recent Labs  09/26/13 0500 09/27/13 0215  NA 145 140  K 3.6 3.5  CL 115* 110  CO2 24 21  GLUCOSE 104* 99  BUN 48* 32*  CREATININE 1.42* 1.15  CALCIUM 7.2* 7.6*   PT/INR  Recent Labs  09/26/13 1345  LABPROT 16.5*  INR 1.37   CMP     Component Value Date/Time   NA 140 09/27/2013 0215   K 3.5 09/27/2013 0215   CL 110 09/27/2013 0215   CO2 21 09/27/2013 0215   GLUCOSE 99 09/27/2013 0215   BUN 32* 09/27/2013 0215   CREATININE 1.15 09/27/2013 0215   CALCIUM 7.6* 09/27/2013 0215   PROT 7.2 09/23/2013 1154   ALBUMIN 3.9 09/23/2013 1154   AST 25 09/23/2013 1154   ALT 17 09/23/2013 1154   ALKPHOS 39 09/23/2013 1154   BILITOT 0.7 09/23/2013 1154   GFRNONAA 55* 09/27/2013 0215   GFRAA 64* 09/27/2013 0215   Lipase     Component Value Date/Time   LIPASE 127* 09/27/2013 0215       Studies/Results: Dg Abd Portable 1v  09/26/2013   CLINICAL DATA:  Abdominal pain  EXAM:  PORTABLE ABDOMEN - 1 VIEW  COMPARISON:  Abdominal films of September 25, 2013.  FINDINGS: The bowel gas pattern does not suggest obstruction or ileus. No abnormal extraluminal gas collections are demonstrated. Portions of the left aspect of the abdomen are excluded from the field-of-view. There is radiodense material in the region of the urinary bladder likely from the previous CT scan. There are surgical clips overlying the right and left L2 transverse processes.  IMPRESSION: There is no evidence of bowel obstruction or ileus. A three-way abdominal series may be useful if further plain film imaging is desired.   Electronically Signed   By: David  [place]   On: 09/26/2013 11:05    Anti-infectives: Anti-infectives   Start     Dose/Rate Route Frequency Ordered Stop   09/24/13 1200  metroNIDAZOLE (FLAGYL) IVPB 500 mg  Status:  Discontinued     500 mg 100 mL/hr over 60 Minutes Intravenous Every 8 hours 09/24/13 0753 09/27/13 0917   09/24/13 1000  ciprofloxacin (CIPRO) IVPB 400 mg  Status:  Discontinued     400 mg 200 mL/hr over 60 Minutes Intravenous Every 12 hours 09/24/13 0018 09/24/13 0744   09/24/13 1000  ciprofloxacin (CIPRO) IVPB 400 mg  Status:  Discontinued  400 mg 200 mL/hr over 60 Minutes Intravenous Every 12 hours 09/24/13 0748 09/27/13 0917   09/24/13 0800  metroNIDAZOLE (FLAGYL) IVPB 500 mg  Status:  Discontinued     500 mg 100 mL/hr over 60 Minutes Intravenous Every 8 hours 09/24/13 0747 09/24/13 0753   09/24/13 0800  metroNIDAZOLE (FLAGYL) IVPB 500 mg  Status:  Discontinued     500 mg 100 mL/hr over 60 Minutes Intravenous Every 8 hours 09/24/13 0748 09/24/13 0748   09/24/13 0600  metroNIDAZOLE (FLAGYL) IVPB 500 mg  Status:  Discontinued     500 mg 100 mL/hr over 60 Minutes Intravenous Every 8 hours 09/24/13 0014 09/24/13 0744   09/23/13 2100  ciprofloxacin (CIPRO) IVPB 400 mg     400 mg 200 mL/hr over 60 Minutes Intravenous  Once 09/23/13 2049 09/23/13 2337   09/23/13 2100   metroNIDAZOLE (FLAGYL) IVPB 500 mg     500 mg 100 mL/hr over 60 Minutes Intravenous  Once 09/23/13 2049 09/23/13 2336       Assessment/Plan  1. UGI bleed secondary to duodenal ulcers.  Noted in 4th portion of duodenum Patient Active Problem List   Diagnosis Date Noted  . Acute duodenal ulcer with bleeding 09/26/2013  . UGI bleed 09/23/2013  . Pre-syncope 06/16/2013  . Expected blood loss anemia 04/29/2013  . Overweight (BMI 25.0-29.9) 04/29/2013  . S/P right THA, AA 04/28/2013   Plan: 1. hgb is stable at 9.2.  No further evidence of bleeding.  S/p embolization this admission. 2. May advance diet as tolerates.  No plans for surgery at this time.  LOS: 5 days    Griffon Herberg E 09/28/2013, 9:45 AM Pager: [phone]

## 2013-09-29 ENCOUNTER — Inpatient Hospital Stay (HOSPITAL_COMMUNITY): Payer: Medicare Other

## 2013-09-29 LAB — GLUCOSE, CAPILLARY
Glucose-Capillary: 105 mg/dL — ABNORMAL HIGH (ref 70–99)
Glucose-Capillary: 99 mg/dL (ref 70–99)
Glucose-Capillary: 116 mg/dL — ABNORMAL HIGH (ref 70–99)
Glucose-Capillary: 130 mg/dL — ABNORMAL HIGH (ref 70–99)
Glucose-Capillary: 154 mg/dL — ABNORMAL HIGH (ref 70–99)

## 2013-09-29 LAB — CBC
HCT: 25.1 % — ABNORMAL LOW (ref 39.0–52.0)
Hemoglobin: 8.5 g/dL — ABNORMAL LOW (ref 13.0–17.0)
MCH: 31.1 pg (ref 26.0–34.0)
MCHC: 33.9 g/dL (ref 30.0–36.0)
MCV: 91.9 fL (ref 78.0–100.0)
Platelets: 282 10*3/uL (ref 150–400)
RBC: 2.73 MIL/uL — ABNORMAL LOW (ref 4.22–5.81)
RDW: 16.4 % — ABNORMAL HIGH (ref 11.5–15.5)
WBC: 16.5 10*3/uL — ABNORMAL HIGH (ref 4.0–10.5)

## 2013-09-29 LAB — LIPID PANEL
Cholesterol: 100 mg/dL (ref 0–200)
HDL: 24 mg/dL — ABNORMAL LOW (ref >39)
LDL Cholesterol: 53 mg/dL (ref 0–99)
Total CHOL/HDL Ratio: 4.2 RATIO
Triglycerides: 116 mg/dL (ref <150)
VLDL: 23 mg/dL (ref 0–40)

## 2013-09-29 LAB — COMPREHENSIVE METABOLIC PANEL
ALT: 12 U/L (ref 0–53)
AST: 28 U/L (ref 0–37)
Albumin: 2.3 g/dL — ABNORMAL LOW (ref 3.5–5.2)
Alkaline Phosphatase: 40 U/L (ref 39–117)
BUN: 22 mg/dL (ref 6–23)
CO2: 21 mEq/L (ref 19–32)
Calcium: 8 mg/dL — ABNORMAL LOW (ref 8.4–10.5)
Chloride: 106 mEq/L (ref 96–112)
Creatinine, Ser: 1.05 mg/dL (ref 0.50–1.35)
GFR calc Af Amer: 72 mL/min — ABNORMAL LOW (ref >90)
GFR calc non Af Amer: 62 mL/min — ABNORMAL LOW (ref >90)
Glucose, Bld: 111 mg/dL — ABNORMAL HIGH (ref 70–99)
Potassium: 3.9 mEq/L (ref 3.7–5.3)
Sodium: 139 mEq/L (ref 137–147)
Total Bilirubin: 0.6 mg/dL (ref 0.3–1.2)
Total Protein: 5 g/dL — ABNORMAL LOW (ref 6.0–8.3)

## 2013-09-29 LAB — LIPASE, BLOOD: Lipase: 135 U/L — ABNORMAL HIGH (ref 11–59)

## 2013-09-29 LAB — HELICOBACTER PYLORI ABS-IGG+IGA, BLD
H Pylori IgA: 6.8 U/mL (ref <9.0)
H Pylori IgG: 0.41 {ISR}

## 2013-09-29 MED ORDER — HYDROCODONE-HOMATROPINE 5-1.5 MG/5ML PO SYRP
5.0000 mL | ORAL_SOLUTION | Freq: Four times a day (QID) | ORAL | Status: DC | PRN
  Administered 2013-09-29 – 2013-10-05 (×8): 5 mL via ORAL
  Filled 2013-09-29 (×8): qty 5

## 2013-09-29 NOTE — Progress Notes (Signed)
[facility] Progress Note [name] VWU:[id number] DOB: 1924/08/24 DOA: 09/23/2013 PCP: Julian Hy, MD  Brief narrative: 79 year old male initially admitted by the critical care service after presenting with signs consistent with acute GI bleeding and was also hypotensive and required pressors. CT of the abdomen done at time of admission revealed a gastric outlet obstruction with marked distention of the stomach and proximal duodenum with contrast extravasation into the distal duodenum. There was also associated peripancreatic stranding in the area near the duodenum. Patient underwent an initial EGD on 09/23/2013 that revealed esophagitis and multiple shallow duodenal ulcerations. A second EGD was completed on 09/25/2013 which revealed a massive duodenal ulcer with a visible vessel which was clipped. The patient subsequently was evaluated by IR for angiography and possible embolization in setting of ongoing active bleeding. There was also concern that the patient may have been experiencing mesenteric ischemia but an angiogram revealed patent SMA and celiac branches with no source of GI bleeding defined. Patient eventually stabilized from a hemodynamic standpoint. Hemoglobin has remained stable. Patient has received at least 3 units of packed red blood cells since admission. Patient was subsequently transferred to the step down unit.  Assessment/Plan:    Acute blood loss anemia -hgb essentially stable at this time but has drifted down to 8.5 -baseline hgb appears to be ~ 9.7 -has received 3 units PRBCs since admit    Acute duodenal ulcer with bleeding/Acute esophagitis -no further bleeding thus far  -last melanotic stool 12/29 - H Pylori IgG normal- IgA pending -cont PPI -high risk recurrent bleeding -had GOO at presentation due to duodenal edema so cautious advance in diet -tolerated clears so will allow full liquids today    Atrial  fibrillation with RVR -new this admit -currently rate controlled  -given presenting sx's NOT a candidate for anticoagulation -ECHO Sept 2014 with normal EF and grade 1 DD with normal atria so suspect ABL anemia etiology    Elevated lipase -associated with peripancreatic stranding at duodenum so suspect acute inflammation from ulcers likely cause -check lipid panel TG normal but HDL suboptimal   Cough -? Due to GERD vs asp PNA in setting of recent GIB with prior NGT and EGD x 2 -normal CXR -SLP evaluation -sx management    AKI (acute kidney injury) -has returned to baseline    HTN  -BP appears to have recovered but will hold meds an additional 24 hours    Diabetes mellitus, type 2 -current CBGs controlled -was not on meds at home    Overweight (BMI 25.0-29.9)    Fatty liver -seen on [place] -TG normal on lipid panel  DVT prophylaxis: SCDs Code Status: Full Family Communication:daughter at bedside Disposition Plan/Expected LOS: Transfer to Telemetry  Consultants: Gastroenterology Intervention radiology Gen Surgery  Procedures: EGD  09/23/2013  EGD  09/25/2013  NGT 12/24 >>> 12/26   R IJ CVL 12/25 >>>   L ard A-line 12/26 >>> 12/27  Antibiotics: Cipro 12/24 >>> 12/28 Flagyl 12/24 >>> 12/28  HPI/Subjective: Patient alert and c/o cough- wet sounding in throat yet lungs clear- seems to have refluxive sx's  Objective: Blood pressure 128/70, pulse 98, temperature 97.9 F (36.6 C), temperature source Oral, resp. rate 20, height 5\' 9"  (1.753 m), weight 176 lb 2.4 oz (79.9 kg), SpO2 98.00%.  Intake/Output Summary (Last 24 hours) at 09/29/13 1113 Last data filed at 09/29/13 0929  Gross per 24 hour  Intake    240 ml  Output   2175 ml  Net  -1935 ml   Exam: General: No acute respiratory distress Lungs: Clear to auscultation bilaterally without wheezes or crackles, RA Cardiovascular: Regular rate and rhythm without murmur gallop or rub normal S1 and S2, no  peripheral edema or JVD Abdomen: Nontender, nondistended, soft, bowel sounds positive, no rebound, no ascites, no appreciable mass Musculoskeletal: No significant cyanosis, clubbing of bilateral lower extremities Neurological: Alert and oriented x 3, moves all extremities x 4 without focal neurological deficits, CN 2-12 intact  Scheduled Meds:  Scheduled Meds: . insulin aspart  0-15 Units Subcutaneous TID WC  . metoCLOPramide (REGLAN) injection  10 mg Intravenous Q6H  . pantoprazole  40 mg Oral BID   Data Reviewed: Basic Metabolic Panel:  Recent Labs Lab 09/24/13 0415 09/25/13 0155 09/25/13 0226 09/26/13 0500 09/27/13 0215 09/29/13 0500  NA 144  --  142 145 140 139  K 4.1  --  3.3* 3.6 3.5 3.9  CL 103  --  110 115* 110 106  CO2 26  --  23 24 21 21   GLUCOSE 123*  --  141* 104* 99 111*  BUN 37*  --  49* 48* 32* 22  CREATININE 1.28  --  1.33 1.42* 1.15 1.05  CALCIUM 8.5  --  7.2* 7.2* 7.6* 8.0*  MG 1.4* 1.9  --   --  2.3  --   PHOS 3.8  --   --   --   --   --    Liver Function Tests:  Recent Labs Lab 09/23/13 1154 09/29/13 0500  AST 25 28  ALT 17 12  ALKPHOS 39 40  BILITOT 0.7 0.6  PROT 7.2 5.0*  ALBUMIN 3.9 2.3*    Recent Labs Lab 09/25/13 0226 09/27/13 0215  LIPASE >3000* 127*  AMYLASE 730*  --    CBC:  Recent Labs Lab 09/23/13 1154  09/26/13 1409 09/26/13 2026 09/27/13 0215 09/28/13 0500 09/29/13 0500  WBC 14.3*  < > 11.1* 11.0* 10.1 10.3 16.5*  NEUTROABS 11.1*  --   --   --   --   --   --   HGB 14.5  < > 9.5* 9.2* 9.2* 9.0* 8.5*  HCT 42.6  < > 27.3* 27.7* 27.1* 26.5* 25.1*  MCV 92.4  < > 91.0 91.1 90.6 91.7 91.9  PLT 374  < > 201 224 213 253 282  < > = values in this interval not displayed. Cardiac Enzymes:  Recent Labs Lab 09/24/13 0135  TROPONINI <0.30   CBG:  Recent Labs Lab 09/28/13 1702 09/28/13 1816 09/28/13 2228 09/29/13 0731 09/29/13 0809  GLUCAP 69* 109* 94 99 105*    Recent Results (from the past 240 hour(s))  MRSA  PCR SCREENING     Status: None   Collection Time    09/23/13 11:47 PM      Result Value Range Status   MRSA by PCR NEGATIVE  NEGATIVE Final   Comment:            The GeneXpert MRSA Assay (FDA     approved for NASAL specimens     only), is one component of a     comprehensive MRSA colonization     surveillance program. It is not     intended to diagnose MRSA     infection nor to guide or     monitor treatment for     MRSA infections.     Studies:  Recent x-ray studies have been reviewed in  detail by the Attending Physician     Junious Silk, ANP [facility] Office  [phone] Pager [phone]  **If unable to reach the above provider after paging please contact the Flow Manager @ [phone]  On-Call/Text Page:      [email or web address]      password [email or web address]  If 7PM-7AM, please contact night-coverage [email or web address] Password [email or web address] 09/29/2013, 11:13 AM   LOS: 6 days      I have examined the patient, reviewed the chart and modified the above note which I agree with.   Livia Tarr,MD [phone] 09/29/2013, 6:39 PM

## 2013-09-29 NOTE — Progress Notes (Signed)
Pt has md orders to move to 6 north room 2. Pt and pt's family informed. Called 6 [address] nurse and gave report. Pt in no distress. CNA is moving pt to floor via wheelchair.

## 2013-09-29 NOTE — Progress Notes (Signed)
ARRIVED TO ROOM 6N02, ORIENTED TO ROOM AND SURROUNDINGS, DENIES NAUSEA/PAIN AT THIS TIME.

## 2013-09-30 DIAGNOSIS — I10 Essential (primary) hypertension: Secondary | ICD-10-CM

## 2013-09-30 DIAGNOSIS — Z6825 Body mass index (BMI) 25.0-25.9, adult: Secondary | ICD-10-CM

## 2013-09-30 DIAGNOSIS — I4891 Unspecified atrial fibrillation: Secondary | ICD-10-CM

## 2013-09-30 DIAGNOSIS — N179 Acute kidney failure, unspecified: Secondary | ICD-10-CM

## 2013-09-30 DIAGNOSIS — K7689 Other specified diseases of liver: Secondary | ICD-10-CM

## 2013-09-30 DIAGNOSIS — E663 Overweight: Secondary | ICD-10-CM

## 2013-09-30 DIAGNOSIS — E119 Type 2 diabetes mellitus without complications: Secondary | ICD-10-CM

## 2013-09-30 DIAGNOSIS — R748 Abnormal levels of other serum enzymes: Secondary | ICD-10-CM

## 2013-09-30 LAB — GLUCOSE, CAPILLARY
Glucose-Capillary: 113 mg/dL — ABNORMAL HIGH (ref 70–99)
Glucose-Capillary: 125 mg/dL — ABNORMAL HIGH (ref 70–99)
Glucose-Capillary: 112 mg/dL — ABNORMAL HIGH (ref 70–99)
Glucose-Capillary: 113 mg/dL — ABNORMAL HIGH (ref 70–99)

## 2013-09-30 LAB — CBC
HCT: 24 % — ABNORMAL LOW (ref 39.0–52.0)
Hemoglobin: 8 g/dL — ABNORMAL LOW (ref 13.0–17.0)
MCH: 30.9 pg (ref 26.0–34.0)
MCHC: 33.3 g/dL (ref 30.0–36.0)
MCV: 92.7 fL (ref 78.0–100.0)
Platelets: 328 10*3/uL (ref 150–400)
RBC: 2.59 MIL/uL — ABNORMAL LOW (ref 4.22–5.81)
RDW: 16.3 % — ABNORMAL HIGH (ref 11.5–15.5)
WBC: 11 10*3/uL — ABNORMAL HIGH (ref 4.0–10.5)

## 2013-09-30 LAB — HEMOGLOBIN AND HEMATOCRIT, BLOOD
HCT: 22.2 % — ABNORMAL LOW (ref 39.0–52.0)
Hemoglobin: 7.4 g/dL — ABNORMAL LOW (ref 13.0–17.0)

## 2013-09-30 NOTE — Progress Notes (Signed)
[facility]                                                                                Patient Demographics  [name], is a 79 y.o. male, DOB - 09/24/1924, WUX:[id number]  Admit date - 09/23/2013   Admitting Physician Oretha Milch, MD  Outpatient Primary MD for the patient is Julian Hy, MD  LOS - 7   Chief Complaint  Patient presents with  . Vomiting  . Chest Pain        Assessment & Plan    Active Problems:   Acute blood loss anemia   Overweight (BMI 25.0-29.9)   Acute duodenal ulcer with bleeding   Acute esophagitis   Fatty liver   Atrial fibrillation with RVR   Elevated lipase   HTN (hypertension)   AKI (acute kidney injury)   Diabetes mellitus, type 2  Acute blood loss anemia  -Hb currently 8.0, baseline appears to be 9.7 -has received 3 units PRBCs since admit  -Will continue to monitor every 12 hours.  Acute duodenal ulcer with bleeding/Acute esophagitis  -no further bleeding thus far, although patient is high-risk for rebleeding  -last melanotic stool 12/29  -H Pylori IgG normal- IgA 6.8 -Continue Protonix 40 mg twice a day -Will continue full liquid diet and advance as tolerated  New onset Atrial fibrillation with RVR  -currently rate controlled  -Likely secondary to acute blood loss anemia -Given patient's current risk of bleeding, patient is not an anticoagulation candidate -ECHO Sept 2014 with normal EF and grade 1 diastolic dysfunction with normal atria   Elevated lipase  -associated with peripancreatic stranding at duodenum so suspect acute inflammation from ulcers likely cause  -Lipid: 100/116/24/53  Cough  -? Due to GERD vs asp PNA in setting of recent GIB with prior NGT and EGD x 2  -normal CXR  -SLP evaluation  -sx management   AKI (acute kidney injury)  -Resolved   HTN  -Controlled, we'll continue to monitor  Diabetes mellitus, type 2  -current CBGs controlled, will continue insulin sliding scale with CBG  monitoring-was not on meds at home   Overweight (BMI 25.0-29.9)   Fatty liver  -seen on [place]  -TG normal on lipid panel  Code Status: full Family Communication: Daughter at bedside Disposition Plan: Admitted, will continue to monitor  Procedures  EGD 09/23/2013, 09/25/2013 NG tube placed 09/23/2013, discontinued 09/25/2013  Consults   Gastroenterology Interventional radiology General surgery  DVT Prophylaxis SCDs  Lab Results  Component Value Date   PLT 328 09/30/2013    Medications  Scheduled Meds: . insulin aspart  0-15 Units Subcutaneous TID WC  . metoCLOPramide (REGLAN) injection  10 mg Intravenous Q6H  . pantoprazole  40 mg Oral BID   Continuous Infusions: . sodium chloride Stopped (09/28/13 0800)   PRN Meds:.acetaminophen, bisacodyl, diphenhydrAMINE, HYDROcodone-homatropine, menthol-cetylpyridinium, ondansetron  Antibiotics    Anti-infectives   Start     Dose/Rate Route Frequency Ordered Stop   09/24/13 1200  metroNIDAZOLE (FLAGYL) IVPB 500 mg  Status:  Discontinued     500 mg 100 mL/hr over 60 Minutes Intravenous Every 8 hours 09/24/13 0753 09/27/13 0917   09/24/13 1000  ciprofloxacin (CIPRO) IVPB 400 mg  Status:  Discontinued     400 mg 200 mL/hr over 60 Minutes Intravenous Every 12 hours 09/24/13 0018 09/24/13 0744   09/24/13 1000  ciprofloxacin (CIPRO) IVPB 400 mg  Status:  Discontinued     400 mg 200 mL/hr over 60 Minutes Intravenous Every 12 hours 09/24/13 0748 09/27/13 0917   09/24/13 0800  metroNIDAZOLE (FLAGYL) IVPB 500 mg  Status:  Discontinued     500 mg 100 mL/hr over 60 Minutes Intravenous Every 8 hours 09/24/13 0747 09/24/13 0753   09/24/13 0800  metroNIDAZOLE (FLAGYL) IVPB 500 mg  Status:  Discontinued     500 mg 100 mL/hr over 60 Minutes Intravenous Every 8 hours 09/24/13 0748 09/24/13 0748   09/24/13 0600  metroNIDAZOLE (FLAGYL) IVPB 500 mg  Status:  Discontinued     500 mg 100 mL/hr over 60 Minutes Intravenous Every 8 hours 09/24/13  0014 09/24/13 0744   09/23/13 2100  ciprofloxacin (CIPRO) IVPB 400 mg     400 mg 200 mL/hr over 60 Minutes Intravenous  Once 09/23/13 2049 09/23/13 2337   09/23/13 2100  metroNIDAZOLE (FLAGYL) IVPB 500 mg     500 mg 100 mL/hr over 60 Minutes Intravenous  Once 09/23/13 2049 09/23/13 2336       Time Spent in minutes   30 minutes   Elisheva Fallas D.O. on 09/30/2013 at 3:10 PM  Between 7am to 7pm - Pager - [phone]  After 7pm go to [email or web address] - password [email or web address]  And look for the night coverage person covering for me after hours  [facility] Office  [phone]    Subjective:   [name] seen and examined today.  Patient states he's currently feeling fine. He denies any shortness of breath or chest pain.   Objective:   Filed Vitals:   09/29/13 1605 09/29/13 2100 09/30/13 0629 09/30/13 1444  BP: 156/56 118/47 131/68 126/48  Pulse: 81 102 96 97  Temp: 97.5 F (36.4 C) 98.9 F (37.2 C) 98.1 F (36.7 C) 98.2 F (36.8 C)  TempSrc: Oral Oral Oral Oral  Resp: 20 22 17 18   Height:      Weight:      SpO2: 100% 96% 98% 98%    Wt Readings from Last 3 Encounters:  09/29/13 79.9 kg (176 lb 2.4 oz)  09/29/13 79.9 kg (176 lb 2.4 oz)  09/29/13 79.9 kg (176 lb 2.4 oz)     Intake/Output Summary (Last 24 hours) at 09/30/13 1510 Last data filed at 09/30/13 1028  Gross per 24 hour  Intake    367 ml  Output   1650 ml  Net  -1283 ml    Exam  General: Well developed, well nourished, NAD, appears stated age  HEENT: NCAT, PERRLA, EOMI, Anicteic Sclera, mucous membranes moist. No pharyngeal erythema or exudates  Neck: Supple, no JVD, no masses  Cardiovascular: S1 S2 auscultated, no rubs, murmurs or gallops. Regular rate and rhythm.  Respiratory: Clear to auscultation bilaterally with equal chest rise  Abdomen: Soft, nontender, nondistended, + bowel sounds  Extremities: warm dry without cyanosis clubbing or edema  Neuro: AAOx3, cranial nerves grossly  intact. Strength 5/5 in patient's upper and lower extremities bilaterally  Skin: Without rashes exudates or nodules  Psych: Normal affect and demeanor with intact judgement and insight  Data Review   Micro Results Recent Results (from the past 240 hour(s))  MRSA PCR SCREENING     Status: None   Collection Time  09/23/13 11:47 PM      Result Value Range Status   MRSA by PCR NEGATIVE  NEGATIVE Final   Comment:            The GeneXpert MRSA Assay (FDA     approved for NASAL specimens     only), is one component of a     comprehensive MRSA colonization     surveillance program. It is not     intended to diagnose MRSA     infection nor to guide or     monitor treatment for     MRSA infections.    Radiology Reports Dg Chest 1 View  09/23/2013   CLINICAL DATA:  Chest pain  EXAM: CHEST - 1 VIEW  COMPARISON:  04/22/2013  FINDINGS: The heart size and mediastinal contours are within normal limits. Both lungs are clear. The visualized skeletal structures are unremarkable.  IMPRESSION: No active disease.   Electronically Signed   By: Alcide Clever M.D.   On: 09/23/2013 12:22   Dg Chest 2 View  09/29/2013   CLINICAL DATA:  Cough  EXAM: CHEST  2 VIEW  COMPARISON:  09/25/2013  FINDINGS: The cardiac shadow is stable. A right-sided central venous line is again seen and stable. The lungs are well aerated. Mild patchy changes are noted bilaterally in the bases although stable from the prior study. The nasogastric catheter is been removed in the interval. No new focal abnormality is seen.  IMPRESSION: Stable bibasilar changes.  No new focal abnormality is noted.   Electronically Signed   By: Alcide Clever M.D.   On: 09/29/2013 11:19   Ct Abdomen Pelvis W Contrast  09/23/2013   CLINICAL DATA:  Vomiting, epigastric pain  EXAM: CT ABDOMEN AND PELVIS WITH CONTRAST  TECHNIQUE: Multidetector CT imaging of the abdomen and pelvis was performed using the standard protocol following bolus administration of  intravenous contrast.  CONTRAST:  OMNIPAQUE IOHEXOL 300 MG/ML  SOLN  COMPARISON:  09/23/2013 abdominal ultrasound  FINDINGS: Minor dependent basilar atelectasis bilaterally. Normal heart size. No pericardial effusion. Trace left pleural effusion. Distal esophagus is patulous and fluid-filled, suspect esophageal dysmotility. Coronary calcifications noted. Degenerative changes of the lower thoracic spine with large osteophytes.  Abdomen: Mild nodularity to the liver surface compatible with cirrhosis. No biliary dilatation. Gallbladder, biliary system, pancreas, spleen, and adrenal glands are within normal limits for age and demonstrate no acute finding. Incidental splenic calcification compatible granulomatous disease, image 29.  The stomach and proximal duodenum are distended with hyperdense heterogeneous fluid. Third portion of the duodenum demonstrates intraluminal dense contrast. This is compatible with contrast extravasation/active duodenal bleeding. This portion of the duodenum demonstrates circumferential wall thickening. Appearance is compatible with bleeding from a duodenal ulcer with duodenitis versus a duodenum mass.  Kidneys demonstrate hypodense renal cysts bilaterally. Largest cyst in the left kidney midpole measures 13 mm, image 38. No renal obstruction or hydronephrosis. No obstructing urinary tract calculi.  Atherosclerotic changes of the aorta diffusely. Negative for aneurysm or dissection.  No free air, fluid collection, or abscess. Diffuse colonic diverticulosis present.  Pelvis: Extensive sigmoid region diverticulosis. No pelvic free fluid, fluid collection, hemorrhage, abscess, or adenopathy. No inguinal abnormality or hernia. Urinary bladder unremarkable. Previous right hip arthroplasty noted. Diffuse degenerative changes of the spine and pelvis.  IMPRESSION: Active contrast extravasation/ bleeding within the distal duodenum in an area of duodenal wall thickening compatible with duodenal  ulcer disease with duodenitis versus a duodenal mass. Associated gastric outlet obstruction with  marked distention of the stomach and proximal duodenum.  Bibasilar atelectasis and trace left pleural fluid  Probable mild hepatic cirrhosis  Aortic atherosclerosis without occlusion  Incidental renal cysts  Colonic diverticulosis  These results were called by telephone at the time of interpretation on 09/23/2013 at 6:35 PM to Dr. Derwood Kaplan , who verbally acknowledged these results.   Electronically Signed   By: Ruel Favors M.D.   On: 09/23/2013 18:49   Ir Angiogram Visceral Selective  09/27/2013   CLINICAL DATA:  Gastrointestinal bleeding. Duodenal ulcer was clipped.  EXAM: SELECTIVE VISCERAL ARTERIOGRAPHY; ADDITIONAL ARTERIOGRAPHY; IR ULTRASOUND GUIDANCE VASC ACCESS RIGHT  MEDICATIONS AND MEDICAL HISTORY: Versed 5 mg, Fentanyl 25 mcg.  ANESTHESIA/SEDATION: Moderate sedation time: 25 minutes  CONTRAST:  80 cc Omnipaque 300  FLUOROSCOPY TIME:  4 min and 48 seconds.  PROCEDURE: The procedure, risks, benefits, and alternatives were explained to the patient. Questions regarding the procedure were encouraged and answered. The patient understands and consents to the procedure.  The right groin was prepped with Betadine in a sterile fashion, and a sterile drape was applied covering the operative field. A sterile gown and sterile gloves were used for the procedure.  Under sonographic guidance, a in 19 gauge needle was inserted into the common femoral artery and removed over a Bentson. A 5 French sheath was inserted. A cobra 2 catheter was advanced over the Bentson wire and into the celiac axis. Contrast was injected. It was advanced over a glidewire into the common hepatic artery and angiography was performed. The catheter was advanced into the gastroduodenal artery and repeat imaging was performed.  The catheter was retracted and advanced over a Bentson wire into the SMA. Angiography was performed in the AP and  bilateral oblique views. The cobra catheter was removed. Femoral angiography was performed. Exo seal was deployed for hemostasis without complication  COMPLICATIONS: None  FINDINGS: Injection of the celiac axis demonstrates vascular anatomy and no source of gastrointestinal bleeding. Specifically, injection of the gastroduodenal artery demonstrates no active extravasation of contrast or pseudoaneurysm. Of note, the endoscopic clips within the distal duodenum are apparently beyond the vascular distribution of the gastroduodenal artery.  Injection of the SMA demonstrates no source of active gastrointestinal bleeding. Jejunal branches leading towards the duodenal clips or noted. Embolization was not performed.  IMPRESSION: Gastrointestinal angiography demonstrates no source of bleeding. Both the celiac and SMA were injected. The region of the bleeding noted on endoscopy was beyond the distribution of the gastroduodenal artery, therefore empiric embolization was not performed. The source vessel also not be identified from the SMA injection.   Electronically Signed   By: Maryclare Bean M.D.   On: 09/27/2013 13:21   Ir Angiogram Visceral Selective  09/27/2013   CLINICAL DATA:  Gastrointestinal bleeding. Duodenal ulcer was clipped.  EXAM: SELECTIVE VISCERAL ARTERIOGRAPHY; ADDITIONAL ARTERIOGRAPHY; IR ULTRASOUND GUIDANCE VASC ACCESS RIGHT  MEDICATIONS AND MEDICAL HISTORY: Versed 5 mg, Fentanyl 25 mcg.  ANESTHESIA/SEDATION: Moderate sedation time: 25 minutes  CONTRAST:  80 cc Omnipaque 300  FLUOROSCOPY TIME:  4 min and 48 seconds.  PROCEDURE: The procedure, risks, benefits, and alternatives were explained to the patient. Questions regarding the procedure were encouraged and answered. The patient understands and consents to the procedure.  The right groin was prepped with Betadine in a sterile fashion, and a sterile drape was applied covering the operative field. A sterile gown and sterile gloves were used for the procedure.   Under sonographic guidance, a in 19 gauge needle was  inserted into the common femoral artery and removed over a Bentson. A 5 French sheath was inserted. A cobra 2 catheter was advanced over the Bentson wire and into the celiac axis. Contrast was injected. It was advanced over a glidewire into the common hepatic artery and angiography was performed. The catheter was advanced into the gastroduodenal artery and repeat imaging was performed.  The catheter was retracted and advanced over a Bentson wire into the SMA. Angiography was performed in the AP and bilateral oblique views. The cobra catheter was removed. Femoral angiography was performed. Exo seal was deployed for hemostasis without complication  COMPLICATIONS: None  FINDINGS: Injection of the celiac axis demonstrates vascular anatomy and no source of gastrointestinal bleeding. Specifically, injection of the gastroduodenal artery demonstrates no active extravasation of contrast or pseudoaneurysm. Of note, the endoscopic clips within the distal duodenum are apparently beyond the vascular distribution of the gastroduodenal artery.  Injection of the SMA demonstrates no source of active gastrointestinal bleeding. Jejunal branches leading towards the duodenal clips or noted. Embolization was not performed.  IMPRESSION: Gastrointestinal angiography demonstrates no source of bleeding. Both the celiac and SMA were injected. The region of the bleeding noted on endoscopy was beyond the distribution of the gastroduodenal artery, therefore empiric embolization was not performed. The source vessel also not be identified from the SMA injection.   Electronically Signed   By: Maryclare Bean M.D.   On: 09/27/2013 13:21   Ir Angiogram Selective Each Additional Vessel  09/27/2013   CLINICAL DATA:  Gastrointestinal bleeding. Duodenal ulcer was clipped.  EXAM: SELECTIVE VISCERAL ARTERIOGRAPHY; ADDITIONAL ARTERIOGRAPHY; IR ULTRASOUND GUIDANCE VASC ACCESS RIGHT  MEDICATIONS AND MEDICAL  HISTORY: Versed 5 mg, Fentanyl 25 mcg.  ANESTHESIA/SEDATION: Moderate sedation time: 25 minutes  CONTRAST:  80 cc Omnipaque 300  FLUOROSCOPY TIME:  4 min and 48 seconds.  PROCEDURE: The procedure, risks, benefits, and alternatives were explained to the patient. Questions regarding the procedure were encouraged and answered. The patient understands and consents to the procedure.  The right groin was prepped with Betadine in a sterile fashion, and a sterile drape was applied covering the operative field. A sterile gown and sterile gloves were used for the procedure.  Under sonographic guidance, a in 19 gauge needle was inserted into the common femoral artery and removed over a Bentson. A 5 French sheath was inserted. A cobra 2 catheter was advanced over the Bentson wire and into the celiac axis. Contrast was injected. It was advanced over a glidewire into the common hepatic artery and angiography was performed. The catheter was advanced into the gastroduodenal artery and repeat imaging was performed.  The catheter was retracted and advanced over a Bentson wire into the SMA. Angiography was performed in the AP and bilateral oblique views. The cobra catheter was removed. Femoral angiography was performed. Exo seal was deployed for hemostasis without complication  COMPLICATIONS: None  FINDINGS: Injection of the celiac axis demonstrates vascular anatomy and no source of gastrointestinal bleeding. Specifically, injection of the gastroduodenal artery demonstrates no active extravasation of contrast or pseudoaneurysm. Of note, the endoscopic clips within the distal duodenum are apparently beyond the vascular distribution of the gastroduodenal artery.  Injection of the SMA demonstrates no source of active gastrointestinal bleeding. Jejunal branches leading towards the duodenal clips or noted. Embolization was not performed.  IMPRESSION: Gastrointestinal angiography demonstrates no source of bleeding. Both the celiac and SMA  were injected. The region of the bleeding noted on endoscopy was beyond the distribution of the gastroduodenal  artery, therefore empiric embolization was not performed. The source vessel also not be identified from the SMA injection.   Electronically Signed   By: Maryclare Bean M.D.   On: 09/27/2013 13:21   US Abdomen Limited  09/23/2013   CLINICAL DATA:  Vomiting  EXAM: US ABDOMEN LIMITED - RIGHT UPPER QUADRANT  COMPARISON:  None.  FINDINGS: Gallbladder  No gallstones or wall thickening visualized. There is no pericholecystic fluid. No sonographic Murphy sign noted.  Common bile duct  Diameter: 3 mm. There is no intrahepatic or extrahepatic biliary duct dilatation.  Liver:  No focal lesion identified. Liver echogenicity is somewhat increased and inhomogeneous.  IMPRESSION: Liver echogenicity is somewhat increased and inhomogeneous. This appearance is consistent with fatty change and/or underlying parenchymal disease. While no focal liver lesions are identified, it must be cautioned that the sensitivity of ultrasound for focal liver lesions is diminished in this circumstance. Study is otherwise unremarkable.   Electronically Signed   By: Bretta Bang M.D.   On: 09/23/2013 14:48   Ir US Guide Vasc Access Right  09/27/2013   CLINICAL DATA:  Gastrointestinal bleeding. Duodenal ulcer was clipped.  EXAM: SELECTIVE VISCERAL ARTERIOGRAPHY; ADDITIONAL ARTERIOGRAPHY; IR ULTRASOUND GUIDANCE VASC ACCESS RIGHT  MEDICATIONS AND MEDICAL HISTORY: Versed 5 mg, Fentanyl 25 mcg.  ANESTHESIA/SEDATION: Moderate sedation time: 25 minutes  CONTRAST:  80 cc Omnipaque 300  FLUOROSCOPY TIME:  4 min and 48 seconds.  PROCEDURE: The procedure, risks, benefits, and alternatives were explained to the patient. Questions regarding the procedure were encouraged and answered. The patient understands and consents to the procedure.  The right groin was prepped with Betadine in a sterile fashion, and a sterile drape was applied covering the  operative field. A sterile gown and sterile gloves were used for the procedure.  Under sonographic guidance, a in 19 gauge needle was inserted into the common femoral artery and removed over a Bentson. A 5 French sheath was inserted. A cobra 2 catheter was advanced over the Bentson wire and into the celiac axis. Contrast was injected. It was advanced over a glidewire into the common hepatic artery and angiography was performed. The catheter was advanced into the gastroduodenal artery and repeat imaging was performed.  The catheter was retracted and advanced over a Bentson wire into the SMA. Angiography was performed in the AP and bilateral oblique views. The cobra catheter was removed. Femoral angiography was performed. Exo seal was deployed for hemostasis without complication  COMPLICATIONS: None  FINDINGS: Injection of the celiac axis demonstrates vascular anatomy and no source of gastrointestinal bleeding. Specifically, injection of the gastroduodenal artery demonstrates no active extravasation of contrast or pseudoaneurysm. Of note, the endoscopic clips within the distal duodenum are apparently beyond the vascular distribution of the gastroduodenal artery.  Injection of the SMA demonstrates no source of active gastrointestinal bleeding. Jejunal branches leading towards the duodenal clips or noted. Embolization was not performed.  IMPRESSION: Gastrointestinal angiography demonstrates no source of bleeding. Both the celiac and SMA were injected. The region of the bleeding noted on endoscopy was beyond the distribution of the gastroduodenal artery, therefore empiric embolization was not performed. The source vessel also not be identified from the SMA injection.   Electronically Signed   By: Maryclare Bean M.D.   On: 09/27/2013 13:21   Dg Chest Port 1 View  09/25/2013   CLINICAL DATA:  Central line placement.  EXAM: PORTABLE CHEST - 1 VIEW  COMPARISON:  Chest radiograph performed 09/23/2013  FINDINGS: The patient's  right  IJ line is noted ending about the mid to distal SVC. The enteric tube is noted extending below the diaphragm.  The lungs are hypoexpanded. Mild bibasilar atelectasis is noted. No pleural effusion or pneumothorax is seen.  The cardiomediastinal silhouette is normal in size. No acute osseous abnormalities are identified.  IMPRESSION: 1. Right IJ line noted ending about the mid to distal SVC. 2. Lungs hypoexpanded, with mild bibasilar atelectasis.   Electronically Signed   By: Roanna Raider M.D.   On: 09/25/2013 00:17   Dg Abd Portable 1v  09/26/2013   CLINICAL DATA:  Abdominal pain  EXAM: PORTABLE ABDOMEN - 1 VIEW  COMPARISON:  Abdominal films of September 25, 2013.  FINDINGS: The bowel gas pattern does not suggest obstruction or ileus. No abnormal extraluminal gas collections are demonstrated. Portions of the left aspect of the abdomen are excluded from the field-of-view. There is radiodense material in the region of the urinary bladder likely from the previous CT scan. There are surgical clips overlying the right and left L2 transverse processes.  IMPRESSION: There is no evidence of bowel obstruction or ileus. A three-way abdominal series may be useful if further plain film imaging is desired.   Electronically Signed   By: David  [place]   On: 09/26/2013 11:05   Dg Abd Portable 1v  09/25/2013   CLINICAL DATA:  Gastric bleeding. Concern for small bowel obstruction.  EXAM: PORTABLE ABDOMEN - 1 VIEW  COMPARISON:  CT of the abdomen and pelvis performed 09/23/2013  FINDINGS: The visualized bowel gas pattern is grossly unremarkable. There is no evidence of bowel obstruction. Known active bleeding at the duodenum is not characterized on radiograph. The patient's enteric tube is noted overlying the body of the stomach. No free intra-abdominal air is identified, though evaluation for free air is suboptimal on supine views.  No acute osseous abnormalities are seen. Minimal degenerative change is noted along the  lumbar spine. The patient's right hip prosthesis is grossly unremarkable in appearance, though incompletely imaged. Contrast is seen filling the bladder.  IMPRESSION: Unremarkable bowel gas pattern; no free intra-abdominal air seen.   Electronically Signed   By: Roanna Raider M.D.   On: 09/25/2013 02:33   Ct Angio Abd/pel W/ And/or W/o  09/25/2013   CLINICAL DATA:  Upper GI bleed; bright red blood from nasogastric tube.  EXAM: CT ANGIOGRAPHY ABDOMEN AND PELVIS WITH CONTRAST AND WITHOUT CONTRAST  TECHNIQUE: Multidetector CT imaging of the abdomen and pelvis was performed using the standard protocol during bolus administration of intravenous contrast. Multiplanar reconstructed images including MIPs were obtained and reviewed to evaluate the vascular anatomy.  CONTRAST:  OMNIPAQUE IOHEXOL 350 MG/ML SOLN  COMPARISON:  CT of the abdomen and pelvis performed 09/23/2013  FINDINGS: Trace bilateral pleural effusions are seen, left greater than right, with bibasilar airspace opacification likely reflecting atelectasis.  The known focus of acute duodenal hemorrhage is better characterized on the prior study, as arterial phase images remain too early to visualize active contrast extravasation at the duodenum. As before, there is apparent wall thickening at the second segment of the duodenum; this may reflect an underlying mass or a bleeding duodenal ulcer and associated duodenitis. Mildly heterogeneous blood is again seen distending the duodenum, with the duodenum measuring up to 5.0 cm in diameter. This appearance is grossly unchanged from the prior study.  The patient's enteric tube is seen ending at the body of the stomach. The stomach is largely decompressed, aside from a small amount of high  attenuation fluid in the antrum, likely reflecting blood tracking from the duodenum.  The nodular contour of the liver is compatible with cirrhotic change. Minimal residual contrast is noted at the Phrygian cap of the  gallbladder. The spleen is diminutive, with a few calcified granulomata. There is mild diffuse soft tissue stranding about the pancreas, more prominent than on the prior study. This may reflect the duodenal process, though would correlate with lipase levels. Peripancreatic nodes remain stable in size. The adrenal glands are unremarkable in appearance.  Mild calcification is noted along the abdominal aorta and its branches. There is slight ectasia of the distal abdominal aorta, without evidence of aneurysmal dilatation. The celiac trunk, superior mesenteric artery, bilateral renal arteries and inferior mesenteric artery remain patent. The inferior vena cava is grossly unremarkable in appearance.  Nonspecific perinephric stranding is noted bilaterally. Scarring is noted near the upper pole of the left kidney. Small bilateral renal cysts are seen. The kidneys are otherwise unremarkable. There is no evidence of hydronephrosis. No renal or ureteral stones are seen.  No free fluid is identified. The small bowel is unremarkable in appearance. The stomach is within normal limits. No acute vascular abnormalities are seen.  The appendix is grossly unremarkable in appearance, without evidence for appendicitis. Scattered diverticulosis is noted along the entirety of the colon, without evidence of diverticulitis.  The bladder is mildly distended and grossly unremarkable in appearance. Mildly increased attenuation within the bladder may reflect residual contrast from the prior study. The prostate remains normal in size, with scattered calcification. No inguinal lymphadenopathy is seen.  No acute osseous abnormalities are identified. The patient's right hip prosthesis is incompletely imaged but appears grossly unremarkable.  Review of the MIP images confirms the above findings.  IMPRESSION: 1. Known focus of acute duodenal hemorrhage is better characterized on the prior study, as arterial phase images remain too early to visualize  active contrast extravasation at the duodenum. As before, there is apparent wall thickening of the second segment of the duodenum. This may reflect an underlying mass or bleeding duodenal ulcer, and associated duodenitis. Mildly heterogeneous blood again seen distending the duodenum and tracking back into the gastric antrum. The duodenum measures up to 5.0 cm in diameter, relatively stable from the prior study. 2. Mildly increased diffuse soft tissue stranding about the pancreas. This may reflect the duodenal process, though would correlate with lipase levels to exclude mild pancreatitis. 3. Trace bilateral pleural effusions, left greater than right, with bibasilar airspace opacification likely reflecting atelectasis. 4. Changes of hepatic cirrhosis noted. 5. Mild calcification along the abdominal aorta and its branches. 6. Small bilateral renal cysts; scarring near the upper pole of the left kidney. 7. Scattered diverticulosis along the entirety of the colon, without evidence of diverticulitis.   Electronically Signed   By: Roanna Raider M.D.   On: 09/25/2013 03:59    CBC  Recent Labs Lab 09/26/13 2026 09/27/13 0215 09/28/13 0500 09/29/13 0500 09/30/13 0735  WBC 11.0* 10.1 10.3 16.5* 11.0*  HGB 9.2* 9.2* 9.0* 8.5* 8.0*  HCT 27.7* 27.1* 26.5* 25.1* 24.0*  PLT 224 213 253 282 328  MCV 91.1 90.6 91.7 91.9 92.7  MCH 30.3 30.8 31.1 31.1 30.9  MCHC 33.2 33.9 34.0 33.9 33.3  RDW 17.1* 17.0* 16.9* 16.4* 16.3*    Chemistries   Recent Labs Lab 09/24/13 0415 09/25/13 0155 09/25/13 0226 09/26/13 0500 09/27/13 0215 09/29/13 0500  NA 144  --  142 145 140 139  K 4.1  --  3.3* 3.6 3.5 3.9  CL 103  --  110 115* 110 106  CO2 26  --  23 24 21 21   GLUCOSE 123*  --  141* 104* 99 111*  BUN 37*  --  49* 48* 32* 22  CREATININE 1.28  --  1.33 1.42* 1.15 1.05  CALCIUM 8.5  --  7.2* 7.2* 7.6* 8.0*  MG 1.4* 1.9  --   --  2.3  --   AST  --   --   --   --   --  28  ALT  --   --   --   --   --  12   ALKPHOS  --   --   --   --   --  40  BILITOT  --   --   --   --   --  0.6   ------------------------------------------------------------------------------------------------------------------ estimated creatinine clearance is 49.6 ml/min (by C-G formula based on Cr of 1.05). ------------------------------------------------------------------------------------------------------------------ No results found for this basename: HGBA1C,  in the last 72 hours ------------------------------------------------------------------------------------------------------------------  Recent Labs  09/29/13 0500  CHOL 100  HDL 24*  LDLCALC 53  TRIG 952  CHOLHDL 4.2   ------------------------------------------------------------------------------------------------------------------ No results found for this basename: TSH, T4TOTAL, FREET3, T3FREE, THYROIDAB,  in the last 72 hours ------------------------------------------------------------------------------------------------------------------ No results found for this basename: VITAMINB12, FOLATE, FERRITIN, TIBC, IRON, RETICCTPCT,  in the last 72 hours  Coagulation profile  Recent Labs Lab 09/25/13 0030 09/26/13 1345  INR 1.31 1.37    No results found for this basename: DDIMER,  in the last 72 hours  Cardiac Enzymes  Recent Labs Lab 09/24/13 0135  TROPONINI <0.30   ------------------------------------------------------------------------------------------------------------------ No components found with this basename: POCBNP,

## 2013-09-30 NOTE — Evaluation (Signed)
Physical Therapy Evaluation Patient Details Name: [name] MRN: [id number] DOB: November 02, 1923 Today's Date: 09/30/2013 Time: 7829-5621 PT Time Calculation (min): 20 min  PT Assessment / Plan / Recommendation History of Present Illness  80 y.o. admitted with GI bleed.  Clinical Impression  Pt very motivated to ambulate and return to PLOF.  Will continue to follow.      PT Assessment  Patient needs continued PT services    Follow Up Recommendations  Home health PT (at [facility])    Does the patient have the potential to tolerate intense rehabilitation      Barriers to Discharge        Equipment Recommendations  None recommended by PT    Recommendations for Other Services     Frequency Min 3X/week    Precautions / Restrictions Precautions Precautions: Fall Restrictions Weight Bearing Restrictions: No   Pertinent Vitals/Pain Deneid pain.        Mobility  Bed Mobility Bed Mobility: Not assessed Supine to Sit: 5: Supervision Transfers Transfers: Sit to Stand;Stand to Sit Sit to Stand: 4: Min guard;With upper extremity assist;From chair/3-in-1 Stand to Sit: 4: Min guard;With upper extremity assist;To chair/3-in-1 Details for Transfer Assistance: cues for UE use.   Ambulation/Gait Ambulation/Gait Assistance: 4: Min guard Ambulation Distance (Feet): 300 Feet Assistive device: Rolling walker Ambulation/Gait Assistance Details: cues for positioning within RW.   Gait Pattern: Step-through pattern;Decreased stride length Stairs: No Wheelchair Mobility Wheelchair Mobility: No    Exercises     PT Diagnosis: Difficulty walking  PT Problem List: Decreased activity tolerance;Decreased balance;Decreased mobility;Decreased knowledge of use of DME PT Treatment Interventions: DME instruction;Gait training;Functional mobility training;Therapeutic activities;Therapeutic exercise;Balance training;Patient/family education     PT Goals(Current goals can be found in  the care plan section) Acute Rehab PT Goals Patient Stated Goal: get back to what he was doing before PT Goal Formulation: With patient Time For Goal Achievement: 10/14/13 Potential to Achieve Goals: Good  Visit Information  Last PT Received On: 09/30/13 Assistance Needed: +1 History of Present Illness: 79 y.o. admitted with GI bleed.       Prior Functioning  Home Living Family/patient expects to be discharged to:: Other (Comment) ([name]) Living Arrangements: Alone Type of Home: Independent living facility Home Access: Level entry Home Layout: One level Home Equipment: Walker - 2 wheels;Cane - single point;Grab bars - tub/shower;Grab bars - toilet;Shower seat Prior Function Level of Independence: Independent with assistive device(s) Communication Communication: No difficulties    Cognition  Cognition Arousal/Alertness: Awake/alert Behavior During Therapy: WFL for tasks assessed/performed Overall Cognitive Status: Within Functional Limits for tasks assessed    Extremity/Trunk Assessment Upper Extremity Assessment Upper Extremity Assessment: Defer to OT evaluation Lower Extremity Assessment Lower Extremity Assessment: Overall WFL for tasks assessed   Balance Balance Balance Assessed: No  End of Session PT - End of Session Equipment Utilized During Treatment: Gait belt Activity Tolerance: Patient tolerated treatment well Patient left: in chair;with call bell/phone within reach;with family/visitor present Nurse Communication: Mobility status  GP     Sunny Schlein, [place] [phone] 09/30/2013, 2:29 PM

## 2013-09-30 NOTE — Procedures (Signed)
Objective Swallowing Evaluation: Fiberoptic Endoscopic Evaluation of Swallowing  Patient Details  Name: [name] MRN: [id number] Date of Birth: 20-Apr-1924  Today's Date: 09/30/2013 Time: 1020-1105 SLP Time Calculation (min): 45 min  Past Medical History:  Past Medical History  Diagnosis Date  . Hyperlipemia   . CVA (cerebral infarction)   . Hypertension   . Stroke     2011 no deficits   . GERD (gastroesophageal reflux disease)     hx of   . Arthritis   . Ulcer     stomach 2011 related to plavix   . Hx of transfusion of packed red blood cells   . Diabetes mellitus without complication     Diet controlled   Past Surgical History:  Past Surgical History  Procedure Laterality Date  . Total hip arthroplasty Right 04/28/2013    Procedure: RIGHT TOTAL HIP ARTHROPLASTY ANTERIOR APPROACH;  Surgeon: [name], MD;  Location: [facility] ORS;  Service: Orthopedics;  Laterality: Right;  . Esophagogastroduodenoscopy N/A 09/23/2013    Procedure: ESOPHAGOGASTRODUODENOSCOPY (EGD);  Surgeon: Hart Carwin, MD;  Location: [facility] ENDOSCOPY;  Service: Endoscopy;  Laterality: N/A;  . Esophagogastroduodenoscopy N/A 09/25/2013    Procedure: ESOPHAGOGASTRODUODENOSCOPY (EGD);  Surgeon: Theda Belfast, MD;  Location: [facility] ENDOSCOPY;  Service: Endoscopy;  Laterality: N/A;   HPI:  79 year old male with PMH of CVA and GERD, initially admitted by the critical care service after presenting with signs consistent with acute GI bleeding and was also hypotensive and required pressors. CT of the abdomen done at time of admission revealed a gastric outlet obstruction with marked distention of the stomach and proximal duodenum with contrast extravasation into the distal duodenum. There was also associated peripancreatic stranding in the area near the duodenum. Patient underwent an initial EGD on 09/23/2013 that revealed esophagitis and multiple shallow duodenal ulcerations. A second EGD was completed on 09/25/2013 which  revealed a massive duodenal ulcer with a visible vessel which was clipped. The patient subsequently was evaluated by IR for angiography and possible embolization in setting of ongoing active bleeding. There was also concern that the patient may have been experiencing mesenteric ischemia but an angiogram revealed patent SMA and celiac branches with no source of GI bleeding defined. Patient eventually stabilized from a hemodynamic standpoint. Hemoglobin has remained stable.      Assessment / Plan / Recommendation Clinical Impression  Dysphagia Diagnosis: Suspected primary esophageal dysphagia;Moderate pharyngeal phase dysphagia Clinical impression: Patient presents with a moderate dysphagia with pharyngeal and suspected esophageal components. Initial swallow appearing relatively age appropriate with an intermittently delayed swallow initiation but with full airway protection. Post swallow however, patient with mild pharyngeal residuals and question of some retrograde flow of bolus from esophagus although difficult to view with this study, which result in penetration of liquids consistencies. SLP provided cueing for use of chin tuck which did not impact results. Cues for multiple swallows moderately successful to clear residue and prevent penetration. Luckily, patient with consistently intact sensation and responds with strong cough which is successful to clear the airway. Although risk of aspiration moderately high, patient compensating reasonably well and cognitively intact for carryout of precautions. Recommend continuation of current diet with aspiration precautions. Recommend proceeding with MBS in am 10/01/13 as this SLP suspects some degree of esophageal dysphagia which can be better visualized under fluoro. Thorough education complete with patient and daugther who are in agreement with plan and recommendations.    Treatment Recommendation   (defer until completion of MBS)  Diet Recommendation Thin  liquid (full liquids)   Liquid Administration via: Cup Medication Administration: Crushed with puree Supervision: Patient able to self feed;Full supervision/cueing for compensatory strategies Compensations: Slow rate;Small sips/bites;Multiple dry swallows after each bite/sip;Hard cough after swallow Postural Changes and/or Swallow Maneuvers: Seated upright 90 degrees;Upright 30-60 min after meal    Other  Recommendations Recommended Consults: FEES Oral Care Recommendations: Oral care BID   Follow Up Recommendations   (TBD)            General HPI: 79 year old male with PMH of CVA and GERD, initially admitted by the critical care service after presenting with signs consistent with acute GI bleeding and was also hypotensive and required pressors. CT of the abdomen done at time of admission revealed a gastric outlet obstruction with marked distention of the stomach and proximal duodenum with contrast extravasation into the distal duodenum. There was also associated peripancreatic stranding in the area near the duodenum. Patient underwent an initial EGD on 09/23/2013 that revealed esophagitis and multiple shallow duodenal ulcerations. A second EGD was completed on 09/25/2013 which revealed a massive duodenal ulcer with a visible vessel which was clipped. The patient subsequently was evaluated by IR for angiography and possible embolization in setting of ongoing active bleeding. There was also concern that the patient may have been experiencing mesenteric ischemia but an angiogram revealed patent SMA and celiac branches with no source of GI bleeding defined. Patient eventually stabilized from a hemodynamic standpoint. Hemoglobin has remained stable.  Type of Study: Fiberoptic Endoscopic Evaluation of Swallowing Reason for Referral: Objectively evaluate swallowing function Previous Swallow Assessment: none Diet Prior to this Study: Thin liquids (full liquid) Temperature Spikes Noted: No Respiratory  Status: Room air History of Recent Intubation: No Behavior/Cognition: Alert;Cooperative;Pleasant mood Oral Cavity - Dentition: Adequate natural dentition Oral Motor / Sensory Function: Within functional limits Self-Feeding Abilities: Able to feed self Patient Positioning: Upright in chair Baseline Vocal Quality: Clear Volitional Cough: Strong Volitional Swallow: Able to elicit Anatomy: Within functional limits Pharyngeal Secretions: Standing secretions in (comment) (pyriform sinuses, eliciting cough response)    Reason for Referral Objectively evaluate swallowing function   Oral Phase Oral Preparation/Oral Phase Oral Phase: WFL   Pharyngeal Phase Pharyngeal Phase Pharyngeal Phase: Impaired Pharyngeal - Nectar Pharyngeal - Nectar Teaspoon: Delayed swallow initiation;Premature spillage to valleculae;Pharyngeal residue - valleculae;Pharyngeal residue - pyriform sinuses;Penetration/Aspiration after swallow;Pharyngeal residue - cp segment Penetration/Aspiration details (nectar teaspoon): Material enters airway, CONTACTS cords then ejected out Pharyngeal - Nectar Cup: Delayed swallow initiation;Premature spillage to valleculae;Pharyngeal residue - valleculae;Pharyngeal residue - pyriform sinuses;Penetration/Aspiration after swallow;Pharyngeal residue - cp segment Penetration/Aspiration details (nectar cup): Material enters airway, CONTACTS cords then ejected out Pharyngeal - Thin Pharyngeal - Thin Cup: Delayed swallow initiation;Premature spillage to valleculae;Pharyngeal residue - valleculae;Pharyngeal residue - pyriform sinuses;Penetration/Aspiration after swallow;Pharyngeal residue - cp segment Penetration/Aspiration details (thin cup): Material enters airway, CONTACTS cords then ejected out Pharyngeal - Thin Straw: Delayed swallow initiation;Pharyngeal residue - valleculae;Pharyngeal residue - pyriform sinuses;Penetration/Aspiration after swallow;Pharyngeal residue - cp segment;Premature  spillage to valleculae;Premature spillage to pyriform sinuses Penetration/Aspiration details (thin straw): Material enters airway, CONTACTS cords then ejected out Pharyngeal - Solids Pharyngeal - Puree: Delayed swallow initiation;Premature spillage to valleculae;Pharyngeal residue - valleculae;Pharyngeal residue - pyriform sinuses Pharyngeal - Mechanical Soft: Delayed swallow initiation;Premature spillage to valleculae;Pharyngeal residue - valleculae;Pharyngeal residue - pyriform sinuses;Pharyngeal residue - cp segment  Cervical Esophageal Phase    GO  Nakema Fake MA, CCC-SLP [phone]   Cervical Esophageal Phase Cervical Esophageal Phase:  (unable to visualize,  suspect esophageal component)         Isaia Hassell [place] 09/30/2013, 11:31 AM

## 2013-09-30 NOTE — Evaluation (Signed)
Clinical/Bedside Swallow Evaluation Patient Details  Name: [name] MRN: [id number] Date of Birth: 10-30-1923  Today's Date: 09/30/2013 Time: 4098-1191 SLP Time Calculation (min): 20 min  Past Medical History:  Past Medical History  Diagnosis Date  . Hyperlipemia   . CVA (cerebral infarction)   . Hypertension   . Stroke     2011 no deficits   . GERD (gastroesophageal reflux disease)     hx of   . Arthritis   . Ulcer     stomach 2011 related to plavix   . Hx of transfusion of packed red blood cells   . Diabetes mellitus without complication     Diet controlled   Past Surgical History:  Past Surgical History  Procedure Laterality Date  . Total hip arthroplasty Right 04/28/2013    Procedure: RIGHT TOTAL HIP ARTHROPLASTY ANTERIOR APPROACH;  Surgeon: [name], MD;  Location: [facility];  Service: Orthopedics;  Laterality: Right;  . Esophagogastroduodenoscopy N/A 09/23/2013    Procedure: ESOPHAGOGASTRODUODENOSCOPY (EGD);  Surgeon: Hart Carwin, MD;  Location: [facility] ENDOSCOPY;  Service: Endoscopy;  Laterality: N/A;  . Esophagogastroduodenoscopy N/A 09/25/2013    Procedure: ESOPHAGOGASTRODUODENOSCOPY (EGD);  Surgeon: Theda Belfast, MD;  Location: [facility] ENDOSCOPY;  Service: Endoscopy;  Laterality: N/A;   HPI:  79 year old male with PMH of CVA and GERD, initially admitted by the critical care service after presenting with signs consistent with acute GI bleeding and was also hypotensive and required pressors. CT of the abdomen done at time of admission revealed a gastric outlet obstruction with marked distention of the stomach and proximal duodenum with contrast extravasation into the distal duodenum. There was also associated peripancreatic stranding in the area near the duodenum. Patient underwent an initial EGD on 09/23/2013 that revealed esophagitis and multiple shallow duodenal ulcerations. A second EGD was completed on 09/25/2013 which revealed a massive duodenal ulcer with a visible  vessel which was clipped. The patient subsequently was evaluated by IR for angiography and possible embolization in setting of ongoing active bleeding. There was also concern that the patient may have been experiencing mesenteric ischemia but an angiogram revealed patent SMA and celiac branches with no source of GI bleeding defined. Patient eventually stabilized from a hemodynamic standpoint. Hemoglobin has remained stable.    Assessment / Plan / Recommendation Clinical Impression  Patient presents with delayed s/s of decreased airway protection s/p swallow with all consistencies tested (greatest with thin liquids) suggestive of either a build up of pharyngeal residuals or esophageal component with backflow of bolus post swallow. Favor esophageal dysphagia given admission diagnosis. Educdation complete with patient and family regarding results of test, diet recommendations, compensatory strategies, and plan for FEES. Unable to complete MBS this date due to xray scheduling. Recommend proceeding with FEES this pm to establish least restrictive diet.     Aspiration Risk  Moderate    Diet Recommendation Thin liquid (full liquids)   Liquid Administration via: Cup;Straw Medication Administration: Whole meds with liquid Supervision: Patient able to self feed;Full supervision/cueing for compensatory strategies Compensations: Slow rate;Small sips/bites Postural Changes and/or Swallow Maneuvers: Seated upright 90 degrees;Upright 30-60 min after meal    Other  Recommendations Recommended Consults: FEES Oral Care Recommendations: Oral care BID   Follow Up Recommendations   (TBD)    Frequency and Duration        Pertinent Vitals/Pain n/a     Swallow Study    General HPI: 79 year old male with PMH of CVA and GERD, initially admitted by the critical  care service after presenting with signs consistent with acute GI bleeding and was also hypotensive and required pressors. CT of the abdomen done at time  of admission revealed a gastric outlet obstruction with marked distention of the stomach and proximal duodenum with contrast extravasation into the distal duodenum. There was also associated peripancreatic stranding in the area near the duodenum. Patient underwent an initial EGD on 09/23/2013 that revealed esophagitis and multiple shallow duodenal ulcerations. A second EGD was completed on 09/25/2013 which revealed a massive duodenal ulcer with a visible vessel which was clipped. The patient subsequently was evaluated by IR for angiography and possible embolization in setting of ongoing active bleeding. There was also concern that the patient may have been experiencing mesenteric ischemia but an angiogram revealed patent SMA and celiac branches with no source of GI bleeding defined. Patient eventually stabilized from a hemodynamic standpoint. Hemoglobin has remained stable.  Type of Study: Bedside swallow evaluation Previous Swallow Assessment: none Diet Prior to this Study: Thin liquids (full liquids) Temperature Spikes Noted: No Respiratory Status: Room air History of Recent Intubation: No Behavior/Cognition: Alert;Cooperative;Pleasant mood Oral Cavity - Dentition: Adequate natural dentition Self-Feeding Abilities: Able to feed self Patient Positioning: Upright in chair Baseline Vocal Quality: Clear Volitional Cough: Strong Volitional Swallow: Able to elicit    Oral/Motor/Sensory Function Overall Oral Motor/Sensory Function: Appears within functional limits for tasks assessed   Ice Chips Ice chips: Not tested   Thin Liquid Thin Liquid: Impaired Presentation: Cup;Straw;Self Fed Pharyngeal  Phase Impairments: Cough - Immediate;Cough - Delayed;Throat Clearing - Immediate;Throat Clearing - Delayed;Wet Vocal Quality    Nectar Thick Nectar Thick Liquid: Not tested   Honey Thick Honey Thick Liquid: Not tested   Puree Puree: Impaired Presentation: Self Fed;Spoon Pharyngeal Phase Impairments: Cough  - Delayed;Wet Vocal Quality (delayed wet vocal quality)   Solid   GO   Lynnwood Beckford MA, CCC-SLP [phone]  Solid: Not tested       Laelah Siravo Meryl 09/30/2013,11:22 AM

## 2013-09-30 NOTE — Evaluation (Signed)
Occupational Therapy Evaluation Patient Details Name: [name] MRN: [id number] DOB: [birth year] Today's Date: 09/30/2013 Time: 6295-2841 OT Time Calculation (min): 21 min  OT Assessment / Plan / Recommendation History of present illness 79 y.o. admitted with GI bleed.   Clinical Impression   Pt presents with below problem list. Pt independent with ADLs, PTA. Pt will benefit from acute OT to increase independence prior to d/c.     OT Assessment  Patient needs continued OT Services    Follow Up Recommendations  Home health OT;Supervision/Assistance - 24 hour    Barriers to Discharge      Equipment Recommendations  None recommended by OT    Recommendations for Other Services    Frequency  Min 2X/week    Precautions / Restrictions Precautions Precautions: Fall Restrictions Weight Bearing Restrictions: No   Pertinent Vitals/Pain No pain reported.     ADL  Grooming: Teeth care;Supervision/safety;Set up Where Assessed - Grooming: Supported standing Upper Body Dressing: Set up Where Assessed - Upper Body Dressing: Supported sitting Lower Body Dressing: Minimal assistance Where Assessed - Lower Body Dressing: Supported sit to Pharmacist, hospital: Hydrographic surveyor Method: Sit to Barista: Comfort height toilet;Grab bars Toileting - Architect and Hygiene: Min guard Where Assessed - Engineer, mining and Hygiene: Standing Tub/Shower Transfer Method: Not assessed Equipment Used: Gait belt;Rolling walker Transfers/Ambulation Related to ADLs: Min guard  ADL Comments: Educated that there is equipment to help with donning Rt sock as pt could not don it-pt thinks it is because he is stiff right now. Educated on dressing technique.  Educated on energy conservation techniques.    OT Diagnosis: Acute pain;Generalized weakness  OT Problem List: Decreased strength;Decreased range of motion;Decreased activity  tolerance;Impaired balance (sitting and/or standing);Decreased knowledge of precautions;Decreased knowledge of use of DME or AE OT Treatment Interventions: Self-care/ADL training;Therapeutic exercise;DME and/or AE instruction;Therapeutic activities;Patient/family education;Balance training   OT Goals(Current goals can be found in the care plan section) Acute Rehab OT Goals Patient Stated Goal: get back to what he was doing before OT Goal Formulation: With patient Time For Goal Achievement: 10/07/13 Potential to Achieve Goals: Good ADL Goals Pt Will Perform Lower Body Bathing: with modified independence;sit to/from stand Pt Will Perform Lower Body Dressing: with modified independence;sit to/from stand Pt Will Transfer to Toilet: with modified independence;ambulating;grab bars (comfort height toilet) Pt Will Perform Toileting - Clothing Manipulation and hygiene: with modified independence;sit to/from stand Pt Will Perform Tub/Shower Transfer: Shower transfer;with supervision;ambulating;shower seat;rolling walker  Visit Information  Last OT Received On: 09/30/13 Assistance Needed: +1 History of Present Illness: 79 y.o. admitted with GI bleed.       Prior Functioning     Home Living Family/patient expects to be discharged to:: Other (Comment) ([name]) Living Arrangements: Alone Type of Home: Independent living facility Home Access: Level entry Home Layout: One level Home Equipment: Walker - 2 wheels;Cane - single point;Grab bars - tub/shower;Grab bars - toilet;Shower seat Prior Function Level of Independence: Independent with assistive device(s) Communication Communication: No difficulties         Vision/Perception     Cognition  Cognition Arousal/Alertness: Awake/alert Behavior During Therapy: WFL for tasks assessed/performed Overall Cognitive Status: Within Functional Limits for tasks assessed    Extremity/Trunk Assessment Upper Extremity  Assessment Upper Extremity Assessment: Overall WFL for tasks assessed Lower Extremity Assessment Lower Extremity Assessment: Defer to PT evaluation     Mobility Bed Mobility Bed Mobility: Supine to Sit Supine to Sit: 5: Supervision  Transfers Transfers: Sit to Stand;Stand to Sit Sit to Stand: 4: Min guard;With upper extremity assist;From bed;From toilet Stand to Sit: 4: Min guard;To chair/3-in-1;To toilet Details for Transfer Assistance: Cues for technique.     Exercise     Balance     End of Session OT - End of Session Equipment Utilized During Treatment: Gait belt;Rolling walker Activity Tolerance: Patient tolerated treatment well Patient left: in chair;with call bell/phone within reach Nurse Communication: Other (comment) (called tech about pt's dirty bandage and he was up in chair)  GO     Earlie Raveling OTR/L [phone] 09/30/2013, 12:35 PM

## 2013-10-01 ENCOUNTER — Inpatient Hospital Stay (HOSPITAL_COMMUNITY): Payer: Medicare Other

## 2013-10-01 DIAGNOSIS — D62 Acute posthemorrhagic anemia: Secondary | ICD-10-CM

## 2013-10-01 LAB — CBC
HCT: 22.1 % — ABNORMAL LOW (ref 39.0–52.0)
HCT: 25.5 % — ABNORMAL LOW (ref 39.0–52.0)
Hemoglobin: 7.6 g/dL — ABNORMAL LOW (ref 13.0–17.0)
Hemoglobin: 9.1 g/dL — ABNORMAL LOW (ref 13.0–17.0)
MCH: 31.4 pg (ref 26.0–34.0)
MCH: 31.9 pg (ref 26.0–34.0)
MCHC: 34.4 g/dL (ref 30.0–36.0)
MCHC: 35.7 g/dL (ref 30.0–36.0)
MCV: 91.3 fL (ref 78.0–100.0)
MCV: 89.5 fL (ref 78.0–100.0)
Platelets: 406 10*3/uL — ABNORMAL HIGH (ref 150–400)
Platelets: 423 10*3/uL — ABNORMAL HIGH (ref 150–400)
RBC: 2.42 MIL/uL — ABNORMAL LOW (ref 4.22–5.81)
RBC: 2.85 MIL/uL — ABNORMAL LOW (ref 4.22–5.81)
RDW: 16.3 % — ABNORMAL HIGH (ref 11.5–15.5)
RDW: 16.4 % — ABNORMAL HIGH (ref 11.5–15.5)
WBC: 8.2 10*3/uL (ref 4.0–10.5)
WBC: 8.2 10*3/uL (ref 4.0–10.5)

## 2013-10-01 LAB — BASIC METABOLIC PANEL
BUN: 21 mg/dL (ref 6–23)
CO2: 21 mEq/L (ref 19–32)
Calcium: 8.5 mg/dL (ref 8.4–10.5)
Chloride: 108 mEq/L (ref 96–112)
Creatinine, Ser: 1.02 mg/dL (ref 0.50–1.35)
GFR calc Af Amer: 74 mL/min — ABNORMAL LOW (ref >90)
GFR calc non Af Amer: 64 mL/min — ABNORMAL LOW (ref >90)
Glucose, Bld: 179 mg/dL — ABNORMAL HIGH (ref 70–99)
Potassium: 4 mEq/L (ref 3.7–5.3)
Sodium: 140 mEq/L (ref 137–147)

## 2013-10-01 LAB — GLUCOSE, CAPILLARY
Glucose-Capillary: 118 mg/dL — ABNORMAL HIGH (ref 70–99)
Glucose-Capillary: 109 mg/dL — ABNORMAL HIGH (ref 70–99)
Glucose-Capillary: 118 mg/dL — ABNORMAL HIGH (ref 70–99)
Glucose-Capillary: 105 mg/dL — ABNORMAL HIGH (ref 70–99)

## 2013-10-01 LAB — PREPARE RBC (CROSSMATCH)

## 2013-10-01 MED ORDER — PANTOPRAZOLE SODIUM 40 MG IV SOLR
40.0000 mg | Freq: Two times a day (BID) | INTRAVENOUS | Status: DC
  Administered 2013-10-01 – 2013-10-03 (×5): 40 mg via INTRAVENOUS
  Filled 2013-10-01 (×7): qty 40

## 2013-10-01 NOTE — Procedures (Signed)
Objective Swallowing Evaluation: Modified Barium Swallowing Study  Patient Details  Name: [name] MRN: [id number] Date of Birth: 23-Oct-1923  Today's Date: 10/01/2013 Time: 0930-0950 SLP Time Calculation (min): 20 min  Past Medical History:  Past Medical History  Diagnosis Date  . Hyperlipemia   . CVA (cerebral infarction)   . Hypertension   . Stroke     2011 no deficits   . GERD (gastroesophageal reflux disease)     hx of   . Arthritis   . Ulcer     stomach 2011 related to plavix   . Hx of transfusion of packed red blood cells   . Diabetes mellitus without complication     Diet controlled   Past Surgical History:  Past Surgical History  Procedure Laterality Date  . Total hip arthroplasty Right 04/28/2013    Procedure: RIGHT TOTAL HIP ARTHROPLASTY ANTERIOR APPROACH;  Surgeon: Mauri Pole, MD;  Location: [facility]S;  Service: Orthopedics;  Laterality: Right;  . Esophagogastroduodenoscopy N/A 09/23/2013    Procedure: ESOPHAGOGASTRODUODENOSCOPY (EGD);  Surgeon: Lafayette Dragon, MD;  Location: [facility] ENDOSCOPY;  Service: Endoscopy;  Laterality: N/A;  . Esophagogastroduodenoscopy N/A 09/25/2013    Procedure: ESOPHAGOGASTRODUODENOSCOPY (EGD);  Surgeon: Beryle Beams, MD;  Location: [facility] ENDOSCOPY;  Service: Endoscopy;  Laterality: N/A;   HPI:  80 year old male with PMH of CVA and GERD, initially admitted by the critical care service after presenting with signs consistent with acute GI bleeding and was also hypotensive and required pressors. CT of the abdomen done at time of admission revealed a gastric outlet obstruction with marked distention of the stomach and proximal duodenum with contrast extravasation into the distal duodenum. There was also associated peripancreatic stranding in the area near the duodenum. Patient underwent an initial EGD on 09/23/2013 that revealed esophagitis and multiple shallow duodenal ulcerations. A second EGD was completed on 09/25/2013 which revealed a massive  duodenal ulcer with a visible vessel which was clipped. The patient subsequently was evaluated by IR for angiography and possible embolization in setting of ongoing active bleeding. There was also concern that the patient may have been experiencing mesenteric ischemia but an angiogram revealed patent SMA and celiac branches with no source of GI bleeding defined. Patient eventually stabilized from a hemodynamic standpoint. Hemoglobin has remained stable.      Assessment / Plan / Recommendation Clinical Impression  Dysphagia Diagnosis: Mild pharyngeal phase dysphagia;Moderate cervical esophageal phase dysphagia Clinical impression: Patient presents with improving overall function compared to FEES complete 12/31. Patient presents with a mild pharyngeal  and a moderate cervical esophageal dysphagia. A delayed swallow initiation results in flash penetration of thin liquids only. No additional episodes of penetration or aspiration noted. A tight appearing UES (? due to combination of possible osteophytes (MD not present) and lower esophageal deficits given acute diagnosis) result in mild pharyngeal residuals post swallow (vallecular > pyriform sinus) which are cleared with moderate SLP cueing for second swallow per bite/sip. Occassional strong cough response noted despite protection of airway. Prognosis for continued improvement good with continued improvement in esophageal/GI function. Aspiration continues to be mild-moderate. Education complete with patient regarding results and recommendations. SLP will continue to f/u.     Treatment Recommendation  Therapy as outlined in treatment plan below    Diet Recommendation Thin liquid;Dysphagia 3 (Mechanical Soft) (full liquids)   Liquid Administration via: Cup;No straw Medication Administration: Whole meds with puree Supervision: Patient able to self feed;Full supervision/cueing for compensatory strategies Compensations: Slow rate;Small sips/bites;Multiple dry  swallows  after each bite/sip Postural Changes and/or Swallow Maneuvers: Seated upright 90 degrees;Upright 30-60 min after meal    Other  Recommendations Oral Care Recommendations: Oral care BID   Follow Up Recommendations  None    Frequency and Duration min 2x/week  2 weeks           General HPI: 80 year old male with PMH of CVA and GERD, initially admitted by the critical care service after presenting with signs consistent with acute GI bleeding and was also hypotensive and required pressors. CT of the abdomen done at time of admission revealed a gastric outlet obstruction with marked distention of the stomach and proximal duodenum with contrast extravasation into the distal duodenum. There was also associated peripancreatic stranding in the area near the duodenum. Patient underwent an initial EGD on 09/23/2013 that revealed esophagitis and multiple shallow duodenal ulcerations. A second EGD was completed on 09/25/2013 which revealed a massive duodenal ulcer with a visible vessel which was clipped. The patient subsequently was evaluated by IR for angiography and possible embolization in setting of ongoing active bleeding. There was also concern that the patient may have been experiencing mesenteric ischemia but an angiogram revealed patent SMA and celiac branches with no source of GI bleeding defined. Patient eventually stabilized from a hemodynamic standpoint. Hemoglobin has remained stable.  Type of Study: Modified Barium Swallowing Study Reason for Referral: Objectively evaluate swallowing function Previous Swallow Assessment: none Diet Prior to this Study: Thin liquids (full liquids) Temperature Spikes Noted: No Respiratory Status: Room air History of Recent Intubation: No Behavior/Cognition: Alert;Cooperative;Pleasant mood Oral Cavity - Dentition: Adequate natural dentition Oral Motor / Sensory Function: Within functional limits Self-Feeding Abilities: Able to feed self Patient  Positioning: Upright in chair Baseline Vocal Quality: Clear Volitional Cough: Strong Volitional Swallow: Able to elicit Anatomy: Within functional limits Pharyngeal Secretions: Standing secretions in (comment) (pyriform sinuses, eliciting cough response)    Reason for Referral Objectively evaluate swallowing function   Oral Phase Oral Preparation/Oral Phase Oral Phase: WFL   Pharyngeal Phase Pharyngeal Phase Pharyngeal Phase: Impaired Pharyngeal - Nectar Pharyngeal - Nectar Teaspoon: Not tested Pharyngeal - Nectar Cup: Not tested Pharyngeal - Thin Pharyngeal - Thin Cup: Pharyngeal residue - valleculae;Pharyngeal residue - pyriform sinuses;Pharyngeal residue - cp segment;Delayed swallow initiation;Premature spillage to pyriform sinuses;Penetration/Aspiration before swallow Penetration/Aspiration details (thin cup): Material enters airway, remains ABOVE vocal cords then ejected out Pharyngeal - Thin Straw: Delayed swallow initiation;Pharyngeal residue - valleculae;Pharyngeal residue - pyriform sinuses;Pharyngeal residue - cp segment;Premature spillage to pyriform sinuses;Penetration/Aspiration before swallow Penetration/Aspiration details (thin straw): Material enters airway, remains ABOVE vocal cords then ejected out Pharyngeal - Solids Pharyngeal - Puree: Delayed swallow initiation;Premature spillage to valleculae;Pharyngeal residue - valleculae Pharyngeal - Mechanical Soft: Delayed swallow initiation;Premature spillage to valleculae;Pharyngeal residue - valleculae Pharyngeal Phase - Comment Pharyngeal Comment: improving function from FEES 12/31  Cervical Esophageal Phase    GO    Cervical Esophageal Phase Cervical Esophageal Phase: Impaired Cervical Esophageal Phase - Thin Thin Cup: Reduced cricopharyngeal relaxation Thin Straw: Reduced cricopharyngeal relaxation Cervical Esophageal Phase - Solids Puree: Reduced cricopharyngeal relaxation Mechanical Soft: Reduced  cricopharyngeal relaxation Pill: Reduced cricopharyngeal relaxation Cervical Esophageal Phase - Comment Cervical Esophageal Comment: ? due to combination of possible osteophytes (MD not present) and lower esophageal deficits given acute diagnosis.         Gabriel Rainwater MA, CCC-SLP [phone]   Layann Bluett Meryl 10/01/2013, 9:59 AM

## 2013-10-01 NOTE — Progress Notes (Signed)
[facility]                                                                                Patient Demographics  [name], is a 80 y.o. male, DOB - 10/28/1923, [id number]  Admit date - 09/23/2013   Admitting Physician Rigoberto Noel, MD  Outpatient Primary MD for the patient is [name], MD  LOS - 8   Chief Complaint  Patient presents with  . Vomiting  . Chest Pain        Assessment & Plan    Active Problems:   Acute blood loss anemia   Overweight (BMI 25.0-29.9)   Acute duodenal ulcer with bleeding   Acute esophagitis   Fatty liver   Atrial fibrillation with RVR   Elevated lipase   HTN (hypertension)   AKI (acute kidney injury)   Diabetes mellitus, type 2  Acute blood loss anemia secondary to GIB -Hb currently 7.6, baseline appears to be 9.7 -has received 3 units PRBCs since admit, will order unit for transfusion -Will continue to monitor every 12 hours. -Will reconsult gastroenterology  Acute duodenal ulcer with bleeding/Acute esophagitis  -no further bleeding thus far, although patient is high-risk for rebleeding  -last melanotic stool 12/29  -H Pylori IgG normal- IgA 6.8 -Continue Protonix 40 mg twice a day -Will continue full liquid diet and advance as tolerated  New onset Atrial fibrillation with RVR  -currently rate controlled  -Likely secondary to acute blood loss anemia -Given patient's current risk of bleeding, patient is not an anticoagulation candidate -ECHO Sept 2014 with normal EF and grade 1 diastolic dysfunction with normal atria   Elevated lipase  -associated with peripancreatic stranding at duodenum so suspect acute inflammation from ulcers likely cause  -Lipid: 100/116/24/53  Cough  -? Due to GERD vs asp PNA in setting of recent GIB with prior NGT and EGD x 2  -normal CXR  -SLP evaluation- recommended dysphagia 3 diet with thin liquids  AKI (acute kidney injury)  -Resolved   HTN  -Controlled, will continue to  monitor  Diabetes mellitus, type 2  -current CBGs controlled, will continue insulin sliding scale with CBG monitoring-was not on meds at home   Overweight (BMI 25.0-29.9)   Fatty liver  -seen on [place]  -TG normal on lipid panel  Code Status: full Family Communication: Daughter at bedside Disposition Plan: Admitted, will continue to monitor  Procedures  EGD 09/23/2013, 09/25/2013 NG tube placed 09/23/2013, discontinued 09/25/2013  Consults   Gastroenterology Interventional radiology General surgery  DVT Prophylaxis SCDs  Lab Results  Component Value Date   PLT 406* 10/01/2013    Medications  Scheduled Meds: . insulin aspart  0-15 Units Subcutaneous TID WC  . metoCLOPramide (REGLAN) injection  10 mg Intravenous Q6H  . pantoprazole (PROTONIX) IV  40 mg Intravenous BID   Continuous Infusions: . sodium chloride Stopped (09/28/13 0800)   PRN Meds:.acetaminophen, bisacodyl, diphenhydrAMINE, HYDROcodone-homatropine, menthol-cetylpyridinium, ondansetron  Antibiotics    Anti-infectives   Start     Dose/Rate Route Frequency Ordered Stop   09/24/13 1200  metroNIDAZOLE (FLAGYL) IVPB 500 mg  Status:  Discontinued     500 mg 100 mL/hr over 60  Minutes Intravenous Every 8 hours 09/24/13 0753 09/27/13 0917   09/24/13 1000  ciprofloxacin (CIPRO) IVPB 400 mg  Status:  Discontinued     400 mg 200 mL/hr over 60 Minutes Intravenous Every 12 hours 09/24/13 0018 09/24/13 0744   09/24/13 1000  ciprofloxacin (CIPRO) IVPB 400 mg  Status:  Discontinued     400 mg 200 mL/hr over 60 Minutes Intravenous Every 12 hours 09/24/13 0748 09/27/13 0917   09/24/13 0800  metroNIDAZOLE (FLAGYL) IVPB 500 mg  Status:  Discontinued     500 mg 100 mL/hr over 60 Minutes Intravenous Every 8 hours 09/24/13 0747 09/24/13 0753   09/24/13 0800  metroNIDAZOLE (FLAGYL) IVPB 500 mg  Status:  Discontinued     500 mg 100 mL/hr over 60 Minutes Intravenous Every 8 hours 09/24/13 0748 09/24/13 0748   09/24/13 0600   metroNIDAZOLE (FLAGYL) IVPB 500 mg  Status:  Discontinued     500 mg 100 mL/hr over 60 Minutes Intravenous Every 8 hours 09/24/13 0014 09/24/13 0744   09/23/13 2100  ciprofloxacin (CIPRO) IVPB 400 mg     400 mg 200 mL/hr over 60 Minutes Intravenous  Once 09/23/13 2049 09/23/13 2337   09/23/13 2100  metroNIDAZOLE (FLAGYL) IVPB 500 mg     500 mg 100 mL/hr over 60 Minutes Intravenous  Once 09/23/13 2049 09/23/13 2336       Time Spent in minutes   30 minutes   Jaton Eilers D.O. on 10/01/2013 at 1:37 PM  Between 7am to 7pm - Pager - [phone]  After 7pm go to [email or web address] - password [email or web address]  And look for the night coverage person covering for me after hours  [facility] Office  [phone]    Subjective:   [name] seen and examined today.  Patient denies abdominal pain.  Still complains of cough.     Objective:   Filed Vitals:   09/30/13 2134 10/01/13 0625 10/01/13 0700 10/01/13 1330  BP: 120/52 128/61  140/97  Pulse: 99 100  103  Temp: 98.3 F (36.8 C) 98.4 F (36.9 C)  98.1 F (36.7 C)  TempSrc: Oral Oral  Oral  Resp: 18 18  18   Height:      Weight:   78.6 kg (173 lb 4.5 oz)   SpO2: 97% 98%  100%    Wt Readings from Last 3 Encounters:  10/01/13 78.6 kg (173 lb 4.5 oz)  10/01/13 78.6 kg (173 lb 4.5 oz)  10/01/13 78.6 kg (173 lb 4.5 oz)     Intake/Output Summary (Last 24 hours) at 10/01/13 1337 Last data filed at 10/01/13 1330  Gross per 24 hour  Intake    290 ml  Output   1550 ml  Net  -1260 ml    Exam  General: Well developed, well nourished, NAD, appears stated age  HEENT: NCAT, PERRLA, EOMI, Anicteic Sclera, mucous membranes moist.   Neck: Supple, no JVD, no masses  Cardiovascular: S1 S2 auscultated,Regular rate and rhythm.  Respiratory: Clear to auscultation bilaterally with equal chest rise  Abdomen: Soft, nontender, nondistended, + bowel sounds  Extremities: warm dry without cyanosis clubbing or edema  Neuro: AAOx3,  cranial nerves grossly intact.   Skin: Without rashes exudates or nodules  Psych: Normal affect and demeanor with intact judgement and insight  Data Review   Micro Results Recent Results (from the past 240 hour(s))  MRSA PCR SCREENING     Status: None   Collection Time    09/23/13 11:47 PM  Result Value Range Status   MRSA by PCR NEGATIVE  NEGATIVE Final   Comment:            The GeneXpert MRSA Assay (FDA     approved for NASAL specimens     only), is one component of a     comprehensive MRSA colonization     surveillance program. It is not     intended to diagnose MRSA     infection nor to guide or     monitor treatment for     MRSA infections.    Radiology Reports Dg Chest 1 View  09/23/2013   CLINICAL DATA:  Chest pain  EXAM: CHEST - 1 VIEW  COMPARISON:  04/22/2013  FINDINGS: The heart size and mediastinal contours are within normal limits. Both lungs are clear. The visualized skeletal structures are unremarkable.  IMPRESSION: No active disease.   Electronically Signed   By: Inez Catalina M.D.   On: 09/23/2013 12:22   Dg Chest 2 View  09/29/2013   CLINICAL DATA:  Cough  EXAM: CHEST  2 VIEW  COMPARISON:  09/25/2013  FINDINGS: The cardiac shadow is stable. A right-sided central venous line is again seen and stable. The lungs are well aerated. Mild patchy changes are noted bilaterally in the bases although stable from the prior study. The nasogastric catheter is been removed in the interval. No new focal abnormality is seen.  IMPRESSION: Stable bibasilar changes.  No new focal abnormality is noted.   Electronically Signed   By: Inez Catalina M.D.   On: 09/29/2013 11:19   Ct Abdomen Pelvis W Contrast  09/23/2013   CLINICAL DATA:  Vomiting, epigastric pain  EXAM: CT ABDOMEN AND PELVIS WITH CONTRAST  TECHNIQUE: Multidetector CT imaging of the abdomen and pelvis was performed using the standard protocol following bolus administration of intravenous contrast.  CONTRAST:  157mL  OMNIPAQUE IOHEXOL 300 MG/ML  SOLN  COMPARISON:  09/23/2013 abdominal ultrasound  FINDINGS: Minor dependent basilar atelectasis bilaterally. Normal heart size. No pericardial effusion. Trace left pleural effusion. Distal esophagus is patulous and fluid-filled, suspect esophageal dysmotility. Coronary calcifications noted. Degenerative changes of the lower thoracic spine with large osteophytes.  Abdomen: Mild nodularity to the liver surface compatible with cirrhosis. No biliary dilatation. Gallbladder, biliary system, pancreas, spleen, and adrenal glands are within normal limits for age and demonstrate no acute finding. Incidental splenic calcification compatible granulomatous disease, image 29.  The stomach and proximal duodenum are distended with hyperdense heterogeneous fluid. Third portion of the duodenum demonstrates intraluminal dense contrast. This is compatible with contrast extravasation/active duodenal bleeding. This portion of the duodenum demonstrates circumferential wall thickening. Appearance is compatible with bleeding from a duodenal ulcer with duodenitis versus a duodenum mass.  Kidneys demonstrate hypodense renal cysts bilaterally. Largest cyst in the left kidney midpole measures 13 mm, image 38. No renal obstruction or hydronephrosis. No obstructing urinary tract calculi.  Atherosclerotic changes of the aorta diffusely. Negative for aneurysm or dissection.  No free air, fluid collection, or abscess. Diffuse colonic diverticulosis present.  Pelvis: Extensive sigmoid region diverticulosis. No pelvic free fluid, fluid collection, hemorrhage, abscess, or adenopathy. No inguinal abnormality or hernia. Urinary bladder unremarkable. Previous right hip arthroplasty noted. Diffuse degenerative changes of the spine and pelvis.  IMPRESSION: Active contrast extravasation/ bleeding within the distal duodenum in an area of duodenal wall thickening compatible with duodenal ulcer disease with duodenitis versus a  duodenal mass. Associated gastric outlet obstruction with marked distention of the stomach and proximal duodenum.  Bibasilar atelectasis and trace left pleural fluid  Probable mild hepatic cirrhosis  Aortic atherosclerosis without occlusion  Incidental renal cysts  Colonic diverticulosis  These results were called by telephone at the time of interpretation on 09/23/2013 at 6:35 PM to Dr. Varney Biles , who verbally acknowledged these results.   Electronically Signed   By: Daryll Brod M.D.   On: 09/23/2013 18:49   Ir Angiogram Visceral Selective  09/27/2013   CLINICAL DATA:  Gastrointestinal bleeding. Duodenal ulcer was clipped.  EXAM: SELECTIVE VISCERAL ARTERIOGRAPHY; ADDITIONAL ARTERIOGRAPHY; IR ULTRASOUND GUIDANCE VASC ACCESS RIGHT  MEDICATIONS AND MEDICAL HISTORY: Versed 5 mg, Fentanyl 25 mcg.  ANESTHESIA/SEDATION: Moderate sedation time: 25 minutes  CONTRAST:  80 cc Omnipaque 300  FLUOROSCOPY TIME:  4 min and 48 seconds.  PROCEDURE: The procedure, risks, benefits, and alternatives were explained to the patient. Questions regarding the procedure were encouraged and answered. The patient understands and consents to the procedure.  The right groin was prepped with Betadine in a sterile fashion, and a sterile drape was applied covering the operative field. A sterile gown and sterile gloves were used for the procedure.  Under sonographic guidance, a in 19 gauge needle was inserted into the common femoral artery and removed over a Bentson. A 5 French sheath was inserted. A cobra 2 catheter was advanced over the Bentson wire and into the celiac axis. Contrast was injected. It was advanced over a glidewire into the common hepatic artery and angiography was performed. The catheter was advanced into the gastroduodenal artery and repeat imaging was performed.  The catheter was retracted and advanced over a Bentson wire into the SMA. Angiography was performed in the AP and bilateral oblique views. The cobra catheter  was removed. Femoral angiography was performed. Exo seal was deployed for hemostasis without complication  COMPLICATIONS: None  FINDINGS: Injection of the celiac axis demonstrates vascular anatomy and no source of gastrointestinal bleeding. Specifically, injection of the gastroduodenal artery demonstrates no active extravasation of contrast or pseudoaneurysm. Of note, the endoscopic clips within the distal duodenum are apparently beyond the vascular distribution of the gastroduodenal artery.  Injection of the SMA demonstrates no source of active gastrointestinal bleeding. Jejunal branches leading towards the duodenal clips or noted. Embolization was not performed.  IMPRESSION: Gastrointestinal angiography demonstrates no source of bleeding. Both the celiac and SMA were injected. The region of the bleeding noted on endoscopy was beyond the distribution of the gastroduodenal artery, therefore empiric embolization was not performed. The source vessel also not be identified from the SMA injection.   Electronically Signed   By: Maryclare Bean M.D.   On: 09/27/2013 13:21   Ir Angiogram Visceral Selective  09/27/2013   CLINICAL DATA:  Gastrointestinal bleeding. Duodenal ulcer was clipped.  EXAM: SELECTIVE VISCERAL ARTERIOGRAPHY; ADDITIONAL ARTERIOGRAPHY; IR ULTRASOUND GUIDANCE VASC ACCESS RIGHT  MEDICATIONS AND MEDICAL HISTORY: Versed 5 mg, Fentanyl 25 mcg.  ANESTHESIA/SEDATION: Moderate sedation time: 25 minutes  CONTRAST:  80 cc Omnipaque 300  FLUOROSCOPY TIME:  4 min and 48 seconds.  PROCEDURE: The procedure, risks, benefits, and alternatives were explained to the patient. Questions regarding the procedure were encouraged and answered. The patient understands and consents to the procedure.  The right groin was prepped with Betadine in a sterile fashion, and a sterile drape was applied covering the operative field. A sterile gown and sterile gloves were used for the procedure.  Under sonographic guidance, a in 19 gauge  needle was inserted into the common femoral artery and removed over  a Bentson. A 5 French sheath was inserted. A cobra 2 catheter was advanced over the Bentson wire and into the celiac axis. Contrast was injected. It was advanced over a glidewire into the common hepatic artery and angiography was performed. The catheter was advanced into the gastroduodenal artery and repeat imaging was performed.  The catheter was retracted and advanced over a Bentson wire into the SMA. Angiography was performed in the AP and bilateral oblique views. The cobra catheter was removed. Femoral angiography was performed. Exo seal was deployed for hemostasis without complication  COMPLICATIONS: None  FINDINGS: Injection of the celiac axis demonstrates vascular anatomy and no source of gastrointestinal bleeding. Specifically, injection of the gastroduodenal artery demonstrates no active extravasation of contrast or pseudoaneurysm. Of note, the endoscopic clips within the distal duodenum are apparently beyond the vascular distribution of the gastroduodenal artery.  Injection of the SMA demonstrates no source of active gastrointestinal bleeding. Jejunal branches leading towards the duodenal clips or noted. Embolization was not performed.  IMPRESSION: Gastrointestinal angiography demonstrates no source of bleeding. Both the celiac and SMA were injected. The region of the bleeding noted on endoscopy was beyond the distribution of the gastroduodenal artery, therefore empiric embolization was not performed. The source vessel also not be identified from the SMA injection.   Electronically Signed   By: Maryclare Bean M.D.   On: 09/27/2013 13:21   Ir Angiogram Selective Each Additional Vessel  09/27/2013   CLINICAL DATA:  Gastrointestinal bleeding. Duodenal ulcer was clipped.  EXAM: SELECTIVE VISCERAL ARTERIOGRAPHY; ADDITIONAL ARTERIOGRAPHY; IR ULTRASOUND GUIDANCE VASC ACCESS RIGHT  MEDICATIONS AND MEDICAL HISTORY: Versed 5 mg, Fentanyl 25 mcg.   ANESTHESIA/SEDATION: Moderate sedation time: 25 minutes  CONTRAST:  80 cc Omnipaque 300  FLUOROSCOPY TIME:  4 min and 48 seconds.  PROCEDURE: The procedure, risks, benefits, and alternatives were explained to the patient. Questions regarding the procedure were encouraged and answered. The patient understands and consents to the procedure.  The right groin was prepped with Betadine in a sterile fashion, and a sterile drape was applied covering the operative field. A sterile gown and sterile gloves were used for the procedure.  Under sonographic guidance, a in 19 gauge needle was inserted into the common femoral artery and removed over a Bentson. A 5 French sheath was inserted. A cobra 2 catheter was advanced over the Bentson wire and into the celiac axis. Contrast was injected. It was advanced over a glidewire into the common hepatic artery and angiography was performed. The catheter was advanced into the gastroduodenal artery and repeat imaging was performed.  The catheter was retracted and advanced over a Bentson wire into the SMA. Angiography was performed in the AP and bilateral oblique views. The cobra catheter was removed. Femoral angiography was performed. Exo seal was deployed for hemostasis without complication  COMPLICATIONS: None  FINDINGS: Injection of the celiac axis demonstrates vascular anatomy and no source of gastrointestinal bleeding. Specifically, injection of the gastroduodenal artery demonstrates no active extravasation of contrast or pseudoaneurysm. Of note, the endoscopic clips within the distal duodenum are apparently beyond the vascular distribution of the gastroduodenal artery.  Injection of the SMA demonstrates no source of active gastrointestinal bleeding. Jejunal branches leading towards the duodenal clips or noted. Embolization was not performed.  IMPRESSION: Gastrointestinal angiography demonstrates no source of bleeding. Both the celiac and SMA were injected. The region of the bleeding  noted on endoscopy was beyond the distribution of the gastroduodenal artery, therefore empiric embolization was not performed. The source  vessel also not be identified from the SMA injection.   Electronically Signed   By: Maryclare Bean M.D.   On: 09/27/2013 13:21   US Abdomen Limited  09/23/2013   CLINICAL DATA:  Vomiting  EXAM: US ABDOMEN LIMITED - RIGHT UPPER QUADRANT  COMPARISON:  None.  FINDINGS: Gallbladder  No gallstones or wall thickening visualized. There is no pericholecystic fluid. No sonographic Murphy sign noted.  Common bile duct  Diameter: 3 mm. There is no intrahepatic or extrahepatic biliary duct dilatation.  Liver:  No focal lesion identified. Liver echogenicity is somewhat increased and inhomogeneous.  IMPRESSION: Liver echogenicity is somewhat increased and inhomogeneous. This appearance is consistent with fatty change and/or underlying parenchymal disease. While no focal liver lesions are identified, it must be cautioned that the sensitivity of ultrasound for focal liver lesions is diminished in this circumstance. Study is otherwise unremarkable.   Electronically Signed   By: Lowella Grip M.D.   On: 09/23/2013 14:48   Ir US Guide Vasc Access Right  09/27/2013   CLINICAL DATA:  Gastrointestinal bleeding. Duodenal ulcer was clipped.  EXAM: SELECTIVE VISCERAL ARTERIOGRAPHY; ADDITIONAL ARTERIOGRAPHY; IR ULTRASOUND GUIDANCE VASC ACCESS RIGHT  MEDICATIONS AND MEDICAL HISTORY: Versed 5 mg, Fentanyl 25 mcg.  ANESTHESIA/SEDATION: Moderate sedation time: 25 minutes  CONTRAST:  80 cc Omnipaque 300  FLUOROSCOPY TIME:  4 min and 48 seconds.  PROCEDURE: The procedure, risks, benefits, and alternatives were explained to the patient. Questions regarding the procedure were encouraged and answered. The patient understands and consents to the procedure.  The right groin was prepped with Betadine in a sterile fashion, and a sterile drape was applied covering the operative field. A sterile gown and sterile  gloves were used for the procedure.  Under sonographic guidance, a in 19 gauge needle was inserted into the common femoral artery and removed over a Bentson. A 5 French sheath was inserted. A cobra 2 catheter was advanced over the Bentson wire and into the celiac axis. Contrast was injected. It was advanced over a glidewire into the common hepatic artery and angiography was performed. The catheter was advanced into the gastroduodenal artery and repeat imaging was performed.  The catheter was retracted and advanced over a Bentson wire into the SMA. Angiography was performed in the AP and bilateral oblique views. The cobra catheter was removed. Femoral angiography was performed. Exo seal was deployed for hemostasis without complication  COMPLICATIONS: None  FINDINGS: Injection of the celiac axis demonstrates vascular anatomy and no source of gastrointestinal bleeding. Specifically, injection of the gastroduodenal artery demonstrates no active extravasation of contrast or pseudoaneurysm. Of note, the endoscopic clips within the distal duodenum are apparently beyond the vascular distribution of the gastroduodenal artery.  Injection of the SMA demonstrates no source of active gastrointestinal bleeding. Jejunal branches leading towards the duodenal clips or noted. Embolization was not performed.  IMPRESSION: Gastrointestinal angiography demonstrates no source of bleeding. Both the celiac and SMA were injected. The region of the bleeding noted on endoscopy was beyond the distribution of the gastroduodenal artery, therefore empiric embolization was not performed. The source vessel also not be identified from the SMA injection.   Electronically Signed   By: Maryclare Bean M.D.   On: 09/27/2013 13:21   Dg Chest Port 1 View  09/25/2013   CLINICAL DATA:  Central line placement.  EXAM: PORTABLE CHEST - 1 VIEW  COMPARISON:  Chest radiograph performed 09/23/2013  FINDINGS: The patient's right IJ line is noted ending about the mid  to  distal SVC. The enteric tube is noted extending below the diaphragm.  The lungs are hypoexpanded. Mild bibasilar atelectasis is noted. No pleural effusion or pneumothorax is seen.  The cardiomediastinal silhouette is normal in size. No acute osseous abnormalities are identified.  IMPRESSION: 1. Right IJ line noted ending about the mid to distal SVC. 2. Lungs hypoexpanded, with mild bibasilar atelectasis.   Electronically Signed   By: Garald Balding M.D.   On: 09/25/2013 00:17   Dg Abd Portable 1v  09/26/2013   CLINICAL DATA:  Abdominal pain  EXAM: PORTABLE ABDOMEN - 1 VIEW  COMPARISON:  Abdominal films of September 25, 2013.  FINDINGS: The bowel gas pattern does not suggest obstruction or ileus. No abnormal extraluminal gas collections are demonstrated. Portions of the left aspect of the abdomen are excluded from the field-of-view. There is radiodense material in the region of the urinary bladder likely from the previous CT scan. There are surgical clips overlying the right and left L2 transverse processes.  IMPRESSION: There is no evidence of bowel obstruction or ileus. A three-way abdominal series may be useful if further plain film imaging is desired.   Electronically Signed   By: David  Martinique   On: 09/26/2013 11:05   Dg Abd Portable 1v  09/25/2013   CLINICAL DATA:  Gastric bleeding. Concern for small bowel obstruction.  EXAM: PORTABLE ABDOMEN - 1 VIEW  COMPARISON:  CT of the abdomen and pelvis performed 09/23/2013  FINDINGS: The visualized bowel gas pattern is grossly unremarkable. There is no evidence of bowel obstruction. Known active bleeding at the duodenum is not characterized on radiograph. The patient's enteric tube is noted overlying the body of the stomach. No free intra-abdominal air is identified, though evaluation for free air is suboptimal on supine views.  No acute osseous abnormalities are seen. Minimal degenerative change is noted along the lumbar spine. The patient's right hip  prosthesis is grossly unremarkable in appearance, though incompletely imaged. Contrast is seen filling the bladder.  IMPRESSION: Unremarkable bowel gas pattern; no free intra-abdominal air seen.   Electronically Signed   By: Garald Balding M.D.   On: 09/25/2013 02:33   Ct Angio Abd/pel W/ And/or W/o  09/25/2013   CLINICAL DATA:  Upper GI bleed; bright red blood from nasogastric tube.  EXAM: CT ANGIOGRAPHY ABDOMEN AND PELVIS WITH CONTRAST AND WITHOUT CONTRAST  TECHNIQUE: Multidetector CT imaging of the abdomen and pelvis was performed using the standard protocol during bolus administration of intravenous contrast. Multiplanar reconstructed images including MIPs were obtained and reviewed to evaluate the vascular anatomy.  CONTRAST:  170mL OMNIPAQUE IOHEXOL 350 MG/ML SOLN  COMPARISON:  CT of the abdomen and pelvis performed 09/23/2013  FINDINGS: Trace bilateral pleural effusions are seen, left greater than right, with bibasilar airspace opacification likely reflecting atelectasis.  The known focus of acute duodenal hemorrhage is better characterized on the prior study, as arterial phase images remain too early to visualize active contrast extravasation at the duodenum. As before, there is apparent wall thickening at the second segment of the duodenum; this may reflect an underlying mass or a bleeding duodenal ulcer and associated duodenitis. Mildly heterogeneous blood is again seen distending the duodenum, with the duodenum measuring up to 5.0 cm in diameter. This appearance is grossly unchanged from the prior study.  The patient's enteric tube is seen ending at the body of the stomach. The stomach is largely decompressed, aside from a small amount of high attenuation fluid in the antrum, likely reflecting blood tracking  from the duodenum.  The nodular contour of the liver is compatible with cirrhotic change. Minimal residual contrast is noted at the Phrygian cap of the gallbladder. The spleen is diminutive, with  a few calcified granulomata. There is mild diffuse soft tissue stranding about the pancreas, more prominent than on the prior study. This may reflect the duodenal process, though would correlate with lipase levels. Peripancreatic nodes remain stable in size. The adrenal glands are unremarkable in appearance.  Mild calcification is noted along the abdominal aorta and its branches. There is slight ectasia of the distal abdominal aorta, without evidence of aneurysmal dilatation. The celiac trunk, superior mesenteric artery, bilateral renal arteries and inferior mesenteric artery remain patent. The inferior vena cava is grossly unremarkable in appearance.  Nonspecific perinephric stranding is noted bilaterally. Scarring is noted near the upper pole of the left kidney. Small bilateral renal cysts are seen. The kidneys are otherwise unremarkable. There is no evidence of hydronephrosis. No renal or ureteral stones are seen.  No free fluid is identified. The small bowel is unremarkable in appearance. The stomach is within normal limits. No acute vascular abnormalities are seen.  The appendix is grossly unremarkable in appearance, without evidence for appendicitis. Scattered diverticulosis is noted along the entirety of the colon, without evidence of diverticulitis.  The bladder is mildly distended and grossly unremarkable in appearance. Mildly increased attenuation within the bladder may reflect residual contrast from the prior study. The prostate remains normal in size, with scattered calcification. No inguinal lymphadenopathy is seen.  No acute osseous abnormalities are identified. The patient's right hip prosthesis is incompletely imaged but appears grossly unremarkable.  Review of the MIP images confirms the above findings.  IMPRESSION: 1. Known focus of acute duodenal hemorrhage is better characterized on the prior study, as arterial phase images remain too early to visualize active contrast extravasation at the  duodenum. As before, there is apparent wall thickening of the second segment of the duodenum. This may reflect an underlying mass or bleeding duodenal ulcer, and associated duodenitis. Mildly heterogeneous blood again seen distending the duodenum and tracking back into the gastric antrum. The duodenum measures up to 5.0 cm in diameter, relatively stable from the prior study. 2. Mildly increased diffuse soft tissue stranding about the pancreas. This may reflect the duodenal process, though would correlate with lipase levels to exclude mild pancreatitis. 3. Trace bilateral pleural effusions, left greater than right, with bibasilar airspace opacification likely reflecting atelectasis. 4. Changes of hepatic cirrhosis noted. 5. Mild calcification along the abdominal aorta and its branches. 6. Small bilateral renal cysts; scarring near the upper pole of the left kidney. 7. Scattered diverticulosis along the entirety of the colon, without evidence of diverticulitis.   Electronically Signed   By: Garald Balding M.D.   On: 09/25/2013 03:59    CBC  Recent Labs Lab 09/27/13 0215 09/28/13 0500 09/29/13 0500 09/30/13 0735 09/30/13 1905 10/01/13 1000  WBC 10.1 10.3 16.5* 11.0*  --  8.2  HGB 9.2* 9.0* 8.5* 8.0* 7.4* 7.6*  HCT 27.1* 26.5* 25.1* 24.0* 22.2* 22.1*  PLT 213 253 282 328  --  406*  MCV 90.6 91.7 91.9 92.7  --  91.3  MCH 30.8 31.1 31.1 30.9  --  31.4  MCHC 33.9 34.0 33.9 33.3  --  34.4  RDW 17.0* 16.9* 16.4* 16.3*  --  16.3*    Chemistries   Recent Labs Lab 09/25/13 0155 09/25/13 0226 09/26/13 0500 09/27/13 0215 09/29/13 0500 10/01/13 1000  NA  --  142 145 140 139 140  K  --  3.3* 3.6 3.5 3.9 4.0  CL  --  110 115* 110 106 108  CO2  --  23 24 21 21 21   GLUCOSE  --  141* 104* 99 111* 179*  BUN  --  49* 48* 32* 22 21  CREATININE  --  1.33 1.42* 1.15 1.05 1.02  CALCIUM  --  7.2* 7.2* 7.6* 8.0* 8.5  MG 1.9  --   --  2.3  --   --   AST  --   --   --   --  28  --   ALT  --   --   --    --  12  --   ALKPHOS  --   --   --   --  40  --   BILITOT  --   --   --   --  0.6  --    ------------------------------------------------------------------------------------------------------------------ estimated creatinine clearance is 51 ml/min (by C-G formula based on Cr of 1.02). ------------------------------------------------------------------------------------------------------------------ No results found for this basename: HGBA1C,  in the last 72 hours ------------------------------------------------------------------------------------------------------------------  Recent Labs  09/29/13 0500  CHOL 100  HDL 24*  LDLCALC 53  TRIG 116  CHOLHDL 4.2   ------------------------------------------------------------------------------------------------------------------ No results found for this basename: TSH, T4TOTAL, FREET3, T3FREE, THYROIDAB,  in the last 72 hours ------------------------------------------------------------------------------------------------------------------ No results found for this basename: VITAMINB12, FOLATE, FERRITIN, TIBC, IRON, RETICCTPCT,  in the last 72 hours  Coagulation profile  Recent Labs Lab 09/25/13 0030 09/26/13 1345  INR 1.31 1.37    No results found for this basename: DDIMER,  in the last 72 hours  Cardiac Enzymes No results found for this basename: CK, CKMB, TROPONINI, MYOGLOBIN,  in the last 168 hours ------------------------------------------------------------------------------------------------------------------ No components found with this basename: POCBNP,

## 2013-10-01 NOTE — Progress Notes (Signed)
Progress Note   Subjective  feels okay   Objective   Vital signs in last 24 hours: Temp:  [98.1 F (36.7 C)-98.6 F (37 C)] 98.6 F (37 C) (01/01 1350) Pulse Rate:  [73-103] 73 (01/01 1350) Resp:  [18] 18 (01/01 1350) BP: (120-140)/(48-97) 124/60 mmHg (01/01 1350) SpO2:  [97 %-100 %] 97 % (01/01 1350) Weight:  [173 lb 4.5 oz (78.6 kg)] 173 lb 4.5 oz (78.6 kg) (01/01 0700) Last BM Date: 09/29/13 General:    white male in NAD. Daughter in room Abdomen:  Soft, nontender and nondistended. Normal bowel sounds. Neurologic:  Alert and oriented,  grossly normal neurologically. Psych:  Cooperative. Normal mood and affect.  Lab Results:  Recent Labs  09/29/13 0500 09/30/13 0735 09/30/13 1905 10/01/13 1000  WBC 16.5* 11.0*  --  8.2  HGB 8.5* 8.0* 7.4* 7.6*  HCT 25.1* 24.0* 22.2* 22.1*  PLT 282 328  --  406*   BMET  Recent Labs  09/29/13 0500 10/01/13 1000  NA 139 140  K 3.9 4.0  CL 106 108  CO2 21 21  GLUCOSE 111* 179*  BUN 22 21  CREATININE 1.05 1.02  CALCIUM 8.0* 8.5   LFT  Recent Labs  09/29/13 0500  PROT 5.0*  ALBUMIN 2.3*  AST 28  ALT 12  ALKPHOS 40  BILITOT 0.6    Studies/Results:   EGD 09/25/13 FINDINGS: The procedure was extremely difficult to perform. Upon  intial entry into the esophagus there was evidence of a severe  esophagitis - LA Grade D. In the gastric lumen there was fresh  blood noted in the antral region. No evidence of any ulcers or  erosions in the gastric lumen. In the duodenal bulb there was  evidence of a very large clot. The clot was occluding the lumen.  There was also evidence of fresh blood around the clot. With slow  persistence, a combination of suctioning and snaring allowed the  clot to be reduced in size. At one point the obstructive clot was  able to be pushed distally. In the 3rd portion of the duodenum, at  least this is where I thought the lesion was located, a large  visible vessel was noted in the setting  of a shallow ulcer. In  fact, shallow ulcerations and erosions were identified throughout  the examined duodenum. It was not as apparent after this area.  Two one milliliter injections of 1:10,000 Epi was injected around  the site. The initial hemoclip was able to be placed across the  vessel. Two more hemoclips were placed adjacent to the vessel. It  was difficult to grasp the mucosa as the area exhibited a shallow  ulceration. I then pushed the pediatric scope distally to push the  clot further down and eliminate any other sites of bleeding. I was  not able to see any other lesions. Reinspection of the vessel  revealed that the initial hemoclip was not across the vessel.  Another hemoclip was able to be successfully placed. No bleeding  was precipitated and there was no further evidence of bleeding.  Retroflexed views revealed no abnormalities. The scope was then  withdrawn from the patient and the procedure terminated.  COMPLICATIONS: There were no complications.  IMPRESSION:  1) Massive duodenal visible vessel in the setting of an ulcer s/p  hemoclipping.  2) Shallow duodenal ulcers.  3) LA Grade D esophagitis.     Assessment / Plan:   1. Recent upper GI bleed secondary to  duodenal ulcer with visible vessel 12/26. Multiple other duodenal ulcers found ( biopsies raise suspicion for ischemia). Endoclip was placed on visible vessel. Following that IR did mesenteric angio but no active bleeding found. Patient didn't have any further bleeding for a few days. Now with declining hgb. Patient did have a loose black stool this am. On exam there is loose melenic stool seeping from anus and same in vault. He is hemodynamically stable. Needs very close monitoring. Not sure that this is just old blood coming out. BUN normal today so that is reassuring.  On BID IV PPI. Getting unit of blood now, will check CBC Q 6 hours for now. Keep on clears.  2. Anemia of acute blood loss, s/p 2 units of blood.  Hgb has declined again (9.2>>8.5>>7.4 yesterday). Hgb stable overnight but still getting a unit of blood right now.   3. Cirrhosis by CTscan    LOS: 8 days   Tye Savoy  10/01/2013, 2:23 PM    GI Attending  I have also seen and assessed the patient and agree with the above note. Will see how it goes - no need to do  EGD now but if persistent signs suggesting bleeding then repeat EGD possible. Dr. Benson Norway to check again tomorrow.  Gatha Mayer, MD, [facility] [phone] (pager) 10/01/2013 4:49 PM

## 2013-10-02 LAB — TYPE AND SCREEN
ABO/RH(D): A POS
Antibody Screen: NEGATIVE
DAT, IgG: NEGATIVE
Unit division: 0
Unit division: 0

## 2013-10-02 LAB — BASIC METABOLIC PANEL
BUN: 17 mg/dL (ref 6–23)
CO2: 22 mEq/L (ref 19–32)
Calcium: 8.5 mg/dL (ref 8.4–10.5)
Chloride: 104 mEq/L (ref 96–112)
Creatinine, Ser: 0.97 mg/dL (ref 0.50–1.35)
GFR calc Af Amer: 84 mL/min — ABNORMAL LOW (ref >90)
GFR calc non Af Amer: 72 mL/min — ABNORMAL LOW (ref >90)
Glucose, Bld: 115 mg/dL — ABNORMAL HIGH (ref 70–99)
Potassium: 3.7 mEq/L (ref 3.7–5.3)
Sodium: 140 mEq/L (ref 137–147)

## 2013-10-02 LAB — CBC
HCT: 25.4 % — ABNORMAL LOW (ref 39.0–52.0)
HCT: 26.8 % — ABNORMAL LOW (ref 39.0–52.0)
HCT: 25.9 % — ABNORMAL LOW (ref 39.0–52.0)
HCT: 27 % — ABNORMAL LOW (ref 39.0–52.0)
Hemoglobin: 8.6 g/dL — ABNORMAL LOW (ref 13.0–17.0)
Hemoglobin: 9 g/dL — ABNORMAL LOW (ref 13.0–17.0)
Hemoglobin: 9 g/dL — ABNORMAL LOW (ref 13.0–17.0)
Hemoglobin: 9 g/dL — ABNORMAL LOW (ref 13.0–17.0)
MCH: 30.7 pg (ref 26.0–34.0)
MCH: 30.6 pg (ref 26.0–34.0)
MCH: 31.6 pg (ref 26.0–34.0)
MCH: 30.7 pg (ref 26.0–34.0)
MCHC: 33.9 g/dL (ref 30.0–36.0)
MCHC: 33.6 g/dL (ref 30.0–36.0)
MCHC: 34.7 g/dL (ref 30.0–36.0)
MCHC: 33.3 g/dL (ref 30.0–36.0)
MCV: 90.7 fL (ref 78.0–100.0)
MCV: 91.2 fL (ref 78.0–100.0)
MCV: 90.9 fL (ref 78.0–100.0)
MCV: 92.2 fL (ref 78.0–100.0)
Platelets: 440 10*3/uL — ABNORMAL HIGH (ref 150–400)
Platelets: 484 10*3/uL — ABNORMAL HIGH (ref 150–400)
Platelets: 556 10*3/uL — ABNORMAL HIGH (ref 150–400)
Platelets: 538 10*3/uL — ABNORMAL HIGH (ref 150–400)
RBC: 2.8 MIL/uL — ABNORMAL LOW (ref 4.22–5.81)
RBC: 2.94 MIL/uL — ABNORMAL LOW (ref 4.22–5.81)
RBC: 2.85 MIL/uL — ABNORMAL LOW (ref 4.22–5.81)
RBC: 2.93 MIL/uL — ABNORMAL LOW (ref 4.22–5.81)
RDW: 16.7 % — ABNORMAL HIGH (ref 11.5–15.5)
RDW: 16.8 % — ABNORMAL HIGH (ref 11.5–15.5)
RDW: 16.8 % — ABNORMAL HIGH (ref 11.5–15.5)
RDW: 16.5 % — ABNORMAL HIGH (ref 11.5–15.5)
WBC: 10.3 10*3/uL (ref 4.0–10.5)
WBC: 8.6 10*3/uL (ref 4.0–10.5)
WBC: 9.2 10*3/uL (ref 4.0–10.5)
WBC: 8.6 10*3/uL (ref 4.0–10.5)

## 2013-10-02 LAB — GLUCOSE, CAPILLARY
Glucose-Capillary: 103 mg/dL — ABNORMAL HIGH (ref 70–99)
Glucose-Capillary: 105 mg/dL — ABNORMAL HIGH (ref 70–99)
Glucose-Capillary: 107 mg/dL — ABNORMAL HIGH (ref 70–99)
Glucose-Capillary: 111 mg/dL — ABNORMAL HIGH (ref 70–99)

## 2013-10-02 MED ORDER — BENZONATATE 100 MG PO CAPS
100.0000 mg | ORAL_CAPSULE | Freq: Three times a day (TID) | ORAL | Status: DC | PRN
  Administered 2013-10-03: 100 mg via ORAL
  Filled 2013-10-02 (×3): qty 1

## 2013-10-02 NOTE — Progress Notes (Signed)
Subjective: No complaints.  No reports of melena or hematochezia.  Objective: Vital signs in last 24 hours: Temp:  [97.6 F (36.4 C)-98.6 F (37 C)] 97.7 F (36.5 C) (01/02 0631) Pulse Rate:  [73-103] 94 (01/02 0631) Resp:  [18-20] 20 (01/02 0631) BP: (124-166)/(60-97) 162/86 mmHg (01/02 0631) SpO2:  [97 %-100 %] 98 % (01/02 0631) Weight:  [170 lb 13.7 oz (77.5 kg)] 170 lb 13.7 oz (77.5 kg) (01/02 0631) Last BM Date: 10/01/13  Intake/Output from previous day: 01/01 0701 - 01/02 0700 In: 480 [P.O.:480] Out: 2425 [Urine:2425] Intake/Output this shift:    General appearance: alert and no distress GI: soft, non-tender; bowel sounds normal; no masses,  no organomegaly  Lab Results:  Recent Labs  10/01/13 1000 10/01/13 1900 10/02/13 0045  WBC 8.2 8.2 10.3  HGB 7.6* 9.1* 8.6*  HCT 22.1* 25.5* 25.4*  PLT 406* 423* 440*   BMET  Recent Labs  10/01/13 1000  NA 140  K 4.0  CL 108  CO2 21  GLUCOSE 179*  BUN 21  CREATININE 1.02  CALCIUM 8.5   LFT No results found for this basename: PROT, ALBUMIN, AST, ALT, ALKPHOS, BILITOT, BILIDIR, IBILI,  in the last 72 hours PT/INR No results found for this basename: LABPROT, INR,  in the last 72 hours Hepatitis Panel No results found for this basename: HEPBSAG, HCVAB, HEPAIGM, HEPBIGM,  in the last 72 hours C-Diff No results found for this basename: CDIFFTOX,  in the last 72 hours Fecal Lactopherrin No results found for this basename: FECLLACTOFRN,  in the last 72 hours  Studies/Results: Dg Swallowing Func-speech Pathology  10/01/2013   Leah Meryl McCoy, CCC-SLP     10/01/2013 10:00 AM Objective Swallowing Evaluation: Modified Barium Swallowing Study   Patient Details  Name: [name] MRN: [id number] Date of Birth: 1924/04/02  Today's Date: 10/01/2013 Time: 0930-0950 SLP Time Calculation (min): 20 min  Past Medical History:  Past Medical History  Diagnosis Date  . Hyperlipemia   . CVA (cerebral infarction)   . Hypertension   .  Stroke     2011 no deficits   . GERD (gastroesophageal reflux disease)     hx of   . Arthritis   . Ulcer     stomach 2011 related to plavix   . Hx of transfusion of packed red blood cells   . Diabetes mellitus without complication     Diet controlled   Past Surgical History:  Past Surgical History  Procedure Laterality Date  . Total hip arthroplasty Right 04/28/2013    Procedure: RIGHT TOTAL HIP ARTHROPLASTY ANTERIOR APPROACH;   Surgeon: Mauri Pole, MD;  Location: [facility] ORS;  Service:  Orthopedics;  Laterality: Right;  . Esophagogastroduodenoscopy N/A 09/23/2013    Procedure: ESOPHAGOGASTRODUODENOSCOPY (EGD);  Surgeon: Lafayette Dragon, MD;  Location: [facility] ENDOSCOPY;  Service: Endoscopy;   Laterality: N/A;  . Esophagogastroduodenoscopy N/A 09/25/2013    Procedure: ESOPHAGOGASTRODUODENOSCOPY (EGD);  Surgeon: Beryle Beams, MD;  Location: [facility] ENDOSCOPY;  Service: Endoscopy;   Laterality: N/A;   HPI:  80 year old male with PMH of CVA and GERD, initially admitted by  the critical care service after presenting with signs consistent  with acute GI bleeding and was also hypotensive and required  pressors. CT of the abdomen done at time of admission revealed a  gastric outlet obstruction with marked distention of the stomach  and proximal duodenum with contrast extravasation into the distal  duodenum. There was also associated peripancreatic stranding in  the area near the duodenum. Patient underwent an initial EGD on  09/23/2013 that revealed esophagitis and multiple shallow  duodenal ulcerations. A second EGD was completed on 09/25/2013  which revealed a massive duodenal ulcer with a visible vessel  which was clipped. The patient subsequently was evaluated by IR  for angiography and possible embolization in setting of ongoing  active bleeding. There was also concern that the patient may have  been experiencing mesenteric ischemia but an angiogram revealed  patent SMA and celiac branches with no source of GI bleeding   defined. Patient eventually stabilized from a hemodynamic  standpoint. Hemoglobin has remained stable.      Assessment / Plan / Recommendation Clinical Impression  Dysphagia Diagnosis: Mild pharyngeal phase dysphagia;Moderate  cervical esophageal phase dysphagia Clinical impression: Patient presents with improving overall  function compared to FEES complete 12/31. Patient presents with a  mild pharyngeal  and a moderate cervical esophageal dysphagia. A  delayed swallow initiation results in flash penetration of thin  liquids only. No additional episodes of penetration or aspiration  noted. A tight appearing UES (? due to combination of possible  osteophytes (MD not present) and lower esophageal deficits given  acute diagnosis) result in mild pharyngeal residuals post swallow  (vallecular > pyriform sinus) which are cleared with moderate SLP  cueing for second swallow per bite/sip. Occassional strong cough  response noted despite protection of airway. Prognosis for  continued improvement good with continued improvement in  esophageal/GI function. Aspiration continues to be mild-moderate.  Education complete with patient regarding results and  recommendations. SLP will continue to f/u.     Treatment Recommendation  Therapy as outlined in treatment plan below    Diet Recommendation Thin liquid;Dysphagia 3 (Mechanical Soft)  (full liquids)   Liquid Administration via: Cup;No straw Medication Administration: Whole meds with puree Supervision: Patient able to self feed;Full supervision/cueing  for compensatory strategies Compensations: Slow rate;Small sips/bites;Multiple dry swallows  after each bite/sip Postural Changes and/or Swallow Maneuvers: Seated upright 90  degrees;Upright 30-60 min after meal    Other  Recommendations Oral Care Recommendations: Oral care BID   Follow Up Recommendations  None    Frequency and Duration min 2x/week  2 weeks           General HPI: 80 year old male with PMH of CVA and GERD, initially   admitted by the critical care service after presenting with signs  consistent with acute GI bleeding and was also hypotensive and  required pressors. CT of the abdomen done at time of admission  revealed a gastric outlet obstruction with marked distention of  the stomach and proximal duodenum with contrast extravasation  into the distal duodenum. There was also associated  peripancreatic stranding in the area near the duodenum. Patient  underwent an initial EGD on 09/23/2013 that revealed esophagitis  and multiple shallow duodenal ulcerations. A second EGD was  completed on 09/25/2013 which revealed a massive duodenal ulcer  with a visible vessel which was clipped. The patient subsequently  was evaluated by IR for angiography and possible embolization in  setting of ongoing active bleeding. There was also concern that  the patient may have been experiencing mesenteric ischemia but an  angiogram revealed patent SMA and celiac branches with no source  of GI bleeding defined. Patient eventually stabilized from a  hemodynamic standpoint. Hemoglobin has remained stable.  Type of Study: Modified Barium Swallowing Study Reason for Referral: Objectively evaluate swallowing function Previous Swallow Assessment: none Diet Prior to this Study:  Thin liquids (full liquids) Temperature Spikes Noted: No Respiratory Status: Room air History of Recent Intubation: No Behavior/Cognition: Alert;Cooperative;Pleasant mood Oral Cavity - Dentition: Adequate natural dentition Oral Motor / Sensory Function: Within functional limits Self-Feeding Abilities: Able to feed self Patient Positioning: Upright in chair Baseline Vocal Quality: Clear Volitional Cough: Strong Volitional Swallow: Able to elicit Anatomy: Within functional limits Pharyngeal Secretions: Standing secretions in (comment) (pyriform  sinuses, eliciting cough response)    Reason for Referral Objectively evaluate swallowing function   Oral Phase Oral Preparation/Oral Phase Oral  Phase: WFL   Pharyngeal Phase Pharyngeal Phase Pharyngeal Phase: Impaired Pharyngeal - Nectar Pharyngeal - Nectar Teaspoon: Not tested Pharyngeal - Nectar Cup: Not tested Pharyngeal - Thin Pharyngeal - Thin Cup: Pharyngeal residue - valleculae;Pharyngeal  residue - pyriform sinuses;Pharyngeal residue - cp  segment;Delayed swallow initiation;Premature spillage to pyriform  sinuses;Penetration/Aspiration before swallow Penetration/Aspiration details (thin cup): Material enters  airway, remains ABOVE vocal cords then ejected out Pharyngeal - Thin Straw: Delayed swallow initiation;Pharyngeal  residue - valleculae;Pharyngeal residue - pyriform  sinuses;Pharyngeal residue - cp segment;Premature spillage to  pyriform sinuses;Penetration/Aspiration before swallow Penetration/Aspiration details (thin straw): Material enters  airway, remains ABOVE vocal cords then ejected out Pharyngeal - Solids Pharyngeal - Puree: Delayed swallow initiation;Premature spillage  to valleculae;Pharyngeal residue - valleculae Pharyngeal - Mechanical Soft: Delayed swallow  initiation;Premature spillage to valleculae;Pharyngeal residue -  valleculae Pharyngeal Phase - Comment Pharyngeal Comment: improving function from FEES 12/31  Cervical Esophageal Phase    GO    Cervical Esophageal Phase Cervical Esophageal Phase: Impaired Cervical Esophageal Phase - Thin Thin Cup: Reduced cricopharyngeal relaxation Thin Straw: Reduced cricopharyngeal relaxation Cervical Esophageal Phase - Solids Puree: Reduced cricopharyngeal relaxation Mechanical Soft: Reduced cricopharyngeal relaxation Pill: Reduced cricopharyngeal relaxation Cervical Esophageal Phase - Comment Cervical Esophageal Comment: ? due to combination of possible  osteophytes (MD not present) and lower esophageal deficits given  acute diagnosis.         Gabriel Rainwater MA, CCC-SLP [phone]   McCoy Leah Meryl 10/01/2013, 9:59 AM     Medications:  Scheduled: . insulin aspart  0-15 Units  Subcutaneous TID WC  . metoCLOPramide (REGLAN) injection  10 mg Intravenous Q6H  . pantoprazole (PROTONIX) IV  40 mg Intravenous BID   Continuous: . sodium chloride 20 mL/hr at 10/02/13 0535    Assessment/Plan: 1) Fourth portion of the duodenal ulcer with massively large visible vessel. 2) Anemia.   His HGB has dropped down after his blood transfusion, but no overt evidence of bleeding.   He is on PPI treatment.  If he has a significant rebleed, a repeat EGD can be performed to stabilize the situation.  However, definitive treatment will be surgically.  This is the second time for this type of bleeding and I believe it to be the same lesion.  This is not the typical visible vessel.  Again, IR attempted coiling, but this was abandoned as it was in the territory of the SMA.  Plan: 1) Follow HGB. 2) Transfuse if necessary. 3) If severe bleeding recurs, on par with his admission presentation, surgery is the only option.  LOS: 9 days   Kody Vigil D 10/02/2013, 7:52 AM

## 2013-10-02 NOTE — Progress Notes (Signed)
Occupational Therapy Treatment Patient Details Name: [name] MRN: [id number] DOB: 15-Jan-1924 Today's Date: 10/02/2013 Time: 2836-6294 OT Time Calculation (min): 14 min  OT Assessment / Plan / Recommendation  History of present illness 80 y.o. admitted with GI bleed.   OT comments  Pt progressing toward goals.   Follow Up Recommendations  Home health OT;Supervision/Assistance - 24 hour    Barriers to Discharge       Equipment Recommendations  None recommended by OT    Recommendations for Other Services    Frequency Min 2X/week   Progress towards OT Goals Progress towards OT goals: Progressing toward goals  Plan Discharge plan remains appropriate    Precautions / Restrictions Precautions Precautions: Fall   Pertinent Vitals/Pain See vitals    ADL  Grooming: Performed;Wash/dry hands;Wash/dry face;Teeth care Where Assessed - Grooming: Unsupported standing Upper Body Dressing: Performed;Set up Where Assessed - Upper Body Dressing: Unsupported sitting Toilet Transfer: Simulated;Supervision/safety Toilet Transfer Method: Sit to Loss adjuster, chartered:  (chair) Equipment Used: Gait belt;Rolling walker Transfers/Ambulation Related to ADLs: Close supervision (for safety) with RW ADL Comments: Pt bumped into furniture x2 with RW when ambulating in room and hall. VCs for safety with DME use.    OT Diagnosis:    OT Problem List:   OT Treatment Interventions:     OT Goals(current goals can now be found in the care plan section) Acute Rehab OT Goals Patient Stated Goal: get back to what he was doing before OT Goal Formulation: With patient Time For Goal Achievement: 10/07/13 Potential to Achieve Goals: Good ADL Goals Pt Will Perform Lower Body Bathing: with modified independence;sit to/from stand Pt Will Perform Lower Body Dressing: with modified independence;sit to/from stand Pt Will Transfer to Toilet: with modified independence;ambulating;grab bars Pt Will  Perform Toileting - Clothing Manipulation and hygiene: with modified independence;sit to/from stand Pt Will Perform Tub/Shower Transfer: Shower transfer;with supervision;ambulating;shower seat;rolling walker  Visit Information  Last OT Received On: 10/02/13 Assistance Needed: +1 History of Present Illness: 80 y.o. admitted with GI bleed.    Subjective Data      Prior Functioning       Cognition  Cognition Arousal/Alertness: Awake/alert Behavior During Therapy: WFL for tasks assessed/performed Overall Cognitive Status: Within Functional Limits for tasks assessed    Mobility  Bed Mobility Bed Mobility: Not assessed Transfers Transfers: Sit to Stand;Stand to Sit Sit to Stand: 5: Supervision;From chair/3-in-1 Stand to Sit: To chair/3-in-1;4: Min assist Details for Transfer Assistance: Assist to control descent to chair. Pt tends to "plop" back into chair.    Exercises      Balance     End of Session OT - End of Session Equipment Utilized During Treatment: Gait belt;Rolling walker Activity Tolerance: Patient tolerated treatment well Patient left: in chair;with call bell/phone within reach Nurse Communication: Mobility status  GO    10/02/2013 Darrol Jump OTR/L Pager [phone] Office [phone]  Darrol Jump 10/02/2013, 11:51 AM

## 2013-10-02 NOTE — Progress Notes (Signed)
Physical Therapy Treatment Patient Details Name: [name] MRN: [id number] DOB: May 13, 1924 Today's Date: 10/02/2013 Time: 1031-5945 PT Time Calculation (min): 14 min  PT Assessment / Plan / Recommendation  History of Present Illness 80 y.o. admitted with GI bleed.   PT Comments   Patient continues to progress towards goals. Encouraged daily ambulation with staff or family  Follow Up Recommendations  Home health PT     Does the patient have the potential to tolerate intense rehabilitation     Barriers to Discharge        Equipment Recommendations  None recommended by PT    Recommendations for Other Services    Frequency Min 3X/week   Progress towards PT Goals Progress towards PT goals: Progressing toward goals  Plan      Precautions / Restrictions Precautions Precautions: Fall   Pertinent Vitals/Pain no apparent distress     Mobility  Bed Mobility Bed Mobility: Not assessed Transfers Sit to Stand: 5: Supervision;From chair/3-in-1 Stand to Sit: To chair/3-in-1;4: Min guard Details for Transfer Assistance: Assist to control descent to chair. Pt tends to "plop" back into chair. Ambulation/Gait Ambulation/Gait Assistance: 4: Min guard Ambulation Distance (Feet): 400 Feet Assistive device: Rolling walker Ambulation/Gait Assistance Details: Cues to avoid furniture and obstacles with RW Gait Pattern: Step-through pattern;Decreased stride length    Exercises     PT Diagnosis:    PT Problem List:   PT Treatment Interventions:     PT Goals (current goals can now be found in the care plan section) Acute Rehab PT Goals Patient Stated Goal: get back to what he was doing before  Visit Information  Last PT Received On: 10/02/13 Assistance Needed: +1 History of Present Illness: 80 y.o. admitted with GI bleed.    Subjective Data  Patient Stated Goal: get back to what he was doing before   Cognition  Cognition Arousal/Alertness: Awake/alert Behavior During  Therapy: WFL for tasks assessed/performed Overall Cognitive Status: Within Functional Limits for tasks assessed    Balance     End of Session PT - End of Session Equipment Utilized During Treatment: Gait belt Activity Tolerance: Patient tolerated treatment well Patient left: in chair;with call bell/phone within reach;with family/visitor present Nurse Communication: Mobility status   GP     Jacqualyn Posey 10/02/2013, 2:16 PM 10/02/2013 Jacqualyn Posey PTA [phone] pager [phone] office

## 2013-10-02 NOTE — Progress Notes (Signed)
[facility]                                                                                Patient Demographics  [name], is a 80 y.o. male, DOB - 01/21/1924, [id number]  Admit date - 09/23/2013   Admitting Physician Rigoberto Noel, MD  Outpatient Primary MD for the patient is [name], MD  LOS - 9   Chief Complaint  Patient presents with  . Vomiting  . Chest Pain        Assessment & Plan    Active Problems:   Acute blood loss anemia   Overweight (BMI 25.0-29.9)   Acute duodenal ulcer with bleeding   Acute esophagitis   Fatty liver   Atrial fibrillation with RVR   Elevated lipase   HTN (hypertension)   AKI (acute kidney injury)   Diabetes mellitus, type 2  Acute blood loss anemia secondary to GIB -Hb currently 8.6, with repeat 9 today -has received 4 units PRBCs since admit -Will continue to monitor every 12 hours. -Gastroenterology consulted, and if patient continues to bleed, patient will need surgical intervention  Acute duodenal ulcer with bleeding/Acute esophagitis  -no further bleeding thus far, although patient is high-risk for rebleeding  -last melanotic stool 12/29  -H Pylori IgG normal- IgA 6.8 -Continue Protonix 40 mg twice a day -Will continue full liquid diet and advance as tolerated  New onset Atrial fibrillation with RVR  -currently rate controlled  -Likely secondary to acute blood loss anemia -Given patient's current risk of bleeding, patient is not an anticoagulation candidate -ECHO Sept 2014 with normal EF and grade 1 diastolic dysfunction with normal atria   Elevated lipase  -associated with peripancreatic stranding at duodenum so suspect acute inflammation from ulcers likely cause  -Lipid: 100/116/24/53  Cough  -? Due to GERD vs asp PNA in setting of recent GIB with prior NGT and EGD x 2  -normal CXR  -SLP evaluation- recommended dysphagia 3 diet with thin liquids -Will add on tessalon pearls  AKI (acute kidney  injury)  -Resolved   HTN  -Controlled, will continue to monitor  Diabetes mellitus, type 2  -current CBGs controlled, will continue insulin sliding scale with CBG monitoring-was not on meds at home   Overweight (BMI 25.0-29.9)   Fatty liver  -seen on [place]  -TG normal on lipid panel  Code Status: full Family Communication: Daughter at bedside Disposition Plan: Admitted, will continue to monitor  Procedures  EGD 09/23/2013, 09/25/2013 NG tube placed 09/23/2013, discontinued 09/25/2013  Consults   Gastroenterology Interventional radiology General surgery  DVT Prophylaxis SCDs  Lab Results  Component Value Date   PLT 556* 10/02/2013    Medications  Scheduled Meds: . insulin aspart  0-15 Units Subcutaneous TID WC  . metoCLOPramide (REGLAN) injection  10 mg Intravenous Q6H  . pantoprazole (PROTONIX) IV  40 mg Intravenous BID   Continuous Infusions: . sodium chloride 20 mL/hr at 10/02/13 0535   PRN Meds:.acetaminophen, bisacodyl, diphenhydrAMINE, HYDROcodone-homatropine, menthol-cetylpyridinium, ondansetron  Antibiotics    Anti-infectives   Start     Dose/Rate Route Frequency Ordered Stop   09/24/13 1200  metroNIDAZOLE (FLAGYL) IVPB 500 mg  Status:  Discontinued     500 mg 100 mL/hr over 60 Minutes Intravenous Every 8 hours 09/24/13 0753 09/27/13 0917   09/24/13 1000  ciprofloxacin (CIPRO) IVPB 400 mg  Status:  Discontinued     400 mg 200 mL/hr over 60 Minutes Intravenous Every 12 hours 09/24/13 0018 09/24/13 0744   09/24/13 1000  ciprofloxacin (CIPRO) IVPB 400 mg  Status:  Discontinued     400 mg 200 mL/hr over 60 Minutes Intravenous Every 12 hours 09/24/13 0748 09/27/13 0917   09/24/13 0800  metroNIDAZOLE (FLAGYL) IVPB 500 mg  Status:  Discontinued     500 mg 100 mL/hr over 60 Minutes Intravenous Every 8 hours 09/24/13 0747 09/24/13 0753   09/24/13 0800  metroNIDAZOLE (FLAGYL) IVPB 500 mg  Status:  Discontinued     500 mg 100 mL/hr over 60 Minutes Intravenous  Every 8 hours 09/24/13 0748 09/24/13 0748   09/24/13 0600  metroNIDAZOLE (FLAGYL) IVPB 500 mg  Status:  Discontinued     500 mg 100 mL/hr over 60 Minutes Intravenous Every 8 hours 09/24/13 0014 09/24/13 0744   09/23/13 2100  ciprofloxacin (CIPRO) IVPB 400 mg     400 mg 200 mL/hr over 60 Minutes Intravenous  Once 09/23/13 2049 09/23/13 2337   09/23/13 2100  metroNIDAZOLE (FLAGYL) IVPB 500 mg     500 mg 100 mL/hr over 60 Minutes Intravenous  Once 09/23/13 2049 09/23/13 2336       Time Spent in minutes   25 minutes   Ranee Peasley D.O. on 10/02/2013 at 2:45 PM  Between 7am to 7pm - Pager - [phone]  After 7pm go to [email or web address] - password [email or web address]  And look for the night coverage person covering for me after hours  [facility] Office  [phone]    Subjective:   [name] seen and examined today.  Patient denies abdominal pain.  Still complains of cough.     Objective:   Filed Vitals:   10/01/13 1639 10/01/13 2120 10/02/13 0631 10/02/13 1408  BP: 145/66 164/67 162/86 134/51  Pulse: 94 100 94 94  Temp: 97.8 F (36.6 C) 98.2 F (36.8 C) 97.7 F (36.5 C) 97.3 F (36.3 C)  TempSrc: Oral Oral Oral Oral  Resp: 18 20 20 18   Height:      Weight:   77.5 kg (170 lb 13.7 oz)   SpO2: 99% 98% 98% 99%    Wt Readings from Last 3 Encounters:  10/02/13 77.5 kg (170 lb 13.7 oz)  10/02/13 77.5 kg (170 lb 13.7 oz)  10/02/13 77.5 kg (170 lb 13.7 oz)     Intake/Output Summary (Last 24 hours) at 10/02/13 1445 Last data filed at 10/02/13 1211  Gross per 24 hour  Intake    720 ml  Output   1850 ml  Net  -1130 ml    Exam  General: Well developed, well nourished, NAD, appears stated age  HEENT: NCAT, PERRLA, EOMI, Anicteic Sclera, mucous membranes moist.   Neck: Supple, no JVD, no masses  Cardiovascular: S1 S2 auscultated,Regular rate and rhythm.  Respiratory: Clear to auscultation bilaterally with equal chest rise  Abdomen: Soft, nontender,  nondistended, + bowel sounds  Extremities: warm dry without cyanosis clubbing or edema  Neuro: AAOx3, cranial nerves grossly intact.   Skin: Without rashes exudates or nodules  Psych: Normal affect and demeanor with intact judgement and insight  Data Review   Micro Results Recent Results (from the past 240 hour(s))  MRSA PCR SCREENING  Status: None   Collection Time    09/23/13 11:47 PM      Result Value Range Status   MRSA by PCR NEGATIVE  NEGATIVE Final   Comment:            The GeneXpert MRSA Assay (FDA     approved for NASAL specimens     only), is one component of a     comprehensive MRSA colonization     surveillance program. It is not     intended to diagnose MRSA     infection nor to guide or     monitor treatment for     MRSA infections.    Radiology Reports Dg Chest 1 View  09/23/2013   CLINICAL DATA:  Chest pain  EXAM: CHEST - 1 VIEW  COMPARISON:  04/22/2013  FINDINGS: The heart size and mediastinal contours are within normal limits. Both lungs are clear. The visualized skeletal structures are unremarkable.  IMPRESSION: No active disease.   Electronically Signed   By: Inez Catalina M.D.   On: 09/23/2013 12:22   Dg Chest 2 View  09/29/2013   CLINICAL DATA:  Cough  EXAM: CHEST  2 VIEW  COMPARISON:  09/25/2013  FINDINGS: The cardiac shadow is stable. A right-sided central venous line is again seen and stable. The lungs are well aerated. Mild patchy changes are noted bilaterally in the bases although stable from the prior study. The nasogastric catheter is been removed in the interval. No new focal abnormality is seen.  IMPRESSION: Stable bibasilar changes.  No new focal abnormality is noted.   Electronically Signed   By: Inez Catalina M.D.   On: 09/29/2013 11:19   Ct Abdomen Pelvis W Contrast  09/23/2013   CLINICAL DATA:  Vomiting, epigastric pain  EXAM: CT ABDOMEN AND PELVIS WITH CONTRAST  TECHNIQUE: Multidetector CT imaging of the abdomen and pelvis was performed  using the standard protocol following bolus administration of intravenous contrast.  CONTRAST:  168mL OMNIPAQUE IOHEXOL 300 MG/ML  SOLN  COMPARISON:  09/23/2013 abdominal ultrasound  FINDINGS: Minor dependent basilar atelectasis bilaterally. Normal heart size. No pericardial effusion. Trace left pleural effusion. Distal esophagus is patulous and fluid-filled, suspect esophageal dysmotility. Coronary calcifications noted. Degenerative changes of the lower thoracic spine with large osteophytes.  Abdomen: Mild nodularity to the liver surface compatible with cirrhosis. No biliary dilatation. Gallbladder, biliary system, pancreas, spleen, and adrenal glands are within normal limits for age and demonstrate no acute finding. Incidental splenic calcification compatible granulomatous disease, image 29.  The stomach and proximal duodenum are distended with hyperdense heterogeneous fluid. Third portion of the duodenum demonstrates intraluminal dense contrast. This is compatible with contrast extravasation/active duodenal bleeding. This portion of the duodenum demonstrates circumferential wall thickening. Appearance is compatible with bleeding from a duodenal ulcer with duodenitis versus a duodenum mass.  Kidneys demonstrate hypodense renal cysts bilaterally. Largest cyst in the left kidney midpole measures 13 mm, image 38. No renal obstruction or hydronephrosis. No obstructing urinary tract calculi.  Atherosclerotic changes of the aorta diffusely. Negative for aneurysm or dissection.  No free air, fluid collection, or abscess. Diffuse colonic diverticulosis present.  Pelvis: Extensive sigmoid region diverticulosis. No pelvic free fluid, fluid collection, hemorrhage, abscess, or adenopathy. No inguinal abnormality or hernia. Urinary bladder unremarkable. Previous right hip arthroplasty noted. Diffuse degenerative changes of the spine and pelvis.  IMPRESSION: Active contrast extravasation/ bleeding within the distal duodenum in  an area of duodenal wall thickening compatible with duodenal ulcer disease with duodenitis  versus a duodenal mass. Associated gastric outlet obstruction with marked distention of the stomach and proximal duodenum.  Bibasilar atelectasis and trace left pleural fluid  Probable mild hepatic cirrhosis  Aortic atherosclerosis without occlusion  Incidental renal cysts  Colonic diverticulosis  These results were called by telephone at the time of interpretation on 09/23/2013 at 6:35 PM to Dr. Varney Biles , who verbally acknowledged these results.   Electronically Signed   By: Daryll Brod M.D.   On: 09/23/2013 18:49   Ir Angiogram Visceral Selective  09/27/2013   CLINICAL DATA:  Gastrointestinal bleeding. Duodenal ulcer was clipped.  EXAM: SELECTIVE VISCERAL ARTERIOGRAPHY; ADDITIONAL ARTERIOGRAPHY; IR ULTRASOUND GUIDANCE VASC ACCESS RIGHT  MEDICATIONS AND MEDICAL HISTORY: Versed 5 mg, Fentanyl 25 mcg.  ANESTHESIA/SEDATION: Moderate sedation time: 25 minutes  CONTRAST:  80 cc Omnipaque 300  FLUOROSCOPY TIME:  4 min and 48 seconds.  PROCEDURE: The procedure, risks, benefits, and alternatives were explained to the patient. Questions regarding the procedure were encouraged and answered. The patient understands and consents to the procedure.  The right groin was prepped with Betadine in a sterile fashion, and a sterile drape was applied covering the operative field. A sterile gown and sterile gloves were used for the procedure.  Under sonographic guidance, a in 19 gauge needle was inserted into the common femoral artery and removed over a Bentson. A 5 French sheath was inserted. A cobra 2 catheter was advanced over the Bentson wire and into the celiac axis. Contrast was injected. It was advanced over a glidewire into the common hepatic artery and angiography was performed. The catheter was advanced into the gastroduodenal artery and repeat imaging was performed.  The catheter was retracted and advanced over a Bentson  wire into the SMA. Angiography was performed in the AP and bilateral oblique views. The cobra catheter was removed. Femoral angiography was performed. Exo seal was deployed for hemostasis without complication  COMPLICATIONS: None  FINDINGS: Injection of the celiac axis demonstrates vascular anatomy and no source of gastrointestinal bleeding. Specifically, injection of the gastroduodenal artery demonstrates no active extravasation of contrast or pseudoaneurysm. Of note, the endoscopic clips within the distal duodenum are apparently beyond the vascular distribution of the gastroduodenal artery.  Injection of the SMA demonstrates no source of active gastrointestinal bleeding. Jejunal branches leading towards the duodenal clips or noted. Embolization was not performed.  IMPRESSION: Gastrointestinal angiography demonstrates no source of bleeding. Both the celiac and SMA were injected. The region of the bleeding noted on endoscopy was beyond the distribution of the gastroduodenal artery, therefore empiric embolization was not performed. The source vessel also not be identified from the SMA injection.   Electronically Signed   By: Maryclare Bean M.D.   On: 09/27/2013 13:21   Ir Angiogram Visceral Selective  09/27/2013   CLINICAL DATA:  Gastrointestinal bleeding. Duodenal ulcer was clipped.  EXAM: SELECTIVE VISCERAL ARTERIOGRAPHY; ADDITIONAL ARTERIOGRAPHY; IR ULTRASOUND GUIDANCE VASC ACCESS RIGHT  MEDICATIONS AND MEDICAL HISTORY: Versed 5 mg, Fentanyl 25 mcg.  ANESTHESIA/SEDATION: Moderate sedation time: 25 minutes  CONTRAST:  80 cc Omnipaque 300  FLUOROSCOPY TIME:  4 min and 48 seconds.  PROCEDURE: The procedure, risks, benefits, and alternatives were explained to the patient. Questions regarding the procedure were encouraged and answered. The patient understands and consents to the procedure.  The right groin was prepped with Betadine in a sterile fashion, and a sterile drape was applied covering the operative field. A  sterile gown and sterile gloves were used for the procedure.  Under sonographic guidance, a in 19 gauge needle was inserted into the common femoral artery and removed over a Bentson. A 5 French sheath was inserted. A cobra 2 catheter was advanced over the Bentson wire and into the celiac axis. Contrast was injected. It was advanced over a glidewire into the common hepatic artery and angiography was performed. The catheter was advanced into the gastroduodenal artery and repeat imaging was performed.  The catheter was retracted and advanced over a Bentson wire into the SMA. Angiography was performed in the AP and bilateral oblique views. The cobra catheter was removed. Femoral angiography was performed. Exo seal was deployed for hemostasis without complication  COMPLICATIONS: None  FINDINGS: Injection of the celiac axis demonstrates vascular anatomy and no source of gastrointestinal bleeding. Specifically, injection of the gastroduodenal artery demonstrates no active extravasation of contrast or pseudoaneurysm. Of note, the endoscopic clips within the distal duodenum are apparently beyond the vascular distribution of the gastroduodenal artery.  Injection of the SMA demonstrates no source of active gastrointestinal bleeding. Jejunal branches leading towards the duodenal clips or noted. Embolization was not performed.  IMPRESSION: Gastrointestinal angiography demonstrates no source of bleeding. Both the celiac and SMA were injected. The region of the bleeding noted on endoscopy was beyond the distribution of the gastroduodenal artery, therefore empiric embolization was not performed. The source vessel also not be identified from the SMA injection.   Electronically Signed   By: Maryclare Bean M.D.   On: 09/27/2013 13:21   Ir Angiogram Selective Each Additional Vessel  09/27/2013   CLINICAL DATA:  Gastrointestinal bleeding. Duodenal ulcer was clipped.  EXAM: SELECTIVE VISCERAL ARTERIOGRAPHY; ADDITIONAL ARTERIOGRAPHY; IR  ULTRASOUND GUIDANCE VASC ACCESS RIGHT  MEDICATIONS AND MEDICAL HISTORY: Versed 5 mg, Fentanyl 25 mcg.  ANESTHESIA/SEDATION: Moderate sedation time: 25 minutes  CONTRAST:  80 cc Omnipaque 300  FLUOROSCOPY TIME:  4 min and 48 seconds.  PROCEDURE: The procedure, risks, benefits, and alternatives were explained to the patient. Questions regarding the procedure were encouraged and answered. The patient understands and consents to the procedure.  The right groin was prepped with Betadine in a sterile fashion, and a sterile drape was applied covering the operative field. A sterile gown and sterile gloves were used for the procedure.  Under sonographic guidance, a in 19 gauge needle was inserted into the common femoral artery and removed over a Bentson. A 5 French sheath was inserted. A cobra 2 catheter was advanced over the Bentson wire and into the celiac axis. Contrast was injected. It was advanced over a glidewire into the common hepatic artery and angiography was performed. The catheter was advanced into the gastroduodenal artery and repeat imaging was performed.  The catheter was retracted and advanced over a Bentson wire into the SMA. Angiography was performed in the AP and bilateral oblique views. The cobra catheter was removed. Femoral angiography was performed. Exo seal was deployed for hemostasis without complication  COMPLICATIONS: None  FINDINGS: Injection of the celiac axis demonstrates vascular anatomy and no source of gastrointestinal bleeding. Specifically, injection of the gastroduodenal artery demonstrates no active extravasation of contrast or pseudoaneurysm. Of note, the endoscopic clips within the distal duodenum are apparently beyond the vascular distribution of the gastroduodenal artery.  Injection of the SMA demonstrates no source of active gastrointestinal bleeding. Jejunal branches leading towards the duodenal clips or noted. Embolization was not performed.  IMPRESSION: Gastrointestinal  angiography demonstrates no source of bleeding. Both the celiac and SMA were injected. The region of the bleeding noted  on endoscopy was beyond the distribution of the gastroduodenal artery, therefore empiric embolization was not performed. The source vessel also not be identified from the SMA injection.   Electronically Signed   By: Maryclare Bean M.D.   On: 09/27/2013 13:21   US Abdomen Limited  09/23/2013   CLINICAL DATA:  Vomiting  EXAM: US ABDOMEN LIMITED - RIGHT UPPER QUADRANT  COMPARISON:  None.  FINDINGS: Gallbladder  No gallstones or wall thickening visualized. There is no pericholecystic fluid. No sonographic Murphy sign noted.  Common bile duct  Diameter: 3 mm. There is no intrahepatic or extrahepatic biliary duct dilatation.  Liver:  No focal lesion identified. Liver echogenicity is somewhat increased and inhomogeneous.  IMPRESSION: Liver echogenicity is somewhat increased and inhomogeneous. This appearance is consistent with fatty change and/or underlying parenchymal disease. While no focal liver lesions are identified, it must be cautioned that the sensitivity of ultrasound for focal liver lesions is diminished in this circumstance. Study is otherwise unremarkable.   Electronically Signed   By: Lowella Grip M.D.   On: 09/23/2013 14:48   Ir US Guide Vasc Access Right  09/27/2013   CLINICAL DATA:  Gastrointestinal bleeding. Duodenal ulcer was clipped.  EXAM: SELECTIVE VISCERAL ARTERIOGRAPHY; ADDITIONAL ARTERIOGRAPHY; IR ULTRASOUND GUIDANCE VASC ACCESS RIGHT  MEDICATIONS AND MEDICAL HISTORY: Versed 5 mg, Fentanyl 25 mcg.  ANESTHESIA/SEDATION: Moderate sedation time: 25 minutes  CONTRAST:  80 cc Omnipaque 300  FLUOROSCOPY TIME:  4 min and 48 seconds.  PROCEDURE: The procedure, risks, benefits, and alternatives were explained to the patient. Questions regarding the procedure were encouraged and answered. The patient understands and consents to the procedure.  The right groin was prepped with  Betadine in a sterile fashion, and a sterile drape was applied covering the operative field. A sterile gown and sterile gloves were used for the procedure.  Under sonographic guidance, a in 19 gauge needle was inserted into the common femoral artery and removed over a Bentson. A 5 French sheath was inserted. A cobra 2 catheter was advanced over the Bentson wire and into the celiac axis. Contrast was injected. It was advanced over a glidewire into the common hepatic artery and angiography was performed. The catheter was advanced into the gastroduodenal artery and repeat imaging was performed.  The catheter was retracted and advanced over a Bentson wire into the SMA. Angiography was performed in the AP and bilateral oblique views. The cobra catheter was removed. Femoral angiography was performed. Exo seal was deployed for hemostasis without complication  COMPLICATIONS: None  FINDINGS: Injection of the celiac axis demonstrates vascular anatomy and no source of gastrointestinal bleeding. Specifically, injection of the gastroduodenal artery demonstrates no active extravasation of contrast or pseudoaneurysm. Of note, the endoscopic clips within the distal duodenum are apparently beyond the vascular distribution of the gastroduodenal artery.  Injection of the SMA demonstrates no source of active gastrointestinal bleeding. Jejunal branches leading towards the duodenal clips or noted. Embolization was not performed.  IMPRESSION: Gastrointestinal angiography demonstrates no source of bleeding. Both the celiac and SMA were injected. The region of the bleeding noted on endoscopy was beyond the distribution of the gastroduodenal artery, therefore empiric embolization was not performed. The source vessel also not be identified from the SMA injection.   Electronically Signed   By: Maryclare Bean M.D.   On: 09/27/2013 13:21   Dg Chest Port 1 View  09/25/2013   CLINICAL DATA:  Central line placement.  EXAM: PORTABLE CHEST - 1 VIEW   COMPARISON:  Chest radiograph performed 09/23/2013  FINDINGS: The patient's right IJ line is noted ending about the mid to distal SVC. The enteric tube is noted extending below the diaphragm.  The lungs are hypoexpanded. Mild bibasilar atelectasis is noted. No pleural effusion or pneumothorax is seen.  The cardiomediastinal silhouette is normal in size. No acute osseous abnormalities are identified.  IMPRESSION: 1. Right IJ line noted ending about the mid to distal SVC. 2. Lungs hypoexpanded, with mild bibasilar atelectasis.   Electronically Signed   By: Garald Balding M.D.   On: 09/25/2013 00:17   Dg Abd Portable 1v  09/26/2013   CLINICAL DATA:  Abdominal pain  EXAM: PORTABLE ABDOMEN - 1 VIEW  COMPARISON:  Abdominal films of September 25, 2013.  FINDINGS: The bowel gas pattern does not suggest obstruction or ileus. No abnormal extraluminal gas collections are demonstrated. Portions of the left aspect of the abdomen are excluded from the field-of-view. There is radiodense material in the region of the urinary bladder likely from the previous CT scan. There are surgical clips overlying the right and left L2 transverse processes.  IMPRESSION: There is no evidence of bowel obstruction or ileus. A three-way abdominal series may be useful if further plain film imaging is desired.   Electronically Signed   By: David  Martinique   On: 09/26/2013 11:05   Dg Abd Portable 1v  09/25/2013   CLINICAL DATA:  Gastric bleeding. Concern for small bowel obstruction.  EXAM: PORTABLE ABDOMEN - 1 VIEW  COMPARISON:  CT of the abdomen and pelvis performed 09/23/2013  FINDINGS: The visualized bowel gas pattern is grossly unremarkable. There is no evidence of bowel obstruction. Known active bleeding at the duodenum is not characterized on radiograph. The patient's enteric tube is noted overlying the body of the stomach. No free intra-abdominal air is identified, though evaluation for free air is suboptimal on supine views.  No acute  osseous abnormalities are seen. Minimal degenerative change is noted along the lumbar spine. The patient's right hip prosthesis is grossly unremarkable in appearance, though incompletely imaged. Contrast is seen filling the bladder.  IMPRESSION: Unremarkable bowel gas pattern; no free intra-abdominal air seen.   Electronically Signed   By: Garald Balding M.D.   On: 09/25/2013 02:33   Ct Angio Abd/pel W/ And/or W/o  09/25/2013   CLINICAL DATA:  Upper GI bleed; bright red blood from nasogastric tube.  EXAM: CT ANGIOGRAPHY ABDOMEN AND PELVIS WITH CONTRAST AND WITHOUT CONTRAST  TECHNIQUE: Multidetector CT imaging of the abdomen and pelvis was performed using the standard protocol during bolus administration of intravenous contrast. Multiplanar reconstructed images including MIPs were obtained and reviewed to evaluate the vascular anatomy.  CONTRAST:  14mL OMNIPAQUE IOHEXOL 350 MG/ML SOLN  COMPARISON:  CT of the abdomen and pelvis performed 09/23/2013  FINDINGS: Trace bilateral pleural effusions are seen, left greater than right, with bibasilar airspace opacification likely reflecting atelectasis.  The known focus of acute duodenal hemorrhage is better characterized on the prior study, as arterial phase images remain too early to visualize active contrast extravasation at the duodenum. As before, there is apparent wall thickening at the second segment of the duodenum; this may reflect an underlying mass or a bleeding duodenal ulcer and associated duodenitis. Mildly heterogeneous blood is again seen distending the duodenum, with the duodenum measuring up to 5.0 cm in diameter. This appearance is grossly unchanged from the prior study.  The patient's enteric tube is seen ending at the body of the stomach. The stomach is  largely decompressed, aside from a small amount of high attenuation fluid in the antrum, likely reflecting blood tracking from the duodenum.  The nodular contour of the liver is compatible with  cirrhotic change. Minimal residual contrast is noted at the Phrygian cap of the gallbladder. The spleen is diminutive, with a few calcified granulomata. There is mild diffuse soft tissue stranding about the pancreas, more prominent than on the prior study. This may reflect the duodenal process, though would correlate with lipase levels. Peripancreatic nodes remain stable in size. The adrenal glands are unremarkable in appearance.  Mild calcification is noted along the abdominal aorta and its branches. There is slight ectasia of the distal abdominal aorta, without evidence of aneurysmal dilatation. The celiac trunk, superior mesenteric artery, bilateral renal arteries and inferior mesenteric artery remain patent. The inferior vena cava is grossly unremarkable in appearance.  Nonspecific perinephric stranding is noted bilaterally. Scarring is noted near the upper pole of the left kidney. Small bilateral renal cysts are seen. The kidneys are otherwise unremarkable. There is no evidence of hydronephrosis. No renal or ureteral stones are seen.  No free fluid is identified. The small bowel is unremarkable in appearance. The stomach is within normal limits. No acute vascular abnormalities are seen.  The appendix is grossly unremarkable in appearance, without evidence for appendicitis. Scattered diverticulosis is noted along the entirety of the colon, without evidence of diverticulitis.  The bladder is mildly distended and grossly unremarkable in appearance. Mildly increased attenuation within the bladder may reflect residual contrast from the prior study. The prostate remains normal in size, with scattered calcification. No inguinal lymphadenopathy is seen.  No acute osseous abnormalities are identified. The patient's right hip prosthesis is incompletely imaged but appears grossly unremarkable.  Review of the MIP images confirms the above findings.  IMPRESSION: 1. Known focus of acute duodenal hemorrhage is better  characterized on the prior study, as arterial phase images remain too early to visualize active contrast extravasation at the duodenum. As before, there is apparent wall thickening of the second segment of the duodenum. This may reflect an underlying mass or bleeding duodenal ulcer, and associated duodenitis. Mildly heterogeneous blood again seen distending the duodenum and tracking back into the gastric antrum. The duodenum measures up to 5.0 cm in diameter, relatively stable from the prior study. 2. Mildly increased diffuse soft tissue stranding about the pancreas. This may reflect the duodenal process, though would correlate with lipase levels to exclude mild pancreatitis. 3. Trace bilateral pleural effusions, left greater than right, with bibasilar airspace opacification likely reflecting atelectasis. 4. Changes of hepatic cirrhosis noted. 5. Mild calcification along the abdominal aorta and its branches. 6. Small bilateral renal cysts; scarring near the upper pole of the left kidney. 7. Scattered diverticulosis along the entirety of the colon, without evidence of diverticulitis.   Electronically Signed   By: Garald Balding M.D.   On: 09/25/2013 03:59    CBC  Recent Labs Lab 10/01/13 1000 10/01/13 1900 10/02/13 0045 10/02/13 0735 10/02/13 1255  WBC 8.2 8.2 10.3 8.6 9.2  HGB 7.6* 9.1* 8.6* 9.0* 9.0*  HCT 22.1* 25.5* 25.4* 26.8* 25.9*  PLT 406* 423* 440* 484* 556*  MCV 91.3 89.5 90.7 91.2 90.9  MCH 31.4 31.9 30.7 30.6 31.6  MCHC 34.4 35.7 33.9 33.6 34.7  RDW 16.3* 16.4* 16.7* 16.8* 16.8*    Chemistries   Recent Labs Lab 09/26/13 0500 09/27/13 0215 09/29/13 0500 10/01/13 1000 10/02/13 0735  NA 145 140 139 140 140  K  3.6 3.5 3.9 4.0 3.7  CL 115* 110 106 108 104  CO2 24 21 21 21 22   GLUCOSE 104* 99 111* 179* 115*  BUN 48* 32* 22 21 17   CREATININE 1.42* 1.15 1.05 1.02 0.97  CALCIUM 7.2* 7.6* 8.0* 8.5 8.5  MG  --  2.3  --   --   --   AST  --   --  28  --   --   ALT  --   --  12   --   --   ALKPHOS  --   --  40  --   --   BILITOT  --   --  0.6  --   --    ------------------------------------------------------------------------------------------------------------------ estimated creatinine clearance is 53.7 ml/min (by C-G formula based on Cr of 0.97). ------------------------------------------------------------------------------------------------------------------ No results found for this basename: HGBA1C,  in the last 72 hours ------------------------------------------------------------------------------------------------------------------ No results found for this basename: CHOL, HDL, LDLCALC, TRIG, CHOLHDL, LDLDIRECT,  in the last 72 hours ------------------------------------------------------------------------------------------------------------------ No results found for this basename: TSH, T4TOTAL, FREET3, T3FREE, THYROIDAB,  in the last 72 hours ------------------------------------------------------------------------------------------------------------------ No results found for this basename: VITAMINB12, FOLATE, FERRITIN, TIBC, IRON, RETICCTPCT,  in the last 72 hours  Coagulation profile  Recent Labs Lab 09/26/13 1345  INR 1.37    No results found for this basename: DDIMER,  in the last 72 hours  Cardiac Enzymes No results found for this basename: CK, CKMB, TROPONINI, MYOGLOBIN,  in the last 168 hours ------------------------------------------------------------------------------------------------------------------ No components found with this basename: POCBNP,

## 2013-10-03 ENCOUNTER — Inpatient Hospital Stay (HOSPITAL_COMMUNITY): Payer: Medicare Other

## 2013-10-03 LAB — CBC
HCT: 25.2 % — ABNORMAL LOW (ref 39.0–52.0)
HCT: 27.7 % — ABNORMAL LOW (ref 39.0–52.0)
HCT: 28.2 % — ABNORMAL LOW (ref 39.0–52.0)
Hemoglobin: 8.4 g/dL — ABNORMAL LOW (ref 13.0–17.0)
Hemoglobin: 9.1 g/dL — ABNORMAL LOW (ref 13.0–17.0)
Hemoglobin: 9.2 g/dL — ABNORMAL LOW (ref 13.0–17.0)
MCH: 30.5 pg (ref 26.0–34.0)
MCH: 30.1 pg (ref 26.0–34.0)
MCH: 30.4 pg (ref 26.0–34.0)
MCHC: 33.3 g/dL (ref 30.0–36.0)
MCHC: 32.9 g/dL (ref 30.0–36.0)
MCHC: 32.6 g/dL (ref 30.0–36.0)
MCV: 91.6 fL (ref 78.0–100.0)
MCV: 91.7 fL (ref 78.0–100.0)
MCV: 93.1 fL (ref 78.0–100.0)
Platelets: 488 10*3/uL — ABNORMAL HIGH (ref 150–400)
Platelets: 575 10*3/uL — ABNORMAL HIGH (ref 150–400)
Platelets: 595 10*3/uL — ABNORMAL HIGH (ref 150–400)
RBC: 2.75 MIL/uL — ABNORMAL LOW (ref 4.22–5.81)
RBC: 3.02 MIL/uL — ABNORMAL LOW (ref 4.22–5.81)
RBC: 3.03 MIL/uL — ABNORMAL LOW (ref 4.22–5.81)
RDW: 16.3 % — ABNORMAL HIGH (ref 11.5–15.5)
RDW: 16 % — ABNORMAL HIGH (ref 11.5–15.5)
RDW: 16.2 % — ABNORMAL HIGH (ref 11.5–15.5)
WBC: 7.6 10*3/uL (ref 4.0–10.5)
WBC: 12.6 10*3/uL — ABNORMAL HIGH (ref 4.0–10.5)
WBC: 11.6 10*3/uL — ABNORMAL HIGH (ref 4.0–10.5)

## 2013-10-03 LAB — GLUCOSE, CAPILLARY
Glucose-Capillary: 91 mg/dL (ref 70–99)
Glucose-Capillary: 114 mg/dL — ABNORMAL HIGH (ref 70–99)
Glucose-Capillary: 127 mg/dL — ABNORMAL HIGH (ref 70–99)
Glucose-Capillary: 90 mg/dL (ref 70–99)

## 2013-10-03 LAB — URINALYSIS, ROUTINE W REFLEX MICROSCOPIC
Bilirubin Urine: NEGATIVE
Glucose, UA: NEGATIVE mg/dL
Hgb urine dipstick: NEGATIVE
Ketones, ur: NEGATIVE mg/dL
Leukocytes, UA: NEGATIVE
Nitrite: NEGATIVE
Protein, ur: NEGATIVE mg/dL
Specific Gravity, Urine: 1.015 (ref 1.005–1.030)
Urobilinogen, UA: 1 mg/dL (ref 0.0–1.0)
pH: 5.5 (ref 5.0–8.0)

## 2013-10-03 MED ORDER — BENZONATATE 100 MG PO CAPS
200.0000 mg | ORAL_CAPSULE | Freq: Three times a day (TID) | ORAL | Status: DC | PRN
  Filled 2013-10-03: qty 2

## 2013-10-03 MED ORDER — PANTOPRAZOLE SODIUM 40 MG PO TBEC
40.0000 mg | DELAYED_RELEASE_TABLET | Freq: Two times a day (BID) | ORAL | Status: DC
  Administered 2013-10-03 – 2013-10-05 (×4): 40 mg via ORAL
  Filled 2013-10-03 (×4): qty 1

## 2013-10-03 NOTE — Progress Notes (Signed)
    Progress Note   Subjective  No complaints. No black stools.    Objective   Vital signs in last 24 hours: Temp:  [97.3 F (36.3 C)-98.1 F (36.7 C)] 97.8 F (36.6 C) (01/03 0516) Pulse Rate:  [79-95] 79 (01/03 0516) Resp:  [18-20] 20 (01/03 0516) BP: (109-144)/(39-56) 144/56 mmHg (01/03 0516) SpO2:  [94 %-99 %] 97 % (01/03 0516) Weight:  [171 lb 11.8 oz (77.9 kg)] 171 lb 11.8 oz (77.9 kg) (01/03 0710) Last BM Date: 10/03/13 General:    white male in NAD Abdomen:  Soft, nontender and nondistended. Normal bowel sounds. Neurologic:  Alert and oriented,  grossly normal neurologically. Psych:  Cooperative. Normal mood and affect.  Lab Results:  Recent Labs  10/03/13 0206 10/03/13 0815 10/03/13 1214  WBC 7.6 12.6* 11.6*  HGB 8.4* 9.1* 9.2*  HCT 25.2* 27.7* 28.2*  PLT 488* 575* 595*   BMET  Recent Labs  10/01/13 1000 10/02/13 0735  NA 140 140  K 4.0 3.7  CL 108 104  CO2 21 22  GLUCOSE 179* 115*  BUN 21 17  CREATININE 1.02 0.97  CALCIUM 8.5 8.5     Assessment / Plan:   1. Recent upper GI bleed secondary to duodenal ulcer with large visible vessel 12/26. Multiple other duodenal ulcers found ( biopsies raise suspicion for ischemia). Endoclip was placed on visible vessel. Following that, IR did mesenteric angio but no active bleeding found so no embolization done. Patient given a 3rd unit of blood 10/01/13, hgb rose appropriately and has since remained around 9.0. Change IV PPI to PO. Getting solid food.  2. Anemia of acute blood loss, s/p 3 units of blood. Hgb stable now.   3. Cirrhosis by CTscan     LOS: 10 days   Tye Savoy  10/03/2013, 1:30 PM   Agree with Ms. Guenther's assessment and plan. He has stabilized. If rebleeds then would need surgery to treat versus IR. Gatha Mayer, MD, [facility]

## 2013-10-03 NOTE — Progress Notes (Signed)
Speech Language Pathology Treatment: Dysphagia  Patient Details Name: JORDI LACKO MRN: [id number] DOB: 1923-10-25 Today's Date: 10/03/2013 Time: 0737-1062 SLP Time Calculation (min): 16 min  Assessment / Plan / Recommendation Clinical Impression  F/u diet tolerance assessment revealed patient with overall good tolerance of recommended diet. Now advanced by MD to dysphagia 3, thin liquids. Able to self feed provided po trials with moderate cueing for use of multiple swallows despite ability to verbalize independently at beginning of session. SLP will continue to f/u for carryover of strategies.    HPI HPI: 80 year old male with PMH of CVA and GERD, initially admitted by the critical care service after presenting with signs consistent with acute GI bleeding and was also hypotensive and required pressors. CT of the abdomen done at time of admission revealed a gastric outlet obstruction with marked distention of the stomach and proximal duodenum with contrast extravasation into the distal duodenum. There was also associated peripancreatic stranding in the area near the duodenum. Patient underwent an initial EGD on 09/23/2013 that revealed esophagitis and multiple shallow duodenal ulcerations. A second EGD was completed on 09/25/2013 which revealed a massive duodenal ulcer with a visible vessel which was clipped. The patient subsequently was evaluated by IR for angiography and possible embolization in setting of ongoing active bleeding. There was also concern that the patient may have been experiencing mesenteric ischemia but an angiogram revealed patent SMA and celiac branches with no source of GI bleeding defined. Patient eventually stabilized from a hemodynamic standpoint. Hemoglobin has remained stable.       SLP Plan  Continue with current plan of care    Recommendations Diet recommendations: Dysphagia 3 (mechanical soft);Thin liquid Liquids provided via: Cup Medication Administration: Whole meds  with puree Supervision: Patient able to self feed;Full supervision/cueing for compensatory strategies Compensations: Slow rate;Small sips/bites;Multiple dry swallows after each bite/sip Postural Changes and/or Swallow Maneuvers: Seated upright 90 degrees;Upright 30-60 min after meal              Oral Care Recommendations: Oral care BID Follow up Recommendations: None Plan: Continue with current plan of care    [place], [place] [phone]   Taneia Mealor Meryl 10/03/2013, 11:46 AM

## 2013-10-03 NOTE — Progress Notes (Signed)
[facility]                                                                                Patient Demographics  [name], is a 80 y.o. male, DOB - 1924/04/05, [id number]  Admit date - 09/23/2013   Admitting Physician Rigoberto Noel, MD  Outpatient Primary MD for the patient is [name], MD  LOS - 10   Chief Complaint  Patient presents with  . Vomiting  . Chest Pain      Interim History This is an 80 year old male with a history of hypertension, stroke, GERD presented to the hospital for GI bleeding. EGD was conducted showing multiple duodenal ulcers. Patient was initially admitted to the ICU due to risk of rebleeding. Currently patient's hemoglobin has appeared to remain stable, however he should receive 4 units of blood since admission. Patient has not had any evidence of overt bleeding, he is currently on PPI treatment. Her GI, definitive treatment would be surgically if patient rebleeds. IR did some coiling, this was abandoned due to to SMA territory.  Assessment & Plan    Active Problems:   Acute blood loss anemia   Overweight (BMI 25.0-29.9)   Acute duodenal ulcer with bleeding   Acute esophagitis   Fatty liver   Atrial fibrillation with RVR   Elevated lipase   HTN (hypertension)   AKI (acute kidney injury)   Diabetes mellitus, type 2  Acute blood loss anemia secondary to GIB -Hb currently 8.4, with repeat 9.1 today -has received 4 units PRBCs since admit -Will continue to monitor every 12 hours. -Gastroenterology consulted, and if patient continues to bleed, patient will need surgical intervention  Acute duodenal ulcer with bleeding/Acute esophagitis  -no further bleeding thus far, although patient is high-risk for rebleeding  -last melanotic stool 12/29  -H Pylori IgG normal- IgA 6.8 -Continue Protonix 40 mg twice a day -on dysphagia 3 diet  New onset Atrial fibrillation with RVR  -currently rate controlled  -Likely secondary to acute  blood loss anemia -Given patient's current risk of bleeding, patient is not an anticoagulation candidate -ECHO Sept 2014 with normal EF and grade 1 diastolic dysfunction with normal atria   Elevated lipase  -associated with peripancreatic stranding at duodenum so suspect acute inflammation from ulcers likely cause  -Lipid: 100/116/24/53  Cough  -? Due to GERD vs asp PNA in setting of recent GIB with prior NGT and EGD x 2  -normal CXR  -SLP evaluation- recommended dysphagia 3 diet with thin liquids -Continue tessalon pearls  Leukocytosis -WBC 12.6 -will obtain CXR and UA -currently afebrile  AKI (acute kidney injury)  -Resolved   HTN  -Controlled, will continue to monitor  Diabetes mellitus, type 2  -current CBGs controlled, will continue insulin sliding scale with CBG monitoring-was not on meds at home   Overweight (BMI 25.0-29.9)   Fatty liver  -seen on [place]  -TG normal on lipid panel  Code Status: full Family Communication: Daughter at bedside Disposition Plan: Admitted, will continue to monitor  Procedures  EGD 09/23/2013, 09/25/2013 NG tube placed 09/23/2013, discontinued 09/25/2013  Consults   Gastroenterology Interventional radiology General surgery  DVT Prophylaxis SCDs  Lab  Results  Component Value Date   PLT 575* 10/03/2013    Medications  Scheduled Meds: . insulin aspart  0-15 Units Subcutaneous TID WC  . metoCLOPramide (REGLAN) injection  10 mg Intravenous Q6H  . pantoprazole (PROTONIX) IV  40 mg Intravenous BID   Continuous Infusions: . sodium chloride 20 mL/hr at 10/02/13 0535   PRN Meds:.acetaminophen, benzonatate, bisacodyl, diphenhydrAMINE, HYDROcodone-homatropine, menthol-cetylpyridinium, ondansetron  Antibiotics    Anti-infectives   Start     Dose/Rate Route Frequency Ordered Stop   09/24/13 1200  metroNIDAZOLE (FLAGYL) IVPB 500 mg  Status:  Discontinued     500 mg 100 mL/hr over 60 Minutes Intravenous Every 8 hours 09/24/13 0753  09/27/13 0917   09/24/13 1000  ciprofloxacin (CIPRO) IVPB 400 mg  Status:  Discontinued     400 mg 200 mL/hr over 60 Minutes Intravenous Every 12 hours 09/24/13 0018 09/24/13 0744   09/24/13 1000  ciprofloxacin (CIPRO) IVPB 400 mg  Status:  Discontinued     400 mg 200 mL/hr over 60 Minutes Intravenous Every 12 hours 09/24/13 0748 09/27/13 0917   09/24/13 0800  metroNIDAZOLE (FLAGYL) IVPB 500 mg  Status:  Discontinued     500 mg 100 mL/hr over 60 Minutes Intravenous Every 8 hours 09/24/13 0747 09/24/13 0753   09/24/13 0800  metroNIDAZOLE (FLAGYL) IVPB 500 mg  Status:  Discontinued     500 mg 100 mL/hr over 60 Minutes Intravenous Every 8 hours 09/24/13 0748 09/24/13 0748   09/24/13 0600  metroNIDAZOLE (FLAGYL) IVPB 500 mg  Status:  Discontinued     500 mg 100 mL/hr over 60 Minutes Intravenous Every 8 hours 09/24/13 0014 09/24/13 0744   09/23/13 2100  ciprofloxacin (CIPRO) IVPB 400 mg     400 mg 200 mL/hr over 60 Minutes Intravenous  Once 09/23/13 2049 09/23/13 2337   09/23/13 2100  metroNIDAZOLE (FLAGYL) IVPB 500 mg     500 mg 100 mL/hr over 60 Minutes Intravenous  Once 09/23/13 2049 09/23/13 2336       Time Spent in minutes   25 minutes   Shaelyn Decarli D.O. on 10/03/2013 at 12:54 PM  Between 7am to 7pm - Pager - [phone]  After 7pm go to [email or web address] - password [email or web address]  And look for the night coverage person covering for me after hours  [facility] Office  [phone]    Subjective:   [name] seen and examined today.  Patient denies abdominal pain.  Still complains of cough.     Objective:   Filed Vitals:   10/02/13 1408 10/02/13 2133 10/03/13 0516 10/03/13 0710  BP: 134/51 109/39 144/56   Pulse: 94 95 79   Temp: 97.3 F (36.3 C) 98.1 F (36.7 C) 97.8 F (36.6 C)   TempSrc: Oral Oral Oral   Resp: 18 20 20    Height:      Weight:    77.9 kg (171 lb 11.8 oz)  SpO2: 99% 94% 97%     Wt Readings from Last 3 Encounters:  10/03/13 77.9 kg (171  lb 11.8 oz)  10/03/13 77.9 kg (171 lb 11.8 oz)  10/03/13 77.9 kg (171 lb 11.8 oz)     Intake/Output Summary (Last 24 hours) at 10/03/13 1253 Last data filed at 10/03/13 0910  Gross per 24 hour  Intake    720 ml  Output    900 ml  Net   -180 ml    Exam  General: Well developed, well nourished, NAD, appears stated age  9: NCAT,  PERRLA, EOMI, Anicteic Sclera, mucous membranes moist.   Neck: Supple, no JVD, no masses  Cardiovascular: S1 S2 auscultated,Regular rate and rhythm.  Respiratory: Clear to auscultation bilaterally with equal chest rise  Abdomen: Soft, nontender, nondistended, + bowel sounds  Extremities: warm dry without cyanosis clubbing or edema  Neuro: AAOx3, cranial nerves grossly intact.   Skin: Without rashes exudates or nodules  Psych: Normal affect and demeanor with intact judgement and insight  Data Review   Micro Results Recent Results (from the past 240 hour(s))  MRSA PCR SCREENING     Status: None   Collection Time    09/23/13 11:47 PM      Result Value Range Status   MRSA by PCR NEGATIVE  NEGATIVE Final   Comment:            The GeneXpert MRSA Assay (FDA     approved for NASAL specimens     only), is one component of a     comprehensive MRSA colonization     surveillance program. It is not     intended to diagnose MRSA     infection nor to guide or     monitor treatment for     MRSA infections.    Radiology Reports Dg Chest 1 View  09/23/2013   CLINICAL DATA:  Chest pain  EXAM: CHEST - 1 VIEW  COMPARISON:  04/22/2013  FINDINGS: The heart size and mediastinal contours are within normal limits. Both lungs are clear. The visualized skeletal structures are unremarkable.  IMPRESSION: No active disease.   Electronically Signed   By: Inez Catalina M.D.   On: 09/23/2013 12:22   Dg Chest 2 View  09/29/2013   CLINICAL DATA:  Cough  EXAM: CHEST  2 VIEW  COMPARISON:  09/25/2013  FINDINGS: The cardiac shadow is stable. A right-sided central  venous line is again seen and stable. The lungs are well aerated. Mild patchy changes are noted bilaterally in the bases although stable from the prior study. The nasogastric catheter is been removed in the interval. No new focal abnormality is seen.  IMPRESSION: Stable bibasilar changes.  No new focal abnormality is noted.   Electronically Signed   By: Inez Catalina M.D.   On: 09/29/2013 11:19   Ct Abdomen Pelvis W Contrast  09/23/2013   CLINICAL DATA:  Vomiting, epigastric pain  EXAM: CT ABDOMEN AND PELVIS WITH CONTRAST  TECHNIQUE: Multidetector CT imaging of the abdomen and pelvis was performed using the standard protocol following bolus administration of intravenous contrast.  CONTRAST:  19mL OMNIPAQUE IOHEXOL 300 MG/ML  SOLN  COMPARISON:  09/23/2013 abdominal ultrasound  FINDINGS: Minor dependent basilar atelectasis bilaterally. Normal heart size. No pericardial effusion. Trace left pleural effusion. Distal esophagus is patulous and fluid-filled, suspect esophageal dysmotility. Coronary calcifications noted. Degenerative changes of the lower thoracic spine with large osteophytes.  Abdomen: Mild nodularity to the liver surface compatible with cirrhosis. No biliary dilatation. Gallbladder, biliary system, pancreas, spleen, and adrenal glands are within normal limits for age and demonstrate no acute finding. Incidental splenic calcification compatible granulomatous disease, image 29.  The stomach and proximal duodenum are distended with hyperdense heterogeneous fluid. Third portion of the duodenum demonstrates intraluminal dense contrast. This is compatible with contrast extravasation/active duodenal bleeding. This portion of the duodenum demonstrates circumferential wall thickening. Appearance is compatible with bleeding from a duodenal ulcer with duodenitis versus a duodenum mass.  Kidneys demonstrate hypodense renal cysts bilaterally. Largest cyst in the left kidney midpole measures 13 mm,  image 38. No  renal obstruction or hydronephrosis. No obstructing urinary tract calculi.  Atherosclerotic changes of the aorta diffusely. Negative for aneurysm or dissection.  No free air, fluid collection, or abscess. Diffuse colonic diverticulosis present.  Pelvis: Extensive sigmoid region diverticulosis. No pelvic free fluid, fluid collection, hemorrhage, abscess, or adenopathy. No inguinal abnormality or hernia. Urinary bladder unremarkable. Previous right hip arthroplasty noted. Diffuse degenerative changes of the spine and pelvis.  IMPRESSION: Active contrast extravasation/ bleeding within the distal duodenum in an area of duodenal wall thickening compatible with duodenal ulcer disease with duodenitis versus a duodenal mass. Associated gastric outlet obstruction with marked distention of the stomach and proximal duodenum.  Bibasilar atelectasis and trace left pleural fluid  Probable mild hepatic cirrhosis  Aortic atherosclerosis without occlusion  Incidental renal cysts  Colonic diverticulosis  These results were called by telephone at the time of interpretation on 09/23/2013 at 6:35 PM to Dr. Varney Biles , who verbally acknowledged these results.   Electronically Signed   By: Daryll Brod M.D.   On: 09/23/2013 18:49   Ir Angiogram Visceral Selective  09/27/2013   CLINICAL DATA:  Gastrointestinal bleeding. Duodenal ulcer was clipped.  EXAM: SELECTIVE VISCERAL ARTERIOGRAPHY; ADDITIONAL ARTERIOGRAPHY; IR ULTRASOUND GUIDANCE VASC ACCESS RIGHT  MEDICATIONS AND MEDICAL HISTORY: Versed 5 mg, Fentanyl 25 mcg.  ANESTHESIA/SEDATION: Moderate sedation time: 25 minutes  CONTRAST:  80 cc Omnipaque 300  FLUOROSCOPY TIME:  4 min and 48 seconds.  PROCEDURE: The procedure, risks, benefits, and alternatives were explained to the patient. Questions regarding the procedure were encouraged and answered. The patient understands and consents to the procedure.  The right groin was prepped with Betadine in a sterile fashion, and a sterile  drape was applied covering the operative field. A sterile gown and sterile gloves were used for the procedure.  Under sonographic guidance, a in 19 gauge needle was inserted into the common femoral artery and removed over a Bentson. A 5 French sheath was inserted. A cobra 2 catheter was advanced over the Bentson wire and into the celiac axis. Contrast was injected. It was advanced over a glidewire into the common hepatic artery and angiography was performed. The catheter was advanced into the gastroduodenal artery and repeat imaging was performed.  The catheter was retracted and advanced over a Bentson wire into the SMA. Angiography was performed in the AP and bilateral oblique views. The cobra catheter was removed. Femoral angiography was performed. Exo seal was deployed for hemostasis without complication  COMPLICATIONS: None  FINDINGS: Injection of the celiac axis demonstrates vascular anatomy and no source of gastrointestinal bleeding. Specifically, injection of the gastroduodenal artery demonstrates no active extravasation of contrast or pseudoaneurysm. Of note, the endoscopic clips within the distal duodenum are apparently beyond the vascular distribution of the gastroduodenal artery.  Injection of the SMA demonstrates no source of active gastrointestinal bleeding. Jejunal branches leading towards the duodenal clips or noted. Embolization was not performed.  IMPRESSION: Gastrointestinal angiography demonstrates no source of bleeding. Both the celiac and SMA were injected. The region of the bleeding noted on endoscopy was beyond the distribution of the gastroduodenal artery, therefore empiric embolization was not performed. The source vessel also not be identified from the SMA injection.   Electronically Signed   By: Maryclare Bean M.D.   On: 09/27/2013 13:21   Ir Angiogram Visceral Selective  09/27/2013   CLINICAL DATA:  Gastrointestinal bleeding. Duodenal ulcer was clipped.  EXAM: SELECTIVE VISCERAL  ARTERIOGRAPHY; ADDITIONAL ARTERIOGRAPHY; IR ULTRASOUND GUIDANCE VASC ACCESS  RIGHT  MEDICATIONS AND MEDICAL HISTORY: Versed 5 mg, Fentanyl 25 mcg.  ANESTHESIA/SEDATION: Moderate sedation time: 25 minutes  CONTRAST:  80 cc Omnipaque 300  FLUOROSCOPY TIME:  4 min and 48 seconds.  PROCEDURE: The procedure, risks, benefits, and alternatives were explained to the patient. Questions regarding the procedure were encouraged and answered. The patient understands and consents to the procedure.  The right groin was prepped with Betadine in a sterile fashion, and a sterile drape was applied covering the operative field. A sterile gown and sterile gloves were used for the procedure.  Under sonographic guidance, a in 19 gauge needle was inserted into the common femoral artery and removed over a Bentson. A 5 French sheath was inserted. A cobra 2 catheter was advanced over the Bentson wire and into the celiac axis. Contrast was injected. It was advanced over a glidewire into the common hepatic artery and angiography was performed. The catheter was advanced into the gastroduodenal artery and repeat imaging was performed.  The catheter was retracted and advanced over a Bentson wire into the SMA. Angiography was performed in the AP and bilateral oblique views. The cobra catheter was removed. Femoral angiography was performed. Exo seal was deployed for hemostasis without complication  COMPLICATIONS: None  FINDINGS: Injection of the celiac axis demonstrates vascular anatomy and no source of gastrointestinal bleeding. Specifically, injection of the gastroduodenal artery demonstrates no active extravasation of contrast or pseudoaneurysm. Of note, the endoscopic clips within the distal duodenum are apparently beyond the vascular distribution of the gastroduodenal artery.  Injection of the SMA demonstrates no source of active gastrointestinal bleeding. Jejunal branches leading towards the duodenal clips or noted. Embolization was not  performed.  IMPRESSION: Gastrointestinal angiography demonstrates no source of bleeding. Both the celiac and SMA were injected. The region of the bleeding noted on endoscopy was beyond the distribution of the gastroduodenal artery, therefore empiric embolization was not performed. The source vessel also not be identified from the SMA injection.   Electronically Signed   By: Maryclare Bean M.D.   On: 09/27/2013 13:21   Ir Angiogram Selective Each Additional Vessel  09/27/2013   CLINICAL DATA:  Gastrointestinal bleeding. Duodenal ulcer was clipped.  EXAM: SELECTIVE VISCERAL ARTERIOGRAPHY; ADDITIONAL ARTERIOGRAPHY; IR ULTRASOUND GUIDANCE VASC ACCESS RIGHT  MEDICATIONS AND MEDICAL HISTORY: Versed 5 mg, Fentanyl 25 mcg.  ANESTHESIA/SEDATION: Moderate sedation time: 25 minutes  CONTRAST:  80 cc Omnipaque 300  FLUOROSCOPY TIME:  4 min and 48 seconds.  PROCEDURE: The procedure, risks, benefits, and alternatives were explained to the patient. Questions regarding the procedure were encouraged and answered. The patient understands and consents to the procedure.  The right groin was prepped with Betadine in a sterile fashion, and a sterile drape was applied covering the operative field. A sterile gown and sterile gloves were used for the procedure.  Under sonographic guidance, a in 19 gauge needle was inserted into the common femoral artery and removed over a Bentson. A 5 French sheath was inserted. A cobra 2 catheter was advanced over the Bentson wire and into the celiac axis. Contrast was injected. It was advanced over a glidewire into the common hepatic artery and angiography was performed. The catheter was advanced into the gastroduodenal artery and repeat imaging was performed.  The catheter was retracted and advanced over a Bentson wire into the SMA. Angiography was performed in the AP and bilateral oblique views. The cobra catheter was removed. Femoral angiography was performed. Exo seal was deployed for hemostasis  without complication  COMPLICATIONS: None  FINDINGS: Injection of the celiac axis demonstrates vascular anatomy and no source of gastrointestinal bleeding. Specifically, injection of the gastroduodenal artery demonstrates no active extravasation of contrast or pseudoaneurysm. Of note, the endoscopic clips within the distal duodenum are apparently beyond the vascular distribution of the gastroduodenal artery.  Injection of the SMA demonstrates no source of active gastrointestinal bleeding. Jejunal branches leading towards the duodenal clips or noted. Embolization was not performed.  IMPRESSION: Gastrointestinal angiography demonstrates no source of bleeding. Both the celiac and SMA were injected. The region of the bleeding noted on endoscopy was beyond the distribution of the gastroduodenal artery, therefore empiric embolization was not performed. The source vessel also not be identified from the SMA injection.   Electronically Signed   By: Maryclare Bean M.D.   On: 09/27/2013 13:21   US Abdomen Limited  09/23/2013   CLINICAL DATA:  Vomiting  EXAM: US ABDOMEN LIMITED - RIGHT UPPER QUADRANT  COMPARISON:  None.  FINDINGS: Gallbladder  No gallstones or wall thickening visualized. There is no pericholecystic fluid. No sonographic Murphy sign noted.  Common bile duct  Diameter: 3 mm. There is no intrahepatic or extrahepatic biliary duct dilatation.  Liver:  No focal lesion identified. Liver echogenicity is somewhat increased and inhomogeneous.  IMPRESSION: Liver echogenicity is somewhat increased and inhomogeneous. This appearance is consistent with fatty change and/or underlying parenchymal disease. While no focal liver lesions are identified, it must be cautioned that the sensitivity of ultrasound for focal liver lesions is diminished in this circumstance. Study is otherwise unremarkable.   Electronically Signed   By: Lowella Grip M.D.   On: 09/23/2013 14:48   Ir US Guide Vasc Access Right  09/27/2013   CLINICAL  DATA:  Gastrointestinal bleeding. Duodenal ulcer was clipped.  EXAM: SELECTIVE VISCERAL ARTERIOGRAPHY; ADDITIONAL ARTERIOGRAPHY; IR ULTRASOUND GUIDANCE VASC ACCESS RIGHT  MEDICATIONS AND MEDICAL HISTORY: Versed 5 mg, Fentanyl 25 mcg.  ANESTHESIA/SEDATION: Moderate sedation time: 25 minutes  CONTRAST:  80 cc Omnipaque 300  FLUOROSCOPY TIME:  4 min and 48 seconds.  PROCEDURE: The procedure, risks, benefits, and alternatives were explained to the patient. Questions regarding the procedure were encouraged and answered. The patient understands and consents to the procedure.  The right groin was prepped with Betadine in a sterile fashion, and a sterile drape was applied covering the operative field. A sterile gown and sterile gloves were used for the procedure.  Under sonographic guidance, a in 19 gauge needle was inserted into the common femoral artery and removed over a Bentson. A 5 French sheath was inserted. A cobra 2 catheter was advanced over the Bentson wire and into the celiac axis. Contrast was injected. It was advanced over a glidewire into the common hepatic artery and angiography was performed. The catheter was advanced into the gastroduodenal artery and repeat imaging was performed.  The catheter was retracted and advanced over a Bentson wire into the SMA. Angiography was performed in the AP and bilateral oblique views. The cobra catheter was removed. Femoral angiography was performed. Exo seal was deployed for hemostasis without complication  COMPLICATIONS: None  FINDINGS: Injection of the celiac axis demonstrates vascular anatomy and no source of gastrointestinal bleeding. Specifically, injection of the gastroduodenal artery demonstrates no active extravasation of contrast or pseudoaneurysm. Of note, the endoscopic clips within the distal duodenum are apparently beyond the vascular distribution of the gastroduodenal artery.  Injection of the SMA demonstrates no source of active gastrointestinal bleeding.  Jejunal branches leading towards the duodenal clips  or noted. Embolization was not performed.  IMPRESSION: Gastrointestinal angiography demonstrates no source of bleeding. Both the celiac and SMA were injected. The region of the bleeding noted on endoscopy was beyond the distribution of the gastroduodenal artery, therefore empiric embolization was not performed. The source vessel also not be identified from the SMA injection.   Electronically Signed   By: Maryclare Bean M.D.   On: 09/27/2013 13:21   Dg Chest Port 1 View  09/25/2013   CLINICAL DATA:  Central line placement.  EXAM: PORTABLE CHEST - 1 VIEW  COMPARISON:  Chest radiograph performed 09/23/2013  FINDINGS: The patient's right IJ line is noted ending about the mid to distal SVC. The enteric tube is noted extending below the diaphragm.  The lungs are hypoexpanded. Mild bibasilar atelectasis is noted. No pleural effusion or pneumothorax is seen.  The cardiomediastinal silhouette is normal in size. No acute osseous abnormalities are identified.  IMPRESSION: 1. Right IJ line noted ending about the mid to distal SVC. 2. Lungs hypoexpanded, with mild bibasilar atelectasis.   Electronically Signed   By: Garald Balding M.D.   On: 09/25/2013 00:17   Dg Abd Portable 1v  09/26/2013   CLINICAL DATA:  Abdominal pain  EXAM: PORTABLE ABDOMEN - 1 VIEW  COMPARISON:  Abdominal films of September 25, 2013.  FINDINGS: The bowel gas pattern does not suggest obstruction or ileus. No abnormal extraluminal gas collections are demonstrated. Portions of the left aspect of the abdomen are excluded from the field-of-view. There is radiodense material in the region of the urinary bladder likely from the previous CT scan. There are surgical clips overlying the right and left L2 transverse processes.  IMPRESSION: There is no evidence of bowel obstruction or ileus. A three-way abdominal series may be useful if further plain film imaging is desired.   Electronically Signed   By: David   [place]   On: 09/26/2013 11:05   Dg Abd Portable 1v  09/25/2013   CLINICAL DATA:  Gastric bleeding. Concern for small bowel obstruction.  EXAM: PORTABLE ABDOMEN - 1 VIEW  COMPARISON:  CT of the abdomen and pelvis performed 09/23/2013  FINDINGS: The visualized bowel gas pattern is grossly unremarkable. There is no evidence of bowel obstruction. Known active bleeding at the duodenum is not characterized on radiograph. The patient's enteric tube is noted overlying the body of the stomach. No free intra-abdominal air is identified, though evaluation for free air is suboptimal on supine views.  No acute osseous abnormalities are seen. Minimal degenerative change is noted along the lumbar spine. The patient's right hip prosthesis is grossly unremarkable in appearance, though incompletely imaged. Contrast is seen filling the bladder.  IMPRESSION: Unremarkable bowel gas pattern; no free intra-abdominal air seen.   Electronically Signed   By: Garald Balding M.D.   On: 09/25/2013 02:33   Ct Angio Abd/pel W/ And/or W/o  09/25/2013   CLINICAL DATA:  Upper GI bleed; bright red blood from nasogastric tube.  EXAM: CT ANGIOGRAPHY ABDOMEN AND PELVIS WITH CONTRAST AND WITHOUT CONTRAST  TECHNIQUE: Multidetector CT imaging of the abdomen and pelvis was performed using the standard protocol during bolus administration of intravenous contrast. Multiplanar reconstructed images including MIPs were obtained and reviewed to evaluate the vascular anatomy.  CONTRAST:  132mL OMNIPAQUE IOHEXOL 350 MG/ML SOLN  COMPARISON:  CT of the abdomen and pelvis performed 09/23/2013  FINDINGS: Trace bilateral pleural effusions are seen, left greater than right, with bibasilar airspace opacification likely reflecting atelectasis.  The known focus of  acute duodenal hemorrhage is better characterized on the prior study, as arterial phase images remain too early to visualize active contrast extravasation at the duodenum. As before, there is apparent wall  thickening at the second segment of the duodenum; this may reflect an underlying mass or a bleeding duodenal ulcer and associated duodenitis. Mildly heterogeneous blood is again seen distending the duodenum, with the duodenum measuring up to 5.0 cm in diameter. This appearance is grossly unchanged from the prior study.  The patient's enteric tube is seen ending at the body of the stomach. The stomach is largely decompressed, aside from a small amount of high attenuation fluid in the antrum, likely reflecting blood tracking from the duodenum.  The nodular contour of the liver is compatible with cirrhotic change. Minimal residual contrast is noted at the Phrygian cap of the gallbladder. The spleen is diminutive, with a few calcified granulomata. There is mild diffuse soft tissue stranding about the pancreas, more prominent than on the prior study. This may reflect the duodenal process, though would correlate with lipase levels. Peripancreatic nodes remain stable in size. The adrenal glands are unremarkable in appearance.  Mild calcification is noted along the abdominal aorta and its branches. There is slight ectasia of the distal abdominal aorta, without evidence of aneurysmal dilatation. The celiac trunk, superior mesenteric artery, bilateral renal arteries and inferior mesenteric artery remain patent. The inferior vena cava is grossly unremarkable in appearance.  Nonspecific perinephric stranding is noted bilaterally. Scarring is noted near the upper pole of the left kidney. Small bilateral renal cysts are seen. The kidneys are otherwise unremarkable. There is no evidence of hydronephrosis. No renal or ureteral stones are seen.  No free fluid is identified. The small bowel is unremarkable in appearance. The stomach is within normal limits. No acute vascular abnormalities are seen.  The appendix is grossly unremarkable in appearance, without evidence for appendicitis. Scattered diverticulosis is noted along the  entirety of the colon, without evidence of diverticulitis.  The bladder is mildly distended and grossly unremarkable in appearance. Mildly increased attenuation within the bladder may reflect residual contrast from the prior study. The prostate remains normal in size, with scattered calcification. No inguinal lymphadenopathy is seen.  No acute osseous abnormalities are identified. The patient's right hip prosthesis is incompletely imaged but appears grossly unremarkable.  Review of the MIP images confirms the above findings.  IMPRESSION: 1. Known focus of acute duodenal hemorrhage is better characterized on the prior study, as arterial phase images remain too early to visualize active contrast extravasation at the duodenum. As before, there is apparent wall thickening of the second segment of the duodenum. This may reflect an underlying mass or bleeding duodenal ulcer, and associated duodenitis. Mildly heterogeneous blood again seen distending the duodenum and tracking back into the gastric antrum. The duodenum measures up to 5.0 cm in diameter, relatively stable from the prior study. 2. Mildly increased diffuse soft tissue stranding about the pancreas. This may reflect the duodenal process, though would correlate with lipase levels to exclude mild pancreatitis. 3. Trace bilateral pleural effusions, left greater than right, with bibasilar airspace opacification likely reflecting atelectasis. 4. Changes of hepatic cirrhosis noted. 5. Mild calcification along the abdominal aorta and its branches. 6. Small bilateral renal cysts; scarring near the upper pole of the left kidney. 7. Scattered diverticulosis along the entirety of the colon, without evidence of diverticulitis.   Electronically Signed   By: Garald Balding M.D.   On: 09/25/2013 03:59    CBC  Recent Labs Lab 10/02/13 0735 10/02/13 1255 10/02/13 1923 10/03/13 0206 10/03/13 0815  WBC 8.6 9.2 8.6 7.6 12.6*  HGB 9.0* 9.0* 9.0* 8.4* 9.1*  HCT 26.8*  25.9* 27.0* 25.2* 27.7*  PLT 484* 556* 538* 488* 575*  MCV 91.2 90.9 92.2 91.6 91.7  MCH 30.6 31.6 30.7 30.5 30.1  MCHC 33.6 34.7 33.3 33.3 32.9  RDW 16.8* 16.8* 16.5* 16.3* 16.0*    Chemistries   Recent Labs Lab 09/27/13 0215 09/29/13 0500 10/01/13 1000 10/02/13 0735  NA 140 139 140 140  K 3.5 3.9 4.0 3.7  CL 110 106 108 104  CO2 21 21 21 22   GLUCOSE 99 111* 179* 115*  BUN 32* 22 21 17   CREATININE 1.15 1.05 1.02 0.97  CALCIUM 7.6* 8.0* 8.5 8.5  MG 2.3  --   --   --   AST  --  28  --   --   ALT  --  12  --   --   ALKPHOS  --  40  --   --   BILITOT  --  0.6  --   --    ------------------------------------------------------------------------------------------------------------------ estimated creatinine clearance is 53.7 ml/min (by C-G formula based on Cr of 0.97). ------------------------------------------------------------------------------------------------------------------ No results found for this basename: HGBA1C,  in the last 72 hours ------------------------------------------------------------------------------------------------------------------ No results found for this basename: CHOL, HDL, LDLCALC, TRIG, CHOLHDL, LDLDIRECT,  in the last 72 hours ------------------------------------------------------------------------------------------------------------------ No results found for this basename: TSH, T4TOTAL, FREET3, T3FREE, THYROIDAB,  in the last 72 hours ------------------------------------------------------------------------------------------------------------------ No results found for this basename: VITAMINB12, FOLATE, FERRITIN, TIBC, IRON, RETICCTPCT,  in the last 72 hours  Coagulation profile  Recent Labs Lab 09/26/13 1345  INR 1.37    No results found for this basename: DDIMER,  in the last 72 hours  Cardiac Enzymes No results found for this basename: CK, CKMB, TROPONINI, MYOGLOBIN,  in the last 168  hours ------------------------------------------------------------------------------------------------------------------ No components found with this basename: POCBNP,

## 2013-10-04 LAB — BASIC METABOLIC PANEL
BUN: 17 mg/dL (ref 6–23)
CO2: 21 mEq/L (ref 19–32)
Calcium: 8.5 mg/dL (ref 8.4–10.5)
Chloride: 105 mEq/L (ref 96–112)
Creatinine, Ser: 0.98 mg/dL (ref 0.50–1.35)
GFR calc Af Amer: 83 mL/min — ABNORMAL LOW (ref >90)
GFR calc non Af Amer: 72 mL/min — ABNORMAL LOW (ref >90)
Glucose, Bld: 96 mg/dL (ref 70–99)
Potassium: 3.8 mEq/L (ref 3.7–5.3)
Sodium: 138 mEq/L (ref 137–147)

## 2013-10-04 LAB — CBC
HCT: 25.6 % — ABNORMAL LOW (ref 39.0–52.0)
HCT: 27.9 % — ABNORMAL LOW (ref 39.0–52.0)
Hemoglobin: 8.5 g/dL — ABNORMAL LOW (ref 13.0–17.0)
Hemoglobin: 9.2 g/dL — ABNORMAL LOW (ref 13.0–17.0)
MCH: 30.6 pg (ref 26.0–34.0)
MCH: 30.8 pg (ref 26.0–34.0)
MCHC: 33.2 g/dL (ref 30.0–36.0)
MCHC: 33 g/dL (ref 30.0–36.0)
MCV: 92.1 fL (ref 78.0–100.0)
MCV: 93.3 fL (ref 78.0–100.0)
Platelets: 556 10*3/uL — ABNORMAL HIGH (ref 150–400)
Platelets: 660 10*3/uL — ABNORMAL HIGH (ref 150–400)
RBC: 2.78 MIL/uL — ABNORMAL LOW (ref 4.22–5.81)
RBC: 2.99 MIL/uL — ABNORMAL LOW (ref 4.22–5.81)
RDW: 15.9 % — ABNORMAL HIGH (ref 11.5–15.5)
RDW: 16.1 % — ABNORMAL HIGH (ref 11.5–15.5)
WBC: 7.6 10*3/uL (ref 4.0–10.5)
WBC: 7.4 10*3/uL (ref 4.0–10.5)

## 2013-10-04 LAB — GLUCOSE, CAPILLARY
Glucose-Capillary: 101 mg/dL — ABNORMAL HIGH (ref 70–99)
Glucose-Capillary: 111 mg/dL — ABNORMAL HIGH (ref 70–99)
Glucose-Capillary: 117 mg/dL — ABNORMAL HIGH (ref 70–99)
Glucose-Capillary: 109 mg/dL — ABNORMAL HIGH (ref 70–99)

## 2013-10-04 MED ORDER — BIOTENE DRY MOUTH MT LIQD
15.0000 mL | Freq: Two times a day (BID) | OROMUCOSAL | Status: DC
  Administered 2013-10-04 (×2): 15 mL via OROMUCOSAL

## 2013-10-04 MED ORDER — CHLORHEXIDINE GLUCONATE 0.12 % MT SOLN
15.0000 mL | Freq: Two times a day (BID) | OROMUCOSAL | Status: DC
  Administered 2013-10-04 – 2013-10-05 (×3): 15 mL via OROMUCOSAL
  Filled 2013-10-04 (×3): qty 15

## 2013-10-04 MED ORDER — METOCLOPRAMIDE HCL 5 MG/ML IJ SOLN: 10.0000 mg | Freq: Four times a day (QID) | INTRAMUSCULAR | Status: DC | PRN

## 2013-10-04 NOTE — Progress Notes (Signed)
PATIENT DETAILS Name: [name] Age: 80 y.o. Sex: male Date of Birth: 10-Mar-1924 Admit Date: 09/23/2013 Admitting Physician Rigoberto Noel, MD DJM:[id number] ALAN, MD  Subjective: Anxious to go home, brown stools today  Assessment/Plan: Active Problems: Upper GI bleed - Secondary to bleeding duodenal ulcer - Seems to have resolved - Continue with PPI    Acute blood loss anemia - Hemoglobin stable - Monitor periodically  Cough -? Suspect Secondary to severe esophagitis - No evidence of aspiration by speech therapy evaluation her chest x-ray negative for pneumonia on 1/3. The patient and daughter at bedside, much better today - Supportive care  New onset Atrial fibrillation with RVR  -currently rate controlled  -Likely secondary to acute blood loss anemia  -Given patient's current risk of bleeding, patient is not an anticoagulation candidate  -ECHO Sept 2014 with normal EF and grade 1 diastolic dysfunction with normal atria   AKI (acute kidney injury)  -Resolved   Diabetes mellitus, type 2  -current CBGs controlled, will continue insulin sliding scale with CBG monitoring-was not on meds at home    hypertension - Currently controlled without the use of any antihypertensive medications  Dyslipidemia - Resume statins on discharge  Disposition: Remain inpatient  DVT Prophylaxis: SCD's  Code Status: Full code   Family Communication Daughter at bedside  Procedures: EGD 09/23/2013, 09/25/2013  CONSULTS:  pulmonary/intensive care, GI and general surgery  MEDICATIONS: Scheduled Meds: . antiseptic oral rinse  15 mL Mouth Rinse q12n4p  . chlorhexidine  15 mL Mouth Rinse BID  . insulin aspart  0-15 Units Subcutaneous TID WC  . metoCLOPramide (REGLAN) injection  10 mg Intravenous Q6H  . pantoprazole  40 mg Oral BID AC   Continuous Infusions: . sodium chloride 20 mL/hr at 10/02/13 0535   PRN Meds:.acetaminophen, benzonatate, bisacodyl,  diphenhydrAMINE, HYDROcodone-homatropine, menthol-cetylpyridinium, ondansetron  Antibiotics: Anti-infectives   Start     Dose/Rate Route Frequency Ordered Stop   09/24/13 1200  metroNIDAZOLE (FLAGYL) IVPB 500 mg  Status:  Discontinued     500 mg 100 mL/hr over 60 Minutes Intravenous Every 8 hours 09/24/13 0753 09/27/13 0917   09/24/13 1000  ciprofloxacin (CIPRO) IVPB 400 mg  Status:  Discontinued     400 mg 200 mL/hr over 60 Minutes Intravenous Every 12 hours 09/24/13 0018 09/24/13 0744   09/24/13 1000  ciprofloxacin (CIPRO) IVPB 400 mg  Status:  Discontinued     400 mg 200 mL/hr over 60 Minutes Intravenous Every 12 hours 09/24/13 0748 09/27/13 0917   09/24/13 0800  metroNIDAZOLE (FLAGYL) IVPB 500 mg  Status:  Discontinued     500 mg 100 mL/hr over 60 Minutes Intravenous Every 8 hours 09/24/13 0747 09/24/13 0753   09/24/13 0800  metroNIDAZOLE (FLAGYL) IVPB 500 mg  Status:  Discontinued     500 mg 100 mL/hr over 60 Minutes Intravenous Every 8 hours 09/24/13 0748 09/24/13 0748   09/24/13 0600  metroNIDAZOLE (FLAGYL) IVPB 500 mg  Status:  Discontinued     500 mg 100 mL/hr over 60 Minutes Intravenous Every 8 hours 09/24/13 0014 09/24/13 0744   09/23/13 2100  ciprofloxacin (CIPRO) IVPB 400 mg     400 mg 200 mL/hr over 60 Minutes Intravenous  Once 09/23/13 2049 09/23/13 2337   09/23/13 2100  metroNIDAZOLE (FLAGYL) IVPB 500 mg     500 mg 100 mL/hr over 60 Minutes Intravenous  Once 09/23/13 2049 09/23/13 2336       PHYSICAL EXAM: Vital signs in last 24 hours:  Filed Vitals:   10/03/13 1506 10/03/13 2235 10/04/13 0632 10/04/13 1430  BP: 123/69 120/44 124/83 120/62  Pulse: 91 91 101 89  Temp: 97.9 F (36.6 C) 97.9 F (36.6 C) 97.5 F (36.4 C) 98.6 F (37 C)  TempSrc: Oral Oral  Axillary  Resp: 20 20 19 18   Height:      Weight:   77.1 kg (169 lb 15.6 oz)   SpO2: 97% 94% 96% 100%    Weight change:  Filed Weights   10/02/13 0631 10/03/13 0710 10/04/13 0632  Weight: 77.5 kg  (170 lb 13.7 oz) 77.9 kg (171 lb 11.8 oz) 77.1 kg (169 lb 15.6 oz)   Body mass index is 25.09 kg/(m^2).   Gen Exam: Awake and alert with clear speech.   Neck: Supple, No JVD.   Chest: B/L Clear.   CVS: S1 S2 Regular, no murmurs.  Abdomen: soft, BS +, non tender, non distended.  Extremities: no edema, lower extremities warm to touch. Neurologic: Non Focal.   Skin: No Rash.   Wounds: N/A.   Intake/Output from previous day:  Intake/Output Summary (Last 24 hours) at 10/04/13 1658 Last data filed at 10/04/13 1654  Gross per 24 hour  Intake    240 ml  Output   1115 ml  Net   -875 ml     LAB RESULTS: CBC  Recent Labs Lab 10/03/13 0206 10/03/13 0815 10/03/13 1214 10/04/13 0105 10/04/13 1241  WBC 7.6 12.6* 11.6* 7.6 7.4  HGB 8.4* 9.1* 9.2* 8.5* 9.2*  HCT 25.2* 27.7* 28.2* 25.6* 27.9*  PLT 488* 575* 595* 556* 660*  MCV 91.6 91.7 93.1 92.1 93.3  MCH 30.5 30.1 30.4 30.6 30.8  MCHC 33.3 32.9 32.6 33.2 33.0  RDW 16.3* 16.0* 16.2* 15.9* 16.1*    Chemistries   Recent Labs Lab 09/29/13 0500 10/01/13 1000 10/02/13 0735 10/04/13 0105  NA 139 140 140 138  K 3.9 4.0 3.7 3.8  CL 106 108 104 105  CO2 21 21 22 21   GLUCOSE 111* 179* 115* 96  BUN 22 21 17 17   CREATININE 1.05 1.02 0.97 0.98  CALCIUM 8.0* 8.5 8.5 8.5    CBG:  Recent Labs Lab 10/03/13 1654 10/03/13 2234 10/04/13 0737 10/04/13 1151 10/04/13 1652  GLUCAP 127* 90 101* 111* 117*    GFR Estimated Creatinine Clearance: 53.1 ml/min (by C-G formula based on Cr of 0.98).  Coagulation profile No results found for this basename: INR, PROTIME,  in the last 168 hours  Cardiac Enzymes No results found for this basename: CK, CKMB, TROPONINI, MYOGLOBIN,  in the last 168 hours  No components found with this basename: POCBNP,  No results found for this basename: DDIMER,  in the last 72 hours No results found for this basename: HGBA1C,  in the last 72 hours No results found for this basename: CHOL, HDL,  LDLCALC, TRIG, CHOLHDL, LDLDIRECT,  in the last 72 hours No results found for this basename: TSH, T4TOTAL, FREET3, T3FREE, THYROIDAB,  in the last 72 hours No results found for this basename: VITAMINB12, FOLATE, FERRITIN, TIBC, IRON, RETICCTPCT,  in the last 72 hours No results found for this basename: LIPASE, AMYLASE,  in the last 72 hours  Urine Studies No results found for this basename: UACOL, UAPR, USPG, UPH, UTP, UGL, UKET, UBIL, UHGB, UNIT, UROB, ULEU, UEPI, UWBC, URBC, UBAC, CAST, CRYS, UCOM, BILUA,  in the last 72 hours  MICROBIOLOGY: No results found for this or any previous visit (from the past 240 hour(s)).  RADIOLOGY STUDIES/RESULTS: Dg Chest 1 View  10/03/2013   CLINICAL DATA:  Persistent cough with leukocytosis  EXAM: CHEST - 1 VIEW  COMPARISON:  09/29/2013  FINDINGS: Improved lower lung aeration, especially in the retrocardiac lung where airspace opacity now appears more linear and atelectatic. Small pleural effusions may persist. Normal heart size. No acute osseous findings. Remote right AC joint injury.  IMPRESSION: Improving basilar lung aeration.  No evidence of new lung infection.   Electronically Signed   By: Jorje Guild M.D.   On: 10/03/2013 21:08   Dg Chest 1 View  09/23/2013   CLINICAL DATA:  Chest pain  EXAM: CHEST - 1 VIEW  COMPARISON:  04/22/2013  FINDINGS: The heart size and mediastinal contours are within normal limits. Both lungs are clear. The visualized skeletal structures are unremarkable.  IMPRESSION: No active disease.   Electronically Signed   By: Inez Catalina M.D.   On: 09/23/2013 12:22   Dg Chest 2 View  09/29/2013   CLINICAL DATA:  Cough  EXAM: CHEST  2 VIEW  COMPARISON:  09/25/2013  FINDINGS: The cardiac shadow is stable. A right-sided central venous line is again seen and stable. The lungs are well aerated. Mild patchy changes are noted bilaterally in the bases although stable from the prior study. The nasogastric catheter is been removed in the  interval. No new focal abnormality is seen.  IMPRESSION: Stable bibasilar changes.  No new focal abnormality is noted.   Electronically Signed   By: Inez Catalina M.D.   On: 09/29/2013 11:19   Ct Abdomen Pelvis W Contrast  09/23/2013   CLINICAL DATA:  Vomiting, epigastric pain  EXAM: CT ABDOMEN AND PELVIS WITH CONTRAST  TECHNIQUE: Multidetector CT imaging of the abdomen and pelvis was performed using the standard protocol following bolus administration of intravenous contrast.  CONTRAST:  111mL OMNIPAQUE IOHEXOL 300 MG/ML  SOLN  COMPARISON:  09/23/2013 abdominal ultrasound  FINDINGS: Minor dependent basilar atelectasis bilaterally. Normal heart size. No pericardial effusion. Trace left pleural effusion. Distal esophagus is patulous and fluid-filled, suspect esophageal dysmotility. Coronary calcifications noted. Degenerative changes of the lower thoracic spine with large osteophytes.  Abdomen: Mild nodularity to the liver surface compatible with cirrhosis. No biliary dilatation. Gallbladder, biliary system, pancreas, spleen, and adrenal glands are within normal limits for age and demonstrate no acute finding. Incidental splenic calcification compatible granulomatous disease, image 29.  The stomach and proximal duodenum are distended with hyperdense heterogeneous fluid. Third portion of the duodenum demonstrates intraluminal dense contrast. This is compatible with contrast extravasation/active duodenal bleeding. This portion of the duodenum demonstrates circumferential wall thickening. Appearance is compatible with bleeding from a duodenal ulcer with duodenitis versus a duodenum mass.  Kidneys demonstrate hypodense renal cysts bilaterally. Largest cyst in the left kidney midpole measures 13 mm, image 38. No renal obstruction or hydronephrosis. No obstructing urinary tract calculi.  Atherosclerotic changes of the aorta diffusely. Negative for aneurysm or dissection.  No free air, fluid collection, or abscess.  Diffuse colonic diverticulosis present.  Pelvis: Extensive sigmoid region diverticulosis. No pelvic free fluid, fluid collection, hemorrhage, abscess, or adenopathy. No inguinal abnormality or hernia. Urinary bladder unremarkable. Previous right hip arthroplasty noted. Diffuse degenerative changes of the spine and pelvis.  IMPRESSION: Active contrast extravasation/ bleeding within the distal duodenum in an area of duodenal wall thickening compatible with duodenal ulcer disease with duodenitis versus a duodenal mass. Associated gastric outlet obstruction with marked distention of the stomach and proximal duodenum.  Bibasilar atelectasis and trace left  pleural fluid  Probable mild hepatic cirrhosis  Aortic atherosclerosis without occlusion  Incidental renal cysts  Colonic diverticulosis  These results were called by telephone at the time of interpretation on 09/23/2013 at 6:35 PM to Dr. Varney Biles , who verbally acknowledged these results.   Electronically Signed   By: Daryll Brod M.D.   On: 09/23/2013 18:49   Ir Angiogram Visceral Selective  09/27/2013   CLINICAL DATA:  Gastrointestinal bleeding. Duodenal ulcer was clipped.  EXAM: SELECTIVE VISCERAL ARTERIOGRAPHY; ADDITIONAL ARTERIOGRAPHY; IR ULTRASOUND GUIDANCE VASC ACCESS RIGHT  MEDICATIONS AND MEDICAL HISTORY: Versed 5 mg, Fentanyl 25 mcg.  ANESTHESIA/SEDATION: Moderate sedation time: 25 minutes  CONTRAST:  80 cc Omnipaque 300  FLUOROSCOPY TIME:  4 min and 48 seconds.  PROCEDURE: The procedure, risks, benefits, and alternatives were explained to the patient. Questions regarding the procedure were encouraged and answered. The patient understands and consents to the procedure.  The right groin was prepped with Betadine in a sterile fashion, and a sterile drape was applied covering the operative field. A sterile gown and sterile gloves were used for the procedure.  Under sonographic guidance, a in 19 gauge needle was inserted into the common femoral artery  and removed over a Bentson. A 5 French sheath was inserted. A cobra 2 catheter was advanced over the Bentson wire and into the celiac axis. Contrast was injected. It was advanced over a glidewire into the common hepatic artery and angiography was performed. The catheter was advanced into the gastroduodenal artery and repeat imaging was performed.  The catheter was retracted and advanced over a Bentson wire into the SMA. Angiography was performed in the AP and bilateral oblique views. The cobra catheter was removed. Femoral angiography was performed. Exo seal was deployed for hemostasis without complication  COMPLICATIONS: None  FINDINGS: Injection of the celiac axis demonstrates vascular anatomy and no source of gastrointestinal bleeding. Specifically, injection of the gastroduodenal artery demonstrates no active extravasation of contrast or pseudoaneurysm. Of note, the endoscopic clips within the distal duodenum are apparently beyond the vascular distribution of the gastroduodenal artery.  Injection of the SMA demonstrates no source of active gastrointestinal bleeding. Jejunal branches leading towards the duodenal clips or noted. Embolization was not performed.  IMPRESSION: Gastrointestinal angiography demonstrates no source of bleeding. Both the celiac and SMA were injected. The region of the bleeding noted on endoscopy was beyond the distribution of the gastroduodenal artery, therefore empiric embolization was not performed. The source vessel also not be identified from the SMA injection.   Electronically Signed   By: Maryclare Bean M.D.   On: 09/27/2013 13:21   Ir Angiogram Visceral Selective  09/27/2013   CLINICAL DATA:  Gastrointestinal bleeding. Duodenal ulcer was clipped.  EXAM: SELECTIVE VISCERAL ARTERIOGRAPHY; ADDITIONAL ARTERIOGRAPHY; IR ULTRASOUND GUIDANCE VASC ACCESS RIGHT  MEDICATIONS AND MEDICAL HISTORY: Versed 5 mg, Fentanyl 25 mcg.  ANESTHESIA/SEDATION: Moderate sedation time: 25 minutes  CONTRAST:   80 cc Omnipaque 300  FLUOROSCOPY TIME:  4 min and 48 seconds.  PROCEDURE: The procedure, risks, benefits, and alternatives were explained to the patient. Questions regarding the procedure were encouraged and answered. The patient understands and consents to the procedure.  The right groin was prepped with Betadine in a sterile fashion, and a sterile drape was applied covering the operative field. A sterile gown and sterile gloves were used for the procedure.  Under sonographic guidance, a in 19 gauge needle was inserted into the common femoral artery and removed over a Bentson. A 5 [place]  sheath was inserted. A cobra 2 catheter was advanced over the Bentson wire and into the celiac axis. Contrast was injected. It was advanced over a glidewire into the common hepatic artery and angiography was performed. The catheter was advanced into the gastroduodenal artery and repeat imaging was performed.  The catheter was retracted and advanced over a Bentson wire into the SMA. Angiography was performed in the AP and bilateral oblique views. The cobra catheter was removed. Femoral angiography was performed. Exo seal was deployed for hemostasis without complication  COMPLICATIONS: None  FINDINGS: Injection of the celiac axis demonstrates vascular anatomy and no source of gastrointestinal bleeding. Specifically, injection of the gastroduodenal artery demonstrates no active extravasation of contrast or pseudoaneurysm. Of note, the endoscopic clips within the distal duodenum are apparently beyond the vascular distribution of the gastroduodenal artery.  Injection of the SMA demonstrates no source of active gastrointestinal bleeding. Jejunal branches leading towards the duodenal clips or noted. Embolization was not performed.  IMPRESSION: Gastrointestinal angiography demonstrates no source of bleeding. Both the celiac and SMA were injected. The region of the bleeding noted on endoscopy was beyond the distribution of the  gastroduodenal artery, therefore empiric embolization was not performed. The source vessel also not be identified from the SMA injection.   Electronically Signed   By: Maryclare Bean M.D.   On: 09/27/2013 13:21   Ir Angiogram Selective Each Additional Vessel  09/27/2013   CLINICAL DATA:  Gastrointestinal bleeding. Duodenal ulcer was clipped.  EXAM: SELECTIVE VISCERAL ARTERIOGRAPHY; ADDITIONAL ARTERIOGRAPHY; IR ULTRASOUND GUIDANCE VASC ACCESS RIGHT  MEDICATIONS AND MEDICAL HISTORY: Versed 5 mg, Fentanyl 25 mcg.  ANESTHESIA/SEDATION: Moderate sedation time: 25 minutes  CONTRAST:  80 cc Omnipaque 300  FLUOROSCOPY TIME:  4 min and 48 seconds.  PROCEDURE: The procedure, risks, benefits, and alternatives were explained to the patient. Questions regarding the procedure were encouraged and answered. The patient understands and consents to the procedure.  The right groin was prepped with Betadine in a sterile fashion, and a sterile drape was applied covering the operative field. A sterile gown and sterile gloves were used for the procedure.  Under sonographic guidance, a in 19 gauge needle was inserted into the common femoral artery and removed over a Bentson. A 5 French sheath was inserted. A cobra 2 catheter was advanced over the Bentson wire and into the celiac axis. Contrast was injected. It was advanced over a glidewire into the common hepatic artery and angiography was performed. The catheter was advanced into the gastroduodenal artery and repeat imaging was performed.  The catheter was retracted and advanced over a Bentson wire into the SMA. Angiography was performed in the AP and bilateral oblique views. The cobra catheter was removed. Femoral angiography was performed. Exo seal was deployed for hemostasis without complication  COMPLICATIONS: None  FINDINGS: Injection of the celiac axis demonstrates vascular anatomy and no source of gastrointestinal bleeding. Specifically, injection of the gastroduodenal artery  demonstrates no active extravasation of contrast or pseudoaneurysm. Of note, the endoscopic clips within the distal duodenum are apparently beyond the vascular distribution of the gastroduodenal artery.  Injection of the SMA demonstrates no source of active gastrointestinal bleeding. Jejunal branches leading towards the duodenal clips or noted. Embolization was not performed.  IMPRESSION: Gastrointestinal angiography demonstrates no source of bleeding. Both the celiac and SMA were injected. The region of the bleeding noted on endoscopy was beyond the distribution of the gastroduodenal artery, therefore empiric embolization was not performed. The source vessel also not be identified  from the SMA injection.   Electronically Signed   By: Maryclare Bean M.D.   On: 09/27/2013 13:21   US Abdomen Limited  09/23/2013   CLINICAL DATA:  Vomiting  EXAM: US ABDOMEN LIMITED - RIGHT UPPER QUADRANT  COMPARISON:  None.  FINDINGS: Gallbladder  No gallstones or wall thickening visualized. There is no pericholecystic fluid. No sonographic Murphy sign noted.  Common bile duct  Diameter: 3 mm. There is no intrahepatic or extrahepatic biliary duct dilatation.  Liver:  No focal lesion identified. Liver echogenicity is somewhat increased and inhomogeneous.  IMPRESSION: Liver echogenicity is somewhat increased and inhomogeneous. This appearance is consistent with fatty change and/or underlying parenchymal disease. While no focal liver lesions are identified, it must be cautioned that the sensitivity of ultrasound for focal liver lesions is diminished in this circumstance. Study is otherwise unremarkable.   Electronically Signed   By: Lowella Grip M.D.   On: 09/23/2013 14:48   Ir US Guide Vasc Access Right  09/27/2013   CLINICAL DATA:  Gastrointestinal bleeding. Duodenal ulcer was clipped.  EXAM: SELECTIVE VISCERAL ARTERIOGRAPHY; ADDITIONAL ARTERIOGRAPHY; IR ULTRASOUND GUIDANCE VASC ACCESS RIGHT  MEDICATIONS AND MEDICAL HISTORY:  Versed 5 mg, Fentanyl 25 mcg.  ANESTHESIA/SEDATION: Moderate sedation time: 25 minutes  CONTRAST:  80 cc Omnipaque 300  FLUOROSCOPY TIME:  4 min and 48 seconds.  PROCEDURE: The procedure, risks, benefits, and alternatives were explained to the patient. Questions regarding the procedure were encouraged and answered. The patient understands and consents to the procedure.  The right groin was prepped with Betadine in a sterile fashion, and a sterile drape was applied covering the operative field. A sterile gown and sterile gloves were used for the procedure.  Under sonographic guidance, a in 19 gauge needle was inserted into the common femoral artery and removed over a Bentson. A 5 French sheath was inserted. A cobra 2 catheter was advanced over the Bentson wire and into the celiac axis. Contrast was injected. It was advanced over a glidewire into the common hepatic artery and angiography was performed. The catheter was advanced into the gastroduodenal artery and repeat imaging was performed.  The catheter was retracted and advanced over a Bentson wire into the SMA. Angiography was performed in the AP and bilateral oblique views. The cobra catheter was removed. Femoral angiography was performed. Exo seal was deployed for hemostasis without complication  COMPLICATIONS: None  FINDINGS: Injection of the celiac axis demonstrates vascular anatomy and no source of gastrointestinal bleeding. Specifically, injection of the gastroduodenal artery demonstrates no active extravasation of contrast or pseudoaneurysm. Of note, the endoscopic clips within the distal duodenum are apparently beyond the vascular distribution of the gastroduodenal artery.  Injection of the SMA demonstrates no source of active gastrointestinal bleeding. Jejunal branches leading towards the duodenal clips or noted. Embolization was not performed.  IMPRESSION: Gastrointestinal angiography demonstrates no source of bleeding. Both the celiac and SMA were  injected. The region of the bleeding noted on endoscopy was beyond the distribution of the gastroduodenal artery, therefore empiric embolization was not performed. The source vessel also not be identified from the SMA injection.   Electronically Signed   By: Maryclare Bean M.D.   On: 09/27/2013 13:21   Dg Chest Port 1 View  09/25/2013   CLINICAL DATA:  Central line placement.  EXAM: PORTABLE CHEST - 1 VIEW  COMPARISON:  Chest radiograph performed 09/23/2013  FINDINGS: The patient's right IJ line is noted ending about the mid to distal SVC. The enteric tube  is noted extending below the diaphragm.  The lungs are hypoexpanded. Mild bibasilar atelectasis is noted. No pleural effusion or pneumothorax is seen.  The cardiomediastinal silhouette is normal in size. No acute osseous abnormalities are identified.  IMPRESSION: 1. Right IJ line noted ending about the mid to distal SVC. 2. Lungs hypoexpanded, with mild bibasilar atelectasis.   Electronically Signed   By: Garald Balding M.D.   On: 09/25/2013 00:17   Dg Abd Portable 1v  09/26/2013   CLINICAL DATA:  Abdominal pain  EXAM: PORTABLE ABDOMEN - 1 VIEW  COMPARISON:  Abdominal films of September 25, 2013.  FINDINGS: The bowel gas pattern does not suggest obstruction or ileus. No abnormal extraluminal gas collections are demonstrated. Portions of the left aspect of the abdomen are excluded from the field-of-view. There is radiodense material in the region of the urinary bladder likely from the previous CT scan. There are surgical clips overlying the right and left L2 transverse processes.  IMPRESSION: There is no evidence of bowel obstruction or ileus. A three-way abdominal series may be useful if further plain film imaging is desired.   Electronically Signed   By: David  [place]   On: 09/26/2013 11:05   Dg Abd Portable 1v  09/25/2013   CLINICAL DATA:  Gastric bleeding. Concern for small bowel obstruction.  EXAM: PORTABLE ABDOMEN - 1 VIEW  COMPARISON:  CT of the  abdomen and pelvis performed 09/23/2013  FINDINGS: The visualized bowel gas pattern is grossly unremarkable. There is no evidence of bowel obstruction. Known active bleeding at the duodenum is not characterized on radiograph. The patient's enteric tube is noted overlying the body of the stomach. No free intra-abdominal air is identified, though evaluation for free air is suboptimal on supine views.  No acute osseous abnormalities are seen. Minimal degenerative change is noted along the lumbar spine. The patient's right hip prosthesis is grossly unremarkable in appearance, though incompletely imaged. Contrast is seen filling the bladder.  IMPRESSION: Unremarkable bowel gas pattern; no free intra-abdominal air seen.   Electronically Signed   By: Garald Balding M.D.   On: 09/25/2013 02:33   Dg Swallowing Func-speech Pathology  10/01/2013   Earle Gell McCoy, CCC-SLP     10/01/2013 10:00 AM Objective Swallowing Evaluation: Modified Barium Swallowing Study   Patient Details  Name: [name] MRN: [id number] Date of Birth: 10-11-1923  Today's Date: 10/01/2013 Time: 0930-0950 SLP Time Calculation (min): 20 min  Past Medical History:  Past Medical History  Diagnosis Date  . Hyperlipemia   . CVA (cerebral infarction)   . Hypertension   . Stroke     2011 no deficits   . GERD (gastroesophageal reflux disease)     hx of   . Arthritis   . Ulcer     stomach 2011 related to plavix   . Hx of transfusion of packed red blood cells   . Diabetes mellitus without complication     Diet controlled   Past Surgical History:  Past Surgical History  Procedure Laterality Date  . Total hip arthroplasty Right 04/28/2013    Procedure: RIGHT TOTAL HIP ARTHROPLASTY ANTERIOR APPROACH;   Surgeon: Mauri Pole, MD;  Location: [facility];  Service:  Orthopedics;  Laterality: Right;  . Esophagogastroduodenoscopy N/A 09/23/2013    Procedure: ESOPHAGOGASTRODUODENOSCOPY (EGD);  Surgeon: Lafayette Dragon, MD;  Location: [facility];  Service: Endoscopy;    Laterality: N/A;  . Esophagogastroduodenoscopy N/A 09/25/2013    Procedure: ESOPHAGOGASTRODUODENOSCOPY (EGD);  Surgeon: Saralyn Pilar  Renee Ramus, MD;  Location: [place];  Service: Endoscopy;   Laterality: N/A;   HPI:  80 year old male with PMH of CVA and GERD, initially admitted by  the critical care service after presenting with signs consistent  with acute GI bleeding and was also hypotensive and required  pressors. CT of the abdomen done at time of admission revealed a  gastric outlet obstruction with marked distention of the stomach  and proximal duodenum with contrast extravasation into the distal  duodenum. There was also associated peripancreatic stranding in  the area near the duodenum. Patient underwent an initial EGD on  09/23/2013 that revealed esophagitis and multiple shallow  duodenal ulcerations. A second EGD was completed on 09/25/2013  which revealed a massive duodenal ulcer with a visible vessel  which was clipped. The patient subsequently was evaluated by IR  for angiography and possible embolization in setting of ongoing  active bleeding. There was also concern that the patient may have  been experiencing mesenteric ischemia but an angiogram revealed  patent SMA and celiac branches with no source of GI bleeding  defined. Patient eventually stabilized from a hemodynamic  standpoint. Hemoglobin has remained stable.      Assessment / Plan / Recommendation Clinical Impression  Dysphagia Diagnosis: Mild pharyngeal phase dysphagia;Moderate  cervical esophageal phase dysphagia Clinical impression: Patient presents with improving overall  function compared to FEES complete 12/31. Patient presents with a  mild pharyngeal  and a moderate cervical esophageal dysphagia. A  delayed swallow initiation results in flash penetration of thin  liquids only. No additional episodes of penetration or aspiration  noted. A tight appearing UES (? due to combination of possible  osteophytes (MD not present) and lower  esophageal deficits given  acute diagnosis) result in mild pharyngeal residuals post swallow  (vallecular > pyriform sinus) which are cleared with moderate SLP  cueing for second swallow per bite/sip. Occassional strong cough  response noted despite protection of airway. Prognosis for  continued improvement good with continued improvement in  esophageal/GI function. Aspiration continues to be mild-moderate.  Education complete with patient regarding results and  recommendations. SLP will continue to f/u.     Treatment Recommendation  Therapy as outlined in treatment plan below    Diet Recommendation Thin liquid;Dysphagia 3 (Mechanical Soft)  (full liquids)   Liquid Administration via: Cup;No straw Medication Administration: Whole meds with puree Supervision: Patient able to self feed;Full supervision/cueing  for compensatory strategies Compensations: Slow rate;Small sips/bites;Multiple dry swallows  after each bite/sip Postural Changes and/or Swallow Maneuvers: Seated upright 90  degrees;Upright 30-60 min after meal    Other  Recommendations Oral Care Recommendations: Oral care BID   Follow Up Recommendations  None    Frequency and Duration min 2x/week  2 weeks           General HPI: 80 year old male with PMH of CVA and GERD, initially  admitted by the critical care service after presenting with signs  consistent with acute GI bleeding and was also hypotensive and  required pressors. CT of the abdomen done at time of admission  revealed a gastric outlet obstruction with marked distention of  the stomach and proximal duodenum with contrast extravasation  into the distal duodenum. There was also associated  peripancreatic stranding in the area near the duodenum. Patient  underwent an initial EGD on 09/23/2013 that revealed esophagitis  and multiple shallow duodenal ulcerations. A second EGD was  completed on 09/25/2013 which revealed a massive duodenal ulcer  with a visible  vessel which was clipped. The patient  subsequently  was evaluated by IR for angiography and possible embolization in  setting of ongoing active bleeding. There was also concern that  the patient may have been experiencing mesenteric ischemia but an  angiogram revealed patent SMA and celiac branches with no source  of GI bleeding defined. Patient eventually stabilized from a  hemodynamic standpoint. Hemoglobin has remained stable.  Type of Study: Modified Barium Swallowing Study Reason for Referral: Objectively evaluate swallowing function Previous Swallow Assessment: none Diet Prior to this Study: Thin liquids (full liquids) Temperature Spikes Noted: No Respiratory Status: Room air History of Recent Intubation: No Behavior/Cognition: Alert;Cooperative;Pleasant mood Oral Cavity - Dentition: Adequate natural dentition Oral Motor / Sensory Function: Within functional limits Self-Feeding Abilities: Able to feed self Patient Positioning: Upright in chair Baseline Vocal Quality: Clear Volitional Cough: Strong Volitional Swallow: Able to elicit Anatomy: Within functional limits Pharyngeal Secretions: Standing secretions in (comment) (pyriform  sinuses, eliciting cough response)    Reason for Referral Objectively evaluate swallowing function   Oral Phase Oral Preparation/Oral Phase Oral Phase: WFL   Pharyngeal Phase Pharyngeal Phase Pharyngeal Phase: Impaired Pharyngeal - Nectar Pharyngeal - Nectar Teaspoon: Not tested Pharyngeal - Nectar Cup: Not tested Pharyngeal - Thin Pharyngeal - Thin Cup: Pharyngeal residue - valleculae;Pharyngeal  residue - pyriform sinuses;Pharyngeal residue - cp  segment;Delayed swallow initiation;Premature spillage to pyriform  sinuses;Penetration/Aspiration before swallow Penetration/Aspiration details (thin cup): Material enters  airway, remains ABOVE vocal cords then ejected out Pharyngeal - Thin Straw: Delayed swallow initiation;Pharyngeal  residue - valleculae;Pharyngeal residue - pyriform  sinuses;Pharyngeal residue - cp  segment;Premature spillage to  pyriform sinuses;Penetration/Aspiration before swallow Penetration/Aspiration details (thin straw): Material enters  airway, remains ABOVE vocal cords then ejected out Pharyngeal - Solids Pharyngeal - Puree: Delayed swallow initiation;Premature spillage  to valleculae;Pharyngeal residue - valleculae Pharyngeal - Mechanical Soft: Delayed swallow  initiation;Premature spillage to valleculae;Pharyngeal residue -  valleculae Pharyngeal Phase - Comment Pharyngeal Comment: improving function from FEES 12/31  Cervical Esophageal Phase    GO    Cervical Esophageal Phase Cervical Esophageal Phase: Impaired Cervical Esophageal Phase - Thin Thin Cup: Reduced cricopharyngeal relaxation Thin Straw: Reduced cricopharyngeal relaxation Cervical Esophageal Phase - Solids Puree: Reduced cricopharyngeal relaxation Mechanical Soft: Reduced cricopharyngeal relaxation Pill: Reduced cricopharyngeal relaxation Cervical Esophageal Phase - Comment Cervical Esophageal Comment: ? due to combination of possible  osteophytes (MD not present) and lower esophageal deficits given  acute diagnosis.         Gabriel Rainwater MA, CCC-SLP [phone]   McCoy Leah Meryl 10/01/2013, 9:59 AM    Ct Angio Abd/pel W/ And/or W/o  09/25/2013   CLINICAL DATA:  Upper GI bleed; bright red blood from nasogastric tube.  EXAM: CT ANGIOGRAPHY ABDOMEN AND PELVIS WITH CONTRAST AND WITHOUT CONTRAST  TECHNIQUE: Multidetector CT imaging of the abdomen and pelvis was performed using the standard protocol during bolus administration of intravenous contrast. Multiplanar reconstructed images including MIPs were obtained and reviewed to evaluate the vascular anatomy.  CONTRAST:  127mL OMNIPAQUE IOHEXOL 350 MG/ML SOLN  COMPARISON:  CT of the abdomen and pelvis performed 09/23/2013  FINDINGS: Trace bilateral pleural effusions are seen, left greater than right, with bibasilar airspace opacification likely reflecting atelectasis.  The known focus of  acute duodenal hemorrhage is better characterized on the prior study, as arterial phase images remain too early to visualize active contrast extravasation at the duodenum. As before, there is apparent wall thickening at the second segment of the duodenum; this may reflect  an underlying mass or a bleeding duodenal ulcer and associated duodenitis. Mildly heterogeneous blood is again seen distending the duodenum, with the duodenum measuring up to 5.0 cm in diameter. This appearance is grossly unchanged from the prior study.  The patient's enteric tube is seen ending at the body of the stomach. The stomach is largely decompressed, aside from a small amount of high attenuation fluid in the antrum, likely reflecting blood tracking from the duodenum.  The nodular contour of the liver is compatible with cirrhotic change. Minimal residual contrast is noted at the Phrygian cap of the gallbladder. The spleen is diminutive, with a few calcified granulomata. There is mild diffuse soft tissue stranding about the pancreas, more prominent than on the prior study. This may reflect the duodenal process, though would correlate with lipase levels. Peripancreatic nodes remain stable in size. The adrenal glands are unremarkable in appearance.  Mild calcification is noted along the abdominal aorta and its branches. There is slight ectasia of the distal abdominal aorta, without evidence of aneurysmal dilatation. The celiac trunk, superior mesenteric artery, bilateral renal arteries and inferior mesenteric artery remain patent. The inferior vena cava is grossly unremarkable in appearance.  Nonspecific perinephric stranding is noted bilaterally. Scarring is noted near the upper pole of the left kidney. Small bilateral renal cysts are seen. The kidneys are otherwise unremarkable. There is no evidence of hydronephrosis. No renal or ureteral stones are seen.  No free fluid is identified. The small bowel is unremarkable in appearance. The  stomach is within normal limits. No acute vascular abnormalities are seen.  The appendix is grossly unremarkable in appearance, without evidence for appendicitis. Scattered diverticulosis is noted along the entirety of the colon, without evidence of diverticulitis.  The bladder is mildly distended and grossly unremarkable in appearance. Mildly increased attenuation within the bladder may reflect residual contrast from the prior study. The prostate remains normal in size, with scattered calcification. No inguinal lymphadenopathy is seen.  No acute osseous abnormalities are identified. The patient's right hip prosthesis is incompletely imaged but appears grossly unremarkable.  Review of the MIP images confirms the above findings.  IMPRESSION: 1. Known focus of acute duodenal hemorrhage is better characterized on the prior study, as arterial phase images remain too early to visualize active contrast extravasation at the duodenum. As before, there is apparent wall thickening of the second segment of the duodenum. This may reflect an underlying mass or bleeding duodenal ulcer, and associated duodenitis. Mildly heterogeneous blood again seen distending the duodenum and tracking back into the gastric antrum. The duodenum measures up to 5.0 cm in diameter, relatively stable from the prior study. 2. Mildly increased diffuse soft tissue stranding about the pancreas. This may reflect the duodenal process, though would correlate with lipase levels to exclude mild pancreatitis. 3. Trace bilateral pleural effusions, left greater than right, with bibasilar airspace opacification likely reflecting atelectasis. 4. Changes of hepatic cirrhosis noted. 5. Mild calcification along the abdominal aorta and its branches. 6. Small bilateral renal cysts; scarring near the upper pole of the left kidney. 7. Scattered diverticulosis along the entirety of the colon, without evidence of diverticulitis.   Electronically Signed   By: Garald Balding  M.D.   On: 09/25/2013 03:59    Oren Binet, MD  [facility] Pager:[phone]  If 7PM-7AM, please contact night-coverage [email or web address] Password [email or web address] 10/04/2013, 4:58 PM   LOS: 11 days

## 2013-10-05 LAB — GLUCOSE, CAPILLARY
Glucose-Capillary: 102 mg/dL — ABNORMAL HIGH (ref 70–99)
Glucose-Capillary: 99 mg/dL (ref 70–99)

## 2013-10-05 LAB — HEMOGLOBIN AND HEMATOCRIT, BLOOD
HCT: 26.8 % — ABNORMAL LOW (ref 39.0–52.0)
Hemoglobin: 8.8 g/dL — ABNORMAL LOW (ref 13.0–17.0)

## 2013-10-05 MED ORDER — AMLODIPINE BESYLATE 2.5 MG PO TABS: 2.5000 mg | ORAL_TABLET | Freq: Every day | ORAL | Status: DC

## 2013-10-05 MED ORDER — BENZONATATE 200 MG PO CAPS: 200.0000 mg | ORAL_CAPSULE | Freq: Three times a day (TID) | ORAL | Status: DC | PRN

## 2013-10-05 MED ORDER — METOPROLOL TARTRATE 25 MG PO TABS: 12.5000 mg | ORAL_TABLET | Freq: Two times a day (BID) | ORAL | Status: DC

## 2013-10-05 MED ORDER — MENTHOL 3 MG MT LOZG: 1.0000 | LOZENGE | OROMUCOSAL | Status: DC | PRN

## 2013-10-05 MED ORDER — PANTOPRAZOLE SODIUM 40 MG PO TBEC: 40.0000 mg | DELAYED_RELEASE_TABLET | Freq: Two times a day (BID) | ORAL | Status: DC

## 2013-10-05 NOTE — Care Management Note (Signed)
  Page 1 of 1   10/05/2013     11:05:14 AM   CARE MANAGEMENT NOTE 10/05/2013  Patient:  [name], [name]   Account Number:  [id number]  Date Initiated:  10/05/2013  Documentation initiated by:  Magdalen Spatz  Subjective/Objective Assessment:     Action/Plan:   Anticipated DC Date:  10/05/2013   Anticipated DC Plan:  [place]         Choice offered to / List presented to:             Status of service:  Completed, signed off Medicare Important Message given?   (If response is "NO", the following Medicare IM given date fields will be blank) Date Medicare IM given:   Date Additional Medicare IM given:    Discharge Disposition:    Per UR Regulation:    If discussed at Long Length of Stay Meetings, dates discussed:    Comments:  10-05-13 Aldona Bar returned call from [facility] , instructed to fax home health orders to her at [phone], same done . Magdalen Spatz RN BSN [phone]    10-05-13 Spoke with patient and daughter [name] . Patient wants to go back to independent living at [facility] [phone]. Orders for Home Health PT / OT .  Called Peggy at [facility] [phone] , Jannifer Rodney has thier own [facility] , instructed to call Sherryll Burger [phone] to set up [facility] , same done left voice mail , also called [facility] and left voice mail. Awaiting call back.  Magdalen Spatz RN BSN [phone]

## 2013-10-05 NOTE — Discharge Summary (Signed)
PATIENT DETAILS Name: [name] Age: 80 y.o. Sex: male Date of Birth: May 06, 1924 MRN: [id number]. Admit Date: 09/23/2013 Admitting Physician: Rigoberto Noel, MD RE:[id number] Stuart Haste, MD  Recommendations for Outpatient Follow-up:  1. Please check CBC next week 2. Patient will need a followup-second look endoscopy in the next few weeks with Dr. Carol Ada  PRIMARY DISCHARGE DIAGNOSIS:  Active Problems:   Acute blood loss anemia   Overweight (BMI 25.0-29.9)   Acute duodenal ulcer with bleeding   Acute esophagitis   Fatty liver   Atrial fibrillation with RVR   Elevated lipase   HTN (hypertension)   AKI (acute kidney injury)   Diabetes mellitus, type 2      PAST MEDICAL HISTORY: Past Medical History  Diagnosis Date  . Hyperlipemia   . CVA (cerebral infarction)   . Hypertension   . Stroke     2011 no deficits   . GERD (gastroesophageal reflux disease)     hx of   . Arthritis   . Ulcer     stomach 2011 related to plavix   . Hx of transfusion of packed red blood cells   . Diabetes mellitus without complication     Diet controlled    DISCHARGE MEDICATIONS:   Medication List    STOP taking these medications       aspirin EC 81 MG tablet     lisinopril 10 MG tablet  Commonly known as:  PRINIVIL,ZESTRIL      TAKE these medications       benzonatate 200 MG capsule  Commonly known as:  TESSALON  Take 1 capsule (200 mg total) by mouth 3 (three) times daily as needed for cough.     ergocalciferol 50000 UNITS capsule  Commonly known as:  VITAMIN D2  Take 50,000 Units by mouth once a week. Patient takes on Mondays     menthol-cetylpyridinium 3 MG lozenge  Commonly known as:  CEPACOL  Take 1 lozenge (3 mg total) by mouth as needed for sore throat.     metoprolol tartrate 25 MG tablet  Commonly known as:  LOPRESSOR  Take 0.5 tablets (12.5 mg total) by mouth 2 (two) times daily.     pantoprazole 40 MG tablet  Commonly known as:  PROTONIX  Take 1 tablet  (40 mg total) by mouth 2 (two) times daily. Twice daily for one month, and then change to once daily indefinitely     rosuvastatin 20 MG tablet  Commonly known as:  CRESTOR  Take 20 mg by mouth every morning.        ALLERGIES:  No Known Allergies  BRIEF HPI:  See H&P, Labs, Consult and Test reports for all details in brief, 80 year old male initially admitted by the critical care service after presenting with signs consistent with acute GI bleeding and was also hypotensive and required pressors. CT of the abdomen done at time of admission revealed a gastric outlet obstruction with marked distention of the stomach and proximal duodenum with contrast extravasation into the distal duodenum. There was also associated peripancreatic stranding in the area near the duodenum. Patient underwent an initial EGD on 09/23/2013 that revealed esophagitis and multiple shallow duodenal ulcerations. A second EGD was completed on 09/25/2013 which revealed a massive duodenal ulcer with a visible vessel which was clipped. The patient subsequently was evaluated by IR for angiography and possible embolization in setting of ongoing active bleeding. There was also concern that the patient may have been experiencing mesenteric ischemia but an  angiogram revealed patent SMA and celiac branches with no source of GI bleeding defined. Patient eventually stabilized from a hemodynamic standpoint. Hemoglobin has remained stable. Patient has received at least 3 units of packed red blood cells since admission.  CONSULTATIONS:   pulmonary/intensive care and GI  PERTINENT RADIOLOGIC STUDIES: Dg Chest 1 View  10/03/2013   CLINICAL DATA:  Persistent cough with leukocytosis  EXAM: CHEST - 1 VIEW  COMPARISON:  09/29/2013  FINDINGS: Improved lower lung aeration, especially in the retrocardiac lung where airspace opacity now appears more linear and atelectatic. Small pleural effusions may persist. Normal heart size. No acute osseous  findings. Remote right AC joint injury.  IMPRESSION: Improving basilar lung aeration.  No evidence of new lung infection.   Electronically Signed   By: Jorje Guild M.D.   On: 10/03/2013 21:08   Dg Chest 1 View  09/23/2013   CLINICAL DATA:  Chest pain  EXAM: CHEST - 1 VIEW  COMPARISON:  04/22/2013  FINDINGS: The heart size and mediastinal contours are within normal limits. Both lungs are clear. The visualized skeletal structures are unremarkable.  IMPRESSION: No active disease.   Electronically Signed   By: Inez Catalina M.D.   On: 09/23/2013 12:22   Dg Chest 2 View  09/29/2013   CLINICAL DATA:  Cough  EXAM: CHEST  2 VIEW  COMPARISON:  09/25/2013  FINDINGS: The cardiac shadow is stable. A right-sided central venous line is again seen and stable. The lungs are well aerated. Mild patchy changes are noted bilaterally in the bases although stable from the prior study. The nasogastric catheter is been removed in the interval. No new focal abnormality is seen.  IMPRESSION: Stable bibasilar changes.  No new focal abnormality is noted.   Electronically Signed   By: Inez Catalina M.D.   On: 09/29/2013 11:19   Ct Abdomen Pelvis W Contrast  09/23/2013   CLINICAL DATA:  Vomiting, epigastric pain  EXAM: CT ABDOMEN AND PELVIS WITH CONTRAST  TECHNIQUE: Multidetector CT imaging of the abdomen and pelvis was performed using the standard protocol following bolus administration of intravenous contrast.  CONTRAST:  155mL OMNIPAQUE IOHEXOL 300 MG/ML  SOLN  COMPARISON:  09/23/2013 abdominal ultrasound  FINDINGS: Minor dependent basilar atelectasis bilaterally. Normal heart size. No pericardial effusion. Trace left pleural effusion. Distal esophagus is patulous and fluid-filled, suspect esophageal dysmotility. Coronary calcifications noted. Degenerative changes of the lower thoracic spine with large osteophytes.  Abdomen: Mild nodularity to the liver surface compatible with cirrhosis. No biliary dilatation. Gallbladder,  biliary system, pancreas, spleen, and adrenal glands are within normal limits for age and demonstrate no acute finding. Incidental splenic calcification compatible granulomatous disease, image 29.  The stomach and proximal duodenum are distended with hyperdense heterogeneous fluid. Third portion of the duodenum demonstrates intraluminal dense contrast. This is compatible with contrast extravasation/active duodenal bleeding. This portion of the duodenum demonstrates circumferential wall thickening. Appearance is compatible with bleeding from a duodenal ulcer with duodenitis versus a duodenum mass.  Kidneys demonstrate hypodense renal cysts bilaterally. Largest cyst in the left kidney midpole measures 13 mm, image 38. No renal obstruction or hydronephrosis. No obstructing urinary tract calculi.  Atherosclerotic changes of the aorta diffusely. Negative for aneurysm or dissection.  No free air, fluid collection, or abscess. Diffuse colonic diverticulosis present.  Pelvis: Extensive sigmoid region diverticulosis. No pelvic free fluid, fluid collection, hemorrhage, abscess, or adenopathy. No inguinal abnormality or hernia. Urinary bladder unremarkable. Previous right hip arthroplasty noted. Diffuse degenerative changes of the spine  and pelvis.  IMPRESSION: Active contrast extravasation/ bleeding within the distal duodenum in an area of duodenal wall thickening compatible with duodenal ulcer disease with duodenitis versus a duodenal mass. Associated gastric outlet obstruction with marked distention of the stomach and proximal duodenum.  Bibasilar atelectasis and trace left pleural fluid  Probable mild hepatic cirrhosis  Aortic atherosclerosis without occlusion  Incidental renal cysts  Colonic diverticulosis  These results were called by telephone at the time of interpretation on 09/23/2013 at 6:35 PM to Dr. Varney Biles , who verbally acknowledged these results.   Electronically Signed   By: Daryll Brod M.D.   On:  09/23/2013 18:49   Ir Angiogram Visceral Selective  09/27/2013   CLINICAL DATA:  Gastrointestinal bleeding. Duodenal ulcer was clipped.  EXAM: SELECTIVE VISCERAL ARTERIOGRAPHY; ADDITIONAL ARTERIOGRAPHY; IR ULTRASOUND GUIDANCE VASC ACCESS RIGHT  MEDICATIONS AND MEDICAL HISTORY: Versed 5 mg, Fentanyl 25 mcg.  ANESTHESIA/SEDATION: Moderate sedation time: 25 minutes  CONTRAST:  80 cc Omnipaque 300  FLUOROSCOPY TIME:  4 min and 48 seconds.  PROCEDURE: The procedure, risks, benefits, and alternatives were explained to the patient. Questions regarding the procedure were encouraged and answered. The patient understands and consents to the procedure.  The right groin was prepped with Betadine in a sterile fashion, and a sterile drape was applied covering the operative field. A sterile gown and sterile gloves were used for the procedure.  Under sonographic guidance, a in 19 gauge needle was inserted into the common femoral artery and removed over a Bentson. A 5 French sheath was inserted. A cobra 2 catheter was advanced over the Bentson wire and into the celiac axis. Contrast was injected. It was advanced over a glidewire into the common hepatic artery and angiography was performed. The catheter was advanced into the gastroduodenal artery and repeat imaging was performed.  The catheter was retracted and advanced over a Bentson wire into the SMA. Angiography was performed in the AP and bilateral oblique views. The cobra catheter was removed. Femoral angiography was performed. Exo seal was deployed for hemostasis without complication  COMPLICATIONS: None  FINDINGS: Injection of the celiac axis demonstrates vascular anatomy and no source of gastrointestinal bleeding. Specifically, injection of the gastroduodenal artery demonstrates no active extravasation of contrast or pseudoaneurysm. Of note, the endoscopic clips within the distal duodenum are apparently beyond the vascular distribution of the gastroduodenal artery.   Injection of the SMA demonstrates no source of active gastrointestinal bleeding. Jejunal branches leading towards the duodenal clips or noted. Embolization was not performed.  IMPRESSION: Gastrointestinal angiography demonstrates no source of bleeding. Both the celiac and SMA were injected. The region of the bleeding noted on endoscopy was beyond the distribution of the gastroduodenal artery, therefore empiric embolization was not performed. The source vessel also not be identified from the SMA injection.   Electronically Signed   By: Maryclare Bean M.D.   On: 09/27/2013 13:21   Ir Angiogram Visceral Selective  09/27/2013   CLINICAL DATA:  Gastrointestinal bleeding. Duodenal ulcer was clipped.  EXAM: SELECTIVE VISCERAL ARTERIOGRAPHY; ADDITIONAL ARTERIOGRAPHY; IR ULTRASOUND GUIDANCE VASC ACCESS RIGHT  MEDICATIONS AND MEDICAL HISTORY: Versed 5 mg, Fentanyl 25 mcg.  ANESTHESIA/SEDATION: Moderate sedation time: 25 minutes  CONTRAST:  80 cc Omnipaque 300  FLUOROSCOPY TIME:  4 min and 48 seconds.  PROCEDURE: The procedure, risks, benefits, and alternatives were explained to the patient. Questions regarding the procedure were encouraged and answered. The patient understands and consents to the procedure.  The right groin was prepped with Betadine  in a sterile fashion, and a sterile drape was applied covering the operative field. A sterile gown and sterile gloves were used for the procedure.  Under sonographic guidance, a in 19 gauge needle was inserted into the common femoral artery and removed over a Bentson. A 5 French sheath was inserted. A cobra 2 catheter was advanced over the Bentson wire and into the celiac axis. Contrast was injected. It was advanced over a glidewire into the common hepatic artery and angiography was performed. The catheter was advanced into the gastroduodenal artery and repeat imaging was performed.  The catheter was retracted and advanced over a Bentson wire into the SMA. Angiography was performed  in the AP and bilateral oblique views. The cobra catheter was removed. Femoral angiography was performed. Exo seal was deployed for hemostasis without complication  COMPLICATIONS: None  FINDINGS: Injection of the celiac axis demonstrates vascular anatomy and no source of gastrointestinal bleeding. Specifically, injection of the gastroduodenal artery demonstrates no active extravasation of contrast or pseudoaneurysm. Of note, the endoscopic clips within the distal duodenum are apparently beyond the vascular distribution of the gastroduodenal artery.  Injection of the SMA demonstrates no source of active gastrointestinal bleeding. Jejunal branches leading towards the duodenal clips or noted. Embolization was not performed.  IMPRESSION: Gastrointestinal angiography demonstrates no source of bleeding. Both the celiac and SMA were injected. The region of the bleeding noted on endoscopy was beyond the distribution of the gastroduodenal artery, therefore empiric embolization was not performed. The source vessel also not be identified from the SMA injection.   Electronically Signed   By: Maryclare Bean M.D.   On: 09/27/2013 13:21   Ir Angiogram Selective Each Additional Vessel  09/27/2013   CLINICAL DATA:  Gastrointestinal bleeding. Duodenal ulcer was clipped.  EXAM: SELECTIVE VISCERAL ARTERIOGRAPHY; ADDITIONAL ARTERIOGRAPHY; IR ULTRASOUND GUIDANCE VASC ACCESS RIGHT  MEDICATIONS AND MEDICAL HISTORY: Versed 5 mg, Fentanyl 25 mcg.  ANESTHESIA/SEDATION: Moderate sedation time: 25 minutes  CONTRAST:  80 cc Omnipaque 300  FLUOROSCOPY TIME:  4 min and 48 seconds.  PROCEDURE: The procedure, risks, benefits, and alternatives were explained to the patient. Questions regarding the procedure were encouraged and answered. The patient understands and consents to the procedure.  The right groin was prepped with Betadine in a sterile fashion, and a sterile drape was applied covering the operative field. A sterile gown and sterile gloves  were used for the procedure.  Under sonographic guidance, a in 19 gauge needle was inserted into the common femoral artery and removed over a Bentson. A 5 French sheath was inserted. A cobra 2 catheter was advanced over the Bentson wire and into the celiac axis. Contrast was injected. It was advanced over a glidewire into the common hepatic artery and angiography was performed. The catheter was advanced into the gastroduodenal artery and repeat imaging was performed.  The catheter was retracted and advanced over a Bentson wire into the SMA. Angiography was performed in the AP and bilateral oblique views. The cobra catheter was removed. Femoral angiography was performed. Exo seal was deployed for hemostasis without complication  COMPLICATIONS: None  FINDINGS: Injection of the celiac axis demonstrates vascular anatomy and no source of gastrointestinal bleeding. Specifically, injection of the gastroduodenal artery demonstrates no active extravasation of contrast or pseudoaneurysm. Of note, the endoscopic clips within the distal duodenum are apparently beyond the vascular distribution of the gastroduodenal artery.  Injection of the SMA demonstrates no source of active gastrointestinal bleeding. Jejunal branches leading towards the duodenal clips or noted.  Embolization was not performed.  IMPRESSION: Gastrointestinal angiography demonstrates no source of bleeding. Both the celiac and SMA were injected. The region of the bleeding noted on endoscopy was beyond the distribution of the gastroduodenal artery, therefore empiric embolization was not performed. The source vessel also not be identified from the SMA injection.   Electronically Signed   By: Maryclare Bean M.D.   On: 09/27/2013 13:21   US Abdomen Limited  09/23/2013   CLINICAL DATA:  Vomiting  EXAM: US ABDOMEN LIMITED - RIGHT UPPER QUADRANT  COMPARISON:  None.  FINDINGS: Gallbladder  No gallstones or wall thickening visualized. There is no pericholecystic fluid. No  sonographic Murphy sign noted.  Common bile duct  Diameter: 3 mm. There is no intrahepatic or extrahepatic biliary duct dilatation.  Liver:  No focal lesion identified. Liver echogenicity is somewhat increased and inhomogeneous.  IMPRESSION: Liver echogenicity is somewhat increased and inhomogeneous. This appearance is consistent with fatty change and/or underlying parenchymal disease. While no focal liver lesions are identified, it must be cautioned that the sensitivity of ultrasound for focal liver lesions is diminished in this circumstance. Study is otherwise unremarkable.   Electronically Signed   By: Lowella Grip M.D.   On: 09/23/2013 14:48   Ir US Guide Vasc Access Right  09/27/2013   CLINICAL DATA:  Gastrointestinal bleeding. Duodenal ulcer was clipped.  EXAM: SELECTIVE VISCERAL ARTERIOGRAPHY; ADDITIONAL ARTERIOGRAPHY; IR ULTRASOUND GUIDANCE VASC ACCESS RIGHT  MEDICATIONS AND MEDICAL HISTORY: Versed 5 mg, Fentanyl 25 mcg.  ANESTHESIA/SEDATION: Moderate sedation time: 25 minutes  CONTRAST:  80 cc Omnipaque 300  FLUOROSCOPY TIME:  4 min and 48 seconds.  PROCEDURE: The procedure, risks, benefits, and alternatives were explained to the patient. Questions regarding the procedure were encouraged and answered. The patient understands and consents to the procedure.  The right groin was prepped with Betadine in a sterile fashion, and a sterile drape was applied covering the operative field. A sterile gown and sterile gloves were used for the procedure.  Under sonographic guidance, a in 19 gauge needle was inserted into the common femoral artery and removed over a Bentson. A 5 French sheath was inserted. A cobra 2 catheter was advanced over the Bentson wire and into the celiac axis. Contrast was injected. It was advanced over a glidewire into the common hepatic artery and angiography was performed. The catheter was advanced into the gastroduodenal artery and repeat imaging was performed.  The catheter was  retracted and advanced over a Bentson wire into the SMA. Angiography was performed in the AP and bilateral oblique views. The cobra catheter was removed. Femoral angiography was performed. Exo seal was deployed for hemostasis without complication  COMPLICATIONS: None  FINDINGS: Injection of the celiac axis demonstrates vascular anatomy and no source of gastrointestinal bleeding. Specifically, injection of the gastroduodenal artery demonstrates no active extravasation of contrast or pseudoaneurysm. Of note, the endoscopic clips within the distal duodenum are apparently beyond the vascular distribution of the gastroduodenal artery.  Injection of the SMA demonstrates no source of active gastrointestinal bleeding. Jejunal branches leading towards the duodenal clips or noted. Embolization was not performed.  IMPRESSION: Gastrointestinal angiography demonstrates no source of bleeding. Both the celiac and SMA were injected. The region of the bleeding noted on endoscopy was beyond the distribution of the gastroduodenal artery, therefore empiric embolization was not performed. The source vessel also not be identified from the SMA injection.   Electronically Signed   By: Maryclare Bean M.D.   On: 09/27/2013 13:21  Dg Chest Port 1 View  09/25/2013   CLINICAL DATA:  Central line placement.  EXAM: PORTABLE CHEST - 1 VIEW  COMPARISON:  Chest radiograph performed 09/23/2013  FINDINGS: The patient's right IJ line is noted ending about the mid to distal SVC. The enteric tube is noted extending below the diaphragm.  The lungs are hypoexpanded. Mild bibasilar atelectasis is noted. No pleural effusion or pneumothorax is seen.  The cardiomediastinal silhouette is normal in size. No acute osseous abnormalities are identified.  IMPRESSION: 1. Right IJ line noted ending about the mid to distal SVC. 2. Lungs hypoexpanded, with mild bibasilar atelectasis.   Electronically Signed   By: Garald Balding M.D.   On: 09/25/2013 00:17   Dg Abd  Portable 1v  09/26/2013   CLINICAL DATA:  Abdominal pain  EXAM: PORTABLE ABDOMEN - 1 VIEW  COMPARISON:  Abdominal films of September 25, 2013.  FINDINGS: The bowel gas pattern does not suggest obstruction or ileus. No abnormal extraluminal gas collections are demonstrated. Portions of the left aspect of the abdomen are excluded from the field-of-view. There is radiodense material in the region of the urinary bladder likely from the previous CT scan. There are surgical clips overlying the right and left L2 transverse processes.  IMPRESSION: There is no evidence of bowel obstruction or ileus. A three-way abdominal series may be useful if further plain film imaging is desired.   Electronically Signed   By: David  Martinique   On: 09/26/2013 11:05   Dg Abd Portable 1v  09/25/2013   CLINICAL DATA:  Gastric bleeding. Concern for small bowel obstruction.  EXAM: PORTABLE ABDOMEN - 1 VIEW  COMPARISON:  CT of the abdomen and pelvis performed 09/23/2013  FINDINGS: The visualized bowel gas pattern is grossly unremarkable. There is no evidence of bowel obstruction. Known active bleeding at the duodenum is not characterized on radiograph. The patient's enteric tube is noted overlying the body of the stomach. No free intra-abdominal air is identified, though evaluation for free air is suboptimal on supine views.  No acute osseous abnormalities are seen. Minimal degenerative change is noted along the lumbar spine. The patient's right hip prosthesis is grossly unremarkable in appearance, though incompletely imaged. Contrast is seen filling the bladder.  IMPRESSION: Unremarkable bowel gas pattern; no free intra-abdominal air seen.   Electronically Signed   By: Garald Balding M.D.   On: 09/25/2013 02:33   Dg Swallowing Func-speech Pathology  10/01/2013   Earle Gell McCoy, CCC-SLP     10/01/2013 10:00 AM Objective Swallowing Evaluation: Modified Barium Swallowing Study   Patient Details  Name: [name] MRN: [id number] Date of  Birth: [birth year]  Today's Date: 10/01/2013 Time: 0930-0950 SLP Time Calculation (min): 20 min  Past Medical History:  Past Medical History  Diagnosis Date  . Hyperlipemia   . CVA (cerebral infarction)   . Hypertension   . Stroke     2011 no deficits   . GERD (gastroesophageal reflux disease)     hx of   . Arthritis   . Ulcer     stomach 2011 related to plavix   . Hx of transfusion of packed red blood cells   . Diabetes mellitus without complication     Diet controlled   Past Surgical History:  Past Surgical History  Procedure Laterality Date  . Total hip arthroplasty Right 04/28/2013    Procedure: RIGHT TOTAL HIP ARTHROPLASTY ANTERIOR APPROACH;   Surgeon: Mauri Pole, MD;  Location: [facility] ORS;  Service:  Orthopedics;  Laterality: Right;  . Esophagogastroduodenoscopy N/A 09/23/2013    Procedure: ESOPHAGOGASTRODUODENOSCOPY (EGD);  Surgeon: Lafayette Dragon, MD;  Location: [facility] ENDOSCOPY;  Service: Endoscopy;   Laterality: N/A;  . Esophagogastroduodenoscopy N/A 09/25/2013    Procedure: ESOPHAGOGASTRODUODENOSCOPY (EGD);  Surgeon: Beryle Beams, MD;  Location[facility] END[facility];  Service: Endoscopy;   Laterality: N/A;   HPI:  80 year old male with PMH of CVA and GERD, initially admitted by  the critical care service after presenting with signs consistent  with acute GI bleeding and was also hypotensive and required  pressors. CT of the abdomen done at time of admission revealed a  gastric outlet obstruction with marked distention of the stomach  and proximal duodenum with contrast extravasation into the distal  duodenum. There was also associated peripancreatic stranding in  the area near the duodenum. Patient underwent an initial EGD on  09/23/2013 that revealed esophagitis and multiple shallow  duodenal ulcerations. A second EGD was completed on 09/25/2013  which revealed a massive duodenal ulcer with a visible vessel  which was clipped. The patient subsequently was evaluated by IR  for angiography and possible embolization in  setting of ongoing  active bleeding. There was also concern that the patient may have  been experiencing mesenteric ischemia but an angiogram revealed  patent SMA and celiac branches with no source of GI bleeding  defined. Patient eventually stabilized from a hemodynamic  standpoint. Hemoglobin has remained stable.      Assessment / Plan / Recommendation Clinical Impression  Dysphagia Diagnosis: Mild pharyngeal phase dysphagia;Moderate  cervical esophageal phase dysphagia Clinical impression: Patient presents with improving overall  function compared to FEES complete 12/31. Patient presents with a  mild pharyngeal  and a moderate cervical esophageal dysphagia. A  delayed swallow initiation results in flash penetration of thin  liquids only. No additional episodes of penetration or aspiration  noted. A tight appearing UES (? due to combination of possible  osteophytes (MD not present) and lower esophageal deficits given  acute diagnosis) result in mild pharyngeal residuals post swallow  (vallecular > pyriform sinus) which are cleared with moderate SLP  cueing for second swallow per bite/sip. Occassional strong cough  response noted despite protection of airway. Prognosis for  continued improvement good with continued improvement in  esophageal/GI function. Aspiration continues to be mild-moderate.  Education complete with patient regarding results and  recommendations. SLP will continue to f/u.     Treatment Recommendation  Therapy as outlined in treatment plan below    Diet Recommendation Thin liquid;Dysphagia 3 (Mechanical Soft)  (full liquids)   Liquid Administration via: Cup;No straw Medication Administration: Whole meds with puree Supervision: Patient able to self feed;Full supervision/cueing  for compensatory strategies Compensations: Slow rate;Small sips/bites;Multiple dry swallows  after each bite/sip Postural Changes and/or Swallow Maneuvers: Seated upright 90  degrees;Upright 30-60 min after meal    Other   Recommendations Oral Care Recommendations: Oral care BID   Follow Up Recommendations  None    Frequency and Duration min 2x/week  2 weeks           General HPI: 80 year old male with PMH of CVA and GERD, initially  admitted by the critical care service after presenting with signs  consistent with acute GI bleeding and was also hypotensive and  required pressors. CT of the abdomen done at time of admission  revealed a gastric outlet obstruction with marked distention of  the stomach and proximal duodenum with contrast extravasation  into the distal  duodenum. There was also associated  peripancreatic stranding in the area near the duodenum. Patient  underwent an initial EGD on 09/23/2013 that revealed esophagitis  and multiple shallow duodenal ulcerations. A second EGD was  completed on 09/25/2013 which revealed a massive duodenal ulcer  with a visible vessel which was clipped. The patient subsequently  was evaluated by IR for angiography and possible embolization in  setting of ongoing active bleeding. There was also concern that  the patient may have been experiencing mesenteric ischemia but an  angiogram revealed patent SMA and celiac branches with no source  of GI bleeding defined. Patient eventually stabilized from a  hemodynamic standpoint. Hemoglobin has remained stable.  Type of Study: Modified Barium Swallowing Study Reason for Referral: Objectively evaluate swallowing function Previous Swallow Assessment: none Diet Prior to this Study: Thin liquids (full liquids) Temperature Spikes Noted: No Respiratory Status: Room air History of Recent Intubation: No Behavior/Cognition: Alert;Cooperative;Pleasant mood Oral Cavity - Dentition: Adequate natural dentition Oral Motor / Sensory Function: Within functional limits Self-Feeding Abilities: Able to feed self Patient Positioning: Upright in chair Baseline Vocal Quality: Clear Volitional Cough: Strong Volitional Swallow: Able to elicit Anatomy: Within functional  limits Pharyngeal Secretions: Standing secretions in (comment) (pyriform  sinuses, eliciting cough response)    Reason for Referral Objectively evaluate swallowing function   Oral Phase Oral Preparation/Oral Phase Oral Phase: WFL   Pharyngeal Phase Pharyngeal Phase Pharyngeal Phase: Impaired Pharyngeal - Nectar Pharyngeal - Nectar Teaspoon: Not tested Pharyngeal - Nectar Cup: Not tested Pharyngeal - Thin Pharyngeal - Thin Cup: Pharyngeal residue - valleculae;Pharyngeal  residue - pyriform sinuses;Pharyngeal residue - cp  segment;Delayed swallow initiation;Premature spillage to pyriform  sinuses;Penetration/Aspiration before swallow Penetration/Aspiration details (thin cup): Material enters  airway, remains ABOVE vocal cords then ejected out Pharyngeal - Thin Straw: Delayed swallow initiation;Pharyngeal  residue - valleculae;Pharyngeal residue - pyriform  sinuses;Pharyngeal residue - cp segment;Premature spillage to  pyriform sinuses;Penetration/Aspiration before swallow Penetration/Aspiration details (thin straw): Material enters  airway, remains ABOVE vocal cords then ejected out Pharyngeal - Solids Pharyngeal - Puree: Delayed swallow initiation;Premature spillage  to valleculae;Pharyngeal residue - valleculae Pharyngeal - Mechanical Soft: Delayed swallow  initiation;Premature spillage to valleculae;Pharyngeal residue -  valleculae Pharyngeal Phase - Comment Pharyngeal Comment: improving function from FEES 12/31  Cervical Esophageal Phase    GO    Cervical Esophageal Phase Cervical Esophageal Phase: Impaired Cervical Esophageal Phase - Thin Thin Cup: Reduced cricopharyngeal relaxation Thin Straw: Reduced cricopharyngeal relaxation Cervical Esophageal Phase - Solids Puree: Reduced cricopharyngeal relaxation Mechanical Soft: Reduced cricopharyngeal relaxation Pill: Reduced cricopharyngeal relaxation Cervical Esophageal Phase - Comment Cervical Esophageal Comment: ? due to combination of possible  osteophytes (MD not  present) and lower esophageal deficits given  acute diagnosis.         Gabriel Rainwater MA, CCC-SLP [phone]   McCoy Leah Meryl 10/01/2013, 9:59 AM    Ct Angio Abd/pel W/ And/or W/o  09/25/2013   CLINICAL DATA:  Upper GI bleed; bright red blood from nasogastric tube.  EXAM: CT ANGIOGRAPHY ABDOMEN AND PELVIS WITH CONTRAST AND WITHOUT CONTRAST  TECHNIQUE: Multidetector CT imaging of the abdomen and pelvis was performed using the standard protocol during bolus administration of intravenous contrast. Multiplanar reconstructed images including MIPs were obtained and reviewed to evaluate the vascular anatomy.  CONTRAST:  167mL OMNIPAQUE IOHEXOL 350 MG/ML SOLN  COMPARISON:  CT of the abdomen and pelvis performed 09/23/2013  FINDINGS: Trace bilateral pleural effusions are seen, left greater than right, with bibasilar airspace opacification likely reflecting  atelectasis.  The known focus of acute duodenal hemorrhage is better characterized on the prior study, as arterial phase images remain too early to visualize active contrast extravasation at the duodenum. As before, there is apparent wall thickening at the second segment of the duodenum; this may reflect an underlying mass or a bleeding duodenal ulcer and associated duodenitis. Mildly heterogeneous blood is again seen distending the duodenum, with the duodenum measuring up to 5.0 cm in diameter. This appearance is grossly unchanged from the prior study.  The patient's enteric tube is seen ending at the body of the stomach. The stomach is largely decompressed, aside from a small amount of high attenuation fluid in the antrum, likely reflecting blood tracking from the duodenum.  The nodular contour of the liver is compatible with cirrhotic change. Minimal residual contrast is noted at the Phrygian cap of the gallbladder. The spleen is diminutive, with a few calcified granulomata. There is mild diffuse soft tissue stranding about the pancreas, more prominent than on the  prior study. This may reflect the duodenal process, though would correlate with lipase levels. Peripancreatic nodes remain stable in size. The adrenal glands are unremarkable in appearance.  Mild calcification is noted along the abdominal aorta and its branches. There is slight ectasia of the distal abdominal aorta, without evidence of aneurysmal dilatation. The celiac trunk, superior mesenteric artery, bilateral renal arteries and inferior mesenteric artery remain patent. The inferior vena cava is grossly unremarkable in appearance.  Nonspecific perinephric stranding is noted bilaterally. Scarring is noted near the upper pole of the left kidney. Small bilateral renal cysts are seen. The kidneys are otherwise unremarkable. There is no evidence of hydronephrosis. No renal or ureteral stones are seen.  No free fluid is identified. The small bowel is unremarkable in appearance. The stomach is within normal limits. No acute vascular abnormalities are seen.  The appendix is grossly unremarkable in appearance, without evidence for appendicitis. Scattered diverticulosis is noted along the entirety of the colon, without evidence of diverticulitis.  The bladder is mildly distended and grossly unremarkable in appearance. Mildly increased attenuation within the bladder may reflect residual contrast from the prior study. The prostate remains normal in size, with scattered calcification. No inguinal lymphadenopathy is seen.  No acute osseous abnormalities are identified. The patient's right hip prosthesis is incompletely imaged but appears grossly unremarkable.  Review of the MIP images confirms the above findings.  IMPRESSION: 1. Known focus of acute duodenal hemorrhage is better characterized on the prior study, as arterial phase images remain too early to visualize active contrast extravasation at the duodenum. As before, there is apparent wall thickening of the second segment of the duodenum. This may reflect an underlying  mass or bleeding duodenal ulcer, and associated duodenitis. Mildly heterogeneous blood again seen distending the duodenum and tracking back into the gastric antrum. The duodenum measures up to 5.0 cm in diameter, relatively stable from the prior study. 2. Mildly increased diffuse soft tissue stranding about the pancreas. This may reflect the duodenal process, though would correlate with lipase levels to exclude mild pancreatitis. 3. Trace bilateral pleural effusions, left greater than right, with bibasilar airspace opacification likely reflecting atelectasis. 4. Changes of hepatic cirrhosis noted. 5. Mild calcification along the abdominal aorta and its branches. 6. Small bilateral renal cysts; scarring near the upper pole of the left kidney. 7. Scattered diverticulosis along the entirety of the colon, without evidence of diverticulitis.   Electronically Signed   By: Garald Balding M.D.   On:  09/25/2013 03:59     PERTINENT LAB RESULTS: CBC:  Recent Labs  10/04/13 0105 10/04/13 1241 10/05/13 0642  WBC 7.6 7.4  --   HGB 8.5* 9.2* 8.8*  HCT 25.6* 27.9* 26.8*  PLT 556* 660*  --    CMET CMP     Component Value Date/Time   NA 138 10/04/2013 0105   K 3.8 10/04/2013 0105   CL 105 10/04/2013 0105   CO2 21 10/04/2013 0105   GLUCOSE 96 10/04/2013 0105   BUN 17 10/04/2013 0105   CREATININE 0.98 10/04/2013 0105   CALCIUM 8.5 10/04/2013 0105   PROT 5.0* 09/29/2013 0500   ALBUMIN 2.3* 09/29/2013 0500   AST 28 09/29/2013 0500   ALT 12 09/29/2013 0500   ALKPHOS 40 09/29/2013 0500   BILITOT 0.6 09/29/2013 0500   GFRNONAA 72* 10/04/2013 0105   GFRAA 83* 10/04/2013 0105    GFR Estimated Creatinine Clearance: 53.1 ml/min (by C-G formula based on Cr of 0.98). No results found for this basename: LIPASE, AMYLASE,  in the last 72 hours No results found for this basename: CKTOTAL, CKMB, CKMBINDEX, TROPONINI,  in the last 72 hours No components found with this basename: POCBNP,  No results found for this basename:  DDIMER,  in the last 72 hours No results found for this basename: HGBA1C,  in the last 72 hours No results found for this basename: CHOL, HDL, LDLCALC, TRIG, CHOLHDL, LDLDIRECT,  in the last 72 hours No results found for this basename: TSH, T4TOTAL, FREET3, T3FREE, THYROIDAB,  in the last 72 hours No results found for this basename: VITAMINB12, FOLATE, FERRITIN, TIBC, IRON, RETICCTPCT,  in the last 72 hours Coags: No results found for this basename: PT, INR,  in the last 72 hours Microbiology: No results found for this or any previous visit (from the past 240 hour(s)).   BRIEF HOSPITAL COURSE:  Upper GI bleed  - Secondary to bleeding duodenal ulcer  - Initially hemodynamically unstable with hypotension, admitted to the critical care unit. He did require pressor support initially. GI was consulted, patient underwent a EGD on 12/24, which showed esophagitis and multiple shallow duodenal ulcers. Because of continued GI bleeding, patient underwent a second endoscopy on 12/26 which showed a massive duodenal ulcer with a visible vessel which was clipped. Subsequently, patient was evaluated by interventional radiology for angiography and possible embolization, however the angiogram revealed patent SMA and patent celiac branches with no source of bleeding. Thankfully patient stabilized, and then was transferred to the step down unit, ultimately upon further stabilization he was transferred to a regular medical bed. He was on IV Protonix throughout the hospital stay, he has now being converted to oral Protonix twice a day. I've spoken with GI M.D. today, Dr. Saralyn Pilar hung, who recommends to continue with twice a day Protonix for a month, and then once daily thereafter indefinitely. Patient will need to followup with Dr. Benson Norway for a second look endoscopy in the next few weeks. Please note patient has had no melanotic stool since 12/29. In his hemoglobin continues to be stable. Biopsy doing endoscopy was negative  for malignancy and H. pylori.   Acute blood loss anemia - Secondary to above. Has acquired a total of 3 units of PRBC this admission. Hemoglobin stable at 8.8 on discharge. - His monitor CBC periodically, suggest rechecking a CBC next week  Cough  -? Suspect Secondary to severe esophagitis  - No evidence of aspiration by speech therapy evaluation,  chest x-ray negative for pneumonia  on 1/3. The patient and daughter at bedside, claim much better today  - Supportive care on discharge.  New onset Atrial fibrillation with RVR  -currently rate controlled  - Likely precipitated by acute illness - For discharge on low-dose beta blocker, not a candidate for anticoagulation and antiplatelet agents and in the future given massive with a visible vessel on a duodenal ulcer.  AKI (acute kidney injury)  -Resolved  - Suspect prerenal etiology from acute blood loss  Hypertension - Previously on lisinopril, given cough and onset of atrial fibrillation, will discharge on low-dose beta blockers. Further optimization can be done in the outpatient setting  Dyslipidemia Resume statins on discharge   TODAY-DAY OF DISCHARGE:  Subjective:   [name] today has no headache,no chest abdominal pain,no new weakness tingling or numbness, feels much better wants to go home today.  Objective:   Blood pressure 120/58, pulse 82, temperature 97.6 F (36.4 C), temperature source Oral, resp. rate 18, height 5\' 9"  (1.753 m), weight 78.254 kg (172 lb 8.3 oz), SpO2 99.00%.  Intake/Output Summary (Last 24 hours) at 10/05/13 0900 Last data filed at 10/05/13 0540  Gross per 24 hour  Intake      0 ml  Output    575 ml  Net   -575 ml   Filed Weights   10/03/13 0710 10/04/13 0632 10/05/13 0540  Weight: 77.9 kg (171 lb 11.8 oz) 77.1 kg (169 lb 15.6 oz) 78.254 kg (172 lb 8.3 oz)    Exam Awake Alert, Oriented *3, No new F.N deficits, Normal affect [place].AT,PERRAL Supple Neck,No JVD, No cervical lymphadenopathy  appriciated.  Symmetrical Chest wall movement, Good air movement bilaterally, CTAB RRR,No Gallops,Rubs or new Murmurs, No Parasternal Heave +ve B.Sounds, Abd Soft, Non tender, No organomegaly appriciated, No rebound -guarding or rigidity. No Cyanosis, Clubbing or edema, No new Rash or bruise  DISCHARGE CONDITION: Stable  DISPOSITION: Independent living with home health services vs. ALF- social worker will discuss with family.  DISCHARGE INSTRUCTIONS:    Activity:  As tolerated with Full fall precautions use walker/cane & assistance as needed  Diet recommendation: Heart Healthy diet Dysphagia 3 diet Aspiration precautions:yes/No  Discharge Orders   Future Orders Complete By Expires   Call MD for:  As directed    Scheduling Instructions:     IF BLACK STOOLS   Diet - low sodium heart healthy  As directed    Scheduling Instructions:     Dysphagia 3 diet   Increase activity slowly  As directed       Follow-up Information   Follow up with [name], MD. Schedule an appointment as soon as possible for a visit in 1 week.   Specialty:  Endocrinology   Contact information:   [place] [address] [phone]       Follow up with Beryle Beams, MD. Schedule an appointment as soon as possible for a visit in 1 month.   Specialty:  Gastroenterology   Contact information:   [address] [place] [address] [phone]       Total Time spent on discharge equals 45 minutes.  SignedOren Binet 10/05/2013 9:00 AM

## 2013-10-05 NOTE — Progress Notes (Signed)
Discharge instructions reviewed with patient and daughter. Questions answered re: changes to home meds. Clarified with M.D. Daughter able to answer teach back questions re: f/u appointments and new med schedule. Printed AVS and prescriptions given to patient's daughter per patient request. Patient d/c'd to home via wheelchair.

## 2013-10-05 NOTE — Progress Notes (Signed)
Speech Language Pathology Treatment: Dysphagia  Patient Details Name: [name] MRN: [id number] DOB: 11-26-1923 Today's Date: 10/05/2013 Time: 8675-4492 SLP Time Calculation (min): 14 min  Assessment / Plan / Recommendation Clinical Impression  Patient in restroom. Daughter present and reports patient with significantly decreased coughing with pos and at baseline. Plans are for discharge today. Educated daughter, provided handout regarding safe swallowing strategies and diet recommendations. Patient safe to slowly advance to regular texture solids as tolerated and under supervision of GI MD. Recommend continued use of multiple swallow and slow rate of intake to increase swallow safety as esophagus healing. Will f/u if patient should remain in hospital.    HPI HPI: 80 year old male with PMH of CVA and GERD, initially admitted by the critical care service after presenting with signs consistent with acute GI bleeding and was also hypotensive and required pressors. CT of the abdomen done at time of admission revealed a gastric outlet obstruction with marked distention of the stomach and proximal duodenum with contrast extravasation into the distal duodenum. There was also associated peripancreatic stranding in the area near the duodenum. Patient underwent an initial EGD on 09/23/2013 that revealed esophagitis and multiple shallow duodenal ulcerations. A second EGD was completed on 09/25/2013 which revealed a massive duodenal ulcer with a visible vessel which was clipped. The patient subsequently was evaluated by IR for angiography and possible embolization in setting of ongoing active bleeding. There was also concern that the patient may have been experiencing mesenteric ischemia but an angiogram revealed patent SMA and celiac branches with no source of GI bleeding defined. Patient eventually stabilized from a hemodynamic standpoint. Hemoglobin has remained stable.       SLP Plan  Continue with current  plan of care    Recommendations Diet recommendations: Dysphagia 3 (mechanical soft);Thin liquid Liquids provided via: Cup Medication Administration: Whole meds with puree Supervision: Patient able to self feed;Intermittent supervision to cue for compensatory strategies Compensations: Slow rate;Small sips/bites;Multiple dry swallows after each bite/sip Postural Changes and/or Swallow Maneuvers: Seated upright 90 degrees;Upright 30-60 min after meal              Oral Care Recommendations: Oral care BID Follow up Recommendations: None Plan: Continue with current plan of care    [place], [place] [phone]   Elnor Renovato Meryl 10/05/2013, 10:37 AM

## 2013-10-05 NOTE — Progress Notes (Signed)
Physical Therapy Treatment Patient Details Name: [name] MRN: [id number] DOB: 21-Apr-1924 Today's Date: 10/05/2013 Time: 6767-2094 PT Time Calculation (min): 23 min  PT Assessment / Plan / Recommendation  History of Present Illness 80 y.o. admitted with GI bleed.   PT Comments   Patient progressing with ambulation and very eager to ambulate. Increased safety awareness with use of RW. Both daughters present and asking questions regarding home health therapies and equipment recommendations.  Follow Up Recommendations  Home health PT     Does the patient have the potential to tolerate intense rehabilitation     Barriers to Discharge        Equipment Recommendations  None recommended by PT    Recommendations for Other Services    Frequency Min 3X/week   Progress towards PT Goals Progress towards PT goals: Progressing toward goals  Plan Current plan remains appropriate    Precautions / Restrictions Precautions Precautions: Fall   Pertinent Vitals/Pain Denied pain    Mobility  Bed Mobility Bed Mobility: Not assessed Transfers Sit to Stand: 5: Supervision;From chair/3-in-1 Stand to Sit: To chair/3-in-1;5: Supervision Ambulation/Gait Ambulation/Gait Assistance: 5: Supervision Ambulation Distance (Feet): 600 Feet Assistive device: Rolling walker Gait Pattern: Step-through pattern;Decreased stride length Gait velocity: decreased    Exercises     PT Diagnosis:    PT Problem List:   PT Treatment Interventions:     PT Goals (current goals can now be found in the care plan section)    Visit Information  Last PT Received On: 10/05/13 Assistance Needed: +1 History of Present Illness: 80 y.o. admitted with GI bleed.    Subjective Data      Cognition  Cognition Arousal/Alertness: Awake/alert Behavior During Therapy: WFL for tasks assessed/performed Overall Cognitive Status: Within Functional Limits for tasks assessed    Balance     End of Session PT - End of  Session Equipment Utilized During Treatment: Gait belt Activity Tolerance: Patient tolerated treatment well Patient left: in chair;with call bell/phone within reach;with family/visitor present Nurse Communication: Mobility status   GP     Jacqualyn Posey 10/05/2013, 11:25 AM 10/05/2013 Jacqualyn Posey PTA [phone] pager [phone] office

## 2013-10-15 DIAGNOSIS — I4891 Unspecified atrial fibrillation: Secondary | ICD-10-CM | POA: Diagnosis not present

## 2013-10-15 DIAGNOSIS — I10 Essential (primary) hypertension: Secondary | ICD-10-CM | POA: Diagnosis not present

## 2013-11-02 DIAGNOSIS — D500 Iron deficiency anemia secondary to blood loss (chronic): Secondary | ICD-10-CM | POA: Diagnosis not present

## 2013-11-02 DIAGNOSIS — K264 Chronic or unspecified duodenal ulcer with hemorrhage: Secondary | ICD-10-CM | POA: Diagnosis not present

## 2013-11-03 DIAGNOSIS — K922 Gastrointestinal hemorrhage, unspecified: Secondary | ICD-10-CM | POA: Diagnosis not present

## 2013-11-03 DIAGNOSIS — I4891 Unspecified atrial fibrillation: Secondary | ICD-10-CM | POA: Diagnosis not present

## 2013-11-03 DIAGNOSIS — IMO0002 Reserved for concepts with insufficient information to code with codable children: Secondary | ICD-10-CM | POA: Diagnosis not present

## 2013-11-03 DIAGNOSIS — E1159 Type 2 diabetes mellitus with other circulatory complications: Secondary | ICD-10-CM | POA: Diagnosis not present

## 2013-11-03 DIAGNOSIS — R5381 Other malaise: Secondary | ICD-10-CM | POA: Diagnosis not present

## 2013-11-03 DIAGNOSIS — R5383 Other fatigue: Secondary | ICD-10-CM | POA: Diagnosis not present

## 2013-11-09 DIAGNOSIS — L600 Ingrowing nail: Secondary | ICD-10-CM | POA: Diagnosis not present

## 2013-11-09 DIAGNOSIS — M2040 Other hammer toe(s) (acquired), unspecified foot: Secondary | ICD-10-CM | POA: Diagnosis not present

## 2013-11-09 DIAGNOSIS — L608 Other nail disorders: Secondary | ICD-10-CM | POA: Diagnosis not present

## 2013-11-25 DIAGNOSIS — E1159 Type 2 diabetes mellitus with other circulatory complications: Secondary | ICD-10-CM | POA: Diagnosis not present

## 2013-11-25 DIAGNOSIS — E039 Hypothyroidism, unspecified: Secondary | ICD-10-CM | POA: Diagnosis not present

## 2013-11-25 DIAGNOSIS — K922 Gastrointestinal hemorrhage, unspecified: Secondary | ICD-10-CM | POA: Diagnosis not present

## 2013-11-25 DIAGNOSIS — I10 Essential (primary) hypertension: Secondary | ICD-10-CM | POA: Diagnosis not present

## 2013-11-25 DIAGNOSIS — Z6825 Body mass index (BMI) 25.0-25.9, adult: Secondary | ICD-10-CM | POA: Diagnosis not present

## 2013-11-25 DIAGNOSIS — M25569 Pain in unspecified knee: Secondary | ICD-10-CM | POA: Diagnosis not present

## 2013-11-25 DIAGNOSIS — I6350 Cerebral infarction due to unspecified occlusion or stenosis of unspecified cerebral artery: Secondary | ICD-10-CM | POA: Diagnosis not present

## 2013-12-03 ENCOUNTER — Other Ambulatory Visit: Payer: Self-pay | Admitting: Gastroenterology

## 2013-12-03 ENCOUNTER — Encounter (HOSPITAL_COMMUNITY): Payer: Self-pay | Admitting: *Deleted

## 2013-12-09 ENCOUNTER — Encounter (HOSPITAL_COMMUNITY): Payer: Self-pay | Admitting: Pharmacy Technician

## 2013-12-18 ENCOUNTER — Encounter (HOSPITAL_COMMUNITY): Payer: Medicare Other | Admitting: Certified Registered Nurse Anesthetist

## 2013-12-18 ENCOUNTER — Ambulatory Visit (HOSPITAL_COMMUNITY): Payer: Medicare Other | Admitting: Certified Registered Nurse Anesthetist

## 2013-12-18 ENCOUNTER — Encounter (HOSPITAL_COMMUNITY)
Admission: RE | Disposition: A | Discharge status: Home / Self Care | Payer: Self-pay | Source: Ambulatory Visit | Attending: Gastroenterology

## 2013-12-18 ENCOUNTER — Encounter (HOSPITAL_COMMUNITY): Payer: Self-pay | Admitting: Anesthesiology

## 2013-12-18 ENCOUNTER — Ambulatory Visit (HOSPITAL_COMMUNITY)
Admission: RE | Admit: 2013-12-18 | Discharge: 2013-12-18 | Disposition: A | Discharge status: Home / Self Care | Payer: Medicare Other | Source: Ambulatory Visit | Attending: Gastroenterology | Admitting: Gastroenterology

## 2013-12-18 DIAGNOSIS — E119 Type 2 diabetes mellitus without complications: Secondary | ICD-10-CM | POA: Diagnosis not present

## 2013-12-18 DIAGNOSIS — Z8673 Personal history of transient ischemic attack (TIA), and cerebral infarction without residual deficits: Secondary | ICD-10-CM | POA: Diagnosis not present

## 2013-12-18 DIAGNOSIS — Z8711 Personal history of peptic ulcer disease: Secondary | ICD-10-CM | POA: Diagnosis not present

## 2013-12-18 DIAGNOSIS — I10 Essential (primary) hypertension: Secondary | ICD-10-CM | POA: Diagnosis not present

## 2013-12-18 DIAGNOSIS — Z96649 Presence of unspecified artificial hip joint: Secondary | ICD-10-CM | POA: Diagnosis not present

## 2013-12-18 DIAGNOSIS — D649 Anemia, unspecified: Secondary | ICD-10-CM | POA: Insufficient documentation

## 2013-12-18 DIAGNOSIS — I4891 Unspecified atrial fibrillation: Secondary | ICD-10-CM | POA: Insufficient documentation

## 2013-12-18 DIAGNOSIS — E785 Hyperlipidemia, unspecified: Secondary | ICD-10-CM | POA: Insufficient documentation

## 2013-12-18 DIAGNOSIS — D500 Iron deficiency anemia secondary to blood loss (chronic): Secondary | ICD-10-CM | POA: Diagnosis not present

## 2013-12-18 DIAGNOSIS — Z79899 Other long term (current) drug therapy: Secondary | ICD-10-CM | POA: Insufficient documentation

## 2013-12-18 DIAGNOSIS — K219 Gastro-esophageal reflux disease without esophagitis: Secondary | ICD-10-CM | POA: Diagnosis not present

## 2013-12-18 DIAGNOSIS — K264 Chronic or unspecified duodenal ulcer with hemorrhage: Secondary | ICD-10-CM | POA: Diagnosis not present

## 2013-12-18 HISTORY — PX: ESOPHAGOGASTRODUODENOSCOPY: SHX5428

## 2013-12-18 HISTORY — DX: Anemia, unspecified: D64.9

## 2013-12-18 SURGERY — EGD (ESOPHAGOGASTRODUODENOSCOPY)
Anesthesia: Monitor Anesthesia Care

## 2013-12-18 MED ORDER — ONDANSETRON HCL 4 MG/2ML IJ SOLN
INTRAMUSCULAR | Status: AC
Start: 2013-12-18 — End: 2013-12-18
  Filled 2013-12-18: qty 2

## 2013-12-18 MED ORDER — ONDANSETRON HCL 4 MG/2ML IJ SOLN
INTRAMUSCULAR | Status: DC | PRN
  Administered 2013-12-18: 4 mg via INTRAVENOUS

## 2013-12-18 MED ORDER — KETAMINE HCL 10 MG/ML IJ SOLN
INTRAMUSCULAR | Status: DC | PRN
  Administered 2013-12-18: 20 mg via INTRAVENOUS

## 2013-12-18 MED ORDER — PROPOFOL 10 MG/ML IV BOLUS
INTRAVENOUS | Status: AC
  Filled 2013-12-18: qty 20

## 2013-12-18 MED ORDER — LIDOCAINE HCL (CARDIAC) 20 MG/ML IV SOLN
INTRAVENOUS | Status: AC
  Filled 2013-12-18: qty 5

## 2013-12-18 MED ORDER — SODIUM CHLORIDE 0.9 % IV SOLN: INTRAVENOUS | Status: DC

## 2013-12-18 MED ORDER — PROPOFOL 10 MG/ML IV BOLUS
INTRAVENOUS | Status: DC | PRN
  Administered 2013-12-18 (×2): 20 mg via INTRAVENOUS
  Administered 2013-12-18: 10 mg via INTRAVENOUS

## 2013-12-18 MED ORDER — LIDOCAINE HCL (CARDIAC) 20 MG/ML IV SOLN
INTRAVENOUS | Status: DC | PRN
  Administered 2013-12-18: 40 mg via INTRAVENOUS

## 2013-12-18 MED ORDER — SODIUM CHLORIDE 0.9 % IJ SOLN
INTRAMUSCULAR | Status: AC
  Filled 2013-12-18: qty 10

## 2013-12-18 MED ORDER — PROPOFOL INFUSION 10 MG/ML OPTIME
INTRAVENOUS | Status: DC | PRN
  Administered 2013-12-18: 75 ug/kg/min via INTRAVENOUS

## 2013-12-18 MED ORDER — LACTATED RINGERS IV SOLN
INTRAVENOUS | Status: DC
  Administered 2013-12-18: 09:00:00 via INTRAVENOUS

## 2013-12-18 MED ORDER — EPHEDRINE SULFATE 50 MG/ML IJ SOLN
INTRAMUSCULAR | Status: AC
  Filled 2013-12-18: qty 1

## 2013-12-18 NOTE — Discharge Instructions (Signed)
Gastrointestinal Endoscopy °Care After °Refer to this sheet in the next few weeks. These instructions provide you with information on caring for yourself after your procedure. Your caregiver may also give you more specific instructions. Your treatment has been planned according to current medical practices, but problems sometimes occur. Call your caregiver if you have any problems or questions after your procedure. °HOME CARE INSTRUCTIONS °· If you were given medicine to help you relax (sedative), do not drive, operate machinery, or sign important documents for 24 hours. °· Avoid alcohol and hot or warm beverages for the first 24 hours after the procedure. °· Only take over-the-counter or prescription medicines for pain, discomfort, or fever as directed by your caregiver. You may resume taking your normal medicines unless your caregiver tells you otherwise. Ask your caregiver when you may resume taking medicines that may cause bleeding, such as aspirin, clopidogrel, or warfarin. °· You may return to your normal diet and activities on the day after your procedure, or as directed by your caregiver. Walking may help to reduce any bloated feeling in your abdomen. °· Drink enough fluids to keep your urine clear or pale yellow. °· You may gargle with salt water if you have a sore throat. °SEEK IMMEDIATE MEDICAL CARE IF: °· You have severe nausea or vomiting. °· You have severe abdominal pain, abdominal cramps that last longer than 6 hours, or abdominal swelling (distention). °· You have severe shoulder or back pain. °· You have trouble swallowing. °· You have shortness of breath, your breathing is shallow, or you are breathing faster than normal. °· You have a fever or a rapid heartbeat. °· You vomit blood or material that looks like coffee grounds. °· You have bloody, black, or tarry stools. °MAKE SURE YOU: °· Understand these instructions. °· Will watch your condition. °· Will get help right away if you are not doing  well or get worse. °Document Released: 05/01/2004 Document Revised: 03/18/2012 Document Reviewed: 12/18/2011 °[employer]® Patient Information ©2014 [employer]. ° °

## 2013-12-18 NOTE — Op Note (Signed)
[facility] [place], [address]   OPERATIVE PROCEDURE REPORT  PATIENT: [name], [name]  MR#: [id number] BIRTHDATE: Feb 06, 1924  GENDER: Male ENDOSCOPIST: Carol Ada, MD ASSISTANT:   William Dalton, technician, Gunnar Fusi, RN, and Tedra Coupe, PROCEDURE DATE: 12/18/2013 PROCEDURE:   EGD ASA CLASS:   Class III INDICATIONS: History of duodenal ulcer with visible vessel MEDICATIONS: MAC sedation, administered by CRNA TOPICAL ANESTHETIC:   none  DESCRIPTION OF PROCEDURE:   After the risks benefits and alternatives of the procedure were thoroughly explained, informed consent was obtained.  The     endoscope was introduced through the mouth  and advanced to the fourth portion of the duodenum Without limitations.      The instrument was slowly withdrawn as the mucosa was fully examined.    FINDINGS: The upper, middle and distal third of the esophagus were carefully inspected and no abnormalities were noted.  The z-line was well seen at the GEJ.  The endoscope was pushed into the fundus which was normal including a retroflexed view.  The antrum, gastric body, first, second, third, and fourth parts of the duodenum were unremarkable.  No evidence of any ulcerations.  There was an area in the distal duodenum that was minimally stenosed that may have been the area of the recent ulceation.   Retroflexed views revealed no abnormalities.     The scope was then withdrawn from the patient and the procedure terminated.  COMPLICATIONS: There were no complications.  IMPRESSION: 1) Normal EGD  RECOMMENDATIONS: 1) Follow up as needed.  _______________________________ eSignedCarol Ada, MD 12/18/2013 9:28 AM

## 2013-12-18 NOTE — Preoperative (Signed)
Beta Blockers   Reason not to administer Beta Blockers: Not given due to HR.

## 2013-12-18 NOTE — Anesthesia Postprocedure Evaluation (Signed)
  Anesthesia Post-op Note  Patient: [name]  Procedure(s) Performed: Procedure(s) (LRB): ESOPHAGOGASTRODUODENOSCOPY (EGD) (N/A)  Patient Location: PACU  Anesthesia Type: MAC  Level of Consciousness: awake and alert   Airway and Oxygen Therapy: Patient Spontanous Breathing  Post-op Pain: mild  Post-op Assessment: Post-op Vital signs reviewed, Patient's Cardiovascular Status Stable, Respiratory Function Stable, Patent Airway and No signs of Nausea or vomiting  Last Vitals:  Filed Vitals:   12/18/13 0950  BP: 172/95  Pulse:   Temp:   Resp: 15    Post-op Vital Signs: stable   Complications: No apparent anesthesia complications

## 2013-12-18 NOTE — Transfer of Care (Signed)
Immediate Anesthesia Transfer of Care Note  Patient: [name]  Procedure(s) Performed: Procedure(s) (LRB): ESOPHAGOGASTRODUODENOSCOPY (EGD) (N/A)  Patient Location: PACU  Anesthesia Type: MAC  Level of Consciousness: sedated, patient cooperative and responds to stimulation  Airway & Oxygen Therapy: Patient Spontanous Breathing and Patient connected to face mask oxgen  Post-op Assessment: Report given to PACU RN and Post -op Vital signs reviewed and stable  Post vital signs: Reviewed and stable  Complications: No apparent anesthesia complications

## 2013-12-18 NOTE — H&P (Signed)
   [name] HPI: This is an 80 year old male with a recent severe GI bleed from the distal duodenum.  It appeared to be the same lesion that was previously identified by Dr. Oletta [name].  Hemoclipping arrested the bleeding and he has remained stable.  He is here today to evaluate the site of bleeding.  If the vascular abnormality exits surgical intervention is recommended.  Past Medical History  Diagnosis Date  . Hyperlipemia   . CVA (cerebral infarction)   . Hypertension   . Stroke     2011 no deficits   . GERD (gastroesophageal reflux disease)     hx of   . Arthritis   . Ulcer     stomach 2011 related to plavix   . Hx of transfusion of packed red blood cells   . Diabetes mellitus without complication     Diet controlled  . Anemia     Past Surgical History  Procedure Laterality Date  . Total hip arthroplasty Right 04/28/2013    Procedure: RIGHT TOTAL HIP ARTHROPLASTY ANTERIOR APPROACH;  Surgeon: Stuart Pole, MD;  Location: [facility];  Service: Orthopedics;  Laterality: Right;  . Esophagogastroduodenoscopy N/A 09/23/2013    Procedure: ESOPHAGOGASTRODUODENOSCOPY (EGD);  Surgeon: Stuart Dragon, MD;  Location: [facility] ENDOSCOPY;  Service: Endoscopy;  Laterality: N/A;  . Esophagogastroduodenoscopy N/A 09/25/2013    Procedure: ESOPHAGOGASTRODUODENOSCOPY (EGD);  Surgeon: Stuart Beams, MD;  Location: [facility] ENDOSCOPY;  Service: Endoscopy;  Laterality: N/A;    History reviewed. No pertinent family history.  Social History:  reports that he has never smoked. He has never used smokeless tobacco. He reports that he drinks about 1.8 ounces of alcohol per week. He reports that he does not use illicit drugs.  Allergies: No Known Allergies  Medications:  Scheduled:  Continuous: . sodium chloride    . lactated ringers      No results found for this or any previous visit (from the past 24 hour(s)).   No results found.  ROS:  As stated above in the HPI otherwise negative.  Blood pressure  162/96, pulse 78, temperature 97.6 F (36.4 C), temperature source Oral, resp. rate 14, height 5\' 9"  (1.753 m), weight 172 lb (78.019 kg), SpO2 100.00%.    PE: Gen: NAD, Alert and Oriented HEENT:  [place]/AT, EOMI Neck: Supple, no LAD Lungs: CTA Bilaterally CV: RRR without M/G/R ABM: Soft, NTND, +BS Ext: No C/C/E  Assessment/Plan: 1) Recent history of severe GI bleed.  Plan: 1) EGD.  [name] D 12/18/2013, 8:59 AM

## 2013-12-18 NOTE — Anesthesia Preprocedure Evaluation (Addendum)
Anesthesia Evaluation  Patient identified by MRN, date of birth, ID band Patient awake    Reviewed: Allergy & Precautions, H&P , NPO status , Patient's Chart, lab work & pertinent test results  Airway Mallampati: II TM Distance: >3 FB Neck ROM: Full    Dental no notable dental hx.    Pulmonary neg pulmonary ROS,  breath sounds clear to auscultation  Pulmonary exam normal       Cardiovascular hypertension, Pt. on medications and Pt. on home beta blockers + dysrhythmias Atrial Fibrillation Rhythm:Regular Rate:Normal     Neuro/Psych CVA, No Residual Symptoms negative psych ROS   GI/Hepatic Neg liver ROS, PUD, GERD-  ,  Endo/Other  negative endocrine ROSdiabetes, Type 2  Renal/GU Renal disease  negative genitourinary   Musculoskeletal negative musculoskeletal ROS (+)   Abdominal   Peds negative pediatric ROS (+)  Hematology  (+) anemia ,   Anesthesia Other Findings   Reproductive/Obstetrics negative OB ROS                          Anesthesia Physical Anesthesia Plan  ASA: III  Anesthesia Plan: MAC   Post-op Pain Management:    Induction: Intravenous  Airway Management Planned:   Additional Equipment:   Intra-op Plan:   Post-operative Plan: Extubation in OR  Informed Consent: I have reviewed the patients History and Physical, chart, labs and discussed the procedure including the risks, benefits and alternatives for the proposed anesthesia with the patient or authorized representative who has indicated his/her understanding and acceptance.   Dental advisory given  Plan Discussed with: CRNA  Anesthesia Plan Comments:        Anesthesia Quick Evaluation

## 2013-12-21 ENCOUNTER — Encounter (HOSPITAL_COMMUNITY): Payer: Self-pay | Admitting: Gastroenterology

## 2014-03-02 ENCOUNTER — Other Ambulatory Visit: Payer: Self-pay | Admitting: Dermatology

## 2014-03-02 DIAGNOSIS — Z85828 Personal history of other malignant neoplasm of skin: Secondary | ICD-10-CM | POA: Diagnosis not present

## 2014-03-02 DIAGNOSIS — D485 Neoplasm of uncertain behavior of skin: Secondary | ICD-10-CM | POA: Diagnosis not present

## 2014-03-02 DIAGNOSIS — C44319 Basal cell carcinoma of skin of other parts of face: Secondary | ICD-10-CM | POA: Diagnosis not present

## 2014-03-02 DIAGNOSIS — L570 Actinic keratosis: Secondary | ICD-10-CM | POA: Diagnosis not present

## 2014-03-02 DIAGNOSIS — L821 Other seborrheic keratosis: Secondary | ICD-10-CM | POA: Diagnosis not present

## 2014-03-03 DIAGNOSIS — E559 Vitamin D deficiency, unspecified: Secondary | ICD-10-CM | POA: Diagnosis not present

## 2014-03-03 DIAGNOSIS — E1159 Type 2 diabetes mellitus with other circulatory complications: Secondary | ICD-10-CM | POA: Diagnosis not present

## 2014-03-03 DIAGNOSIS — E785 Hyperlipidemia, unspecified: Secondary | ICD-10-CM | POA: Diagnosis not present

## 2014-03-03 DIAGNOSIS — I4891 Unspecified atrial fibrillation: Secondary | ICD-10-CM | POA: Diagnosis not present

## 2014-03-03 DIAGNOSIS — I6350 Cerebral infarction due to unspecified occlusion or stenosis of unspecified cerebral artery: Secondary | ICD-10-CM | POA: Diagnosis not present

## 2014-03-03 DIAGNOSIS — N058 Unspecified nephritic syndrome with other morphologic changes: Secondary | ICD-10-CM | POA: Diagnosis not present

## 2014-03-03 DIAGNOSIS — I10 Essential (primary) hypertension: Secondary | ICD-10-CM | POA: Diagnosis not present

## 2014-03-03 DIAGNOSIS — K922 Gastrointestinal hemorrhage, unspecified: Secondary | ICD-10-CM | POA: Diagnosis not present

## 2014-06-02 DIAGNOSIS — E1159 Type 2 diabetes mellitus with other circulatory complications: Secondary | ICD-10-CM | POA: Diagnosis not present

## 2014-06-02 DIAGNOSIS — E039 Hypothyroidism, unspecified: Secondary | ICD-10-CM | POA: Diagnosis not present

## 2014-06-02 DIAGNOSIS — N058 Unspecified nephritic syndrome with other morphologic changes: Secondary | ICD-10-CM | POA: Diagnosis not present

## 2014-06-02 DIAGNOSIS — I10 Essential (primary) hypertension: Secondary | ICD-10-CM | POA: Diagnosis not present

## 2014-06-02 DIAGNOSIS — I4891 Unspecified atrial fibrillation: Secondary | ICD-10-CM | POA: Diagnosis not present

## 2014-06-02 DIAGNOSIS — K922 Gastrointestinal hemorrhage, unspecified: Secondary | ICD-10-CM | POA: Diagnosis not present

## 2014-06-02 DIAGNOSIS — E785 Hyperlipidemia, unspecified: Secondary | ICD-10-CM | POA: Diagnosis not present

## 2014-06-02 DIAGNOSIS — E559 Vitamin D deficiency, unspecified: Secondary | ICD-10-CM | POA: Diagnosis not present

## 2014-06-22 DIAGNOSIS — IMO0002 Reserved for concepts with insufficient information to code with codable children: Secondary | ICD-10-CM | POA: Diagnosis not present

## 2014-06-24 DIAGNOSIS — M5137 Other intervertebral disc degeneration, lumbosacral region: Secondary | ICD-10-CM | POA: Diagnosis not present

## 2014-06-24 DIAGNOSIS — Z96649 Presence of unspecified artificial hip joint: Secondary | ICD-10-CM | POA: Diagnosis not present

## 2014-06-24 DIAGNOSIS — Z4789 Encounter for other orthopedic aftercare: Secondary | ICD-10-CM | POA: Diagnosis not present

## 2014-07-15 DIAGNOSIS — M5136 Other intervertebral disc degeneration, lumbar region: Secondary | ICD-10-CM | POA: Diagnosis not present

## 2014-07-20 DIAGNOSIS — Z23 Encounter for immunization: Secondary | ICD-10-CM | POA: Diagnosis not present

## 2014-09-02 DIAGNOSIS — E039 Hypothyroidism, unspecified: Secondary | ICD-10-CM | POA: Diagnosis not present

## 2014-09-02 DIAGNOSIS — K922 Gastrointestinal hemorrhage, unspecified: Secondary | ICD-10-CM | POA: Diagnosis not present

## 2014-09-02 DIAGNOSIS — R8299 Other abnormal findings in urine: Secondary | ICD-10-CM | POA: Diagnosis not present

## 2014-09-02 DIAGNOSIS — E1165 Type 2 diabetes mellitus with hyperglycemia: Secondary | ICD-10-CM | POA: Diagnosis not present

## 2014-09-02 DIAGNOSIS — Z0000 Encounter for general adult medical examination without abnormal findings: Secondary | ICD-10-CM | POA: Diagnosis not present

## 2014-09-02 DIAGNOSIS — Z6825 Body mass index (BMI) 25.0-25.9, adult: Secondary | ICD-10-CM | POA: Diagnosis not present

## 2014-09-07 ENCOUNTER — Other Ambulatory Visit: Payer: Self-pay | Admitting: Dermatology

## 2014-09-07 DIAGNOSIS — L98499 Non-pressure chronic ulcer of skin of other sites with unspecified severity: Secondary | ICD-10-CM | POA: Diagnosis not present

## 2014-09-07 DIAGNOSIS — L570 Actinic keratosis: Secondary | ICD-10-CM | POA: Diagnosis not present

## 2014-09-07 DIAGNOSIS — D485 Neoplasm of uncertain behavior of skin: Secondary | ICD-10-CM | POA: Diagnosis not present

## 2014-09-07 DIAGNOSIS — Z85828 Personal history of other malignant neoplasm of skin: Secondary | ICD-10-CM | POA: Diagnosis not present

## 2014-09-13 DIAGNOSIS — H26493 Other secondary cataract, bilateral: Secondary | ICD-10-CM | POA: Diagnosis not present

## 2014-09-13 DIAGNOSIS — E119 Type 2 diabetes mellitus without complications: Secondary | ICD-10-CM | POA: Diagnosis not present

## 2014-10-07 DIAGNOSIS — E1165 Type 2 diabetes mellitus with hyperglycemia: Secondary | ICD-10-CM | POA: Diagnosis not present

## 2014-10-07 DIAGNOSIS — K922 Gastrointestinal hemorrhage, unspecified: Secondary | ICD-10-CM | POA: Diagnosis not present

## 2014-10-07 DIAGNOSIS — Z6826 Body mass index (BMI) 26.0-26.9, adult: Secondary | ICD-10-CM | POA: Diagnosis not present

## 2014-10-07 DIAGNOSIS — N08 Glomerular disorders in diseases classified elsewhere: Secondary | ICD-10-CM | POA: Diagnosis not present

## 2014-10-19 DIAGNOSIS — Z85828 Personal history of other malignant neoplasm of skin: Secondary | ICD-10-CM | POA: Diagnosis not present

## 2014-11-16 DIAGNOSIS — E039 Hypothyroidism, unspecified: Secondary | ICD-10-CM | POA: Diagnosis not present

## 2014-11-16 DIAGNOSIS — Z6826 Body mass index (BMI) 26.0-26.9, adult: Secondary | ICD-10-CM | POA: Diagnosis not present

## 2014-11-16 DIAGNOSIS — K922 Gastrointestinal hemorrhage, unspecified: Secondary | ICD-10-CM | POA: Diagnosis not present

## 2014-11-16 DIAGNOSIS — I4891 Unspecified atrial fibrillation: Secondary | ICD-10-CM | POA: Diagnosis not present

## 2014-11-16 DIAGNOSIS — E785 Hyperlipidemia, unspecified: Secondary | ICD-10-CM | POA: Diagnosis not present

## 2014-11-16 DIAGNOSIS — I639 Cerebral infarction, unspecified: Secondary | ICD-10-CM | POA: Diagnosis not present

## 2014-11-16 DIAGNOSIS — E1165 Type 2 diabetes mellitus with hyperglycemia: Secondary | ICD-10-CM | POA: Diagnosis not present

## 2014-11-30 DIAGNOSIS — Z85828 Personal history of other malignant neoplasm of skin: Secondary | ICD-10-CM | POA: Diagnosis not present

## 2014-11-30 DIAGNOSIS — L570 Actinic keratosis: Secondary | ICD-10-CM | POA: Diagnosis not present

## 2015-02-09 DIAGNOSIS — E785 Hyperlipidemia, unspecified: Secondary | ICD-10-CM | POA: Diagnosis not present

## 2015-02-09 DIAGNOSIS — E1165 Type 2 diabetes mellitus with hyperglycemia: Secondary | ICD-10-CM | POA: Diagnosis not present

## 2015-02-09 DIAGNOSIS — I10 Essential (primary) hypertension: Secondary | ICD-10-CM | POA: Diagnosis not present

## 2015-02-09 DIAGNOSIS — E559 Vitamin D deficiency, unspecified: Secondary | ICD-10-CM | POA: Diagnosis not present

## 2015-02-09 DIAGNOSIS — Z6825 Body mass index (BMI) 25.0-25.9, adult: Secondary | ICD-10-CM | POA: Diagnosis not present

## 2015-02-09 DIAGNOSIS — Z1389 Encounter for screening for other disorder: Secondary | ICD-10-CM | POA: Diagnosis not present

## 2015-02-09 DIAGNOSIS — I4891 Unspecified atrial fibrillation: Secondary | ICD-10-CM | POA: Diagnosis not present

## 2015-02-09 DIAGNOSIS — E039 Hypothyroidism, unspecified: Secondary | ICD-10-CM | POA: Diagnosis not present

## 2015-02-09 DIAGNOSIS — G459 Transient cerebral ischemic attack, unspecified: Secondary | ICD-10-CM | POA: Diagnosis not present

## 2015-02-19 IMAGING — US US ABDOMEN LIMITED
1 series · 14 of 23 positions shown · non-contrast
Comparison: None.

CLINICAL DATA: Vomiting

EXAM:
US ABDOMEN LIMITED - RIGHT UPPER QUADRANT

[Series 1: us abdomen limited · 0.27mm/px · 14 of 23 slices shown]
[im 1/23]
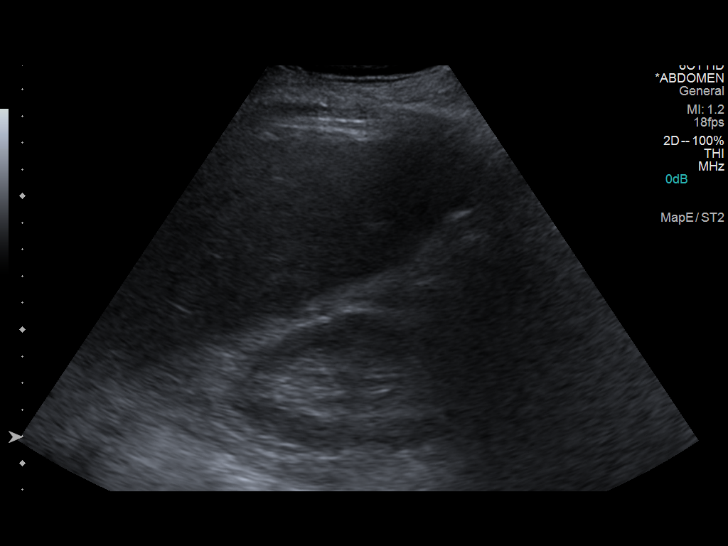
[im 3/23]
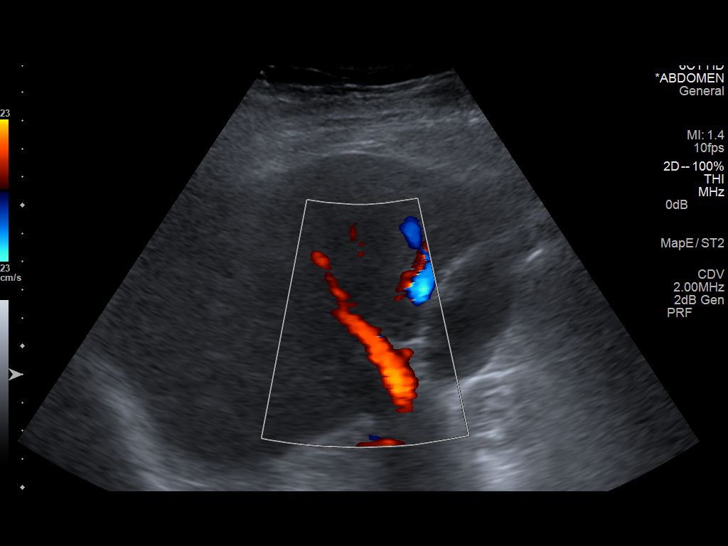
[im 5/23]
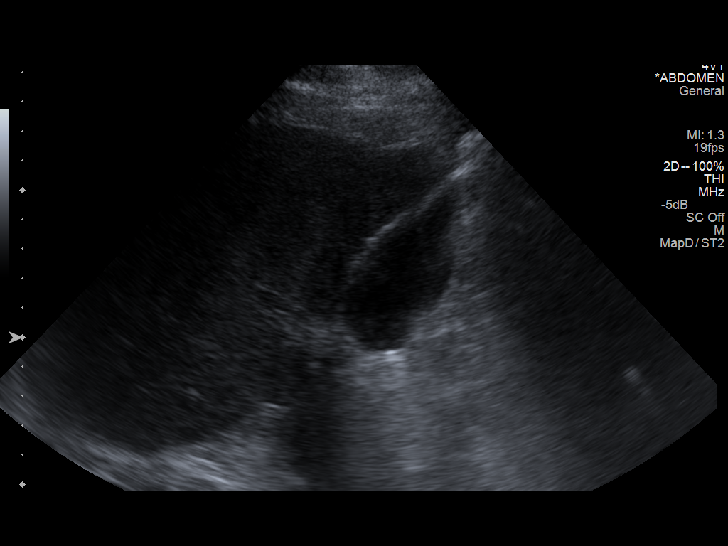
[im 6/23]
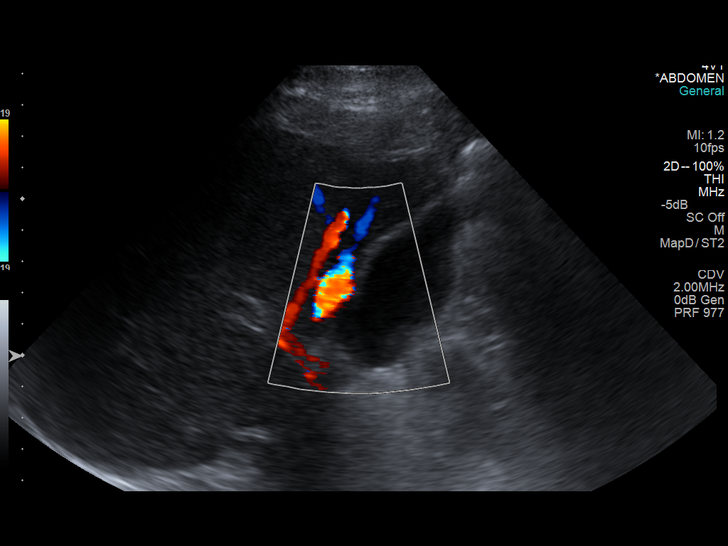
[im 8/23]
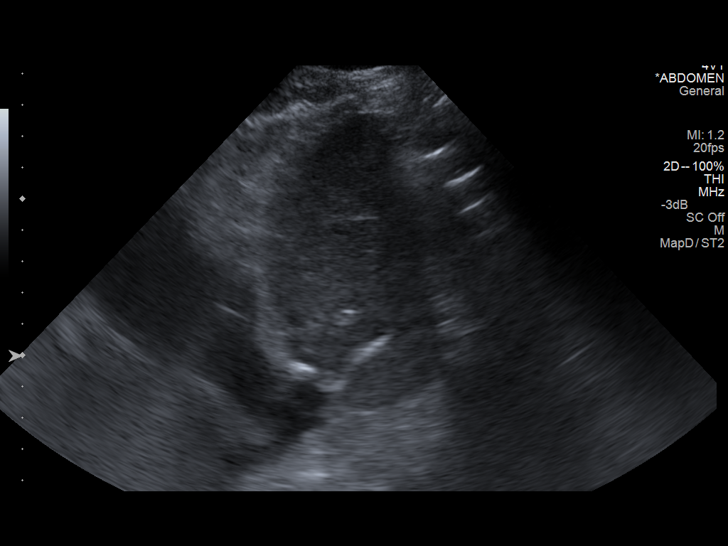
[im 10/23]
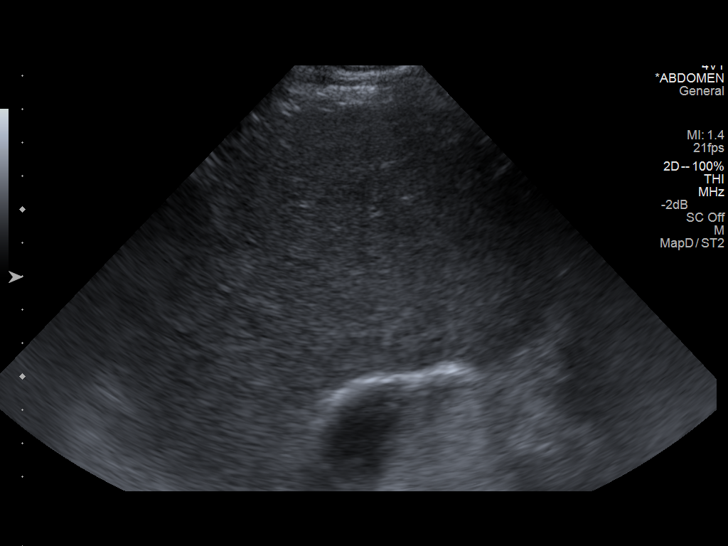
[im 11/23]
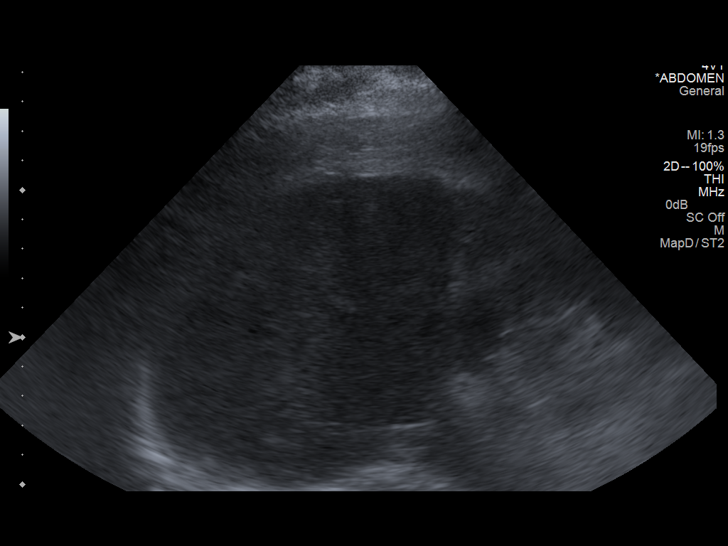
[im 13/23]
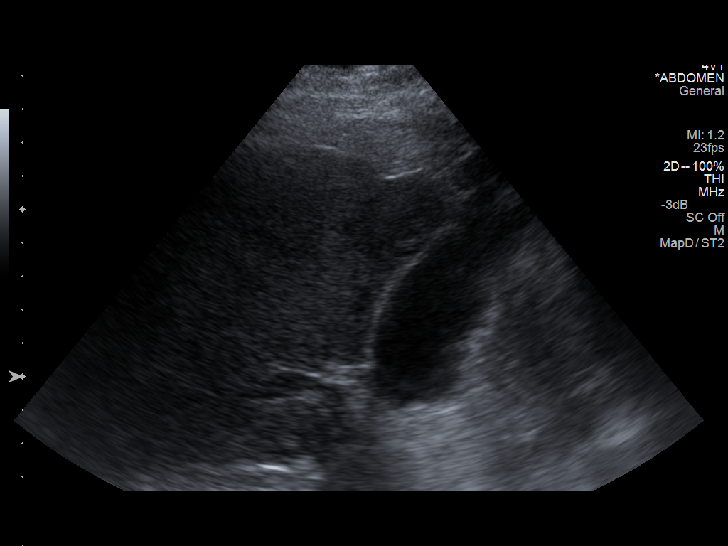
[im 14/23]
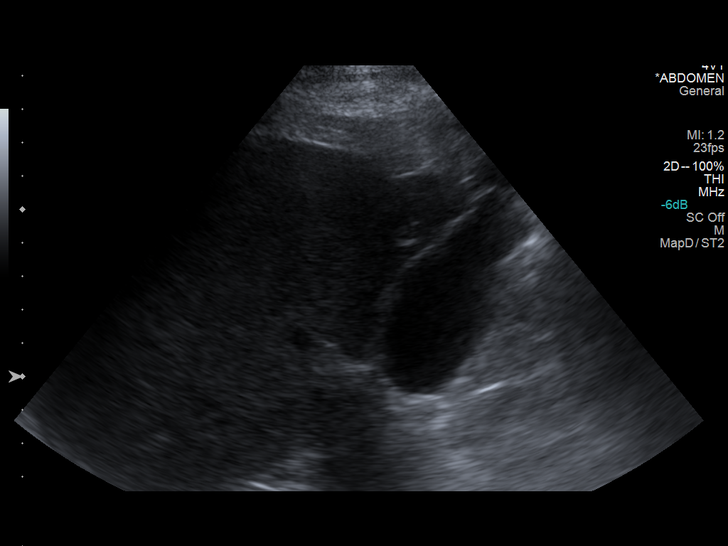
[im 16/23]
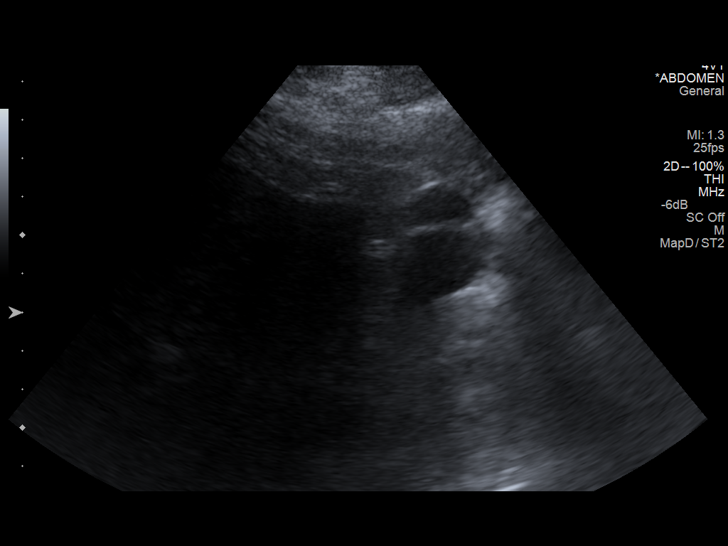
[im 18/23]
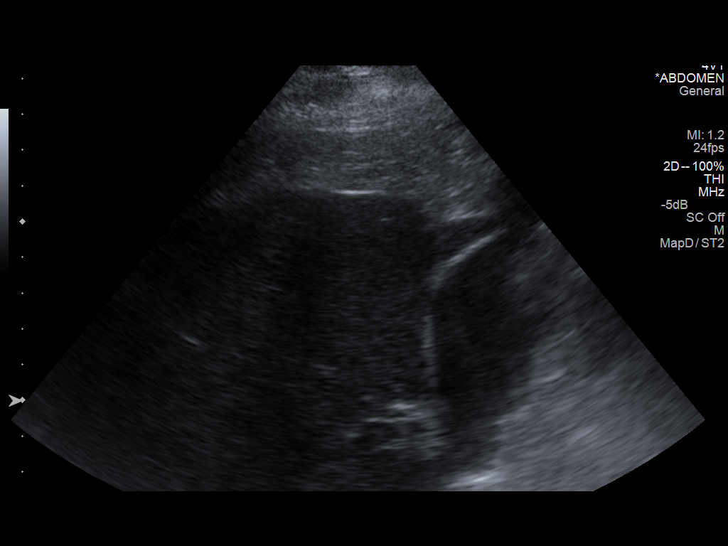
[im 19/23]
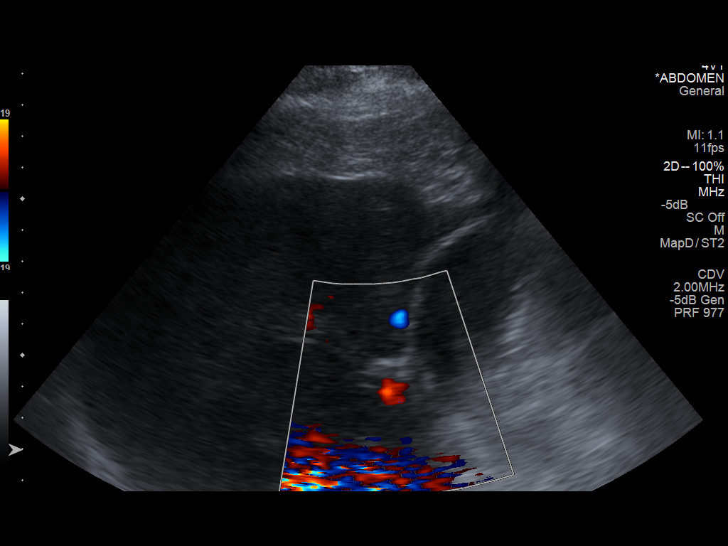
[im 21/23]
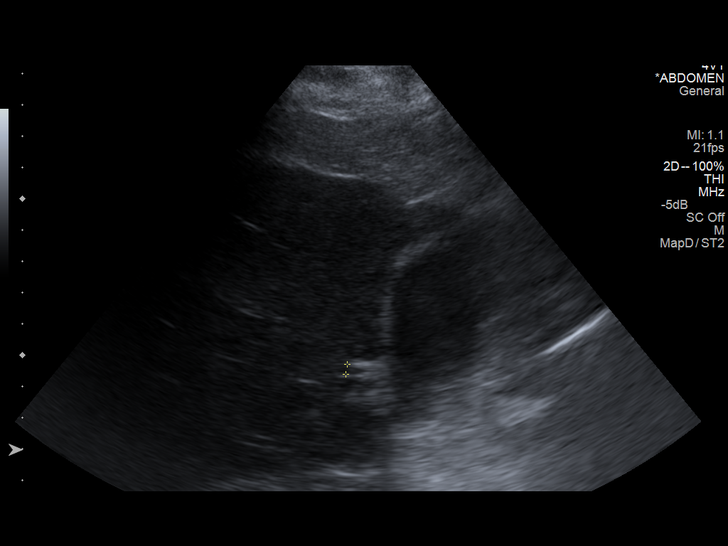
[im 23/23]
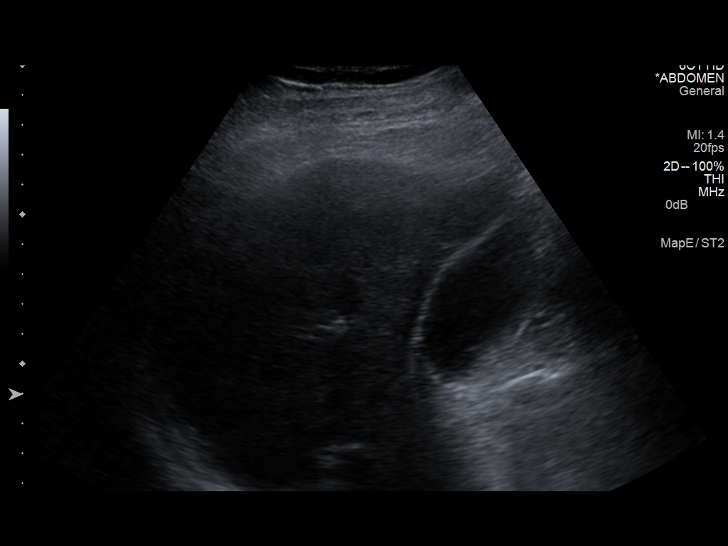

[14 of 23 positions shown; findings below may reference images not displayed]

FINDINGS: Gallbladder

No gallstones or wall thickening visualized. There is no
pericholecystic fluid. No sonographic Murphy sign noted.

Common bile duct

Diameter: 3 mm. There is no intrahepatic or extrahepatic biliary
duct dilatation.

Liver:

No focal lesion identified. Liver echogenicity is somewhat increased
and inhomogeneous.
IMPRESSION: Liver echogenicity is somewhat increased and inhomogeneous. This
appearance is consistent with fatty change and/or underlying
parenchymal disease. While no focal liver lesions are identified, it
must be cautioned that the sensitivity of ultrasound for focal liver
lesions is diminished in this circumstance. Study is otherwise
unremarkable.

## 2015-02-19 IMAGING — CR DG CHEST 1V
1 series · 1 of 1 positions shown · non-contrast
Comparison: 04/22/2013

CLINICAL DATA: Chest pain

EXAM:
CHEST - 1 VIEW

[w chest pa]
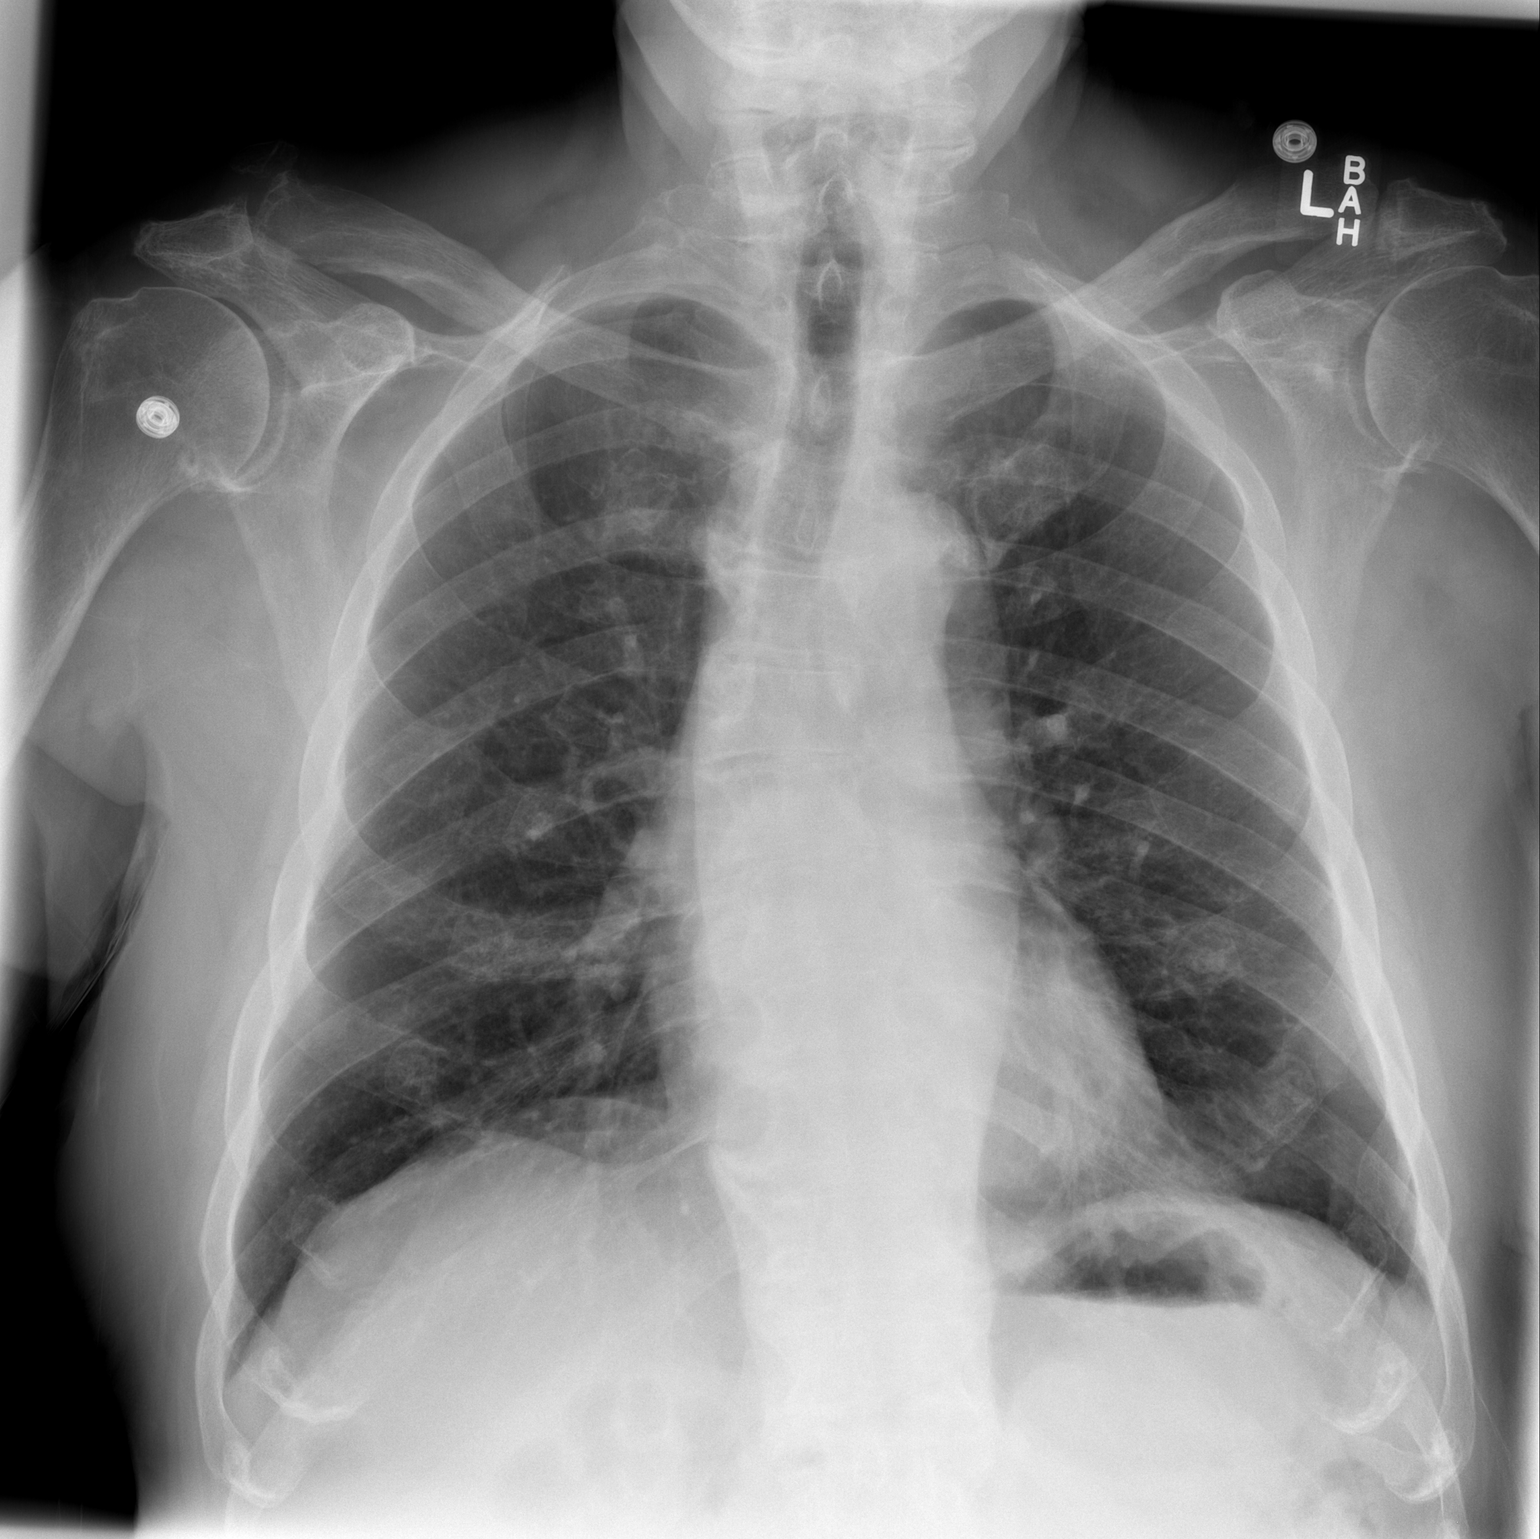

[1 of 1 positions shown; findings below may reference images not displayed]

FINDINGS: The heart size and mediastinal contours are within normal limits.
Both lungs are clear. The visualized skeletal structures are
unremarkable.
IMPRESSION: No active disease.

## 2015-03-15 DIAGNOSIS — D1801 Hemangioma of skin and subcutaneous tissue: Secondary | ICD-10-CM | POA: Diagnosis not present

## 2015-03-15 DIAGNOSIS — L821 Other seborrheic keratosis: Secondary | ICD-10-CM | POA: Diagnosis not present

## 2015-03-15 DIAGNOSIS — Z85828 Personal history of other malignant neoplasm of skin: Secondary | ICD-10-CM | POA: Diagnosis not present

## 2015-03-15 DIAGNOSIS — L820 Inflamed seborrheic keratosis: Secondary | ICD-10-CM | POA: Diagnosis not present

## 2015-03-15 DIAGNOSIS — L570 Actinic keratosis: Secondary | ICD-10-CM | POA: Diagnosis not present

## 2015-03-25 DIAGNOSIS — E039 Hypothyroidism, unspecified: Secondary | ICD-10-CM | POA: Diagnosis not present

## 2015-03-25 DIAGNOSIS — E559 Vitamin D deficiency, unspecified: Secondary | ICD-10-CM | POA: Diagnosis not present

## 2015-03-25 DIAGNOSIS — I10 Essential (primary) hypertension: Secondary | ICD-10-CM | POA: Diagnosis not present

## 2015-03-25 DIAGNOSIS — I6529 Occlusion and stenosis of unspecified carotid artery: Secondary | ICD-10-CM | POA: Diagnosis not present

## 2015-03-25 DIAGNOSIS — E1165 Type 2 diabetes mellitus with hyperglycemia: Secondary | ICD-10-CM | POA: Diagnosis not present

## 2015-03-25 DIAGNOSIS — N08 Glomerular disorders in diseases classified elsewhere: Secondary | ICD-10-CM | POA: Diagnosis not present

## 2015-03-25 DIAGNOSIS — E785 Hyperlipidemia, unspecified: Secondary | ICD-10-CM | POA: Diagnosis not present

## 2015-03-25 DIAGNOSIS — I639 Cerebral infarction, unspecified: Secondary | ICD-10-CM | POA: Diagnosis not present

## 2015-03-25 DIAGNOSIS — I4891 Unspecified atrial fibrillation: Secondary | ICD-10-CM | POA: Diagnosis not present

## 2015-03-25 DIAGNOSIS — K922 Gastrointestinal hemorrhage, unspecified: Secondary | ICD-10-CM | POA: Diagnosis not present

## 2015-03-25 DIAGNOSIS — E1129 Type 2 diabetes mellitus with other diabetic kidney complication: Secondary | ICD-10-CM | POA: Diagnosis not present

## 2015-04-03 ENCOUNTER — Emergency Department (HOSPITAL_COMMUNITY): Payer: Medicare Other

## 2015-04-03 ENCOUNTER — Encounter (HOSPITAL_COMMUNITY): Payer: Self-pay | Admitting: Emergency Medicine

## 2015-04-03 ENCOUNTER — Emergency Department (HOSPITAL_COMMUNITY)
Admission: EM | Admit: 2015-04-03 | Discharge: 2015-04-03 | Disposition: A | Discharge status: Home / Self Care | Payer: Medicare Other | Attending: Emergency Medicine | Admitting: Emergency Medicine

## 2015-04-03 DIAGNOSIS — M25462 Effusion, left knee: Secondary | ICD-10-CM | POA: Diagnosis not present

## 2015-04-03 DIAGNOSIS — K219 Gastro-esophageal reflux disease without esophagitis: Secondary | ICD-10-CM | POA: Insufficient documentation

## 2015-04-03 DIAGNOSIS — I10 Essential (primary) hypertension: Secondary | ICD-10-CM | POA: Diagnosis not present

## 2015-04-03 DIAGNOSIS — Z8673 Personal history of transient ischemic attack (TIA), and cerebral infarction without residual deficits: Secondary | ICD-10-CM | POA: Insufficient documentation

## 2015-04-03 DIAGNOSIS — Z872 Personal history of diseases of the skin and subcutaneous tissue: Secondary | ICD-10-CM | POA: Insufficient documentation

## 2015-04-03 DIAGNOSIS — E785 Hyperlipidemia, unspecified: Secondary | ICD-10-CM | POA: Diagnosis not present

## 2015-04-03 DIAGNOSIS — Z862 Personal history of diseases of the blood and blood-forming organs and certain disorders involving the immune mechanism: Secondary | ICD-10-CM | POA: Diagnosis not present

## 2015-04-03 DIAGNOSIS — Z79899 Other long term (current) drug therapy: Secondary | ICD-10-CM | POA: Insufficient documentation

## 2015-04-03 DIAGNOSIS — M1712 Unilateral primary osteoarthritis, left knee: Secondary | ICD-10-CM | POA: Diagnosis not present

## 2015-04-03 DIAGNOSIS — E119 Type 2 diabetes mellitus without complications: Secondary | ICD-10-CM | POA: Insufficient documentation

## 2015-04-03 NOTE — ED Notes (Signed)
Per pt, pt states overnight his left knee became swollen. Denies injury. Pt able to bend knee somewhat with some pain. No redness noted. Sensation, color intact, knee is warm.

## 2015-04-03 NOTE — ED Provider Notes (Addendum)
CSN: [id number]     Arrival date & time 04/03/15  1048 History   First MD Initiated Contact with Patient 04/03/15 1119     Chief Complaint  Patient presents with  . Joint Swelling     (Consider location/radiation/quality/duration/timing/severity/associated sxs/prior Treatment) HPI   [name] is a 81 y.o. male who presents for evaluation of left knee swelling, since yesterday. No known trauma.    Past Medical History  Diagnosis Date  . Hyperlipemia   . CVA (cerebral infarction)   . Hypertension   . Stroke     2011 no deficits   . GERD (gastroesophageal reflux disease)     hx of   . Arthritis   . Ulcer     stomach 2011 related to plavix   . Hx of transfusion of packed red blood cells   . Diabetes mellitus without complication     Diet controlled  . Anemia    Past Surgical History  Procedure Laterality Date  . Total hip arthroplasty Right 04/28/2013    Procedure: RIGHT TOTAL HIP ARTHROPLASTY ANTERIOR APPROACH;  Surgeon: Mauri Pole, MD;  Location: [facility];  Service: Orthopedics;  Laterality: Right;  . Esophagogastroduodenoscopy N/A 09/23/2013    Procedure: ESOPHAGOGASTRODUODENOSCOPY (EGD);  Surgeon: Lafayette Dragon, MD;  Location: [facility] ENDOSCOPY;  Service: Endoscopy;  Laterality: N/A;  . Esophagogastroduodenoscopy N/A 09/25/2013    Procedure: ESOPHAGOGASTRODUODENOSCOPY (EGD);  Surgeon: Beryle Beams, MD;  Location: [facility] ENDOSCOPY;  Service: Endoscopy;  Laterality: N/A;  . Esophagogastroduodenoscopy N/A 12/18/2013    Procedure: ESOPHAGOGASTRODUODENOSCOPY (EGD);  Surgeon: Beryle Beams, MD;  Location: [facility] ENDOSCOPY;  Service: Endoscopy;  Laterality: N/A;   No family history on file. History  Substance Use Topics  . Smoking status: Never Smoker   . Smokeless tobacco: Never Used  . Alcohol Use: 1.8 oz/week    3 Shots of liquor per week     Comment: wine or vodka     Review of Systems    Allergies  Review of patient's allergies indicates no known allergies.  Home  Medications   Prior to Admission medications   Medication Sig Start Date End Date Taking? Authorizing Provider  ergocalciferol (VITAMIN D2) 50000 UNITS capsule Take 50,000 Units by mouth once a week. Patient takes on Mondays    Historical Provider, MD  levothyroxine (SYNTHROID, LEVOTHROID) 50 MCG tablet Take 50 mcg by mouth daily before breakfast.    Historical Provider, MD  metoprolol tartrate (LOPRESSOR) 25 MG tablet Take 12.5 mg by mouth 2 (two) times daily.    Historical Provider, MD  pantoprazole (PROTONIX) 40 MG tablet Take 40 mg by mouth daily.    Historical Provider, MD  rosuvastatin (CRESTOR) 20 MG tablet Take 20 mg by mouth every morning.     Historical Provider, MD   BP 143/85 mmHg  Pulse 73  Temp(Src) 98 F (36.7 C) (Oral)  Resp 16  Ht 5\' 9"  (1.753 m)  Wt 170 lb (77.111 kg)  BMI 25.09 kg/m2  SpO2 100% Physical Exam  Constitutional: He is oriented to person, place, and time. He appears well-developed.  Elderly, frail.  HENT:  Head: Normocephalic and atraumatic.  Right Ear: External ear normal.  Left Ear: External ear normal.  Eyes: Conjunctivae and EOM are normal. Pupils are equal, round, and reactive to light.  Neck: Normal range of motion and phonation normal. Neck supple.  Cardiovascular: Normal rate, regular rhythm and normal heart sounds.   Pulmonary/Chest: Effort normal and breath sounds normal. He exhibits  no bony tenderness.  Musculoskeletal:  Left knee is swollen with effusion and decreased ROM d/t pain. No instability or inability to move knee.  Neurological: He is alert and oriented to person, place, and time. No cranial nerve deficit or sensory deficit. He exhibits normal muscle tone. Coordination normal.  Skin: Skin is warm, dry and intact.  Psychiatric: He has a normal mood and affect. His behavior is normal. Judgment and thought content normal.  Nursing note and vitals reviewed.   ED Course  Procedures (including critical care time) Labs Review Labs  Reviewed - No data to display  Imaging Review Dg Knee Complete 4 Views Left  04/03/2015   CLINICAL DATA:  Woke this morning w/ knee stiffness, inability to bear weight left knee; no injury; no hx of injury, limited range of flexionHx arthritis  EXAM: LEFT KNEE - COMPLETE 4+ VIEW  COMPARISON:  None.  FINDINGS: There is narrowing of the articular cartilage in the medial compartment with some subchondral sclerosis in the medial femoral condyle and medial tibial plateau, and early marginal spur formation. Small spur from the lateral tibial plateau. There is a mild varus deformity. Early chondrocalcinosis suggesting CPPD. Small spurs from the patellar articular surface and prominent traction spurs from the patella. No fracture or dislocation. Extensive arterial calcifications noted.  IMPRESSION: 1. Negative for fracture or other acute bone abnormality. 2. Tricompartmental degenerative change, most marked medially. 3. Arterial calcifications.   Electronically Signed   By: Lucrezia Europe M.D.   On: 04/03/2015 12:30     EKG Interpretation None      MDM   Final diagnoses:  Primary osteoarthritis of left knee  Knee effusion, left    Degenerative joint disease of knee, with effusion, likely reactive. Doubt septic arthritis, fracture, or metabolic instability.   Nursing Notes Reviewed/ Care Coordinated Applicable Imaging Reviewed Interpretation of Laboratory Data incorporated into ED treatment  The patient appears reasonably screened and/or stabilized for discharge and I doubt any other medical condition or other [facility] requiring further screening, evaluation, or treatment in the ED at this time prior to discharge.  Plan: Home Medications- Tylenol or Advil; Home Treatments- knee immobilizer when necessary ambulation; return here if the recommended treatment, does not improve the symptoms; Recommended follow up- orthopedic follow-up 1 week   Daleen Bo, MD 04/03/15 1646  Daleen Bo, MD 04/17/15  1459

## 2015-04-03 NOTE — Discharge Instructions (Signed)
Wear the immobilizer, when you're up to help you walk. Follow-up with your orthopedic doctor for checkup in one to 2 weeks. For pain, use Motrin or Tylenol.   Arthritis, Nonspecific Arthritis is pain, redness, warmth, or puffiness (inflammation) of a joint. The joint may be stiff or hurt when you move it. One or more joints may be affected. There are many types of arthritis. Your doctor may not know what type you have right away. The most common cause of arthritis is wear and tear on the joint (osteoarthritis). HOME CARE   Only take medicine as told by your doctor.  Rest the joint as much as possible.  Raise (elevate) your joint if it is puffy.  Use crutches if the painful joint is in your leg.  Drink enough fluids to keep your pee (urine) clear or pale yellow.  Follow your doctor's diet instructions.  Use cold packs for very bad joint pain for 10 to 15 minutes every hour. Ask your doctor if it is okay for you to use hot packs.  Exercise as told by your doctor.  Take a warm shower if you have stiffness in the morning.  Move your sore joints throughout the day. GET HELP RIGHT AWAY IF:   You have a fever.  You have very bad joint pain, puffiness, or redness.  You have many joints that are painful and puffy.  You are not getting better with treatment.  You have very bad back pain or leg weakness.  You cannot control when you poop (bowel movement) or pee (urinate).  You do not feel better in 24 hours or are getting worse.  You are having side effects from your medicine. MAKE SURE YOU:   Understand these instructions.  Will watch your condition.  Will get help right away if you are not doing well or get worse. Document Released: 12/12/2009 Document Revised: 03/18/2012 Document Reviewed: 12/12/2009 [facility] Patient Information 2015 [place], [place]. This information is not intended to replace advice given to you by your health care provider. Make sure you discuss any  questions you have with your health care provider.  Knee Effusion The medical term for having fluid in your knee is effusion. This is often due to an internal derangement of the knee. This means something is wrong inside the knee. Some of the causes of fluid in the knee may be torn cartilage, a torn ligament, or bleeding into the joint from an injury. Your knee is likely more difficult to bend and move. This is often because there is increased pain and pressure in the joint. The time it takes for recovery from a knee effusion depends on different factors, including:   Type of injury.  Your age.  Physical and medical conditions.  Rehabilitation Strategies. How long you will be away from your normal activities will depend on what kind of knee problem you have and how much damage is present. Your knee has two types of cartilage. Articular cartilage covers the bone ends and lets your knee bend and move smoothly. Two menisci, thick pads of cartilage that form a rim inside the joint, help absorb shock and stabilize your knee. Ligaments bind the bones together and support your knee joint. Muscles move the joint, help support your knee, and take stress off the joint itself. CAUSES  Often an effusion in the knee is caused by an injury to one of the menisci. This is often a tear in the cartilage. Recovery after a meniscus injury depends on how  much meniscus is damaged and whether you have damaged other knee tissue. Small tears may heal on their own with conservative treatment. Conservative means rest, limited weight bearing activity and muscle strengthening exercises. Your recovery may take up to 6 weeks.  TREATMENT  Larger tears may require surgery. Meniscus injuries may be treated during arthroscopy. Arthroscopy is a procedure in which your surgeon uses a small telescope like instrument to look in your knee. Your caregiver can make a more accurate diagnosis (learning what is wrong) by performing an  arthroscopic procedure. If your injury is on the inner margin of the meniscus, your surgeon may trim the meniscus back to a smooth rim. In other cases your surgeon will try to repair a damaged meniscus with stitches (sutures). This may make rehabilitation take longer, but may provide better long term result by helping your knee keep its shock absorption capabilities. Ligaments which are completely torn usually require surgery for repair. HOME CARE INSTRUCTIONS  Use crutches as instructed.  If a brace is applied, use as directed.  Once you are home, an ice pack applied to your swollen knee may help with discomfort and help decrease swelling.  Keep your knee raised (elevated) when you are not up and around or on crutches.  Only take over-the-counter or prescription medicines for pain, discomfort, or fever as directed by your caregiver.  Your caregivers will help with instructions for rehabilitation of your knee. This often includes strengthening exercises.  You may resume a normal diet and activities as directed. SEEK MEDICAL CARE IF:   There is increased swelling in your knee.  You notice redness, swelling, or increasing pain in your knee.  An unexplained oral temperature above 102 F (38.9 C) develops. SEEK IMMEDIATE MEDICAL CARE IF:   You develop a rash.  You have difficulty breathing.  You have any allergic reactions from medications you may have been given.  There is severe pain with any motion of the knee. MAKE SURE YOU:   Understand these instructions.  Will watch your condition.  Will get help right away if you are not doing well or get worse. Document Released: 12/08/2003 Document Revised: 12/10/2011 Document Reviewed: 02/11/2008 [facility] Patient Information 2015 [place], [place]. This information is not intended to replace advice given to you by your health care provider. Make sure you discuss any questions you have with your health care provider.

## 2015-04-03 NOTE — ED Notes (Signed)
PT back from [facility]

## 2015-04-15 DIAGNOSIS — M1712 Unilateral primary osteoarthritis, left knee: Secondary | ICD-10-CM | POA: Diagnosis not present

## 2015-04-15 DIAGNOSIS — M19031 Primary osteoarthritis, right wrist: Secondary | ICD-10-CM | POA: Diagnosis not present

## 2015-05-18 DIAGNOSIS — E039 Hypothyroidism, unspecified: Secondary | ICD-10-CM | POA: Diagnosis not present

## 2015-05-18 DIAGNOSIS — M1990 Unspecified osteoarthritis, unspecified site: Secondary | ICD-10-CM | POA: Diagnosis not present

## 2015-05-18 DIAGNOSIS — N08 Glomerular disorders in diseases classified elsewhere: Secondary | ICD-10-CM | POA: Diagnosis not present

## 2015-05-18 DIAGNOSIS — I10 Essential (primary) hypertension: Secondary | ICD-10-CM | POA: Diagnosis not present

## 2015-05-18 DIAGNOSIS — I639 Cerebral infarction, unspecified: Secondary | ICD-10-CM | POA: Diagnosis not present

## 2015-05-18 DIAGNOSIS — E559 Vitamin D deficiency, unspecified: Secondary | ICD-10-CM | POA: Diagnosis not present

## 2015-05-18 DIAGNOSIS — E785 Hyperlipidemia, unspecified: Secondary | ICD-10-CM | POA: Diagnosis not present

## 2015-05-18 DIAGNOSIS — E1165 Type 2 diabetes mellitus with hyperglycemia: Secondary | ICD-10-CM | POA: Diagnosis not present

## 2015-05-18 DIAGNOSIS — I4891 Unspecified atrial fibrillation: Secondary | ICD-10-CM | POA: Diagnosis not present

## 2015-05-18 DIAGNOSIS — K922 Gastrointestinal hemorrhage, unspecified: Secondary | ICD-10-CM | POA: Diagnosis not present

## 2015-05-18 DIAGNOSIS — Z6825 Body mass index (BMI) 25.0-25.9, adult: Secondary | ICD-10-CM | POA: Diagnosis not present

## 2015-07-29 DIAGNOSIS — M1712 Unilateral primary osteoarthritis, left knee: Secondary | ICD-10-CM | POA: Diagnosis not present

## 2015-08-23 DIAGNOSIS — E1129 Type 2 diabetes mellitus with other diabetic kidney complication: Secondary | ICD-10-CM | POA: Diagnosis not present

## 2015-08-23 DIAGNOSIS — I4891 Unspecified atrial fibrillation: Secondary | ICD-10-CM | POA: Diagnosis not present

## 2015-08-23 DIAGNOSIS — E784 Other hyperlipidemia: Secondary | ICD-10-CM | POA: Diagnosis not present

## 2015-08-23 DIAGNOSIS — Z6824 Body mass index (BMI) 24.0-24.9, adult: Secondary | ICD-10-CM | POA: Diagnosis not present

## 2015-08-23 DIAGNOSIS — E038 Other specified hypothyroidism: Secondary | ICD-10-CM | POA: Diagnosis not present

## 2015-08-23 DIAGNOSIS — I10 Essential (primary) hypertension: Secondary | ICD-10-CM | POA: Diagnosis not present

## 2015-08-23 DIAGNOSIS — I639 Cerebral infarction, unspecified: Secondary | ICD-10-CM | POA: Diagnosis not present

## 2015-08-23 DIAGNOSIS — K922 Gastrointestinal hemorrhage, unspecified: Secondary | ICD-10-CM | POA: Diagnosis not present

## 2015-08-23 DIAGNOSIS — N08 Glomerular disorders in diseases classified elsewhere: Secondary | ICD-10-CM | POA: Diagnosis not present

## 2015-08-23 DIAGNOSIS — I6529 Occlusion and stenosis of unspecified carotid artery: Secondary | ICD-10-CM | POA: Diagnosis not present

## 2015-08-23 DIAGNOSIS — E559 Vitamin D deficiency, unspecified: Secondary | ICD-10-CM | POA: Diagnosis not present

## 2015-09-15 DIAGNOSIS — L821 Other seborrheic keratosis: Secondary | ICD-10-CM | POA: Diagnosis not present

## 2015-09-15 DIAGNOSIS — D485 Neoplasm of uncertain behavior of skin: Secondary | ICD-10-CM | POA: Diagnosis not present

## 2015-09-15 DIAGNOSIS — D044 Carcinoma in situ of skin of scalp and neck: Secondary | ICD-10-CM | POA: Diagnosis not present

## 2015-09-15 DIAGNOSIS — D1801 Hemangioma of skin and subcutaneous tissue: Secondary | ICD-10-CM | POA: Diagnosis not present

## 2015-09-15 DIAGNOSIS — Z85828 Personal history of other malignant neoplasm of skin: Secondary | ICD-10-CM | POA: Diagnosis not present

## 2015-09-15 DIAGNOSIS — L570 Actinic keratosis: Secondary | ICD-10-CM | POA: Diagnosis not present

## 2015-10-17 DIAGNOSIS — N390 Urinary tract infection, site not specified: Secondary | ICD-10-CM | POA: Diagnosis not present

## 2015-10-17 DIAGNOSIS — R8299 Other abnormal findings in urine: Secondary | ICD-10-CM | POA: Diagnosis not present

## 2015-11-21 DIAGNOSIS — H52223 Regular astigmatism, bilateral: Secondary | ICD-10-CM | POA: Diagnosis not present

## 2015-11-21 DIAGNOSIS — E119 Type 2 diabetes mellitus without complications: Secondary | ICD-10-CM | POA: Diagnosis not present

## 2015-11-21 DIAGNOSIS — H26493 Other secondary cataract, bilateral: Secondary | ICD-10-CM | POA: Diagnosis not present

## 2015-11-21 DIAGNOSIS — H35033 Hypertensive retinopathy, bilateral: Secondary | ICD-10-CM | POA: Diagnosis not present

## 2015-11-21 DIAGNOSIS — H524 Presbyopia: Secondary | ICD-10-CM | POA: Diagnosis not present

## 2015-12-26 DIAGNOSIS — Z6824 Body mass index (BMI) 24.0-24.9, adult: Secondary | ICD-10-CM | POA: Diagnosis not present

## 2015-12-26 DIAGNOSIS — R300 Dysuria: Secondary | ICD-10-CM | POA: Diagnosis not present

## 2015-12-26 DIAGNOSIS — N08 Glomerular disorders in diseases classified elsewhere: Secondary | ICD-10-CM | POA: Diagnosis not present

## 2015-12-26 DIAGNOSIS — E1129 Type 2 diabetes mellitus with other diabetic kidney complication: Secondary | ICD-10-CM | POA: Diagnosis not present

## 2015-12-26 DIAGNOSIS — Z1389 Encounter for screening for other disorder: Secondary | ICD-10-CM | POA: Diagnosis not present

## 2016-03-15 DIAGNOSIS — L570 Actinic keratosis: Secondary | ICD-10-CM | POA: Diagnosis not present

## 2016-03-15 DIAGNOSIS — Z85828 Personal history of other malignant neoplasm of skin: Secondary | ICD-10-CM | POA: Diagnosis not present

## 2016-03-15 DIAGNOSIS — D485 Neoplasm of uncertain behavior of skin: Secondary | ICD-10-CM | POA: Diagnosis not present

## 2016-03-15 DIAGNOSIS — L821 Other seborrheic keratosis: Secondary | ICD-10-CM | POA: Diagnosis not present

## 2016-03-15 DIAGNOSIS — C44319 Basal cell carcinoma of skin of other parts of face: Secondary | ICD-10-CM | POA: Diagnosis not present

## 2016-03-15 DIAGNOSIS — D2239 Melanocytic nevi of other parts of face: Secondary | ICD-10-CM | POA: Diagnosis not present

## 2016-03-15 DIAGNOSIS — C4441 Basal cell carcinoma of skin of scalp and neck: Secondary | ICD-10-CM | POA: Diagnosis not present

## 2016-03-15 DIAGNOSIS — D0471 Carcinoma in situ of skin of right lower limb, including hip: Secondary | ICD-10-CM | POA: Diagnosis not present

## 2016-04-16 DIAGNOSIS — Z85828 Personal history of other malignant neoplasm of skin: Secondary | ICD-10-CM | POA: Diagnosis not present

## 2016-04-16 DIAGNOSIS — C4441 Basal cell carcinoma of skin of scalp and neck: Secondary | ICD-10-CM | POA: Diagnosis not present

## 2016-05-22 DIAGNOSIS — E1129 Type 2 diabetes mellitus with other diabetic kidney complication: Secondary | ICD-10-CM | POA: Diagnosis not present

## 2016-05-22 DIAGNOSIS — E559 Vitamin D deficiency, unspecified: Secondary | ICD-10-CM | POA: Diagnosis not present

## 2016-05-22 DIAGNOSIS — N08 Glomerular disorders in diseases classified elsewhere: Secondary | ICD-10-CM | POA: Diagnosis not present

## 2016-05-22 DIAGNOSIS — Z6825 Body mass index (BMI) 25.0-25.9, adult: Secondary | ICD-10-CM | POA: Diagnosis not present

## 2016-05-22 DIAGNOSIS — E038 Other specified hypothyroidism: Secondary | ICD-10-CM | POA: Diagnosis not present

## 2016-05-22 DIAGNOSIS — I6529 Occlusion and stenosis of unspecified carotid artery: Secondary | ICD-10-CM | POA: Diagnosis not present

## 2016-05-22 DIAGNOSIS — I10 Essential (primary) hypertension: Secondary | ICD-10-CM | POA: Diagnosis not present

## 2016-05-22 DIAGNOSIS — E784 Other hyperlipidemia: Secondary | ICD-10-CM | POA: Diagnosis not present

## 2016-05-22 DIAGNOSIS — K922 Gastrointestinal hemorrhage, unspecified: Secondary | ICD-10-CM | POA: Diagnosis not present

## 2016-05-22 DIAGNOSIS — I4891 Unspecified atrial fibrillation: Secondary | ICD-10-CM | POA: Diagnosis not present

## 2016-08-22 DIAGNOSIS — E038 Other specified hypothyroidism: Secondary | ICD-10-CM | POA: Diagnosis not present

## 2016-08-22 DIAGNOSIS — E1129 Type 2 diabetes mellitus with other diabetic kidney complication: Secondary | ICD-10-CM | POA: Diagnosis not present

## 2016-08-22 DIAGNOSIS — Z6825 Body mass index (BMI) 25.0-25.9, adult: Secondary | ICD-10-CM | POA: Diagnosis not present

## 2016-08-22 DIAGNOSIS — I4891 Unspecified atrial fibrillation: Secondary | ICD-10-CM | POA: Diagnosis not present

## 2016-08-22 DIAGNOSIS — E784 Other hyperlipidemia: Secondary | ICD-10-CM | POA: Diagnosis not present

## 2016-08-22 DIAGNOSIS — I10 Essential (primary) hypertension: Secondary | ICD-10-CM | POA: Diagnosis not present

## 2016-08-22 DIAGNOSIS — I638 Other cerebral infarction: Secondary | ICD-10-CM | POA: Diagnosis not present

## 2016-08-22 DIAGNOSIS — N08 Glomerular disorders in diseases classified elsewhere: Secondary | ICD-10-CM | POA: Diagnosis not present

## 2016-09-14 DIAGNOSIS — D225 Melanocytic nevi of trunk: Secondary | ICD-10-CM | POA: Diagnosis not present

## 2016-09-14 DIAGNOSIS — L570 Actinic keratosis: Secondary | ICD-10-CM | POA: Diagnosis not present

## 2016-09-14 DIAGNOSIS — L821 Other seborrheic keratosis: Secondary | ICD-10-CM | POA: Diagnosis not present

## 2016-09-14 DIAGNOSIS — C4441 Basal cell carcinoma of skin of scalp and neck: Secondary | ICD-10-CM | POA: Diagnosis not present

## 2016-09-14 DIAGNOSIS — C44319 Basal cell carcinoma of skin of other parts of face: Secondary | ICD-10-CM | POA: Diagnosis not present

## 2016-09-14 DIAGNOSIS — Z85828 Personal history of other malignant neoplasm of skin: Secondary | ICD-10-CM | POA: Diagnosis not present

## 2016-09-14 DIAGNOSIS — D485 Neoplasm of uncertain behavior of skin: Secondary | ICD-10-CM | POA: Diagnosis not present

## 2016-09-14 DIAGNOSIS — C4442 Squamous cell carcinoma of skin of scalp and neck: Secondary | ICD-10-CM | POA: Diagnosis not present

## 2016-10-15 DIAGNOSIS — C44319 Basal cell carcinoma of skin of other parts of face: Secondary | ICD-10-CM | POA: Diagnosis not present

## 2016-10-15 DIAGNOSIS — Z85828 Personal history of other malignant neoplasm of skin: Secondary | ICD-10-CM | POA: Diagnosis not present

## 2016-10-22 DIAGNOSIS — Z85828 Personal history of other malignant neoplasm of skin: Secondary | ICD-10-CM | POA: Diagnosis not present

## 2016-10-22 DIAGNOSIS — C4442 Squamous cell carcinoma of skin of scalp and neck: Secondary | ICD-10-CM | POA: Diagnosis not present

## 2016-10-29 DIAGNOSIS — Z4802 Encounter for removal of sutures: Secondary | ICD-10-CM | POA: Diagnosis not present

## 2016-11-22 DIAGNOSIS — H26491 Other secondary cataract, right eye: Secondary | ICD-10-CM | POA: Diagnosis not present

## 2016-11-22 DIAGNOSIS — E119 Type 2 diabetes mellitus without complications: Secondary | ICD-10-CM | POA: Diagnosis not present

## 2016-11-22 DIAGNOSIS — H524 Presbyopia: Secondary | ICD-10-CM | POA: Diagnosis not present

## 2016-11-22 DIAGNOSIS — H35033 Hypertensive retinopathy, bilateral: Secondary | ICD-10-CM | POA: Diagnosis not present

## 2016-11-22 DIAGNOSIS — H52223 Regular astigmatism, bilateral: Secondary | ICD-10-CM | POA: Diagnosis not present

## 2016-12-11 ENCOUNTER — Encounter (HOSPITAL_COMMUNITY): Payer: Self-pay | Admitting: Emergency Medicine

## 2016-12-11 ENCOUNTER — Encounter (HOSPITAL_COMMUNITY)
Admission: EM | Disposition: A | Discharge status: Home / Self Care | Payer: Self-pay | Source: Home / Self Care | Attending: Cardiology

## 2016-12-11 ENCOUNTER — Inpatient Hospital Stay (HOSPITAL_COMMUNITY)
Admission: EM | Admit: 2016-12-11 | Discharge: 2016-12-13 | DRG: 247 | Disposition: A | Discharge status: Home / Self Care | Payer: Medicare Other | Attending: Cardiology | Admitting: Cardiology

## 2016-12-11 DIAGNOSIS — I10 Essential (primary) hypertension: Secondary | ICD-10-CM | POA: Diagnosis not present

## 2016-12-11 DIAGNOSIS — E039 Hypothyroidism, unspecified: Secondary | ICD-10-CM | POA: Diagnosis present

## 2016-12-11 DIAGNOSIS — E785 Hyperlipidemia, unspecified: Secondary | ICD-10-CM | POA: Diagnosis not present

## 2016-12-11 DIAGNOSIS — I2510 Atherosclerotic heart disease of native coronary artery without angina pectoris: Secondary | ICD-10-CM | POA: Insufficient documentation

## 2016-12-11 DIAGNOSIS — E1169 Type 2 diabetes mellitus with other specified complication: Secondary | ICD-10-CM | POA: Diagnosis present

## 2016-12-11 DIAGNOSIS — Z8719 Personal history of other diseases of the digestive system: Secondary | ICD-10-CM | POA: Diagnosis not present

## 2016-12-11 DIAGNOSIS — I2109 ST elevation (STEMI) myocardial infarction involving other coronary artery of anterior wall: Secondary | ICD-10-CM

## 2016-12-11 DIAGNOSIS — E119 Type 2 diabetes mellitus without complications: Secondary | ICD-10-CM | POA: Diagnosis not present

## 2016-12-11 DIAGNOSIS — I2102 ST elevation (STEMI) myocardial infarction involving left anterior descending coronary artery: Secondary | ICD-10-CM

## 2016-12-11 DIAGNOSIS — Z955 Presence of coronary angioplasty implant and graft: Secondary | ICD-10-CM

## 2016-12-11 DIAGNOSIS — I480 Paroxysmal atrial fibrillation: Secondary | ICD-10-CM | POA: Diagnosis present

## 2016-12-11 DIAGNOSIS — I213 ST elevation (STEMI) myocardial infarction of unspecified site: Secondary | ICD-10-CM

## 2016-12-11 DIAGNOSIS — Z8673 Personal history of transient ischemic attack (TIA), and cerebral infarction without residual deficits: Secondary | ICD-10-CM | POA: Diagnosis not present

## 2016-12-11 DIAGNOSIS — I493 Ventricular premature depolarization: Secondary | ICD-10-CM | POA: Diagnosis present

## 2016-12-11 DIAGNOSIS — K219 Gastro-esophageal reflux disease without esophagitis: Secondary | ICD-10-CM | POA: Diagnosis present

## 2016-12-11 DIAGNOSIS — Z9861 Coronary angioplasty status: Principal | ICD-10-CM

## 2016-12-11 DIAGNOSIS — R0789 Other chest pain: Secondary | ICD-10-CM | POA: Diagnosis not present

## 2016-12-11 HISTORY — PX: LEFT HEART CATH AND CORONARY ANGIOGRAPHY: CATH118249

## 2016-12-11 HISTORY — DX: Paroxysmal atrial fibrillation: I48.0

## 2016-12-11 HISTORY — DX: ST elevation (STEMI) myocardial infarction involving left anterior descending coronary artery: I21.02

## 2016-12-11 HISTORY — DX: Gastrointestinal hemorrhage, unspecified: K92.2

## 2016-12-11 HISTORY — PX: CORONARY STENT INTERVENTION: CATH118234

## 2016-12-11 LAB — TROPONIN I: Troponin I: 0.47 ng/mL (ref <0.03)

## 2016-12-11 LAB — COMPREHENSIVE METABOLIC PANEL
ALT: 20 U/L (ref 17–63)
AST: 27 U/L (ref 15–41)
Albumin: 4 g/dL (ref 3.5–5.0)
Alkaline Phosphatase: 43 U/L (ref 38–126)
Anion gap: 13 (ref 5–15)
BUN: 21 mg/dL — ABNORMAL HIGH (ref 6–20)
CO2: 20 mmol/L — ABNORMAL LOW (ref 22–32)
Calcium: 9.7 mg/dL (ref 8.9–10.3)
Chloride: 106 mmol/L (ref 101–111)
Creatinine, Ser: 1.23 mg/dL (ref 0.61–1.24)
GFR calc Af Amer: 58 mL/min — ABNORMAL LOW (ref >60)
GFR calc non Af Amer: 50 mL/min — ABNORMAL LOW (ref >60)
Glucose, Bld: 332 mg/dL — ABNORMAL HIGH (ref 65–99)
Potassium: 4.1 mmol/L (ref 3.5–5.1)
Sodium: 139 mmol/L (ref 135–145)
Total Bilirubin: 0.8 mg/dL (ref 0.3–1.2)
Total Protein: 6.7 g/dL (ref 6.5–8.1)

## 2016-12-11 LAB — DIFFERENTIAL
Basophils Absolute: 0 10*3/uL (ref 0.0–0.1)
Basophils Relative: 0 %
Eosinophils Absolute: 0.1 10*3/uL (ref 0.0–0.7)
Eosinophils Relative: 1 %
Lymphocytes Relative: 23 %
Lymphs Abs: 2.6 10*3/uL (ref 0.7–4.0)
Monocytes Absolute: 1.1 10*3/uL — ABNORMAL HIGH (ref 0.1–1.0)
Monocytes Relative: 10 %
Neutro Abs: 7.6 10*3/uL (ref 1.7–7.7)
Neutrophils Relative %: 66 %

## 2016-12-11 LAB — CBC
HCT: 44 % (ref 39.0–52.0)
Hemoglobin: 15 g/dL (ref 13.0–17.0)
MCH: 31.8 pg (ref 26.0–34.0)
MCHC: 34.1 g/dL (ref 30.0–36.0)
MCV: 93.4 fL (ref 78.0–100.0)
Platelets: 249 10*3/uL (ref 150–400)
RBC: 4.71 MIL/uL (ref 4.22–5.81)
RDW: 13.7 % (ref 11.5–15.5)
WBC: 11.4 10*3/uL — ABNORMAL HIGH (ref 4.0–10.5)

## 2016-12-11 LAB — PROTIME-INR
INR: 1.01
Prothrombin Time: 13.3 seconds (ref 11.4–15.2)

## 2016-12-11 LAB — APTT: aPTT: 29 seconds (ref 24–36)

## 2016-12-11 LAB — LIPID PANEL
Cholesterol: 123 mg/dL (ref 0–200)
HDL: 29 mg/dL — ABNORMAL LOW (ref >40)
LDL Cholesterol: 19 mg/dL (ref 0–99)
Total CHOL/HDL Ratio: 4.2 RATIO
Triglycerides: 373 mg/dL — ABNORMAL HIGH (ref <150)
VLDL: 75 mg/dL — ABNORMAL HIGH (ref 0–40)

## 2016-12-11 SURGERY — LEFT HEART CATH AND CORONARY ANGIOGRAPHY
Anesthesia: LOCAL

## 2016-12-11 MED ORDER — HEPARIN SODIUM (PORCINE) 5000 UNIT/ML IJ SOLN
60.0000 [IU]/kg | INTRAMUSCULAR | Status: AC
  Administered 2016-12-11: 4000 [IU] via INTRAVENOUS

## 2016-12-11 MED ORDER — SODIUM CHLORIDE 0.9 % IV SOLN
INTRAVENOUS | Status: DC | PRN
  Administered 2016-12-11: 1.75 mg/kg/h via INTRAVENOUS

## 2016-12-11 MED ORDER — ONDANSETRON HCL 4 MG/2ML IJ SOLN: 4.0000 mg | Freq: Four times a day (QID) | INTRAMUSCULAR | Status: DC | PRN

## 2016-12-11 MED ORDER — LIDOCAINE HCL (PF) 1 % IJ SOLN
INTRAMUSCULAR | Status: AC
  Filled 2016-12-11: qty 30

## 2016-12-11 MED ORDER — LIDOCAINE HCL (PF) 1 % IJ SOLN
INTRAMUSCULAR | Status: DC | PRN
  Administered 2016-12-11: 2 mL

## 2016-12-11 MED ORDER — IOPAMIDOL (ISOVUE-370) INJECTION 76%
INTRAVENOUS | Status: AC
Start: 2016-12-11 — End: 2016-12-11
  Filled 2016-12-11: qty 50

## 2016-12-11 MED ORDER — ACETAMINOPHEN 325 MG PO TABS: 650.0000 mg | ORAL_TABLET | ORAL | Status: DC | PRN

## 2016-12-11 MED ORDER — SODIUM CHLORIDE 0.9% FLUSH
3.0000 mL | Freq: Two times a day (BID) | INTRAVENOUS | Status: DC
  Administered 2016-12-12 (×4): 3 mL via INTRAVENOUS

## 2016-12-11 MED ORDER — BIVALIRUDIN 250 MG IV SOLR
INTRAVENOUS | Status: AC
  Filled 2016-12-11: qty 250

## 2016-12-11 MED ORDER — SODIUM CHLORIDE 0.9 % IV SOLN
1.7500 mg/kg/h | Freq: Once | INTRAVENOUS | Status: AC
  Filled 2016-12-11: qty 250

## 2016-12-11 MED ORDER — INSULIN ASPART 100 UNIT/ML ~~LOC~~ SOLN
0.0000 [IU] | Freq: Three times a day (TID) | SUBCUTANEOUS | Status: DC
  Administered 2016-12-12 (×3): 3 [IU] via SUBCUTANEOUS
  Administered 2016-12-13: 2 [IU] via SUBCUTANEOUS

## 2016-12-11 MED ORDER — CLOPIDOGREL BISULFATE 300 MG PO TABS
ORAL_TABLET | ORAL | Status: AC
  Filled 2016-12-11: qty 2

## 2016-12-11 MED ORDER — HEPARIN (PORCINE) IN NACL 2-0.9 UNIT/ML-% IJ SOLN
INTRAMUSCULAR | Status: AC
  Filled 2016-12-11: qty 1000

## 2016-12-11 MED ORDER — ASPIRIN EC 81 MG PO TBEC: 81.0000 mg | DELAYED_RELEASE_TABLET | Freq: Every day | ORAL | Status: DC

## 2016-12-11 MED ORDER — CLOPIDOGREL BISULFATE 300 MG PO TABS
ORAL_TABLET | ORAL | Status: DC | PRN
  Administered 2016-12-11: 600 mg via ORAL

## 2016-12-11 MED ORDER — MIDAZOLAM HCL 2 MG/2ML IJ SOLN
INTRAMUSCULAR | Status: DC | PRN
  Administered 2016-12-11: 1 mg via INTRAVENOUS

## 2016-12-11 MED ORDER — LEVOTHYROXINE SODIUM 50 MCG PO TABS
50.0000 ug | ORAL_TABLET | Freq: Every day | ORAL | Status: DC
  Administered 2016-12-12 – 2016-12-13 (×2): 50 ug via ORAL
  Filled 2016-12-11 (×2): qty 1

## 2016-12-11 MED ORDER — SODIUM CHLORIDE 0.9 % IV SOLN
10.0000 mL/h | INTRAVENOUS | Status: DC
  Administered 2016-12-11: 75 mL/h via INTRAVENOUS

## 2016-12-11 MED ORDER — LABETALOL HCL 5 MG/ML IV SOLN: 10.0000 mg | INTRAVENOUS | Status: AC | PRN

## 2016-12-11 MED ORDER — BIVALIRUDIN BOLUS VIA INFUSION - CUPID
INTRAVENOUS | Status: DC | PRN
  Administered 2016-12-11: 57.825 mg via INTRAVENOUS

## 2016-12-11 MED ORDER — FENTANYL CITRATE (PF) 100 MCG/2ML IJ SOLN
INTRAMUSCULAR | Status: DC | PRN
  Administered 2016-12-11: 25 ug via INTRAVENOUS

## 2016-12-11 MED ORDER — SODIUM CHLORIDE 0.9% FLUSH: 3.0000 mL | INTRAVENOUS | Status: DC | PRN

## 2016-12-11 MED ORDER — FENTANYL CITRATE (PF) 100 MCG/2ML IJ SOLN
INTRAMUSCULAR | Status: AC
  Filled 2016-12-11: qty 2

## 2016-12-11 MED ORDER — SODIUM CHLORIDE 0.9 % IV SOLN: 250.0000 mL | INTRAVENOUS | Status: DC | PRN

## 2016-12-11 MED ORDER — PANTOPRAZOLE SODIUM 40 MG PO TBEC
40.0000 mg | DELAYED_RELEASE_TABLET | Freq: Every day | ORAL | Status: DC
  Administered 2016-12-12 – 2016-12-13 (×2): 40 mg via ORAL
  Filled 2016-12-11 (×2): qty 1

## 2016-12-11 MED ORDER — VERAPAMIL HCL 2.5 MG/ML IV SOLN
INTRAVENOUS | Status: DC | PRN
  Administered 2016-12-11: 10 mL via INTRA_ARTERIAL

## 2016-12-11 MED ORDER — IOPAMIDOL (ISOVUE-370) INJECTION 76%
INTRAVENOUS | Status: DC | PRN
  Administered 2016-12-11: 225 mL via INTRA_ARTERIAL

## 2016-12-11 MED ORDER — INSULIN ASPART 100 UNIT/ML ~~LOC~~ SOLN
0.0000 [IU] | Freq: Every day | SUBCUTANEOUS | Status: DC
  Administered 2016-12-12: 3 [IU] via SUBCUTANEOUS

## 2016-12-11 MED ORDER — METOPROLOL TARTRATE 12.5 MG HALF TABLET
12.5000 mg | ORAL_TABLET | Freq: Two times a day (BID) | ORAL | Status: DC
Start: 2016-12-11 — End: 2016-12-13
  Administered 2016-12-12 – 2016-12-13 (×4): 12.5 mg via ORAL
  Filled 2016-12-11 (×4): qty 1

## 2016-12-11 MED ORDER — CLOPIDOGREL BISULFATE 75 MG PO TABS: 75.0000 mg | ORAL_TABLET | Freq: Every day | ORAL | Status: DC

## 2016-12-11 MED ORDER — HEPARIN SODIUM (PORCINE) 5000 UNIT/ML IJ SOLN
5000.0000 [IU] | Freq: Three times a day (TID) | INTRAMUSCULAR | Status: DC
  Administered 2016-12-12 – 2016-12-13 (×4): 5000 [IU] via SUBCUTANEOUS
  Filled 2016-12-11 (×4): qty 1

## 2016-12-11 MED ORDER — SODIUM CHLORIDE 0.9 % IV SOLN
INTRAVENOUS | Status: AC
  Administered 2016-12-11: 22:00:00 via INTRAVENOUS

## 2016-12-11 MED ORDER — ROSUVASTATIN CALCIUM 10 MG PO TABS
20.0000 mg | ORAL_TABLET | Freq: Every morning | ORAL | Status: DC
Start: 2016-12-12 — End: 2016-12-13
  Administered 2016-12-12 – 2016-12-13 (×2): 20 mg via ORAL
  Filled 2016-12-11: qty 1
  Filled 2016-12-11: qty 2

## 2016-12-11 MED ORDER — NITROGLYCERIN 0.4 MG SL SUBL: 0.4000 mg | SUBLINGUAL_TABLET | SUBLINGUAL | Status: DC | PRN

## 2016-12-11 MED ORDER — ASPIRIN 81 MG PO CHEW
81.0000 mg | CHEWABLE_TABLET | Freq: Every day | ORAL | Status: DC
  Administered 2016-12-12 – 2016-12-13 (×2): 81 mg via ORAL
  Filled 2016-12-11 (×2): qty 1

## 2016-12-11 MED ORDER — IOPAMIDOL (ISOVUE-370) INJECTION 76%
INTRAVENOUS | Status: AC
  Filled 2016-12-11: qty 100

## 2016-12-11 MED ORDER — CLOPIDOGREL BISULFATE 75 MG PO TABS
75.0000 mg | ORAL_TABLET | Freq: Every day | ORAL | Status: DC
  Administered 2016-12-12 – 2016-12-13 (×2): 75 mg via ORAL
  Filled 2016-12-11 (×2): qty 1

## 2016-12-11 MED ORDER — MIDAZOLAM HCL 2 MG/2ML IJ SOLN
INTRAMUSCULAR | Status: AC
  Filled 2016-12-11: qty 2

## 2016-12-11 MED ORDER — HEPARIN (PORCINE) IN NACL 2-0.9 UNIT/ML-% IJ SOLN
INTRAMUSCULAR | Status: DC | PRN
Start: 2016-12-11 — End: 2016-12-11
  Administered 2016-12-11: 1000 mL

## 2016-12-11 MED ORDER — HYDRALAZINE HCL 20 MG/ML IJ SOLN: 5.0000 mg | INTRAMUSCULAR | Status: AC | PRN

## 2016-12-11 MED ORDER — VERAPAMIL HCL 2.5 MG/ML IV SOLN
INTRAVENOUS | Status: AC
Start: 2016-12-11 — End: 2016-12-11
  Filled 2016-12-11: qty 2

## 2016-12-11 SURGICAL SUPPLY — 20 items
BALLN EMERGE MR 2.5X12 (BALLOONS) ×2
BALLN ~~LOC~~ MOZEC 3.0X10 (BALLOONS) ×2
BALLOON EMERGE MR 2.5X12 (BALLOONS) IMPLANT
BALLOON ~~LOC~~ MOZEC 3.0X10 (BALLOONS) IMPLANT
CATH EXPO 5FR ANG PIGTAIL 145 (CATHETERS) ×1 IMPLANT
CATH INFINITI JR4 5F (CATHETERS) ×1 IMPLANT
CATH VISTA GUIDE 6FR JL3.5 SH (CATHETERS) ×1 IMPLANT
CATH VISTA GUIDE 6FR XBLAD3.5 (CATHETERS) ×1 IMPLANT
DEVICE RAD COMP TR BAND LRG (VASCULAR PRODUCTS) ×1 IMPLANT
GLIDESHEATH SLEND A-KIT 6F 22G (SHEATH) ×1 IMPLANT
GUIDEWIRE INQWIRE 1.5J.035X260 (WIRE) IMPLANT
INQWIRE 1.5J .035X260CM (WIRE) ×2
KIT ENCORE 26 ADVANTAGE (KITS) ×1 IMPLANT
KIT HEART LEFT (KITS) ×2 IMPLANT
PACK CARDIAC CATHETERIZATION (CUSTOM PROCEDURE TRAY) ×2 IMPLANT
STENT SYNERGY DES 2.75X16 (Permanent Stent) ×1 IMPLANT
SYR MEDRAD MARK V 150ML (SYRINGE) ×2 IMPLANT
TRANSDUCER W/STOPCOCK (MISCELLANEOUS) ×2 IMPLANT
TUBING CIL FLEX 10 FLL-RA (TUBING) ×2 IMPLANT
WIRE ASAHI PROWATER 180CM (WIRE) ×1 IMPLANT

## 2016-12-11 NOTE — ED Provider Notes (Signed)
[place] DEPT Provider Note   CSN: [id number] Arrival date & time: 12/11/16  2110     History   Chief Complaint Chief Complaint  Patient presents with  . Code STEMI    HPI [name] is a 83 y.o. male.  HPI  83 y.o male with hx of HTN, HL, DM, Stroke comes in with cc of chest pain. Pt reports having off and on chest pain all day today, which got worse prior to EMS arrival. Pt has no dib, nausea, sweating. Pt is having active 4/10 chest pain and occasionally he has had L arm pain.    Past Medical History:  Diagnosis Date  . Anemia   . Arthritis   . CVA (cerebral infarction)   . Diabetes mellitus without complication ([place])    Diet controlled  . GERD (gastroesophageal reflux disease)    hx of   . Hx of transfusion of packed red blood cells   . Hyperlipemia   . Hypertension   . Stroke [facility])    2011 no deficits   . Ulcer ([place])    stomach 2011 related to plavix     Patient Active Problem List   Diagnosis Date Noted  . Acute ST elevation myocardial infarction (STEMI) of anterior wall ([place]) 12/11/2016  . History of GI bleed 12/11/2016  . Dyslipidemia, goal LDL below 70 12/11/2016  . Hypothyroidism 12/11/2016  . STEMI (ST elevation myocardial infarction) ([place]) 12/11/2016  . Acute esophagitis 09/28/2013  . Fatty liver 09/28/2013  . Atrial fibrillation with RVR ([place]) 09/28/2013  . Elevated lipase 09/28/2013  . Essential hypertension 09/28/2013  . AKI (acute kidney injury) ([place]) 09/28/2013  . Controlled diabetes mellitus type II without complication ([place]) 03/30/1600  . Acute duodenal ulcer with bleeding 09/26/2013  . Pre-syncope 06/16/2013  . Acute blood loss anemia 04/29/2013  . Overweight (BMI 25.0-29.9) 04/29/2013  . S/P right THA, AA 04/28/2013  . History of stroke 12/12/2010    Past Surgical History:  Procedure Laterality Date  . CORONARY STENT INTERVENTION N/A 12/11/2016   Procedure: Coronary Stent Intervention-Mid LAD;  Surgeon: Leonie Man,  MD;  Location: [place] CV LAB;  Service: Cardiovascular;  Laterality: N/A;  . ESOPHAGOGASTRODUODENOSCOPY N/A 09/23/2013   Procedure: ESOPHAGOGASTRODUODENOSCOPY (EGD);  Surgeon: Lafayette Dragon, MD;  Location: [facility] ENDOSCOPY;  Service: Endoscopy;  Laterality: N/A;  . ESOPHAGOGASTRODUODENOSCOPY N/A 09/25/2013   Procedure: ESOPHAGOGASTRODUODENOSCOPY (EGD);  Surgeon: Beryle Beams, MD;  Location: [facility] ENDOSCOPY;  Service: Endoscopy;  Laterality: N/A;  . ESOPHAGOGASTRODUODENOSCOPY N/A 12/18/2013   Procedure: ESOPHAGOGASTRODUODENOSCOPY (EGD);  Surgeon: Beryle Beams, MD;  Location: [facility] ENDOSCOPY;  Service: Endoscopy;  Laterality: N/A;  . LEFT HEART CATH AND CORONARY ANGIOGRAPHY N/A 12/11/2016   Procedure: Left Heart Cath and Coronary Angiography;  Surgeon: Leonie Man, MD;  Location: [place] CV LAB;  Service: Cardiovascular;  Laterality: N/A;  . TOTAL HIP ARTHROPLASTY Right 04/28/2013   Procedure: RIGHT TOTAL HIP ARTHROPLASTY ANTERIOR APPROACH;  Surgeon: Mauri Pole, MD;  Location: [facility] ORS;  Service: Orthopedics;  Laterality: Right;       Home Medications    Prior to Admission medications   Medication Sig Start Date End Date Taking? Authorizing Provider  ergocalciferol (VITAMIN D2) 50000 UNITS capsule Take 50,000 Units by mouth once a week. Patient takes on Mondays   Yes Historical Provider, MD  levothyroxine (SYNTHROID, LEVOTHROID) 50 MCG tablet Take 50 mcg by mouth daily before breakfast.   Yes Historical Provider, MD  metoprolol tartrate (LOPRESSOR) 25  MG tablet Take 12.5 mg by mouth 2 (two) times daily.   Yes Historical Provider, MD  pantoprazole (PROTONIX) 40 MG tablet Take 40 mg by mouth daily.   Yes Historical Provider, MD  rosuvastatin (CRESTOR) 20 MG tablet Take 20 mg by mouth every morning.    Yes Historical Provider, MD    Family History No family history on file.  Social History Social History  Substance Use Topics  . Smoking status: Never Smoker  . Smokeless tobacco:  Never Used  . Alcohol use 1.8 oz/week    3 Shots of liquor per week     Comment: wine or vodka      Allergies   Adhesive [tape]   Review of Systems Review of Systems  Unable to perform ROS: Acuity of condition  Cardiovascular: Positive for chest pain.     Physical Exam Updated Vital Signs BP 100/65 (BP Location: Right Arm)   Pulse (!) 59   Temp 97.6 F (36.4 C) (Oral)   Resp 16   Ht 5\' 9"  (1.753 m)   Wt 168 lb 14 oz (76.6 kg)   SpO2 93%   BMI 24.94 kg/m   Physical Exam  Constitutional: He is oriented to person, place, and time. He appears well-developed.  HENT:  Head: Normocephalic and atraumatic.  Eyes: Conjunctivae and EOM are normal. Pupils are equal, round, and reactive to light.  Neck: Normal range of motion. Neck supple.  Cardiovascular: Normal rate, regular rhythm and intact distal pulses.   Pulmonary/Chest: Effort normal and breath sounds normal.  Abdominal: Soft. Bowel sounds are normal. He exhibits no distension and no mass. There is no tenderness. There is no rebound and no guarding.  Musculoskeletal: He exhibits no deformity.  Neurological: He is alert and oriented to person, place, and time.  Skin: Skin is warm.  Nursing note and vitals reviewed.    ED Treatments / Results  Labs (all labs ordered are listed, but only abnormal results are displayed) Labs Reviewed  CBC - Abnormal; Notable for the following:       Result Value   WBC 11.4 (*)    All other components within normal limits  DIFFERENTIAL - Abnormal; Notable for the following:    Monocytes Absolute 1.1 (*)    All other components within normal limits  COMPREHENSIVE METABOLIC PANEL - Abnormal; Notable for the following:    CO2 20 (*)    Glucose, Bld 332 (*)    BUN 21 (*)    GFR calc non Af Amer 50 (*)    GFR calc Af Amer 58 (*)    All other components within normal limits  TROPONIN I - Abnormal; Notable for the following:    Troponin I 0.47 (*)    All other components within  normal limits  LIPID PANEL - Abnormal; Notable for the following:    Triglycerides 373 (*)    HDL 29 (*)    VLDL 75 (*)    All other components within normal limits  BASIC METABOLIC PANEL - Abnormal; Notable for the following:    CO2 20 (*)    Glucose, Bld 236 (*)    GFR calc non Af Amer 57 (*)    All other components within normal limits  LIPID PANEL - Abnormal; Notable for the following:    Triglycerides 255 (*)    HDL 27 (*)    VLDL 51 (*)    All other components within normal limits  CBC - Abnormal; Notable for the following:  WBC 12.7 (*)    All other components within normal limits  TROPONIN I - Abnormal; Notable for the following:    Troponin I 2.98 (*)    All other components within normal limits  TROPONIN I - Abnormal; Notable for the following:    Troponin I 17.89 (*)    All other components within normal limits  GLUCOSE, CAPILLARY - Abnormal; Notable for the following:    Glucose-Capillary 257 (*)    All other components within normal limits  GLUCOSE, CAPILLARY - Abnormal; Notable for the following:    Glucose-Capillary 168 (*)    All other components within normal limits  GLUCOSE, CAPILLARY - Abnormal; Notable for the following:    Glucose-Capillary 173 (*)    All other components within normal limits  MRSA PCR SCREENING  PROTIME-INR  APTT  TROPONIN I  TROPONIN I  HEMOGLOBIN A1C    EKG  EKG Interpretation  Date/Time:  Tuesday December 11 2016 21:15:46 EDT Ventricular Rate:  81 PR Interval:    QRS Duration: 98 QT Interval:  416 QTC Calculation: 483 R Axis:   62 Text Interpretation:  Sinus rhythm Probable anterolateral infarct, acute ST elevation in anterior leads with depresison in inferior leads CATH LAB ACTIVATED Confirmed by Kathrynn Humble, MD, Thelma Comp ([id number]) on 12/11/2016 9:22:41 PM       Radiology No results found.  Procedures Procedures (including critical care time)  CRITICAL CARE Performed by: Varney Biles   Total critical care time: 33  minutes  Critical care time was exclusive of separately billable procedures and treating other patients.  Critical care was necessary to treat or prevent imminent or life-threatening deterioration.  Critical care was time spent personally by me on the following activities: development of treatment plan with patient and/or surrogate as well as nursing, discussions with consultants, evaluation of patient's response to treatment, examination of patient, obtaining history from patient or surrogate, ordering and performing treatments and interventions, ordering and review of laboratory studies, ordering and review of radiographic studies, pulse oximetry and re-evaluation of patient's condition.   Medications Ordered in ED Medications  levothyroxine (SYNTHROID, LEVOTHROID) tablet 50 mcg (50 mcg Oral Given 12/12/16 0857)  metoprolol tartrate (LOPRESSOR) tablet 12.5 mg (12.5 mg Oral Given 12/12/16 0900)  pantoprazole (PROTONIX) EC tablet 40 mg (40 mg Oral Given 12/12/16 0901)  rosuvastatin (CRESTOR) tablet 20 mg (20 mg Oral Given 12/12/16 0900)  nitroGLYCERIN (NITROSTAT) SL tablet 0.4 mg (not administered)  heparin injection 5,000 Units (5,000 Units Subcutaneous Given 12/12/16 1347)  insulin aspart (novoLOG) injection 0-15 Units (3 Units Subcutaneous Given 12/12/16 1347)  insulin aspart (novoLOG) injection 0-5 Units (3 Units Subcutaneous Given 12/12/16 0028)  labetalol (NORMODYNE,TRANDATE) injection 10 mg (not administered)  hydrALAZINE (APRESOLINE) injection 5 mg (not administered)  acetaminophen (TYLENOL) tablet 650 mg (not administered)  ondansetron (ZOFRAN) injection 4 mg (not administered)  0.9 %  sodium chloride infusion ( Intravenous Stopped 12/12/16 0858)  sodium chloride flush (NS) 0.9 % injection 3 mL (3 mLs Intravenous Given 12/12/16 0901)  sodium chloride flush (NS) 0.9 % injection 3 mL (not administered)  0.9 %  sodium chloride infusion (not administered)  aspirin chewable tablet 81 mg (81 mg  Oral Given 12/12/16 0900)  clopidogrel (PLAVIX) tablet 75 mg (75 mg Oral Given 12/12/16 0857)  heparin injection 60 Units/kg (4,000 Units Intravenous Given 12/11/16 2119)  bivalirudin (ANGIOMAX) 250 mg in sodium chloride 0.9 % 50 mL (5 mg/mL) infusion (0 mg/kg/hr  77.1 kg Intravenous Stopped 12/11/16 2330)  Initial Impression / Assessment and Plan / ED Course  I have reviewed the triage vital signs and the nursing notes.  Pertinent labs & imaging results that were available during my care of the patient were reviewed by me and considered in my medical decision making (see chart for details).      Pt is having a STEMI. Cath lab activated. ASA given by EMS and we gave heparin bolus. VSS and WNL.  Final Clinical Impressions(s) / ED Diagnoses   Final diagnoses:  Acute ST elevation myocardial infarction (STEMI), unspecified artery [facility])    New Prescriptions Current Discharge Medication List       Varney Biles, MD 12/12/16 [phone]

## 2016-12-11 NOTE — H&P (Signed)
CARDIOLOGY H&P  HPI: 83 y.o. male w/ history of CVA and severe GI bleeding presenting with STEMI.   Patient has no known history of coronary artery disease. Notes that he has had some psychosocial stressors over the past several days. On the day of presentation he noted waxing and waning substernal chest pressure, non-radiating with minimal associated diaphoresis. He had never experienced these symptoms before. His symptoms were unrelated to exertion or position. He took 4 baby aspirin at home and contacted EMS. A 12 lead ECG performed in the field revealed 47mm ST elevations in V2, V3, V4. A STEMI was called and the patient was brought to the ED.   On arrival to the ED, the patient reported 4/10 central non-radiating chest pressure. EMS noted that the patient had some episodes of bradycardia in the field w/ heart rates in the 40's. On arrival to the ED however the patient's vital signs were WNL. He was given a bolus of IV heparin. A repeat ECG revealed evolving/worsening ST elevations in the anterior precordial leads, thus the patient was taken to the cath lab for emergent intervention.   Notably the patient denies having had any episodes of hematochezia or melena since his hospitalization in 2014. He has however not taken any antiplatelet agents (including aspirin) or any systemic anticoagulation since this time.   Review of Systems:     Cardiac Review of Systems: {Y] = yes [ ]  = no  Chest Pain [ Y ]  Resting SOB [   ] Exertional SOB  [  ]  Orthopnea [  ]   Pedal Edema [   ]    Palpitations [  ] Syncope  [  ]   Presyncope [   ]  General Review of Systems: [Y] = yes [  ]=no Constitional: recent weight change [  ]; anorexia [  ]; fatigue [  ]; nausea [  ]; night sweats [  ]; fever [  ]; or chills [  ];                                                                     Dental: poor dentition[  ];   Eye : blurred vision [  ]; diplopia [   ]; vision changes [  ];  Amaurosis fugax[  ]; Resp: cough  [  ];  wheezing[  ];  hemoptysis[  ]; shortness of breath[  ]; paroxysmal nocturnal dyspnea[  ]; dyspnea on exertion[  ]; or orthopnea[  ];  GI:  gallstones[  ], vomiting[  ];  dysphagia[  ]; melena[  ];  hematochezia [  ]; heartburn[  ];   GU: kidney stones [  ]; hematuria[  ];   dysuria [  ];  nocturia[  ];               Skin: rash [  ], swelling[  ];, hair loss[  ];  peripheral edema[  ];  or itching[  ]; Musculosketetal: myalgias[  ];  joint swelling[  ];  joint erythema[  ];  joint pain[  ];  back pain[  ];  Heme/Lymph: bruising[  ];  bleeding[  ];  anemia[  ];  Neuro: TIA[  ];  headaches[  ];  stroke[  ];  vertigo[  ];  seizures[  ];   paresthesias[  ];  difficulty walking[  ];  Psych:depression[  ]; anxiety[  ];  Endocrine: diabetes[  ];  thyroid dysfunction[  ];  Other:  Past Medical History:  Diagnosis Date  . Anemia   . Arthritis   . CVA (cerebral infarction)   . Diabetes mellitus without complication ([place])    Diet controlled  . GERD (gastroesophageal reflux disease)    hx of   . Hx of transfusion of packed red blood cells   . Hyperlipemia   . Hypertension   . Stroke [facility])    2011 no deficits   . Ulcer ([place])    stomach 2011 related to plavix     Prior to Admission medications   Medication Sig Start Date End Date Taking? Authorizing Provider  ergocalciferol (VITAMIN D2) 50000 UNITS capsule Take 50,000 Units by mouth once a week. Patient takes on Mondays    Historical Provider, MD  levothyroxine (SYNTHROID, LEVOTHROID) 50 MCG tablet Take 50 mcg by mouth daily before breakfast.    Historical Provider, MD  metoprolol tartrate (LOPRESSOR) 25 MG tablet Take 12.5 mg by mouth 2 (two) times daily.    Historical Provider, MD  pantoprazole (PROTONIX) 40 MG tablet Take 40 mg by mouth daily.    Historical Provider, MD  rosuvastatin (CRESTOR) 20 MG tablet Take 20 mg by mouth every morning.     Historical Provider, MD    No Known Allergies  Social History   Social History  .  Marital status: Widowed    Spouse name: N/A  . Number of children: N/A  . Years of education: N/A   Occupational History  . Not on file.   Social History Main Topics  . Smoking status: Never Smoker  . Smokeless tobacco: Never Used  . Alcohol use 1.8 oz/week    3 Shots of liquor per week     Comment: wine or vodka   . Drug use: No  . Sexual activity: Not on file   Other Topics Concern  . Not on file   Social History Narrative  . No narrative on file    Family history noncontributory  PHYSICAL EXAM: Vitals:   12/11/16 2115 12/11/16 2117  BP: 134/86 134/86  Pulse: 82 79  Resp: 19 22  Temp:  97.6 F (36.4 C)   General:  Well appearing., minimal distress HEENT: normal Neck: supple. no JVD.  Cor: RRR, distand heart sounds, no r/m/g Lungs: CTAB, normal work of breathing Abdomen: soft, nontender, nondistended.  Extremities: no cyanosis, clubbing, rash, edema Neuro: alert & oriented x 3, cranial nerves grossly intact. moves all 4 extremities w/o difficulty. Affect pleasant.  ECG: NSR, HR 81, normal axis, normal intervals, >70mm ST elevations in the anterior precordial leads as well as <88mm ST elevation in lead 1; ST depressions in inferior leads; early R wave progression with R>>>S in V2 and V3 suggesting some degree of posterior infarct  No results found for this or any previous visit (from the past 24 hour(s)). No results found.   ASSESSMENT: 83 y.o. male w/ history of CVA and severe GI bleeding presenting with STEMI.   PLAN/DISCUSSION:  #) Anterior STEMI - echo ordered for AM - check lipids, A1c - ASA 81mg  daily - heparin drip to be d/c'd after PCI - home rosuvastatin, metoprolol - start ACE in AM pending renal function - SLN PRN - plavix 75mg  daily (loaded in cath lab)  #)  DM - hold home diabetes meds - ISS AC & HS for now  FULL CODE

## 2016-12-11 NOTE — ED Triage Notes (Signed)
Per [facility]: Pt to ED from home as Code STEMI - has had emotional stress this week and received bad news this morning about losing a loved one, began to have CP earlier today, went away until about 1.5 hours ago. At rest, substernal, no radiation, sinus rhythm at first with EMS and then bradycardia with bigeminy. Given 324 ASA and 2 NTG with relief. Denies SOB/N/V/diaphoresis. Pt A&O x 4, hx CVA 5 years ago, no cardiac hx.

## 2016-12-11 NOTE — Brief Op Note (Signed)
BRIEF CARDIAC CATH - PCI NOTE  12/11/2016  10:26 PM  PATIENT:  [name]  83 y.o. male with a distant history of possible atrial fibrillation and stroke with complication of anticoagulation having a GI bleed back in 2011. Since then he has done relatively well. He was seen by Dr. Kathlyn [place] in 2014 for near syncope, but then no further follow-up.  Apparently the patient had been under a significant amount of social stress over last several days and has begun to notice worsening exertional and resting chest pain. Tonight severe 8/10 chest pain did not go away. He called EMS and was found to have roughly 2-3 mm ST elevations in leads V2 and V3 with less pronounced ST elevations in V4 as well as I and aVL. Code STEMI was activated by EMS and shortly after arrival to the ER for lab draw an EKG, he was brought to the cardiac catheterization lab for emergent catheterization. Of note he was 4 out of 10 chest pain upon arrival to the ER where he was given IV heparin bolus. Upon arrival to the cath lab he was essentially chest pain-free.  Emergency consent implied  PRE-OPERATIVE DIAGNOSIS:  ANTERIOR STEMI  POST-OPERATIVE DIAGNOSIS:    Culprit lesion is 99% mid LAD stenosis - vessel PCI with Synergy DES 2.75 mm x 16 mm (3.0 mm)  Distal LAD has moderate lesions of 50 and 40%  Questionable significance Left Main 45-50% stenosis =  decision made to treat medically based on the patient's lack of symptoms prior to this event  50-60% ostial Circumflex disease.  Roughly well-preserved EF with normal EDP. Distal anterior-anteroapical hypokinesis.  PROCEDURE:  Procedure(s): Left Heart Cath and Coronary Angiography (N/A) Coronary Stent Intervention-Mid LAD (N/A)  SURGEON:  Surgeon(s) and Role:    * Leonie Man, MD - Primary  ANESTHESIA:   Subcutaneous lidocaine 3 ML. IV Versed 1 mg, IV fentanyl 25 g. - Sedation time 39 min, directly monitored.  EBL:   < 50 mL   CARDIAC  CATHETERIZATION/PCI:  6 French right radial sheath using micropuncture Kit -- iSTAT Chem panel checked with access.  5 [place] JR4 catheter for RCA angiography, 6 [place] XB LAD guide for initial LCA angiography   PCI: Exchanged for 6 [place] XB LAD 3.5 -- Programmer, applications  Predilation with 2.5 mm x 12 mm balloon  Stent: Synergy DES 2.75 mm x 16 mm   Post-dilation with 3.0 mm x 10 mm balloon (final diameter 3.0 mm)  Post PCI: 5 French angled pigtail catheter for left ventriculography. -- Catheter removed completely out of the body over wire without complication.  Radial sheath removed in the Cath Lab with TR band placed at 2025 hours, 11 mL   MEDICATIONS USED:    Plavix 600 mg by mouth  Angiomax bolus and drip => drip to be continued until current bag completed  Contrast: 225 mL  Radial cocktail 3 mg verapamil in 10 mL NS  DICTATION: .Note written in EPIC  PLAN OF CARE: Admit to inpatient  - based upon age, would not plan fast-track  ASA + Plavix x 3 month if possible, then d/c ASA  Continue home BB & Statin  PATIENT DISPOSITION:  ICU - extubated and stable.   Delay start of Pharmacological VTE agent (>24hrs) due to surgical blood loss or risk of bleeding: not applicable    Glenetta Hew, M.D., M.S. Interventional Cardiologist   Pager # [phone] Phone # [phone] [address] [place],  [place] [address]

## 2016-12-11 NOTE — ED Notes (Signed)
Janett Billow, RN accompanying patient up to the cath lab at this time.

## 2016-12-11 NOTE — ED Notes (Signed)
Radiolucent [place] pads placed on patient. Cath lab ready.

## 2016-12-12 ENCOUNTER — Encounter (HOSPITAL_COMMUNITY): Payer: Self-pay | Admitting: Cardiology

## 2016-12-12 ENCOUNTER — Inpatient Hospital Stay (HOSPITAL_COMMUNITY): Payer: Medicare Other

## 2016-12-12 DIAGNOSIS — I2109 ST elevation (STEMI) myocardial infarction involving other coronary artery of anterior wall: Secondary | ICD-10-CM

## 2016-12-12 HISTORY — PX: TRANSTHORACIC ECHOCARDIOGRAM: SHX275

## 2016-12-12 LAB — CBC
HCT: 41.8 % (ref 39.0–52.0)
Hemoglobin: 14 g/dL (ref 13.0–17.0)
MCH: 31.6 pg (ref 26.0–34.0)
MCHC: 33.5 g/dL (ref 30.0–36.0)
MCV: 94.4 fL (ref 78.0–100.0)
Platelets: 227 10*3/uL (ref 150–400)
RBC: 4.43 MIL/uL (ref 4.22–5.81)
RDW: 13.8 % (ref 11.5–15.5)
WBC: 12.7 10*3/uL — ABNORMAL HIGH (ref 4.0–10.5)

## 2016-12-12 LAB — BASIC METABOLIC PANEL
Anion gap: 9 (ref 5–15)
BUN: 17 mg/dL (ref 6–20)
CO2: 20 mmol/L — ABNORMAL LOW (ref 22–32)
Calcium: 9 mg/dL (ref 8.9–10.3)
Chloride: 109 mmol/L (ref 101–111)
Creatinine, Ser: 1.1 mg/dL (ref 0.61–1.24)
GFR calc Af Amer: >60 mL/min (ref >60)
GFR calc non Af Amer: 57 mL/min — ABNORMAL LOW (ref >60)
Glucose, Bld: 236 mg/dL — ABNORMAL HIGH (ref 65–99)
Potassium: 3.8 mmol/L (ref 3.5–5.1)
Sodium: 138 mmol/L (ref 135–145)

## 2016-12-12 LAB — GLUCOSE, CAPILLARY
Glucose-Capillary: 257 mg/dL — ABNORMAL HIGH (ref 65–99)
Glucose-Capillary: 168 mg/dL — ABNORMAL HIGH (ref 65–99)
Glucose-Capillary: 173 mg/dL — ABNORMAL HIGH (ref 65–99)
Glucose-Capillary: 161 mg/dL — ABNORMAL HIGH (ref 65–99)
Glucose-Capillary: 202 mg/dL — ABNORMAL HIGH (ref 65–99)
Glucose-Capillary: 162 mg/dL — ABNORMAL HIGH (ref 65–99)

## 2016-12-12 LAB — ECHOCARDIOGRAM COMPLETE
Height: 69 in
Weight: 2701.96 oz

## 2016-12-12 LAB — HEMOGLOBIN A1C
Hgb A1c MFr Bld: 9.7 % — ABNORMAL HIGH (ref 4.8–5.6)
Mean Plasma Glucose: 232 mg/dL

## 2016-12-12 LAB — TROPONIN I
Troponin I: 2.98 ng/mL (ref <0.03)
Troponin I: 17.89 ng/mL (ref <0.03)
Troponin I: 13.93 ng/mL (ref <0.03)
Troponin I: 10.67 ng/mL (ref <0.03)

## 2016-12-12 LAB — LIPID PANEL
Cholesterol: 100 mg/dL (ref 0–200)
HDL: 27 mg/dL — ABNORMAL LOW (ref >40)
LDL Cholesterol: 22 mg/dL (ref 0–99)
Total CHOL/HDL Ratio: 3.7 RATIO
Triglycerides: 255 mg/dL — ABNORMAL HIGH (ref <150)
VLDL: 51 mg/dL — ABNORMAL HIGH (ref 0–40)

## 2016-12-12 LAB — POCT ACTIVATED CLOTTING TIME: Activated Clotting Time: 593 seconds

## 2016-12-12 LAB — POCT I-STAT, CHEM 8
BUN: 24 mg/dL — ABNORMAL HIGH (ref 6–20)
Calcium, Ion: 1.17 mmol/L (ref 1.15–1.40)
Chloride: 108 mmol/L (ref 101–111)
Creatinine, Ser: 1.1 mg/dL (ref 0.61–1.24)
Glucose, Bld: 368 mg/dL — ABNORMAL HIGH (ref 65–99)
HCT: 41 % (ref 39.0–52.0)
Hemoglobin: 13.9 g/dL (ref 13.0–17.0)
Potassium: 4.1 mmol/L (ref 3.5–5.1)
Sodium: 140 mmol/L (ref 135–145)
TCO2: 18 mmol/L (ref 0–100)

## 2016-12-12 LAB — MRSA PCR SCREENING: MRSA by PCR: NEGATIVE

## 2016-12-12 NOTE — Progress Notes (Signed)
DAILY PROGRESS NOTE  Subjective:  No events overnight. Feels great today - very vibrant 83 yo. Single DES to the mid-LAD. Troponin peaked over 25. Echo pending.  Objective:  Temp:  [97.6 F (36.4 C)-98 F (36.7 C)] 97.9 F (36.6 C) (03/14 0836) Pulse Rate:  [0-84] 67 (03/14 0900) Resp:  [0-50] 16 (03/14 1000) BP: (118-171)/(63-98) 123/97 (03/14 0836) SpO2:  [0 %-100 %] 93 % (03/14 0900) Weight:  [168 lb 14 oz (76.6 kg)-170 lb (77.1 kg)] 168 lb 14 oz (76.6 kg) (03/13 2300) Weight change:   Intake/Output from previous day: 03/13 0701 - 03/14 0700 In: 715.2 [I.V.:715.2] Out: 1225 [Urine:1225]  Intake/Output from this shift: Total I/O In: 240 [P.O.:240] Out: 250 [Urine:250]  Medications: No current facility-administered medications on file prior to encounter.    Current Outpatient Prescriptions on File Prior to Encounter  Medication Sig Dispense Refill  . ergocalciferol (VITAMIN D2) 50000 UNITS capsule Take 50,000 Units by mouth once a week. Patient takes on Mondays    . levothyroxine (SYNTHROID, LEVOTHROID) 50 MCG tablet Take 50 mcg by mouth daily before breakfast.    . metoprolol tartrate (LOPRESSOR) 25 MG tablet Take 12.5 mg by mouth 2 (two) times daily.    . pantoprazole (PROTONIX) 40 MG tablet Take 40 mg by mouth daily.    . rosuvastatin (CRESTOR) 20 MG tablet Take 20 mg by mouth every morning.       Physical Exam: General appearance: alert and no distress Lungs: clear to auscultation bilaterally Heart: regular rate and rhythm, S1, S2 normal, no murmur, click, rub or gallop Extremities: extremities normal, atraumatic, no cyanosis or edema Neurologic: Grossly normal  Lab Results: Results for orders placed or performed during the hospital encounter of 12/11/16 (from the past 48 hour(s))  CBC     Status: Abnormal   Collection Time: 12/11/16  9:18 PM  Result Value Ref Range   WBC 11.4 (H) 4.0 - 10.5 K/uL   RBC 4.71 4.22 - 5.81 MIL/uL   Hemoglobin 15.0 13.0 -  17.0 g/dL   HCT 44.0 39.0 - 52.0 %   MCV 93.4 78.0 - 100.0 fL   MCH 31.8 26.0 - 34.0 pg   MCHC 34.1 30.0 - 36.0 g/dL   RDW 13.7 11.5 - 15.5 %   Platelets 249 150 - 400 K/uL  Differential     Status: Abnormal   Collection Time: 12/11/16  9:18 PM  Result Value Ref Range   Neutrophils Relative % 66 %   Neutro Abs 7.6 1.7 - 7.7 K/uL   Lymphocytes Relative 23 %   Lymphs Abs 2.6 0.7 - 4.0 K/uL   Monocytes Relative 10 %   Monocytes Absolute 1.1 (H) 0.1 - 1.0 K/uL   Eosinophils Relative 1 %   Eosinophils Absolute 0.1 0.0 - 0.7 K/uL   Basophils Relative 0 %   Basophils Absolute 0.0 0.0 - 0.1 K/uL  Protime-INR     Status: None   Collection Time: 12/11/16  9:18 PM  Result Value Ref Range   Prothrombin Time 13.3 11.4 - 15.2 seconds   INR 1.01   APTT     Status: None   Collection Time: 12/11/16  9:18 PM  Result Value Ref Range   aPTT 29 24 - 36 seconds  Comprehensive metabolic panel     Status: Abnormal   Collection Time: 12/11/16  9:18 PM  Result Value Ref Range   Sodium 139 135 - 145 mmol/L   Potassium 4.1 3.5 - 5.1  mmol/L   Chloride 106 101 - 111 mmol/L   CO2 20 (L) 22 - 32 mmol/L   Glucose, Bld 332 (H) 65 - 99 mg/dL   BUN 21 (H) 6 - 20 mg/dL   Creatinine, Ser 1.23 0.61 - 1.24 mg/dL   Calcium 9.7 8.9 - 10.3 mg/dL   Total Protein 6.7 6.5 - 8.1 g/dL   Albumin 4.0 3.5 - 5.0 g/dL   AST 27 15 - 41 U/L   ALT 20 17 - 63 U/L   Alkaline Phosphatase 43 38 - 126 U/L   Total Bilirubin 0.8 0.3 - 1.2 mg/dL   GFR calc non Af Amer 50 (L) >60 mL/min   GFR calc Af Amer 58 (L) >60 mL/min    Comment: (NOTE) The eGFR has been calculated using the CKD EPI equation. This calculation has not been validated in all clinical situations. eGFR's persistently <60 mL/min signify possible Chronic Kidney Disease.    Anion gap 13 5 - 15  Troponin I     Status: Abnormal   Collection Time: 12/11/16  9:18 PM  Result Value Ref Range   Troponin I 0.47 (HH) <0.03 ng/mL    Comment: CRITICAL RESULT CALLED TO,  READ BACK BY AND VERIFIED WITH: COUNCILMAN,K RN 12/11/2016 2223 JORDANS   Lipid panel     Status: Abnormal   Collection Time: 12/11/16  9:18 PM  Result Value Ref Range   Cholesterol 123 0 - 200 mg/dL   Triglycerides 373 (H) <150 mg/dL   HDL 29 (L) >40 mg/dL   Total CHOL/HDL Ratio 4.2 RATIO   VLDL 75 (H) 0 - 40 mg/dL   LDL Cholesterol 19 0 - 99 mg/dL    Comment:        Total Cholesterol/HDL:CHD Risk Coronary Heart Disease Risk Table                     Men   Women  1/2 Average Risk   3.4   3.3  Average Risk       5.0   4.4  2 X Average Risk   9.6   7.1  3 X Average Risk  23.4   11.0        Use the calculated Patient Ratio above and the CHD Risk Table to determine the patient's CHD Risk.        ATP III CLASSIFICATION (LDL):  <100     mg/dL   Optimal  100-129  mg/dL   Near or Above                    Optimal  130-159  mg/dL   Borderline  160-189  mg/dL   High  >190     mg/dL   Very High   MRSA PCR Screening     Status: None   Collection Time: 12/11/16 11:03 PM  Result Value Ref Range   MRSA by PCR NEGATIVE NEGATIVE    Comment:        The GeneXpert MRSA Assay (FDA approved for NASAL specimens only), is one component of a comprehensive MRSA colonization surveillance program. It is not intended to diagnose MRSA infection nor to guide or monitor treatment for MRSA infections.   Troponin I (serum)     Status: Abnormal   Collection Time: 12/11/16 11:50 PM  Result Value Ref Range   Troponin I 2.98 (HH) <0.03 ng/mL    Comment: CRITICAL RESULT CALLED TO, READ BACK BY AND VERIFIED WITH: ABO,M RN  12/12/2016 0121 JORDANS   Glucose, capillary     Status: Abnormal   Collection Time: 12/12/16 12:27 AM  Result Value Ref Range   Glucose-Capillary 257 (H) 65 - 99 mg/dL  Glucose, capillary     Status: Abnormal   Collection Time: 12/12/16  8:38 AM  Result Value Ref Range   Glucose-Capillary 168 (H) 65 - 99 mg/dL  Basic metabolic panel     Status: Abnormal   Collection Time:  12/12/16  9:49 AM  Result Value Ref Range   Sodium 138 135 - 145 mmol/L   Potassium 3.8 3.5 - 5.1 mmol/L   Chloride 109 101 - 111 mmol/L   CO2 20 (L) 22 - 32 mmol/L   Glucose, Bld 236 (H) 65 - 99 mg/dL   BUN 17 6 - 20 mg/dL   Creatinine, Ser 1.10 0.61 - 1.24 mg/dL   Calcium 9.0 8.9 - 10.3 mg/dL   GFR calc non Af Amer 57 (L) >60 mL/min   GFR calc Af Amer >60 >60 mL/min    Comment: (NOTE) The eGFR has been calculated using the CKD EPI equation. This calculation has not been validated in all clinical situations. eGFR's persistently <60 mL/min signify possible Chronic Kidney Disease.    Anion gap 9 5 - 15  Lipid panel     Status: Abnormal   Collection Time: 12/12/16  9:49 AM  Result Value Ref Range   Cholesterol 100 0 - 200 mg/dL   Triglycerides 255 (H) <150 mg/dL   HDL 27 (L) >40 mg/dL   Total CHOL/HDL Ratio 3.7 RATIO   VLDL 51 (H) 0 - 40 mg/dL   LDL Cholesterol 22 0 - 99 mg/dL    Comment:        Total Cholesterol/HDL:CHD Risk Coronary Heart Disease Risk Table                     Men   Women  1/2 Average Risk   3.4   3.3  Average Risk       5.0   4.4  2 X Average Risk   9.6   7.1  3 X Average Risk  23.4   11.0        Use the calculated Patient Ratio above and the CHD Risk Table to determine the patient's CHD Risk.        ATP III CLASSIFICATION (LDL):  <100     mg/dL   Optimal  100-129  mg/dL   Near or Above                    Optimal  130-159  mg/dL   Borderline  160-189  mg/dL   High  >190     mg/dL   Very High   CBC     Status: Abnormal   Collection Time: 12/12/16  9:49 AM  Result Value Ref Range   WBC 12.7 (H) 4.0 - 10.5 K/uL   RBC 4.43 4.22 - 5.81 MIL/uL   Hemoglobin 14.0 13.0 - 17.0 g/dL   HCT 41.8 39.0 - 52.0 %   MCV 94.4 78.0 - 100.0 fL   MCH 31.6 26.0 - 34.0 pg   MCHC 33.5 30.0 - 36.0 g/dL   RDW 13.8 11.5 - 15.5 %   Platelets 227 150 - 400 K/uL  Troponin I (serum)     Status: Abnormal   Collection Time: 12/12/16  9:49 AM  Result Value Ref Range    Troponin I 17.89 (HH) <  0.03 ng/mL    Comment: CRITICAL RESULT CALLED TO, READ BACK BY AND VERIFIED WITH: H KSOR,RN [id number] WILDERK     Imaging: No results found.  Assessment:  1. Principal Problem: 2.   Acute ST elevation myocardial infarction (STEMI) of anterior wall (HCC) 3. Active Problems: 4.   Essential hypertension 5.   Controlled diabetes mellitus type II without complication ([place]) 6.   History of stroke 7.   History of GI bleed 8.   Dyslipidemia, goal LDL below 70 9.   Hypothyroidism 10.   STEMI (ST elevation myocardial infarction) ([place]) 11.   Plan:  1. Acute LAD STEMI s/p DES - looks and feels good today. Echo pending. Has primarily low HDL and elevated trigs on crestor 20 mg daily. Continue BB, ASA and Plavix. PT evaluation today for return to independent living at [facility] or need for assisted living or short-term rehab. Anticipate possible d/c in am tomorrow.  Time Spent Directly with Patient:  15 minutes  Length of Stay:  LOS: 1 day   Pixie Casino, MD, [facility] Attending Cardiologist [place] 12/12/2016, 11:40 AM

## 2016-12-12 NOTE — Progress Notes (Signed)
CARDIAC REHAB PHASE I   PRE:  Rate/Rhythm: 8 SR  BP:  Sitting: 110/60        SaO2: 98 RA  MODE:  Ambulation: 390 ft   POST:  Rate/Rhythm: 72 SR  BP:  Sitting: 127/68         SaO2: 98 Ra  Pt ambulated 390 ft on RA, rolling walker, handheld assist, steady gait, tolerated well with no complaints.  Completed MI/stent education.  Reviewed risk factors, MI book, anti-platelet therapy, stent card, activity restrictions, ntg, exercise, heart healthy diet, carb counting, and phase 2 cardiac rehab. Pt verbalized understanding. Pt agrees to phase 2 cardiac rehab referral, will send to [facility] per pt request. Pt to bed per pt request after walk, call bell within reach. Will follow up tomorrow.   [place], RN, BSN 12/12/2016 3:25 PM

## 2016-12-12 NOTE — Progress Notes (Signed)
  Echocardiogram 2D Echocardiogram has been performed.  [name] 12/12/2016, 1:53 PM

## 2016-12-12 NOTE — Progress Notes (Signed)
12 Lead EKG results placed in patient's paper chart.

## 2016-12-12 NOTE — Plan of Care (Signed)
Problem: Activity: Goal: Ability to return to baseline activity level will improve Outcome: Completed/Met Date Met: 12/12/16 Ambulating around unit multiple times today using walker, minimal assistance

## 2016-12-12 NOTE — Evaluation (Signed)
Physical Therapy Evaluation Patient Details Name: THADD APUZZO MRN: [id number] DOB: 08/02/1924 Today's Date: 12/12/2016   History of Present Illness  83 year old gentleman presenting with anterior STEMI taking emergently to the cardiac catheterization lab. Acute LAD STEMI s/p DE.  Clinical Impression  Patient seen for mobility assessment, mobilizing well with good activity tolerance and steady ambulation with use of RW. Patient appears close to his baseline mobility, do not feel patient required further acute PT services at this time, encourage continued mobility with CR and nursing. Will sign off.    Follow Up Recommendations No PT follow up    Equipment Recommendations  None recommended by PT    Recommendations for Other Services       Precautions / Restrictions Precautions Precaution Comments: watch HR Restrictions Weight Bearing Restrictions: No      Mobility  Bed Mobility Overal bed mobility: Independent                Transfers Overall transfer level: Modified independent Equipment used: Rolling walker (2 wheeled)             General transfer comment: Increased time to come to upright, but no physical assist required  Ambulation/Gait Ambulation/Gait assistance: Modified independent (Device/Increase time) Ambulation Distance (Feet): 360 Feet Assistive device: Rolling walker (2 wheeled) Gait Pattern/deviations: WFL(Within Functional Limits)     General Gait Details: steady with ambulation  Stairs            Wheelchair Mobility    Modified Rankin (Stroke Patients Only)       Balance Overall balance assessment: Modified Independent                                           Pertinent Vitals/Pain Pain Assessment: No/denies pain    Home Living Family/patient expects to be discharged to:: Private residence (Independent living at [facility]) Living Arrangements: Alone Available Help at Discharge: Family Type of Home:  Independent living facility Home Access: Level entry     Home Layout: One level Home Equipment: Environmental consultant - 2 wheels;Cane - single point;Grab bars - tub/shower;Grab bars - toilet;Shower seat - built in      Prior Function Level of Independence: Independent with assistive device(s)               Hand Dominance   Dominant Hand: Right    Extremity/Trunk Assessment   Upper Extremity Assessment Upper Extremity Assessment: Overall WFL for tasks assessed    Lower Extremity Assessment Lower Extremity Assessment: Overall WFL for tasks assessed       Communication   Communication: No difficulties  Cognition Arousal/Alertness: Awake/alert Behavior During Therapy: WFL for tasks assessed/performed Overall Cognitive Status: Within Functional Limits for tasks assessed                 General Comments: w    General Comments      Exercises     Assessment/Plan    PT Assessment Patent does not need any further PT services  PT Problem List         PT Treatment Interventions      PT Goals (Current goals can be found in the Care Plan section)  Acute Rehab PT Goals Patient Stated Goal: to go home PT Goal Formulation: All assessment and education complete, DC therapy    Frequency     Barriers to discharge  Co-evaluation               End of Session Equipment Utilized During Treatment: Gait belt Activity Tolerance: Patient tolerated treatment well Patient left:  (in w/c to transfer to new unit) Nurse Communication: Mobility status PT Visit Diagnosis: Difficulty in walking, not elsewhere classified (R26.2)         Time: 7793-9030 PT Time Calculation (min) (ACUTE ONLY): 16 min   Charges:   PT Evaluation $PT Eval Moderate Complexity: 1 Procedure     PT G Codes:         Duncan Dull 12/21/2016, 5:15 PM Alben Deeds, [place] DPT  [phone]

## 2016-12-12 NOTE — Significant Event (Signed)
Patient ambulated around unit prior to taken to new room. Family  (daughters) updated on new room number. Patient taken to wheelchair to new room. Personal belongings (magazines, newspapers) taken to new room in 3W15. Staff in room prior to RN leaving.   [name]

## 2016-12-13 ENCOUNTER — Telehealth: Payer: Self-pay

## 2016-12-13 ENCOUNTER — Telehealth: Payer: Self-pay | Admitting: *Deleted

## 2016-12-13 ENCOUNTER — Encounter (HOSPITAL_COMMUNITY): Payer: Self-pay | Admitting: Physician Assistant

## 2016-12-13 LAB — GLUCOSE, CAPILLARY
Glucose-Capillary: 148 mg/dL — ABNORMAL HIGH (ref 65–99)
Glucose-Capillary: 219 mg/dL — ABNORMAL HIGH (ref 65–99)

## 2016-12-13 MED ORDER — CLOPIDOGREL BISULFATE 75 MG PO TABS: 75.0000 mg | ORAL_TABLET | Freq: Every day | ORAL | 4 refills | Status: DC

## 2016-12-13 MED ORDER — PANTOPRAZOLE SODIUM 40 MG PO TBEC
40.0000 mg | DELAYED_RELEASE_TABLET | Freq: Every day | ORAL | 3 refills | Status: DC
Start: 2016-12-13 — End: 2017-12-09

## 2016-12-13 MED ORDER — NITROGLYCERIN 0.4 MG SL SUBL: 0.4000 mg | SUBLINGUAL_TABLET | SUBLINGUAL | 12 refills | Status: AC | PRN

## 2016-12-13 MED ORDER — ASPIRIN 81 MG PO CHEW: 81.0000 mg | CHEWABLE_TABLET | Freq: Every day | ORAL | Status: DC

## 2016-12-13 NOTE — Plan of Care (Signed)
Problem: Activity: Goal: Risk for activity intolerance will decrease Outcome: Completed/Met Date Met: 12/13/16 Patient is able to ambulate around unit at his baseline level without chest pain, shortness of breath, or fatigue. Has worked with PT and cardiac rehab.

## 2016-12-13 NOTE — Telephone Encounter (Signed)
-----   Message from Eileen Stanford, PA-C sent at 12/13/2016 10:46 AM EDT ----- Needs TOC call for appointment with Almyra Deforest. Thanks!

## 2016-12-13 NOTE — Progress Notes (Signed)
CARDIAC REHAB PHASE I   PRE:  Rate/Rhythm:     BP: sitting 130/57    SaO2:   MODE:  Ambulation: 500 ft   POST:  Rate/Rhythm:     BP: sitting 146/62     SaO2:   Pt ready for d/c however had a few diet questions and wanted to walk. Steady with RW, no c/o, walked entire hall. Has RW at home. All questions answered, no c/o. [phone]   [place], ACSM 12/13/2016 12:09 PM

## 2016-12-13 NOTE — Discharge Summary (Signed)
Discharge Summary    Patient ID: [name],  MRN: [id number], DOB/AGE: 03-15-1924 83 y.o.  Admit date: 12/11/2016 Discharge date: 12/13/2016  Primary Care Provider: [name] Primary Cardiologist: Dr. [name]   Discharge Diagnoses    Principal Problem:   Acute ST elevation myocardial infarction (STEMI) of anterior wall [facility]) Active Problems:   Essential hypertension   Controlled diabetes mellitus type II without complication ([place])   History of stroke   History of GI bleed   Dyslipidemia, goal LDL below 70   Hypothyroidism   Allergies Allergies  Allergen Reactions  . Adhesive [Tape] Rash    Zoll pads adhesive     History of Present Illness     [name] is a 83 y.o. male with a history of CVA, GI bleed, DMT2, PAF, HTN, and HLD who presented to [facility] on 12/11/16 with chest pain and found to have anterior STEMI.   He suffered from an ischemic stroke in 2012. There was mild nonobstructive carotid disease. He was admitted in 10/2013 for acute GI bleeding 2/2 duodenal ulcer. He did have afib with RVR during this admission, but not felt to be an [place] candidate given bleeding issues. His ASA/plavix were discontinued.   He was seen by Dr. [name] in 2014 for confusion/pre syncope. A monitor was placed that showed PVCs and PACs but no afib. Echocardiogram showed normal LV systolic function with no significant valvular abnormalities.  He was in his usual state of health until several days prior to admission when he started noticing waxing/waning substernal chest pressure in the setting psychosocial stressors. He took 4 baby aspirin at home and contacted EMS. A 12 lead ECG performed in the field revealed 20mm ST elevations in V2, V3, V4. A STEMI was called and the patient was brought to the ED. On arrival to the ED, the patient reported 4/10 central non-radiating chest pressure. EMS noted that the patient had some episodes of bradycardia in the field w/ heart rates in the  40's. On arrival to the ED however the patient's vital signs were WNL. He was given a bolus of IV heparin. A repeat ECG revealed evolving/worsening ST elevations in the anterior precordial leads, thus the patient was taken to the cath lab for emergent intervention.   Hospital Course     Consultants: none   Anterior STEMI: s/p DES to LAD. Troponin peaked >25. Continue ASA/plavix, statin and BB. Continue PPI given history of gastric ulcer with major GI bleeding. Plan is for ASA + Plavix x 3 month if possible, then d/c ASA. 2D ECHO showed normal LV function with no WMAs  HTN: BPs have been well controlled.   HLD: LDL excellent at 22. Continue Crestor 20mg  daily.   DMT2: uncontrolled. HgA1c 9.7. He is currently trying to manage this with diet and exercise. He is followed by Dr. Forde Dandy who is also an endocrinologist. I will forward my note to him as he will likely need to start taking medication for this.    The patient has had an uncomplicated hospital course and is recovering well. The radial catheter site is stable. He has been seen by Dr. Debara Pickett today and deemed ready for discharge home. All follow-up appointments have been scheduled. Discharge medications are listed below.  _____________  Discharge Vitals Blood pressure 103/68, pulse 63, temperature 97.6 F (36.4 C), temperature source Oral, resp. rate 18, height 5\' 9"  (1.753 m), weight 168 lb 6.4 oz (76.4 kg), SpO2 100 %.  Filed Weights  12/11/16 2154 12/11/16 2300 12/13/16 0539  Weight: 170 lb (77.1 kg) 168 lb 14 oz (76.6 kg) 168 lb 6.4 oz (76.4 kg)   Physical Exam: General appearance: alert and no distress Lungs: clear to auscultation bilaterally Heart: regular rate and rhythm, S1, S2 normal, no murmur, click, rub or gallop Extremities: extremities normal, atraumatic, no cyanosis or edema Neurologic: Grossly normal   Labs & Radiologic Studies     CBC  Recent Labs  12/11/16 2118 12/11/16 2144 12/12/16 0949  WBC 11.4*  --   12.7*  NEUTROABS 7.6  --   --   HGB 15.0 13.9 14.0  HCT 44.0 41.0 41.8  MCV 93.4  --  94.4  PLT 249  --  814   Basic Metabolic Panel  Recent Labs  12/11/16 2118 12/11/16 2144 12/12/16 0949  NA 139 140 138  K 4.1 4.1 3.8  CL 106 108 109  CO2 20*  --  20*  GLUCOSE 332* 368* 236*  BUN 21* 24* 17  CREATININE 1.23 1.10 1.10  CALCIUM 9.7  --  9.0   Liver Function Tests  Recent Labs  12/11/16 2118  AST 27  ALT 20  ALKPHOS 43  BILITOT 0.8  PROT 6.7  ALBUMIN 4.0   No results for input(s): LIPASE, AMYLASE in the last 72 hours. Cardiac Enzymes  Recent Labs  12/12/16 0949 12/12/16 1516 12/12/16 2147  TROPONINI 17.89* 13.93* 10.67*   BNP Invalid input(s): POCBNP D-Dimer No results for input(s): DDIMER in the last 72 hours. Hemoglobin A1C  Recent Labs  12/12/16 0949  HGBA1C 9.7*   Fasting Lipid Panel  Recent Labs  12/12/16 0949  CHOL 100  HDL 27*  LDLCALC 22  TRIG 255*  CHOLHDL 3.7   Thyroid Function Tests No results for input(s): TSH, T4TOTAL, T3FREE, THYROIDAB in the last 72 hours.  Invalid input(s): FREET3  No results found.   Diagnostic Studies/Procedures    12/11/16 Coronary Stent Intervention-Mid LAD  Left Heart Cath and Coronary Angiography  Conclusion    Mid LAD lesion, 99 %stenosed.  A STENT SYNERGY DES 2.75X16 drug eluting stent was successfully placed - postdilated to 3.0 mm  Post intervention, there is a 0% residual stenosis.  ___________________________________  LM lesion, 45 %stenosed. Questionable significance. In several views it looks to be significant. However as he is not had symptoms prior to this episode, likely not symptom limiting. Plan medical management  Ost Cx to Prox Cx lesion, 60 %stenosed. Ost 1st Mrg to 1st Mrg lesion, 60 %stenosed. - Medical managment  Dist LAD-2 lesion, 40 %stenosed. Dist LAD-1 lesion, 50 %stenosed. Ost 1st Diag to 1st Diag lesion, 50 %stenosed.  The left ventricular systolic function  is normal.  LV end diastolic pressure is normal.  The left ventricular ejection fraction is 50-55% by visual estimate.  There is trivial (1+) mitral regurgitation.  There is no aortic valve stenosis. There is no aortic valve regurgitation.   Successful PCI of the culprit lesion for his acute MI. One single DES stent was placed. Synergy was chosen because of his history of GI bleed on Plavix.  There is existing disease in the left main and ostial circumflex-OM1. These lesions do seem concerning, however in the lack of antecedent anginal symptoms, I felt that the best course of action was PCI on the LAD with medical management only of the lesions based on his age.  Plan: Admit to inpatient   ASA + Plavix x 3 month if possible, then d/c ASA  Continue home BB & Statin   Would check 2-D echocardiogram  Based upon age, would not plan fast-track    _____________  2D ECHO: 12/12/2016 LV EF: 60% -   65% Study Conclusions - Left ventricle: The cavity size was normal. Wall thickness was   normal. Systolic function was normal. The estimated ejection   fraction was in the range of 60% to 65%. Wall motion was normal;   there were no regional wall motion abnormalities. Features are   consistent with a pseudonormal left ventricular filling pattern,   with concomitant abnormal relaxation and increased filling   pressure (grade 2 diastolic dysfunction). - Left atrium: The atrium was mildly dilated.   Disposition   Pt is being discharged home today in good condition.  Follow-up Plans & Appointments    Follow-up Information    Almyra Deforest, [place] Follow up on 12/24/2016.   Specialties:  Cardiology, Radiology Why:  @ 11:30am for your follow up with Dr. Allison Quarry PA. Please arrive at least 10 minutes early Contact information: [address] [place] [address] [phone]          Discharge Instructions    AMB Referral to Cardiac Rehabilitation - Phase II     Complete by:  As directed    Diagnosis:   STEMI Coronary Stents     Amb Referral to Cardiac Rehabilitation    Complete by:  As directed    Diagnosis:   Coronary Stents Comment - To [place] STEMI        Discharge Medications     Medication List    TAKE these medications   aspirin 81 MG chewable tablet Chew 1 tablet (81 mg total) by mouth daily. Start taking on:  12/14/2016   clopidogrel 75 MG tablet Commonly known as:  PLAVIX Take 1 tablet (75 mg total) by mouth daily with breakfast. Start taking on:  12/14/2016   ergocalciferol 50000 units capsule Commonly known as:  VITAMIN D2 Take 50,000 Units by mouth once a week. Patient takes on Mondays   levothyroxine 50 MCG tablet Commonly known as:  SYNTHROID, LEVOTHROID Take 50 mcg by mouth daily before breakfast.   metoprolol tartrate 25 MG tablet Commonly known as:  LOPRESSOR Take 12.5 mg by mouth 2 (two) times daily.   nitroGLYCERIN 0.4 MG SL tablet Commonly known as:  NITROSTAT Place 1 tablet (0.4 mg total) under the tongue every 5 (five) minutes x 3 doses as needed for chest pain.   pantoprazole 40 MG tablet Commonly known as:  PROTONIX Take 1 tablet (40 mg total) by mouth daily.   rosuvastatin 20 MG tablet Commonly known as:  CRESTOR Take 20 mg by mouth every morning.       Aspirin prescribed at discharge?  Yes High Intensity Statin Prescribed? (Lipitor 40-80mg  or Crestor 20-40mg ): Yes Beta Blocker Prescribed? Yes For EF 45% or less, Was ACEI/ARB Prescribed? No: EF normal ADP Receptor Inhibitor Prescribed? (i.e. Plavix etc.-Includes Medically Managed Patients): Yes For EF <45%, Aldosterone Inhibitor Prescribed? No: EF normal Was EF assessed during THIS hospitalization? Yes Was Cardiac Rehab II ordered? (Included Medically managed Patients): Yes   Outstanding Labs/Studies   none  Duration of Discharge Encounter   Greater than 30 minutes including physician time.  Signed, Angelena Form  PA-C 12/13/2016, 10:33 AM

## 2016-12-13 NOTE — Discharge Instructions (Signed)

## 2016-12-14 NOTE — Consult Note (Signed)
           [facility] CM Primary Care Navigator  12/14/2016  [name] 02/17/1924 [id number]   [place] see patient earlier today at the bedside to identify possible discharge needs but staff reports that patient was already discharged home.  Primary care provider's office called Lattie Haw) to notify of patient's discharge and need for post hospital follow-up and transition of care. Notified of patient's present A1C (9.7) without medication. Made aware to refer patient to [facility] care management if deemed appropriate for services.  For questions, please contact:  Dannielle Huh, BSN, RN- [facility] Primary Care Navigator  Telephone: [phone] [place]

## 2016-12-14 NOTE — Telephone Encounter (Signed)
Patient contacted regarding discharge from [facility] on 12/13/16.  Patient understands to follow up with provider Almyra Deforest PA on 12/24/16 at 11:30 at [facility]. Patient understands discharge instructions? yes Patient understands medications and regiment? yes Patient understands to bring all medications to this visit? yes

## 2016-12-19 DIAGNOSIS — I2102 ST elevation (STEMI) myocardial infarction involving left anterior descending coronary artery: Secondary | ICD-10-CM | POA: Diagnosis not present

## 2016-12-19 DIAGNOSIS — E1165 Type 2 diabetes mellitus with hyperglycemia: Secondary | ICD-10-CM | POA: Diagnosis not present

## 2016-12-20 DIAGNOSIS — L988 Other specified disorders of the skin and subcutaneous tissue: Secondary | ICD-10-CM | POA: Diagnosis not present

## 2016-12-20 DIAGNOSIS — Z85828 Personal history of other malignant neoplasm of skin: Secondary | ICD-10-CM | POA: Diagnosis not present

## 2016-12-20 DIAGNOSIS — D485 Neoplasm of uncertain behavior of skin: Secondary | ICD-10-CM | POA: Diagnosis not present

## 2016-12-24 ENCOUNTER — Encounter: Payer: Self-pay | Admitting: Physician Assistant

## 2016-12-24 ENCOUNTER — Ambulatory Visit (INDEPENDENT_AMBULATORY_CARE_PROVIDER_SITE_OTHER): Payer: Medicare Other | Admitting: Physician Assistant

## 2016-12-24 VITALS — BP 140/82 | HR 104 | Ht 69.0 in | Wt 170.6 lb

## 2016-12-24 DIAGNOSIS — I480 Paroxysmal atrial fibrillation: Secondary | ICD-10-CM

## 2016-12-24 DIAGNOSIS — I2510 Atherosclerotic heart disease of native coronary artery without angina pectoris: Secondary | ICD-10-CM | POA: Diagnosis not present

## 2016-12-24 DIAGNOSIS — Z8673 Personal history of transient ischemic attack (TIA), and cerebral infarction without residual deficits: Secondary | ICD-10-CM | POA: Diagnosis not present

## 2016-12-24 DIAGNOSIS — Z8719 Personal history of other diseases of the digestive system: Secondary | ICD-10-CM

## 2016-12-24 DIAGNOSIS — R5383 Other fatigue: Secondary | ICD-10-CM | POA: Diagnosis not present

## 2016-12-24 DIAGNOSIS — E781 Pure hyperglyceridemia: Secondary | ICD-10-CM

## 2016-12-24 DIAGNOSIS — Z79899 Other long term (current) drug therapy: Secondary | ICD-10-CM | POA: Diagnosis not present

## 2016-12-24 LAB — CBC
HCT: 42.7 % (ref 38.5–50.0)
Hemoglobin: 13.8 g/dL (ref 13.2–17.1)
MCH: 31 pg (ref 27.0–33.0)
MCHC: 32.3 g/dL (ref 32.0–36.0)
MCV: 96 fL (ref 80.0–100.0)
MPV: 9.8 fL (ref 7.5–12.5)
Platelets: 307 10*3/uL (ref 140–400)
RBC: 4.45 MIL/uL (ref 4.20–5.80)
RDW: 13.9 % (ref 11.0–15.0)
WBC: 9.2 10*3/uL (ref 3.8–10.8)

## 2016-12-24 MED ORDER — METOPROLOL TARTRATE 25 MG PO TABS: 12.5000 mg | ORAL_TABLET | Freq: Two times a day (BID) | ORAL | 3 refills | Status: DC

## 2016-12-24 MED ORDER — OMEGA-3-ACID ETHYL ESTERS 1 G PO CAPS: 1.0000 g | ORAL_CAPSULE | Freq: Two times a day (BID) | ORAL | 3 refills | Status: DC

## 2016-12-24 NOTE — Patient Instructions (Signed)
Medication Instructions:  INCREASE METOPROLOL TO 25MG  TWICE DAILY START LOVAZA 1GRAM DAILY  If you need a refill on your cardiac medications before your next appointment, please call your pharmacy.  Labwork: CBC TODAY AT [facility] ON THE 1ST FLOOR FASTING LIPID PANEL IN 6 WEEKS (THE FIRST WEEK OF MAY)  Follow-Up: Your physician wants you to follow-up in: 2 MONTHS WITH DR HARDING.  Thank you for choosing [facility] at [place]!!    HAO MENG, PA-C [place], LPN

## 2016-12-24 NOTE — Progress Notes (Signed)
Cardiology Office Note    Date:  12/24/2016   ID:  [name], DOB [birth year], MRN [id number]  PCP:  [name], MD  Cardiologist:  Dr. [name] (previously Dr. [name] who he has not seen since 2014, after discussing with patient, he prefers to see Dr. [name] cathed him)  Chief Complaint  Patient presents with  . Transitions Of Care    2 week transition of care followup. Seen for Dr. [name]    History of Present Illness:  [name] is a 83 y.o. male with PMH of possible afib, GI bleed requiring blood transfusion, ischemic stroke 2012, carotid artery disease, HTN, diet controlled DM and recently diagnosed CAD. He had a history of major GI bleed that required 4 units of blood transfusion back in 2014. He has not been on any systemic anticoagulation or antiplatelet medication since. Prior to recent admission, he has no known history of coronary artery disease. He presented to the hospital on 12/11/2016 with anterior STEMI. Upon arrival, he continued to have 4 out of 10 central nonradiating chest pain. He underwent emergent cardiac catheterization right after arrival which showed 99% mid LAD lesion treated with 2.75 x 16 mm Synergy DES postdilated to 3.0 mm, 45% left main lesion, 60% ostial left circumflex lesion, 60% OM1 lesion, 40% distal LAD lesion, 50-55% EF. The left main lesion was of questionable significance, medical therapy is recommended for the left main lesion. After the stent, patient was placed on aspirin and Plavix with the recommendation of continued to want antiplatelet therapy for at least 3 months if possible, then discontinue aspirin. Echocardiogram obtained on 12/12/2016 showed EF 39-03%, grade 2 diastolic dysfunction.  Since discharge, he has not had any recurrent chest discomfort. He has been compliant with his medication. Unfortunately, his male partner who has been with him for many years has recently passed away. His dermatologist has taken a skin sample from  his scalp and has some bleeding afterward. Otherwise he has been doing well. His EKG today continued to show T-wave inversion in lead V1-V4. Discussing with the patient, he preferred to follow-up with Dr. [name] instead of Dr. [name] who have not seen him since 2014. He does complain of some fatigue, given his history of bleeding, I will check CBC. Recent laboratory findings obtained on 12/12/2016 also showed cholesterol 100, HDL 27, LDL 22, and triglyceride of 255. I have added fish oil to his medical regimen. His blood pressure is mildly elevated today, I have increased his metoprolol to 25 mg twice a day.   Past Medical History:  Diagnosis Date  . Anemia   . Arthritis   . CAD (coronary artery disease)    a. 11/2016: anterior STEMI s/p DES to LAD.   . Diabetes mellitus without complication ([place])    Diet controlled  . GERD (gastroesophageal reflux disease)   . GI bleed    10/2013: 2/2 duodenal ulcer. ASA/plavix discontinued.   . Hyperlipemia   . Hypertension   . PAF (paroxysmal atrial fibrillation) ([place])    a. noted during admission in 10/2013 for acute GI bleed. No recurrunce since. Monitor in 2014 with only PAC/PVCs  . Stroke [facility])    a. ichemic CVA in 2012.     Past Surgical History:  Procedure Laterality Date  . CORONARY STENT INTERVENTION N/A 12/11/2016   Procedure: Coronary Stent Intervention-Mid LAD;  Surgeon: Leonie Man, MD;  Location: [place] [facility] CV LAB;  Service: Cardiovascular;  Laterality: N/A;  . ESOPHAGOGASTRODUODENOSCOPY N/A 09/23/2013  Procedure: ESOPHAGOGASTRODUODENOSCOPY (EGD);  Surgeon: Lafayette Dragon, MD;  Location: [facility] ENDOSCOPY;  Service: Endoscopy;  Laterality: N/A;  . ESOPHAGOGASTRODUODENOSCOPY N/A 09/25/2013   Procedure: ESOPHAGOGASTRODUODENOSCOPY (EGD);  Surgeon: Beryle Beams, MD;  Location: [facility] ENDOSCOPY;  Service: Endoscopy;  Laterality: N/A;  . ESOPHAGOGASTRODUODENOSCOPY N/A 12/18/2013   Procedure: ESOPHAGOGASTRODUODENOSCOPY (EGD);  Surgeon: Beryle Beams,  MD;  Location: [facility] ENDOSCOPY;  Service: Endoscopy;  Laterality: N/A;  . LEFT HEART CATH AND CORONARY ANGIOGRAPHY N/A 12/11/2016   Procedure: Left Heart Cath and Coronary Angiography;  Surgeon: Leonie Man, MD;  Location: [place] CV LAB;  Service: Cardiovascular;  Laterality: N/A;  . TOTAL HIP ARTHROPLASTY Right 04/28/2013   Procedure: RIGHT TOTAL HIP ARTHROPLASTY ANTERIOR APPROACH;  Surgeon: Mauri Pole, MD;  Location: [facility] ORS;  Service: Orthopedics;  Laterality: Right;    Current Medications: Outpatient Medications Prior to Visit  Medication Sig Dispense Refill  . aspirin 81 MG chewable tablet Chew 1 tablet (81 mg total) by mouth daily.    . clopidogrel (PLAVIX) 75 MG tablet Take 1 tablet (75 mg total) by mouth daily with breakfast. 90 tablet 4  . ergocalciferol (VITAMIN D2) 50000 UNITS capsule Take 50,000 Units by mouth once a week. Patient takes on Mondays    . levothyroxine (SYNTHROID, LEVOTHROID) 50 MCG tablet Take 50 mcg by mouth daily before breakfast.    . nitroGLYCERIN (NITROSTAT) 0.4 MG SL tablet Place 1 tablet (0.4 mg total) under the tongue every 5 (five) minutes x 3 doses as needed for chest pain. 25 tablet 12  . pantoprazole (PROTONIX) 40 MG tablet Take 1 tablet (40 mg total) by mouth daily. 90 tablet 3  . rosuvastatin (CRESTOR) 20 MG tablet Take 20 mg by mouth every morning.     . metoprolol tartrate (LOPRESSOR) 25 MG tablet Take 12.5 mg by mouth 2 (two) times daily.     No facility-administered medications prior to visit.      Allergies:   Adhesive [tape]   Social History   Social History  . Marital status: Widowed    Spouse name: N/A  . Number of children: N/A  . Years of education: N/A   Social History Main Topics  . Smoking status: Never Smoker  . Smokeless tobacco: Never Used  . Alcohol use 1.8 oz/week    3 Shots of liquor per week     Comment: wine or vodka   . Drug use: No  . Sexual activity: Not Asked   Other Topics Concern  . None   Social  History Narrative  . None     Family History:  The patient's family history is not on file.   ROS:   Please see the history of present illness.    ROS All other systems reviewed and are negative.   PHYSICAL EXAM:   VS:  BP 140/82 (BP Location: Right Arm, Patient Position: Sitting, Cuff Size: Normal)   Pulse (!) 104   Ht 5\' 9"  (1.753 m)   Wt 170 lb 9.6 oz (77.4 kg)   BMI 25.19 kg/m    GEN: Well nourished, well developed, in no acute distress  HEENT: normal  Neck: no JVD, carotid bruits, or masses Cardiac: RRR; no murmurs, rubs, or gallops,no edema  Respiratory:  clear to auscultation bilaterally, normal work of breathing GI: soft, nontender, nondistended, + BS MS: no deformity or atrophy  Skin: warm and dry, no rash Neuro:  Alert and Oriented x 3, Strength and sensation are intact Psych: euthymic mood,  full affect  Wt Readings from Last 3 Encounters:  12/24/16 170 lb 9.6 oz (77.4 kg)  12/13/16 168 lb 6.4 oz (76.4 kg)  04/03/15 170 lb (77.1 kg)      Studies/Labs Reviewed:   EKG:  EKG is ordered today.  The ekg ordered today demonstrates Normal sinus rhythm, single PVC, no significant ST elevation, however does have persistent T wave inversion in lead V1 through V4.  Recent Labs: 12/11/2016: ALT 20 12/12/2016: BUN 17; Creatinine, Ser 1.10; Hemoglobin 14.0; Platelets 227; Potassium 3.8; Sodium 138   Lipid Panel    Component Value Date/Time   CHOL 100 12/12/2016 0949   TRIG 255 (H) 12/12/2016 0949   HDL 27 (L) 12/12/2016 0949   CHOLHDL 3.7 12/12/2016 0949   VLDL 51 (H) 12/12/2016 0949   LDLCALC 22 12/12/2016 0949    Additional studies/ records that were reviewed today include:   Cath 12/11/2016 Conclusion     Mid LAD lesion, 99 %stenosed.  A STENT SYNERGY DES 2.75X16 drug eluting stent was successfully placed - postdilated to 3.0 mm  Post intervention, there is a 0% residual stenosis.  ___________________________________  LM lesion, 45 %stenosed.  Questionable significance. In several views it looks to be significant. However as he is not had symptoms prior to this episode, likely not symptom limiting. Plan medical management  Ost Cx to Prox Cx lesion, 60 %stenosed. Ost 1st Mrg to 1st Mrg lesion, 60 %stenosed. - Medical managment  Dist LAD-2 lesion, 40 %stenosed. Dist LAD-1 lesion, 50 %stenosed. Ost 1st Diag to 1st Diag lesion, 50 %stenosed.  The left ventricular systolic function is normal.  LV end diastolic pressure is normal.  The left ventricular ejection fraction is 50-55% by visual estimate.  There is trivial (1+) mitral regurgitation.  There is no aortic valve stenosis. There is no aortic valve regurgitation.   Successful PCI of the culprit lesion for his acute MI. One single DES stent was placed. Synergy was chosen because of his history of GI bleed on Plavix.  There is existing disease in the left main and ostial circumflex-OM1. These lesions do seem concerning, however in the lack of antecedent anginal symptoms, I felt that the best course of action was PCI on the LAD with medical management only of the lesions based on his age.  Plan: Admit to inpatient -  ASA + Plavix x 3 month if possible, then d/c ASA    Echo 12/12/2016 LV EF: 60% -   65% Study Conclusions  - Left ventricle: The cavity size was normal. Wall thickness was   normal. Systolic function was normal. The estimated ejection   fraction was in the range of 60% to 65%. Wall motion was normal;   there were no regional wall motion abnormalities. Features are   consistent with a pseudonormal left ventricular filling pattern,   with concomitant abnormal relaxation and increased filling   pressure (grade 2 diastolic dysfunction). - Left atrium: The atrium was mildly dilated.     ASSESSMENT:    1. Coronary artery disease involving native coronary artery of native heart without angina pectoris   2. PAF (paroxysmal atrial fibrillation) ([place])   3.  Medication management   4. H/O: CVA (cerebrovascular accident)   5. H/O: GI bleed   6. Fatigue, unspecified type   7. Hypertriglyceridemia      PLAN:  In order of problems listed above:  1. CAD: Underwent DES to the mid LAD, still has a 45% left main residual. He has not  had any recurrent chest pain. We further discussed the need for compliance with dual antiplatelet therapy. Although cath report mentions we can potentially discontinue aspirin after 3 months, however will defer that decision to the primary cardiologist.  2. PAF: No obvious recurrence not on systemic anticoagulation given history of GI bleed. Risk of bleeding with systemic anticoagulation on top of DAPT is too high at this time. May reconsider if he does have any recurrence.  3. Fatigue: Given her history of GI bleed and need for 21 papillated therapy, we will check a CBC.  4. Hypertriglyceridemia: Recent lab work shows triglyceride of 255, I will add fish oil to her medication list. He will repeat a fasting lipid panel in 6 weeks.  5. h/o CVA: No obvious recurrence  6. h/o GI bleed: He has not noticed any bleeding issues since the hospitalization in 2014, however given recent fatigue, I will recheck CBC.    Medication Adjustments/Labs and Tests Ordered: Current medicines are reviewed at length with the patient today.  Concerns regarding medicines are outlined above.  Medication changes, Labs and Tests ordered today are listed in the Patient Instructions below. Patient Instructions  Medication Instructions:  INCREASE METOPROLOL TO 25MG  TWICE DAILY START LOVAZA 1GRAM DAILY  If you need a refill on your cardiac medications before your next appointment, please call your pharmacy.  Labwork: CBC TODAY AT [facility] ON THE 1ST FLOOR FASTING LIPID PANEL IN 6 WEEKS (THE FIRST WEEK OF MAY)  Follow-Up: Your physician wants you to follow-up in: 2 MONTHS WITH DR HARDING.  Thank you for choosing [facility] at  [facility]!!    Jaysen Wey, PA-C [place], LPN     Signed, Almyra Deforest, [place]  12/24/2016 12:51 PM    [facility] [place], [place], [place]  [address] Phone: [phone]; Fax: [phone]

## 2016-12-25 LAB — LIPID PANEL

## 2016-12-31 ENCOUNTER — Telehealth: Payer: Self-pay | Admitting: *Deleted

## 2016-12-31 ENCOUNTER — Other Ambulatory Visit: Payer: Self-pay | Admitting: Physician Assistant

## 2016-12-31 MED ORDER — FENOFIBRATE 54 MG PO TABS: 54.0000 mg | ORAL_TABLET | Freq: Every day | ORAL | 6 refills | Status: DC

## 2016-12-31 NOTE — Telephone Encounter (Signed)
Received from [place] not covered on patient's formulary  Started covermymed -- not covered   Spoke to [employer] PA  - RECOMMEND CHANGE TO FENOFIBRATE 54 MG ONE TABLETET DAILY   PRESCRIPTION PHONE IN TO [facility]  LEFT MESSAGE FOR PATIENT TO CALL BACK - WOULD LIKE TO INFORM HIM OF THE CHANGES

## 2017-01-02 DIAGNOSIS — Z6825 Body mass index (BMI) 25.0-25.9, adult: Secondary | ICD-10-CM | POA: Diagnosis not present

## 2017-01-02 DIAGNOSIS — I2102 ST elevation (STEMI) myocardial infarction involving left anterior descending coronary artery: Secondary | ICD-10-CM | POA: Diagnosis not present

## 2017-01-02 DIAGNOSIS — E784 Other hyperlipidemia: Secondary | ICD-10-CM | POA: Diagnosis not present

## 2017-01-02 DIAGNOSIS — E1129 Type 2 diabetes mellitus with other diabetic kidney complication: Secondary | ICD-10-CM | POA: Diagnosis not present

## 2017-01-02 DIAGNOSIS — I639 Cerebral infarction, unspecified: Secondary | ICD-10-CM | POA: Diagnosis not present

## 2017-01-02 DIAGNOSIS — E038 Other specified hypothyroidism: Secondary | ICD-10-CM | POA: Diagnosis not present

## 2017-01-02 DIAGNOSIS — Z1389 Encounter for screening for other disorder: Secondary | ICD-10-CM | POA: Diagnosis not present

## 2017-01-02 DIAGNOSIS — I4891 Unspecified atrial fibrillation: Secondary | ICD-10-CM | POA: Diagnosis not present

## 2017-01-02 DIAGNOSIS — I10 Essential (primary) hypertension: Secondary | ICD-10-CM | POA: Diagnosis not present

## 2017-01-04 NOTE — Telephone Encounter (Signed)
SPOKE TO PATIENT ,VERBALIZED UNDERSTANDING OF FENOFIBRATE CHANGES

## 2017-01-07 ENCOUNTER — Telehealth: Payer: Self-pay | Admitting: Physician Assistant

## 2017-01-07 NOTE — Telephone Encounter (Signed)
Spoke with patient and ok per patient to speak with daughter Advised daughter not sure what exactly the status of his rehab is but will call them tomorrow

## 2017-01-07 NOTE — Telephone Encounter (Signed)
New message      Pt has not heard from cardiac rehab.  Daughter is calling to see if the doctor still want him to go to cardiac rehab.  Please call and give update

## 2017-01-08 ENCOUNTER — Telehealth (HOSPITAL_COMMUNITY): Payer: Self-pay | Admitting: Endocrinology

## 2017-01-08 NOTE — Telephone Encounter (Signed)
Sent staff message to Alfred Levins RN cardiac rehab and referral there and they will be contacting patient. Called daughter and she stated she heard from them today

## 2017-01-08 NOTE — Telephone Encounter (Signed)
Verified Medicare A/B & UA Supplemental ins benefits through [employer]. UA will p/u what Medicare doesn't pay.... Reference [id number] & [phone].... KJ

## 2017-01-08 NOTE — Telephone Encounter (Signed)
Lft msg for pt to return call to see about setting up with our CRP, s/w daughter [name] from the f/u sheet, she just wanted someone to contact her father about getting set up with our classes.... KJ

## 2017-01-11 DIAGNOSIS — I10 Essential (primary) hypertension: Secondary | ICD-10-CM | POA: Diagnosis not present

## 2017-01-11 DIAGNOSIS — I2102 ST elevation (STEMI) myocardial infarction involving left anterior descending coronary artery: Secondary | ICD-10-CM | POA: Diagnosis not present

## 2017-01-11 DIAGNOSIS — E784 Other hyperlipidemia: Secondary | ICD-10-CM | POA: Diagnosis not present

## 2017-01-11 DIAGNOSIS — E1129 Type 2 diabetes mellitus with other diabetic kidney complication: Secondary | ICD-10-CM | POA: Diagnosis not present

## 2017-01-22 DIAGNOSIS — I2109 ST elevation (STEMI) myocardial infarction involving other coronary artery of anterior wall: Secondary | ICD-10-CM | POA: Diagnosis not present

## 2017-01-28 DIAGNOSIS — I2109 ST elevation (STEMI) myocardial infarction involving other coronary artery of anterior wall: Secondary | ICD-10-CM | POA: Diagnosis not present

## 2017-01-30 DIAGNOSIS — I2109 ST elevation (STEMI) myocardial infarction involving other coronary artery of anterior wall: Secondary | ICD-10-CM | POA: Diagnosis not present

## 2017-02-04 DIAGNOSIS — I2109 ST elevation (STEMI) myocardial infarction involving other coronary artery of anterior wall: Secondary | ICD-10-CM | POA: Diagnosis not present

## 2017-02-06 DIAGNOSIS — I2109 ST elevation (STEMI) myocardial infarction involving other coronary artery of anterior wall: Secondary | ICD-10-CM | POA: Diagnosis not present

## 2017-02-07 DIAGNOSIS — I2109 ST elevation (STEMI) myocardial infarction involving other coronary artery of anterior wall: Secondary | ICD-10-CM | POA: Diagnosis not present

## 2017-02-11 DIAGNOSIS — I2109 ST elevation (STEMI) myocardial infarction involving other coronary artery of anterior wall: Secondary | ICD-10-CM | POA: Diagnosis not present

## 2017-02-13 DIAGNOSIS — I2109 ST elevation (STEMI) myocardial infarction involving other coronary artery of anterior wall: Secondary | ICD-10-CM | POA: Diagnosis not present

## 2017-02-14 DIAGNOSIS — I2109 ST elevation (STEMI) myocardial infarction involving other coronary artery of anterior wall: Secondary | ICD-10-CM | POA: Diagnosis not present

## 2017-02-18 DIAGNOSIS — I2109 ST elevation (STEMI) myocardial infarction involving other coronary artery of anterior wall: Secondary | ICD-10-CM | POA: Diagnosis not present

## 2017-02-20 DIAGNOSIS — I2109 ST elevation (STEMI) myocardial infarction involving other coronary artery of anterior wall: Secondary | ICD-10-CM | POA: Diagnosis not present

## 2017-02-21 DIAGNOSIS — I2109 ST elevation (STEMI) myocardial infarction involving other coronary artery of anterior wall: Secondary | ICD-10-CM | POA: Diagnosis not present

## 2017-02-27 DIAGNOSIS — I2109 ST elevation (STEMI) myocardial infarction involving other coronary artery of anterior wall: Secondary | ICD-10-CM | POA: Diagnosis not present

## 2017-02-28 DIAGNOSIS — I2109 ST elevation (STEMI) myocardial infarction involving other coronary artery of anterior wall: Secondary | ICD-10-CM | POA: Diagnosis not present

## 2017-03-01 ENCOUNTER — Ambulatory Visit (INDEPENDENT_AMBULATORY_CARE_PROVIDER_SITE_OTHER): Payer: Medicare Other | Admitting: Cardiology

## 2017-03-01 VITALS — BP 169/81 | HR 69 | Ht 69.0 in | Wt 172.4 lb

## 2017-03-01 DIAGNOSIS — I4891 Unspecified atrial fibrillation: Secondary | ICD-10-CM | POA: Diagnosis not present

## 2017-03-01 DIAGNOSIS — I2109 ST elevation (STEMI) myocardial infarction involving other coronary artery of anterior wall: Secondary | ICD-10-CM

## 2017-03-01 DIAGNOSIS — I2102 ST elevation (STEMI) myocardial infarction involving left anterior descending coronary artery: Secondary | ICD-10-CM

## 2017-03-01 DIAGNOSIS — Z8719 Personal history of other diseases of the digestive system: Secondary | ICD-10-CM | POA: Diagnosis not present

## 2017-03-01 DIAGNOSIS — I2510 Atherosclerotic heart disease of native coronary artery without angina pectoris: Secondary | ICD-10-CM

## 2017-03-01 DIAGNOSIS — I10 Essential (primary) hypertension: Secondary | ICD-10-CM | POA: Diagnosis not present

## 2017-03-01 DIAGNOSIS — E785 Hyperlipidemia, unspecified: Secondary | ICD-10-CM

## 2017-03-01 DIAGNOSIS — Z9861 Coronary angioplasty status: Secondary | ICD-10-CM

## 2017-03-01 MED ORDER — METOPROLOL TARTRATE 25 MG PO TABS: 25.0000 mg | ORAL_TABLET | Freq: Two times a day (BID) | ORAL | 11 refills | Status: DC

## 2017-03-01 NOTE — Progress Notes (Signed)
PCP: Reynold Bowen, MD  Clinic Note: Chief Complaint  Patient presents with  . Follow-up    Recent anterior STEMI with LAD PCI    HPI: [name] is a 83 y.o. male with a PMH below who presents today for three-month follow-up for CAD-PCI.Marland Kitchen He had an anterior STEMI back in March 2018. He does have history of a major GI bleed requiring transfusion in 2014. Also history of ischemic stroke in 2012 with carotid artery disease.  He has hypertension and diet-controlled diabetes.  DONIVAN THAMMAVONG was last seen on the [place], [place] on 12/24/2016 - he denied any recurrent chest discomfort. Compliant with medications. He did note some fatigue. Therefore a CBC was checked. Fish oil was added to his regimen for triglycerides 255.  Recent Hospitalizations: None since MI  Studies Personally Reviewed - (if available, images/films reviewed: From Epic Chart or Care Everywhere)  Cath/PCI (11/2016):    Culprit Lesion.Mid LAD lesion, 99 %stenosed.  A STENT SYNERGY DES 2.75X16 drug eluting stent was successfully placed - postdilated to 3.0 mm  Post intervention, there is a 0% residual stenosis.  ___________________________________  LM lesion, 45 %stenosed. Questionable significance. In several views it looks to be significant. However as he is not had symptoms prior to this episode, likely not symptom limiting. Plan medical management  Ost Cx to Prox Cx lesion, 60 %stenosed. Ost 1st Mrg to 1st Mrg lesion, 60 %stenosed. - Medical managment  Dist LAD-2 lesion, 40 %stenosed. Dist LAD-1 lesion, 50 %stenosed. Ost 1st Diag to 1st Diag lesion, 50 %stenosed.  The left ventricular systolic function is normal. The left ventricular ejection fraction is 50-55% by visual estimate.  Anteroapical HK.  LV end diastolic pressure is normal.   Successful PCI of the culprit lesion for his acute MI. One single DES stent was placed. Synergy was chosen because of his history of GI bleed on Plavix.    2 D Echo  12/12/2016: Normal LV size with normal function. EF 60 deficits 5% with no regional wall motion abnormality. GR 2 DD. Mild LA dilation.  Interval History: [name] presents today doing quite well with no major complaints. He is doing cardiac rehabilitation. He states he's had one episode of light less chest pain lasting a few seconds since his discharge. But that was not like his angina symptoms. Besides some left knee swelling, he really does free of any complaints.  From a cardiac standpoint, review of symptoms as follows:  No chest pain or shortness of breath with rest or exertion.  No PND, orthopnea or edema. No palpitations, lightheadedness, dizziness, weakness or syncope/near syncope. No TIA/amaurosis fugax symptoms. No melena, hematochezia, hematuria, or epstaxis. No claudication.  ROS: A comprehensive was performed. Review of Systems  Constitutional: Negative for malaise/fatigue and weight loss.  Respiratory: Negative for shortness of breath.   Cardiovascular:       Per history of present illness  Gastrointestinal: Negative for heartburn.  Musculoskeletal: Positive for joint pain (Left knee).  Neurological: Negative for dizziness, focal weakness and loss of consciousness.  Endo/Heme/Allergies: Does not bruise/bleed easily.  Psychiatric/Behavioral: Negative for depression and memory loss. The patient is not nervous/anxious.   All other systems reviewed and are negative.  I have reviewed and (if needed) personally updated the patient's problem list, medications, allergies, past medical and surgical history, social and family history.   Past Medical History:  Diagnosis Date  . Anemia   . Arthritis   . CAD S/P percutaneous coronary angioplasty  a. 11/2016: anterior STEMI s/p DES to LAD.   . Diabetes mellitus without complication ([place])    Diet controlled  . GERD (gastroesophageal reflux disease)   . GI bleed    10/2013: 2/2 duodenal ulcer. ASA/plavix discontinued.   .  Hyperlipemia   . Hypertension   . PAF (paroxysmal atrial fibrillation) ([place])    a. noted during admission in 10/2013 for acute GI bleed. No recurrunce since. Monitor in 2014 with only PAC/PVCs  . ST elevation myocardial infarction (STEMI) involving left anterior descending (LAD) coronary artery with complication ([place]) 50/06/3817   - 99% subtotal occlusion of LAD treated with DES stent.  . Stroke [facility])    a. ichemic CVA in 2012.     Past Surgical History:  Procedure Laterality Date  . CORONARY STENT INTERVENTION N/A 12/11/2016   Procedure: Coronary Stent Intervention-Mid LAD;  Surgeon: Leonie Man, MD;  Location: [place] CV LAB;  Service: Cardiovascular:  mLAD 99%-0% Synergy DES 2.75 x 16 (3.0 mm)  . ESOPHAGOGASTRODUODENOSCOPY N/A 09/23/2013   Procedure: ESOPHAGOGASTRODUODENOSCOPY (EGD);  Surgeon: Lafayette Dragon, MD;  Location: [facility] ENDOSCOPY;  Service: Endoscopy;  Laterality: N/A;  . ESOPHAGOGASTRODUODENOSCOPY N/A 09/25/2013   Procedure: ESOPHAGOGASTRODUODENOSCOPY (EGD);  Surgeon: Beryle Beams, MD;  Location: [facility] ENDOSCOPY;  Service: Endoscopy;  Laterality: N/A;  . ESOPHAGOGASTRODUODENOSCOPY N/A 12/18/2013   Procedure: ESOPHAGOGASTRODUODENOSCOPY (EGD);  Surgeon: Beryle Beams, MD;  Location: [facility];  Service: Endoscopy;  Laterality: N/A;  . LEFT HEART CATH AND CORONARY ANGIOGRAPHY N/A 12/11/2016   Procedure: Left Heart Cath and Coronary Angiography;  Surgeon: Leonie Man, MD;  Location: [facility] LAB;  Service: Cardiovascular: LM 45%. mLAD 99% (PCI), dLAD 40% & 50%, ostD1 - 50%. Ost-prox Cx 60%, OstOM1 60% (Med Rx).  Anteroapical HK - EF 50-55%. Normal LVEDP  . TOTAL HIP ARTHROPLASTY Right 04/28/2013   Procedure: RIGHT TOTAL HIP ARTHROPLASTY ANTERIOR APPROACH;  Surgeon: Mauri Pole, MD;  Location: [facility] ORS;  Service: Orthopedics;  Laterality: Right;  . TRANSTHORACIC ECHOCARDIOGRAM  12/12/2016   Status post anterior STEMI:  Normal LV size with normal function. EF 60 deficits  5% with no regional wall motion abnormality. GR 2 DD. Mild LA dilation.   Cardiac Cath-PCI 12/11/2016: Wall Motion: EF 50-50%;    Diagnostic Diagram     Post-Intervention 2= Hypokinetic   Culprit 99% mLAD    Synergy DES 2.75 x 16 (3.0 mm) ,       Current Meds  Medication Sig  . aspirin 81 MG chewable tablet Chew 1 tablet (81 mg total) by mouth daily.  . clopidogrel (PLAVIX) 75 MG tablet Take 1 tablet (75 mg total) by mouth daily with breakfast.  . ergocalciferol (VITAMIN D2) 50000 UNITS capsule Take 50,000 Units by mouth once a week. Patient takes on Mondays  . fenofibrate 54 MG tablet Take 1 tablet (54 mg total) by mouth daily.  Marland Kitchen levothyroxine (SYNTHROID, LEVOTHROID) 50 MCG tablet Take 50 mcg by mouth daily before breakfast.  . metoprolol tartrate (LOPRESSOR) 25 MG tablet Take 1 tablet (25 mg total) by mouth 2 (two) times daily.  . nitroGLYCERIN (NITROSTAT) 0.4 MG SL tablet Place 1 tablet (0.4 mg total) under the tongue every 5 (five) minutes x 3 doses as needed for chest pain.  . pantoprazole (PROTONIX) 40 MG tablet Take 1 tablet (40 mg total) by mouth daily.  . rosuvastatin (CRESTOR) 20 MG tablet Take 20 mg by mouth every morning.   . [DISCONTINUED] metoprolol tartrate (LOPRESSOR) 25 MG  tablet Take 0.5 tablets (12.5 mg total) by mouth 2 (two) times daily.    Allergies  Allergen Reactions  . Adhesive [Tape] Rash    Zoll pads adhesive    Social History   Social History  . Marital status: Widowed    Spouse name: N/A  . Number of children: N/A  . Years of education: N/A   Social History Main Topics  . Smoking status: Never Smoker  . Smokeless tobacco: Never Used  . Alcohol use 1.8 oz/week    3 Shots of liquor per week     Comment: wine or vodka   . Drug use: No  . Sexual activity: Not Asked   Other Topics Concern  . None   Social History Narrative  . None    family history is not on file. Noncontributory based on his age and existing risk factors.  Wt Readings  from Last 3 Encounters:  03/01/17 172 lb 6.4 oz (78.2 kg)  12/24/16 170 lb 9.6 oz (77.4 kg)  12/13/16 168 lb 6.4 oz (76.4 kg)    PHYSICAL EXAM BP (!) 169/81   Pulse 69   Ht 5\' 9"  (1.753 m)   Wt 172 lb 6.4 oz (78.2 kg)   BMI 25.46 kg/m  General appearance: alert, cooperative, appearsYounger than stated age, no distress. Well-nourished and well-groomed.  HEENT: [place]/AT, EOMI, MMM, anicteric sclera Neck: no adenopathy, no carotid bruit and no JVD Lungs: clear to auscultation bilaterally, normal percussion bilaterally and non-labored Heart: regular rate and rhythm, S1 &S2 normal, no murmur, click, rub or gallop; nondisplaced PMI Abdomen: soft, non-tender; bowel sounds normal; no masses,  no organomegaly; no HJR Extremities: extremities normal, atraumatic, no cyanosis, or edema  Pulses: 2+ and symmetric;  Skin: mobility and turgor normal, no evidence of bleeding or bruising, temperature normal and texture normal  Neurologic: Mental status: Alert & oriented x 3, thought content appropriate; non-focal exam.  Pleasant mood & affect.   Adult ECG Report n/a  Other studies Reviewed: Additional studies/ records that were reviewed today include:  Recent Labs:    Lab Results  Component Value Date   WBC 9.2 12/24/2016   HGB 13.8 12/24/2016   HCT 42.7 12/24/2016   MCV 96.0 12/24/2016   PLT 307 12/24/2016   Due for labs next week.By PCP   ASSESSMENT / PLAN: Problem List Items Addressed This Visit    Atrial fibrillation with RVR (HCC) (Chronic)    No sign of recurrence. We are increasing his beta blocker dose for rate control if it does occur. Not on full anticoagulation as he is on aspirin and Plavix. Unless her sign of recurrence, which is to continue aspirin plus Plavix.      Relevant Medications   metoprolol tartrate (LOPRESSOR) 25 MG tablet   CAD S/P percutaneous coronary angioplasty - Primary (Chronic)    DES stent to the LAD. He has existing left main and circumflex-OM disease.  This could potentially be physiologically significant, but in the absence of anginal symptoms, prefer to avoid attempting PCI in these areas. Ostial OM and ostial circumflex lesions are difficult to treat without affecting the main channel.  Is on aspirin, Plavix, statin and beta blocker.      Relevant Medications   metoprolol tartrate (LOPRESSOR) 25 MG tablet   Dyslipidemia, goal LDL below 70 (Chronic)    Is on Crestor plus fenofibrate. Not taking the fish oil that was previously prescribed. Due for labs to be checked by PCP him in the next week  or 2. We needs to see the results.      Relevant Medications   metoprolol tartrate (LOPRESSOR) 25 MG tablet   Essential hypertension (Chronic)    Pretty hypertensive today. Aorta simple by things will simply increase his metoprolol dose to 25 mL twice a day. Could consider ARB next.      Relevant Medications   metoprolol tartrate (LOPRESSOR) 25 MG tablet   History of GI bleed (Chronic)    Would avoid triple therapy. As he is not had any recurrence of A. fib, he was not on any for regulation. Therefore only on aspirin and Plavix. Hemoglobin stable.      ST elevation myocardial infarction (STEMI) involving left anterior descending (LAD) coronary artery without development of Q waves (HCC) (Chronic)    Resolution of anterior wall motion abnormalities noted on post MI echo. This would suggest that he had a pretty rapid presentation. No recurrent anginal or heart failure symptoms.  Currently in cardiac rehabilitation      Relevant Medications   metoprolol tartrate (LOPRESSOR) 25 MG tablet      Current medicines are reviewed at length with the patient today. (+/- concerns) n/a The following changes have been made: n/a  Patient Instructions  INCREASE METOPROLOL TARTRATE 25 MG  ONE TABLET TWICE A DAY   PLEASE HAVE PRIMARY  DRAW LABS- LIPIDS    MAY HOLD ASPIRIN FOR 2-3 DAYS- EXCESSIVE BLEEDING OR BRUISING.   Your physician wants you  to follow-up in [place]. You will receive a reminder letter in the mail two months in advance. If you don't receive a letter, please call our office to schedule the follow-up appointment.    Studies Ordered:   No orders of the defined types were placed in this encounter.     Glenetta Hew, M.D., M.S. Interventional Cardiologist   Pager # [phone] Phone # [phone] [address] [place], [place] [address]

## 2017-03-01 NOTE — Patient Instructions (Addendum)
INCREASE METOPROLOL TARTRATE 25 MG  ONE TABLET TWICE A DAY   PLEASE HAVE PRIMARY  DRAW LABS- LIPIDS    MAY HOLD ASPIRIN FOR 2-3 DAYS- EXCESSIVE BLEEDING OR BRUISING.   Your physician wants you to follow-up in [place]. You will receive a reminder letter in the mail two months in advance. If you don't receive a letter, please call our office to schedule the follow-up appointment.

## 2017-03-03 ENCOUNTER — Encounter: Payer: Self-pay | Admitting: Cardiology

## 2017-03-03 NOTE — Assessment & Plan Note (Signed)
Pretty hypertensive today. Aorta simple by things will simply increase his metoprolol dose to 25 mL twice a day. Could consider ARB next.

## 2017-03-03 NOTE — Assessment & Plan Note (Signed)
Would avoid triple therapy. As he is not had any recurrence of A. fib, he was not on any for regulation. Therefore only on aspirin and Plavix. Hemoglobin stable.

## 2017-03-03 NOTE — Assessment & Plan Note (Signed)
Is on Crestor plus fenofibrate. Not taking the fish oil that was previously prescribed. Due for labs to be checked by PCP him in the next week or 2. We needs to see the results.

## 2017-03-03 NOTE — Assessment & Plan Note (Signed)
No sign of recurrence. We are increasing his beta blocker dose for rate control if it does occur. Not on full anticoagulation as he is on aspirin and Plavix. Unless her sign of recurrence, which is to continue aspirin plus Plavix.

## 2017-03-03 NOTE — Assessment & Plan Note (Addendum)
Resolution of anterior wall motion abnormalities noted on post MI echo. This would suggest that he had a pretty rapid presentation. No recurrent anginal or heart failure symptoms.  Currently in cardiac rehabilitation

## 2017-03-03 NOTE — Assessment & Plan Note (Addendum)
DES stent to the LAD. He has existing left main and circumflex-OM disease. This could potentially be physiologically significant, but in the absence of anginal symptoms, prefer to avoid attempting PCI in these areas. Ostial OM and ostial circumflex lesions are difficult to treat without affecting the main channel.  Is on aspirin, Plavix, statin and beta blocker.

## 2017-03-04 DIAGNOSIS — I2109 ST elevation (STEMI) myocardial infarction involving other coronary artery of anterior wall: Secondary | ICD-10-CM | POA: Diagnosis not present

## 2017-03-06 DIAGNOSIS — I2109 ST elevation (STEMI) myocardial infarction involving other coronary artery of anterior wall: Secondary | ICD-10-CM | POA: Diagnosis not present

## 2017-03-07 DIAGNOSIS — I2109 ST elevation (STEMI) myocardial infarction involving other coronary artery of anterior wall: Secondary | ICD-10-CM | POA: Diagnosis not present

## 2017-03-11 DIAGNOSIS — I2109 ST elevation (STEMI) myocardial infarction involving other coronary artery of anterior wall: Secondary | ICD-10-CM | POA: Diagnosis not present

## 2017-03-13 DIAGNOSIS — E038 Other specified hypothyroidism: Secondary | ICD-10-CM | POA: Diagnosis not present

## 2017-03-13 DIAGNOSIS — I2102 ST elevation (STEMI) myocardial infarction involving left anterior descending coronary artery: Secondary | ICD-10-CM | POA: Diagnosis not present

## 2017-03-13 DIAGNOSIS — I4891 Unspecified atrial fibrillation: Secondary | ICD-10-CM | POA: Diagnosis not present

## 2017-03-13 DIAGNOSIS — I639 Cerebral infarction, unspecified: Secondary | ICD-10-CM | POA: Diagnosis not present

## 2017-03-13 DIAGNOSIS — Z1389 Encounter for screening for other disorder: Secondary | ICD-10-CM | POA: Diagnosis not present

## 2017-03-13 DIAGNOSIS — Z6825 Body mass index (BMI) 25.0-25.9, adult: Secondary | ICD-10-CM | POA: Diagnosis not present

## 2017-03-13 DIAGNOSIS — K922 Gastrointestinal hemorrhage, unspecified: Secondary | ICD-10-CM | POA: Diagnosis not present

## 2017-03-13 DIAGNOSIS — I10 Essential (primary) hypertension: Secondary | ICD-10-CM | POA: Diagnosis not present

## 2017-03-13 DIAGNOSIS — E784 Other hyperlipidemia: Secondary | ICD-10-CM | POA: Diagnosis not present

## 2017-03-13 DIAGNOSIS — N08 Glomerular disorders in diseases classified elsewhere: Secondary | ICD-10-CM | POA: Diagnosis not present

## 2017-03-13 DIAGNOSIS — E1151 Type 2 diabetes mellitus with diabetic peripheral angiopathy without gangrene: Secondary | ICD-10-CM | POA: Diagnosis not present

## 2017-03-14 DIAGNOSIS — I2109 ST elevation (STEMI) myocardial infarction involving other coronary artery of anterior wall: Secondary | ICD-10-CM | POA: Diagnosis not present

## 2017-03-18 DIAGNOSIS — I2109 ST elevation (STEMI) myocardial infarction involving other coronary artery of anterior wall: Secondary | ICD-10-CM | POA: Diagnosis not present

## 2017-03-20 DIAGNOSIS — I2109 ST elevation (STEMI) myocardial infarction involving other coronary artery of anterior wall: Secondary | ICD-10-CM | POA: Diagnosis not present

## 2017-03-21 DIAGNOSIS — I2109 ST elevation (STEMI) myocardial infarction involving other coronary artery of anterior wall: Secondary | ICD-10-CM | POA: Diagnosis not present

## 2017-03-25 DIAGNOSIS — I2109 ST elevation (STEMI) myocardial infarction involving other coronary artery of anterior wall: Secondary | ICD-10-CM | POA: Diagnosis not present

## 2017-03-27 DIAGNOSIS — I2109 ST elevation (STEMI) myocardial infarction involving other coronary artery of anterior wall: Secondary | ICD-10-CM | POA: Diagnosis not present

## 2017-03-28 DIAGNOSIS — I2109 ST elevation (STEMI) myocardial infarction involving other coronary artery of anterior wall: Secondary | ICD-10-CM | POA: Diagnosis not present

## 2017-04-01 DIAGNOSIS — I2109 ST elevation (STEMI) myocardial infarction involving other coronary artery of anterior wall: Secondary | ICD-10-CM | POA: Diagnosis not present

## 2017-04-04 DIAGNOSIS — I2109 ST elevation (STEMI) myocardial infarction involving other coronary artery of anterior wall: Secondary | ICD-10-CM | POA: Diagnosis not present

## 2017-04-08 DIAGNOSIS — I2109 ST elevation (STEMI) myocardial infarction involving other coronary artery of anterior wall: Secondary | ICD-10-CM | POA: Diagnosis not present

## 2017-04-10 DIAGNOSIS — I2109 ST elevation (STEMI) myocardial infarction involving other coronary artery of anterior wall: Secondary | ICD-10-CM | POA: Diagnosis not present

## 2017-04-11 DIAGNOSIS — I2109 ST elevation (STEMI) myocardial infarction involving other coronary artery of anterior wall: Secondary | ICD-10-CM | POA: Diagnosis not present

## 2017-04-15 DIAGNOSIS — I2109 ST elevation (STEMI) myocardial infarction involving other coronary artery of anterior wall: Secondary | ICD-10-CM | POA: Diagnosis not present

## 2017-04-17 DIAGNOSIS — I2109 ST elevation (STEMI) myocardial infarction involving other coronary artery of anterior wall: Secondary | ICD-10-CM | POA: Diagnosis not present

## 2017-04-18 DIAGNOSIS — I2109 ST elevation (STEMI) myocardial infarction involving other coronary artery of anterior wall: Secondary | ICD-10-CM | POA: Diagnosis not present

## 2017-04-22 DIAGNOSIS — I2109 ST elevation (STEMI) myocardial infarction involving other coronary artery of anterior wall: Secondary | ICD-10-CM | POA: Diagnosis not present

## 2017-04-24 DIAGNOSIS — I2109 ST elevation (STEMI) myocardial infarction involving other coronary artery of anterior wall: Secondary | ICD-10-CM | POA: Diagnosis not present

## 2017-04-25 DIAGNOSIS — I2109 ST elevation (STEMI) myocardial infarction involving other coronary artery of anterior wall: Secondary | ICD-10-CM | POA: Diagnosis not present

## 2017-05-09 DIAGNOSIS — R197 Diarrhea, unspecified: Secondary | ICD-10-CM | POA: Diagnosis not present

## 2017-05-09 DIAGNOSIS — Z6824 Body mass index (BMI) 24.0-24.9, adult: Secondary | ICD-10-CM | POA: Diagnosis not present

## 2017-05-09 DIAGNOSIS — R5383 Other fatigue: Secondary | ICD-10-CM | POA: Diagnosis not present

## 2017-07-16 DIAGNOSIS — Z23 Encounter for immunization: Secondary | ICD-10-CM | POA: Diagnosis not present

## 2017-08-02 DIAGNOSIS — I2102 ST elevation (STEMI) myocardial infarction involving left anterior descending coronary artery: Secondary | ICD-10-CM | POA: Diagnosis not present

## 2017-08-02 DIAGNOSIS — I6529 Occlusion and stenosis of unspecified carotid artery: Secondary | ICD-10-CM | POA: Diagnosis not present

## 2017-08-02 DIAGNOSIS — I10 Essential (primary) hypertension: Secondary | ICD-10-CM | POA: Diagnosis not present

## 2017-08-02 DIAGNOSIS — N08 Glomerular disorders in diseases classified elsewhere: Secondary | ICD-10-CM | POA: Diagnosis not present

## 2017-08-02 DIAGNOSIS — I4891 Unspecified atrial fibrillation: Secondary | ICD-10-CM | POA: Diagnosis not present

## 2017-08-02 DIAGNOSIS — E559 Vitamin D deficiency, unspecified: Secondary | ICD-10-CM | POA: Diagnosis not present

## 2017-08-02 DIAGNOSIS — E038 Other specified hypothyroidism: Secondary | ICD-10-CM | POA: Diagnosis not present

## 2017-08-02 DIAGNOSIS — Z6825 Body mass index (BMI) 25.0-25.9, adult: Secondary | ICD-10-CM | POA: Diagnosis not present

## 2017-08-02 DIAGNOSIS — I6389 Other cerebral infarction: Secondary | ICD-10-CM | POA: Diagnosis not present

## 2017-08-02 DIAGNOSIS — E1129 Type 2 diabetes mellitus with other diabetic kidney complication: Secondary | ICD-10-CM | POA: Diagnosis not present

## 2017-08-02 DIAGNOSIS — E7849 Other hyperlipidemia: Secondary | ICD-10-CM | POA: Diagnosis not present

## 2017-08-09 DIAGNOSIS — Z85828 Personal history of other malignant neoplasm of skin: Secondary | ICD-10-CM | POA: Diagnosis not present

## 2017-08-09 DIAGNOSIS — L820 Inflamed seborrheic keratosis: Secondary | ICD-10-CM | POA: Diagnosis not present

## 2017-08-09 DIAGNOSIS — D485 Neoplasm of uncertain behavior of skin: Secondary | ICD-10-CM | POA: Diagnosis not present

## 2017-08-09 DIAGNOSIS — D044 Carcinoma in situ of skin of scalp and neck: Secondary | ICD-10-CM | POA: Diagnosis not present

## 2017-08-09 DIAGNOSIS — L570 Actinic keratosis: Secondary | ICD-10-CM | POA: Diagnosis not present

## 2017-08-12 ENCOUNTER — Encounter (HOSPITAL_BASED_OUTPATIENT_CLINIC_OR_DEPARTMENT_OTHER): Payer: Self-pay | Admitting: Emergency Medicine

## 2017-08-12 ENCOUNTER — Other Ambulatory Visit: Payer: Self-pay

## 2017-08-12 ENCOUNTER — Emergency Department (HOSPITAL_BASED_OUTPATIENT_CLINIC_OR_DEPARTMENT_OTHER)
Admission: EM | Admit: 2017-08-12 | Discharge: 2017-08-13 | Disposition: A | Discharge status: Home / Self Care | Payer: Medicare Other | Attending: Emergency Medicine | Admitting: Emergency Medicine

## 2017-08-12 DIAGNOSIS — I4891 Unspecified atrial fibrillation: Secondary | ICD-10-CM | POA: Diagnosis not present

## 2017-08-12 DIAGNOSIS — L7622 Postprocedural hemorrhage and hematoma of skin and subcutaneous tissue following other procedure: Secondary | ICD-10-CM | POA: Insufficient documentation

## 2017-08-12 DIAGNOSIS — I10 Essential (primary) hypertension: Secondary | ICD-10-CM | POA: Diagnosis not present

## 2017-08-12 DIAGNOSIS — I2510 Atherosclerotic heart disease of native coronary artery without angina pectoris: Secondary | ICD-10-CM | POA: Insufficient documentation

## 2017-08-12 DIAGNOSIS — T8131XA Disruption of external operation (surgical) wound, not elsewhere classified, initial encounter: Secondary | ICD-10-CM | POA: Diagnosis not present

## 2017-08-12 DIAGNOSIS — E785 Hyperlipidemia, unspecified: Secondary | ICD-10-CM | POA: Insufficient documentation

## 2017-08-12 DIAGNOSIS — D649 Anemia, unspecified: Secondary | ICD-10-CM | POA: Diagnosis not present

## 2017-08-12 DIAGNOSIS — T8189XA Other complications of procedures, not elsewhere classified, initial encounter: Secondary | ICD-10-CM | POA: Diagnosis not present

## 2017-08-12 DIAGNOSIS — E119 Type 2 diabetes mellitus without complications: Secondary | ICD-10-CM | POA: Diagnosis not present

## 2017-08-12 DIAGNOSIS — Z79899 Other long term (current) drug therapy: Secondary | ICD-10-CM | POA: Insufficient documentation

## 2017-08-12 DIAGNOSIS — T148XXA Other injury of unspecified body region, initial encounter: Secondary | ICD-10-CM

## 2017-08-12 MED ORDER — "THROMBI-PAD 3""X3"" EX PADS"
MEDICATED_PAD | CUTANEOUS | Status: AC
  Administered 2017-08-13: 1
  Filled 2017-08-12: qty 1

## 2017-08-12 NOTE — ED Triage Notes (Signed)
Patient had a skin tag removed on Friday. The patient reports that it started to bleed at about 4 pm today. Unable to get the bleeding controlled

## 2017-08-13 DIAGNOSIS — L7622 Postprocedural hemorrhage and hematoma of skin and subcutaneous tissue following other procedure: Secondary | ICD-10-CM | POA: Diagnosis not present

## 2017-08-13 DIAGNOSIS — T82838A Hemorrhage of vascular prosthetic devices, implants and grafts, initial encounter: Secondary | ICD-10-CM | POA: Diagnosis not present

## 2017-08-13 DIAGNOSIS — S59919D Unspecified injury of unspecified forearm, subsequent encounter: Secondary | ICD-10-CM | POA: Diagnosis not present

## 2017-08-13 NOTE — ED Notes (Signed)
MD dressed wound, placed small amount of Thrombi pad to wound, wrapped with kerlex and coban

## 2017-08-13 NOTE — ED Notes (Signed)
MD to the bedside, unwrapped and looked at wound. Wound care done and new thrombi pad placed to site. Rewrapped and will continue to montior.

## 2017-08-13 NOTE — ED Notes (Signed)
Contacted [facility] for transport 

## 2017-08-13 NOTE — ED Provider Notes (Signed)
[facility] EMERGENCY DEPARTMENT Provider Note   CSN: [id number] Arrival date & time: 08/12/17  2345     History   Chief Complaint No chief complaint on file.   HPI [name] is a 83 y.o. male.  HPI  This is a 83 year old male with a history of coronary artery disease, hypertension, hyperlipidemia who presents with bleeding from recent skin tag removal site.  Patient had a skin tag removed from his left arm on Friday.  Patient states that earlier today he noted bleeding through his shirt.  He has been unable to get control the bleeding.  It has been wrapped multiple times with bleeding through wrapping.  He denies any other symptoms at this time.  He does take Plavix and aspirin.  Past Medical History:  Diagnosis Date  . Anemia   . Arthritis   . CAD S/P percutaneous coronary angioplasty    a. 11/2016: anterior STEMI s/p DES to LAD.   . Diabetes mellitus without complication ([place])    Diet controlled  . GERD (gastroesophageal reflux disease)   . GI bleed    10/2013: 2/2 duodenal ulcer. ASA/plavix discontinued.   . Hyperlipemia   . Hypertension   . PAF (paroxysmal atrial fibrillation) ([place])    a. noted during admission in 10/2013 for acute GI bleed. No recurrunce since. Monitor in 2014 with only PAC/PVCs  . ST elevation myocardial infarction (STEMI) involving left anterior descending (LAD) coronary artery with complication ([place]) 41/74/0814   - 99% subtotal occlusion of LAD treated with DES stent.  . Stroke [facility])    a. ichemic CVA in 2012.     Patient Active Problem List   Diagnosis Date Noted  . ST elevation myocardial infarction (STEMI) involving left anterior descending (LAD) coronary artery without development of Q waves ([place]) 12/11/2016  . History of GI bleed 12/11/2016  . Dyslipidemia, goal LDL below 70 12/11/2016  . Hypothyroidism 12/11/2016  . CAD S/P percutaneous coronary angioplasty 12/11/2016  . Acute esophagitis 09/28/2013  . Fatty liver 09/28/2013   . Atrial fibrillation with RVR ([place]) 09/28/2013  . Elevated lipase 09/28/2013  . Essential hypertension 09/28/2013  . AKI (acute kidney injury) ([place]) 09/28/2013  . Controlled diabetes mellitus type II without complication ([place]) 48/18/5631  . Acute duodenal ulcer with bleeding 09/26/2013  . Pre-syncope 06/16/2013  . Acute blood loss anemia 04/29/2013  . S/P right THA, AA 04/28/2013  . History of stroke 12/12/2010    Past Surgical History:  Procedure Laterality Date  . TRANSTHORACIC ECHOCARDIOGRAM  12/12/2016   Status post anterior STEMI:  Normal LV size with normal function. EF 60 deficits 5% with no regional wall motion abnormality. GR 2 DD. Mild LA dilation.       Home Medications    Prior to Admission medications   Medication Sig Start Date End Date Taking? Authorizing Provider  aspirin 81 MG chewable tablet Chew 1 tablet (81 mg total) by mouth daily. 12/14/16   Eileen Stanford, PA-C  clopidogrel (PLAVIX) 75 MG tablet Take 1 tablet (75 mg total) by mouth daily with breakfast. 12/14/16   Eileen Stanford, PA-C  ergocalciferol (VITAMIN D2) 50000 UNITS capsule Take 50,000 Units by mouth once a week. Patient takes on Mondays    [provider]  fenofibrate 54 MG tablet Take 1 tablet (54 mg total) by mouth daily. 12/31/16   Almyra Deforest, PA  levothyroxine (SYNTHROID, LEVOTHROID) 50 MCG tablet Take 50 mcg by mouth daily before breakfast.    [provider]  metoprolol tartrate (LOPRESSOR) 25 MG tablet Take 1 tablet (25 mg total) by mouth 2 (two) times daily. 03/01/17   Leonie Man, MD  nitroGLYCERIN (NITROSTAT) 0.4 MG SL tablet Place 1 tablet (0.4 mg total) under the tongue every 5 (five) minutes x 3 doses as needed for chest pain. 12/13/16   Eileen Stanford, PA-C  pantoprazole (PROTONIX) 40 MG tablet Take 1 tablet (40 mg total) by mouth daily. 12/13/16   Eileen Stanford, PA-C  rosuvastatin (CRESTOR) 20 MG tablet Take 20 mg by mouth every morning.      [provider]    Family History History reviewed. No pertinent family history.  Social History Social History   Tobacco Use  . Smoking status: Never Smoker  . Smokeless tobacco: Never Used  Substance Use Topics  . Alcohol use: Yes    Alcohol/week: 1.8 oz    Types: 3 Shots of liquor per week    Comment: wine or vodka   . Drug use: No     Allergies   Adhesive [tape]   Review of Systems Review of Systems  Constitutional: Negative for fever.  Respiratory: Negative for shortness of breath.   Cardiovascular: Negative for chest pain.  Skin: Positive for wound.  All other systems reviewed and are negative.    Physical Exam Updated Vital Signs BP (!) 147/85 (BP Location: Right Arm)   Pulse 67   Resp 18   Wt 78 kg (172 lb)   SpO2 100%   BMI 25.40 kg/m   Physical Exam  Constitutional: He is oriented to person, place, and time. He appears well-developed and well-nourished. No distress.  HENT:  Head: Normocephalic and atraumatic.  Cardiovascular: Normal rate, regular rhythm and normal heart sounds.  Pulmonary/Chest: Effort normal and breath sounds normal. No respiratory distress.  Musculoskeletal: He exhibits no edema.  Neurological: He is alert and oriented to person, place, and time.  Skin: Skin is warm and dry.  Thin skin, contusion right forearm, left forearm with extensive bruising over the dorsum of the forearm, punctate area of bleeding corresponding with the biopsy site  Psychiatric: He has a normal mood and affect.  Nursing note and vitals reviewed.    ED Treatments / Results  Labs (all labs ordered are listed, but only abnormal results are displayed) Labs Reviewed - No data to display  EKG  EKG Interpretation None       Radiology No results found.  Procedures Procedures (including critical care time)  Medications Ordered in ED Medications  THROMBI-PAD 3"X3" pad (1 each  Given 08/13/17 0001)     Initial Impression / Assessment  and Plan / ED Course  I have reviewed the triage vital signs and the nursing notes.  Pertinent labs & imaging results that were available during my care of the patient were reviewed by me and considered in my medical decision making (see chart for details).     Patient presents with bleeding from recent skin tag removal site.  It is easing.  He has extensive hematoma under the skin of the left forearm.  He is otherwise nontoxic appearing.  Initially a thrombin pad was placed with compressive dressing.  He continued to bleed.  Wound seal was applied to the wound and a second thrombin pad was used with a compressive dressing.  This seemed to temporize the bleed.  No significant bleeding after multiple rechecks.  Recommend continuing compressive dressing for 24 hours.  Patient was given strict return precautions.  After history, exam, and medical workup I feel the patient has been appropriately medically screened and is safe for discharge home. Pertinent diagnoses were discussed with the patient. Patient was given return precautions.   Final Clinical Impressions(s) / ED Diagnoses   Final diagnoses:  Bleeding from wound    ED Discharge Orders    None       Merryl Hacker, MD 08/13/17 [phone]

## 2017-08-13 NOTE — Discharge Instructions (Signed)
You were seen today for bleeding from a skin tag site.  Keep compressive dressing 24 hours.  If you notice significant rebleeding, you should be reevaluated.

## 2017-08-19 DIAGNOSIS — Z5189 Encounter for other specified aftercare: Secondary | ICD-10-CM | POA: Diagnosis not present

## 2017-08-19 DIAGNOSIS — L7622 Postprocedural hemorrhage and hematoma of skin and subcutaneous tissue following other procedure: Secondary | ICD-10-CM | POA: Diagnosis not present

## 2017-08-28 ENCOUNTER — Ambulatory Visit (INDEPENDENT_AMBULATORY_CARE_PROVIDER_SITE_OTHER): Payer: Medicare Other | Admitting: Cardiology

## 2017-08-28 ENCOUNTER — Encounter: Payer: Self-pay | Admitting: Cardiology

## 2017-08-28 VITALS — BP 136/66 | HR 66 | Ht 69.0 in | Wt 173.0 lb

## 2017-08-28 DIAGNOSIS — I2510 Atherosclerotic heart disease of native coronary artery without angina pectoris: Secondary | ICD-10-CM | POA: Diagnosis not present

## 2017-08-28 DIAGNOSIS — Z8719 Personal history of other diseases of the digestive system: Secondary | ICD-10-CM

## 2017-08-28 DIAGNOSIS — E785 Hyperlipidemia, unspecified: Secondary | ICD-10-CM

## 2017-08-28 DIAGNOSIS — Z9861 Coronary angioplasty status: Secondary | ICD-10-CM

## 2017-08-28 DIAGNOSIS — I4891 Unspecified atrial fibrillation: Secondary | ICD-10-CM | POA: Diagnosis not present

## 2017-08-28 NOTE — Progress Notes (Signed)
PCP: Reynold Bowen, MD  Clinic Note: Chief Complaint  Patient presents with  . Follow-up    6 months; stable with no symptoms  . Coronary Artery Disease    Recent anterior MI (March 2018)    HPI: [name] is a 83 y.o. male with a PMH below who presents today for 51-month follow-up for CAD-PCI.Marland Kitchen He had an anterior STEMI in March 2018. He does have history of a major GI bleed requiring transfusion in 2014. Also history of ischemic stroke in 2012 with carotid artery disease.  He has hypertension and diet-controlled diabetes.  [name] was last seen back in June.-Was doing very well at that time.  Just started cardiac rehab.  I increased his metoprolol to 25 twice daily-   Recent Hospitalizations:   He had an emergency room visit after having a skin tag removal.  He had his persistent profuse bleeding that finally had to have cauterization to stop.  Did not require transfusion.  Studies Personally Reviewed - (if available, images/films reviewed: From Epic Chart or Care Everywhere)  No new studies no new studies no new studies no new studies  Interval History: [name] presents today doing quite with no major cardiac complaints.  He graduated from cardiac rehab and is now an exercise program back at his retirement community.  He does try to use a walker or cane especially when it is cold outside.  He says he just a little bit more stiff and wants to have the more stability.  He initially was not thinking he would like cardiac rehab, but then once he got started he loved it and is continued to move forward. He is also very excited because he now has a new "girlfriend "who the entire family lives.  He now has more emotional stability and happiness with his companion.  In doing so he is now more interested and excited about being active and exercising.  He is doing exercise at least 3 times a week.  He does exercises every other day beyond his gym exercises.  He is changed his diet to a  very heart healthy diet. The only thing that really limits him is his knee still bothers him he has not chest tightness or pressure episodes to suggest angina with either rest or exertion since I last saw him. He denies any exertional dyspnea.  No PND, orthopnea or edema. No rapid irregular heartbeats or palpitations.  No syncope/near syncope or TIA/amaurosis fugax. No claudication.  ROS: A comprehensive was performed. Review of Systems  Constitutional: Negative for malaise/fatigue and weight loss.  Respiratory: Negative for shortness of breath.   Cardiovascular:       Per history of present illness  Gastrointestinal: Negative for heartburn.  Musculoskeletal: Positive for joint pain (Left knee).  Neurological: Negative for dizziness, focal weakness and loss of consciousness.  Endo/Heme/Allergies: Does not bruise/bleed easily.  Psychiatric/Behavioral: Negative for depression and memory loss. The patient is not nervous/anxious.   All other systems reviewed and are negative.  I have reviewed and (if needed) personally updated the patient's problem list, medications, allergies, past medical and surgical history, social and family history.   Past Medical History:  Diagnosis Date  . Anemia   . Arthritis   . CAD S/P percutaneous coronary angioplasty    a. 11/2016: anterior STEMI s/p DES to LAD.   . Diabetes mellitus without complication ([place])    Diet controlled  . GERD (gastroesophageal reflux disease)   . GI bleed  10/2013: 2/2 duodenal ulcer. ASA/plavix discontinued.   . Hyperlipemia   . Hypertension   . PAF (paroxysmal atrial fibrillation) ([place])    a. noted during admission in 10/2013 for acute GI bleed. No recurrunce since. Monitor in 2014 with only PAC/PVCs  . ST elevation myocardial infarction (STEMI) involving left anterior descending (LAD) coronary artery with complication ([place]) 40/98/1191   - 99% subtotal occlusion of LAD treated with DES stent.  . Stroke [facility])    a. ichemic  CVA in 2012.     Past Surgical History:  Procedure Laterality Date  . CORONARY STENT INTERVENTION N/A 12/11/2016   Procedure: Coronary Stent Intervention-Mid LAD;  Surgeon: Leonie Man, MD;  Location: [place] CV LAB;  Service: Cardiovascular:  mLAD 99%-0% Synergy DES 2.75 x 16 (3.0 mm)  . ESOPHAGOGASTRODUODENOSCOPY N/A 09/23/2013   Procedure: ESOPHAGOGASTRODUODENOSCOPY (EGD);  Surgeon: Lafayette Dragon, MD;  Location: [facility] ENDOSCOPY;  Service: Endoscopy;  Laterality: N/A;  . ESOPHAGOGASTRODUODENOSCOPY N/A 09/25/2013   Procedure: ESOPHAGOGASTRODUODENOSCOPY (EGD);  Surgeon: Beryle Beams, MD;  Location: [facility] ENDOSCOPY;  Service: Endoscopy;  Laterality: N/A;  . ESOPHAGOGASTRODUODENOSCOPY N/A 12/18/2013   Procedure: ESOPHAGOGASTRODUODENOSCOPY (EGD);  Surgeon: Beryle Beams, MD;  Location: [facility] ENDOSCOPY;  Service: Endoscopy;  Laterality: N/A;  . LEFT HEART CATH AND CORONARY ANGIOGRAPHY N/A 12/11/2016   Procedure: Left Heart Cath and Coronary Angiography;  Surgeon: Leonie Man, MD;  Location: [place] CV LAB;  Service: Cardiovascular: LM 45%. mLAD 99% (PCI), dLAD 40% & 50%, ostD1 - 50%. Ost-prox Cx 60%, OstOM1 60% (Med Rx).  Anteroapical HK - EF 50-55%. Normal LVEDP  . TOTAL HIP ARTHROPLASTY Right 04/28/2013   Procedure: RIGHT TOTAL HIP ARTHROPLASTY ANTERIOR APPROACH;  Surgeon: Mauri Pole, MD;  Location: [facility] ORS;  Service: Orthopedics;  Laterality: Right;  . TRANSTHORACIC ECHOCARDIOGRAM  12/12/2016   Status post anterior STEMI:  Normal LV size with normal function. EF 60 deficits 5% with no regional wall motion abnormality. GR 2 DD. Mild LA dilation.    Cardiac Cath-PCI 12/11/2016: Wall Motion: EF 50-50%;    Diagnostic Diagram     Post-Intervention 2= Hypokinetic   Culprit 99% mLAD    Synergy DES 2.75 x 16 (3.0 mm) ,       Current Meds  Medication Sig  . clopidogrel (PLAVIX) 75 MG tablet Take 1 tablet (75 mg total) by mouth daily with breakfast.  . ergocalciferol (VITAMIN D2) 50000  UNITS capsule Take 50,000 Units by mouth once a week. Patient takes on Mondays  . fenofibrate 54 MG tablet Take 1 tablet (54 mg total) by mouth daily.  Marland Kitchen levothyroxine (SYNTHROID, LEVOTHROID) 50 MCG tablet Take 50 mcg by mouth daily before breakfast.  . metoprolol tartrate (LOPRESSOR) 25 MG tablet Take 1 tablet (25 mg total) by mouth 2 (two) times daily.  . nitroGLYCERIN (NITROSTAT) 0.4 MG SL tablet Place 1 tablet (0.4 mg total) under the tongue every 5 (five) minutes x 3 doses as needed for chest pain.  . pantoprazole (PROTONIX) 40 MG tablet Take 1 tablet (40 mg total) by mouth daily.  . rosuvastatin (CRESTOR) 20 MG tablet Take 20 mg by mouth every morning.   . [DISCONTINUED] aspirin 81 MG chewable tablet Chew 1 tablet (81 mg total) by mouth daily.    Allergies  Allergen Reactions  . Adhesive [Tape] Rash    Zoll pads adhesive    Social History   Socioeconomic History  . Marital status: Widowed    Spouse name: None  . Number  of children: None  . Years of education: None  . Highest education level: None  Social Needs  . Financial resource strain: None  . Food insecurity - worry: None  . Food insecurity - inability: None  . Transportation needs - medical: None  . Transportation needs - non-medical: None  Occupational History  . None  Tobacco Use  . Smoking status: Never Smoker  . Smokeless tobacco: Never Used  Substance and Sexual Activity  . Alcohol use: Yes    Alcohol/week: 1.8 oz    Types: 3 Shots of liquor per week    Comment: wine or vodka   . Drug use: No  . Sexual activity: None  Other Topics Concern  . None  Social History Narrative  . None    family history is not on file. Noncontributory based on his age and existing risk factors.  Wt Readings from Last 3 Encounters:  08/28/17 173 lb (78.5 kg)  08/12/17 172 lb (78 kg)  03/01/17 172 lb 6.4 oz (78.2 kg)    PHYSICAL EXAM BP 136/66   Pulse 66   Ht 5\' 9"  (1.753 m)   Wt 173 lb (78.5 kg)   BMI 25.55 kg/m    Physical Exam  Constitutional: He is oriented to person, place, and time. He appears well-developed and well-nourished. No distress.  Appears much younger than stated age.  HENT:  Head: Normocephalic and atraumatic.  Neck: Normal range of motion. Neck supple. No hepatojugular reflux and no JVD present. Carotid bruit is not present.  Cardiovascular: Normal rate, regular rhythm and intact distal pulses. Exam reveals no gallop and no friction rub.  No murmur heard. Nondisplaced PMI.  Pulmonary/Chest: Effort normal and breath sounds normal. No respiratory distress. He has no wheezes. He has no rales.  Abdominal: Soft. Bowel sounds are normal. He exhibits no distension. There is no tenderness. There is no rebound.  Musculoskeletal: Normal range of motion. He exhibits no edema or deformity.  Neurological: He is alert and oriented to person, place, and time.  Psychiatric: He has a normal mood and affect. His behavior is normal. Judgment and thought content normal.  Nursing note and vitals reviewed.   Adult ECG Report n/a  Other studies Reviewed: Additional studies/ records that were reviewed today include:  Recent Labs:     Lab Results  Component Value Date   WBC 9.2 12/24/2016   HGB 13.8 12/24/2016   HCT 42.7 12/24/2016   MCV 96.0 12/24/2016   PLT 307 12/24/2016   Due for labs next spring.By PCP   ASSESSMENT / PLAN: Problem List Items Addressed This Visit    Atrial fibrillation with RVR ([place]) (Chronic)    This was probably simply related to his MI.  He has not had any recurrent symptoms.  Remains on beta-blocker.  Without any recurrence I think are fine just continue with Plavix.  He has had a history of GI bleed in the past and I would be reluctant to put him on full anti-coagulation.  He would potentially be a good candidate to consider converting to aspirin plus twice daily Xarelto 2.5 mg in the future.      CAD S/P percutaneous coronary angioplasty - Primary (Chronic)     He had a DES stent to the mid LAD in the setting of an MI.  He has diffuse existing disease in the left main, circumflex and OM.  Does not seem to be physiologically significant if he does not have any recurrent symptoms I would.  Prefer to treat medically. He is on aspirin Plavix at this time, but he can stop aspirin in March.  Would be okay to hold for bleeding in the interim however. He is on Lopressor and statin.  Not requiring any soluble nitroglycerin. Continue current exercise regimen.      Dyslipidemia, goal LDL below 70 (Chronic)    He is on combination of Crestor plus fenofibrate and I think fish oil was something that he is taking intermittently.  His PCP has been following his labs and I do not have results.  However at his age of 16, I think we can be a little bit lenient.      History of GI bleed (Chronic)    No further bleeding issues.  We are not doing triple therapy with aspirin Plavix and anticoagulation.  Fact preferring to use Plavix alone at this point beyond March.  Hemoglobin has remained stable.  Would recommend that this continue to be followed by PCP.         Current medicines are reviewed at length with the patient today. (+/- concerns) n/a The following changes have been made: n/a  Patient Instructions   MEDICATION CHANGES   IF YOU HAVE ANY BLEEDING AT PRESENT TIME  MAY HOLD( STOP TAKING) ASPIRIN FOR 1 WEEK THEN RESTART.   ON November 29, 2017 STOP ASPIRIN 81 MG ALL TOGETHER.     Your physician wants you to follow-up in [place] April 2019 WITH DR [facility]. You will receive a reminder letter in the mail two months in advance. If you don't receive a letter, please call our office to schedule the follow-up appointment.   If you need a refill on your cardiac medications before your next appointment, please call your pharmacy.    Studies Ordered:   No orders of the defined types were placed in this encounter.     Glenetta Hew, M.D.,  M.S. Interventional Cardiologist   Pager # [phone] Phone # [phone] [address]. [place], [place] [address]

## 2017-08-28 NOTE — Patient Instructions (Signed)
MEDICATION CHANGES   IF YOU HAVE ANY BLEEDING AT PRESENT TIME  MAY HOLD( STOP TAKING) ASPIRIN FOR 1 WEEK THEN RESTART.   ON November 29, 2017 STOP ASPIRIN 81 MG ALL TOGETHER.     Your physician wants you to follow-up in [place] April 2019 WITH DR [facility]. You will receive a reminder letter in the mail two months in advance. If you don't receive a letter, please call our office to schedule the follow-up appointment.   If you need a refill on your cardiac medications before your next appointment, please call your pharmacy.

## 2017-08-30 ENCOUNTER — Encounter: Payer: Self-pay | Admitting: Cardiology

## 2017-08-30 NOTE — Assessment & Plan Note (Signed)
No further bleeding issues.  We are not doing triple therapy with aspirin Plavix and anticoagulation.  Fact preferring to use Plavix alone at this point beyond March.  Hemoglobin has remained stable.  Would recommend that this continue to be followed by PCP.

## 2017-08-30 NOTE — Assessment & Plan Note (Addendum)
This was probably simply related to his MI.  He has not had any recurrent symptoms.  Remains on beta-blocker.  Without any recurrence I think are fine just continue with Plavix.  He has had a history of GI bleed in the past and I would be reluctant to put him on full anti-coagulation.  He would potentially be a good candidate to consider converting to aspirin plus twice daily Xarelto 2.5 mg in the future.

## 2017-08-30 NOTE — Assessment & Plan Note (Signed)
He had a DES stent to the mid LAD in the setting of an MI.  He has diffuse existing disease in the left main, circumflex and OM.  Does not seem to be physiologically significant if he does not have any recurrent symptoms I would.  Prefer to treat medically. He is on aspirin Plavix at this time, but he can stop aspirin in March.  Would be okay to hold for bleeding in the interim however. He is on Lopressor and statin.  Not requiring any soluble nitroglycerin. Continue current exercise regimen.

## 2017-08-30 NOTE — Assessment & Plan Note (Signed)
He is on combination of Crestor plus fenofibrate and I think fish oil was something that he is taking intermittently.  His PCP has been following his labs and I do not have results.  However at his age of 11, I think we can be a little bit lenient.

## 2017-11-28 DIAGNOSIS — H52223 Regular astigmatism, bilateral: Secondary | ICD-10-CM | POA: Diagnosis not present

## 2017-11-28 DIAGNOSIS — H35033 Hypertensive retinopathy, bilateral: Secondary | ICD-10-CM | POA: Diagnosis not present

## 2017-11-28 DIAGNOSIS — E119 Type 2 diabetes mellitus without complications: Secondary | ICD-10-CM | POA: Diagnosis not present

## 2017-11-28 DIAGNOSIS — H524 Presbyopia: Secondary | ICD-10-CM | POA: Diagnosis not present

## 2017-11-28 DIAGNOSIS — H26493 Other secondary cataract, bilateral: Secondary | ICD-10-CM | POA: Diagnosis not present

## 2017-12-09 ENCOUNTER — Other Ambulatory Visit: Payer: Self-pay | Admitting: Physician Assistant

## 2017-12-24 DIAGNOSIS — N08 Glomerular disorders in diseases classified elsewhere: Secondary | ICD-10-CM | POA: Diagnosis not present

## 2017-12-24 DIAGNOSIS — E1165 Type 2 diabetes mellitus with hyperglycemia: Secondary | ICD-10-CM | POA: Diagnosis not present

## 2017-12-24 DIAGNOSIS — E038 Other specified hypothyroidism: Secondary | ICD-10-CM | POA: Diagnosis not present

## 2017-12-24 DIAGNOSIS — I639 Cerebral infarction, unspecified: Secondary | ICD-10-CM | POA: Diagnosis not present

## 2017-12-24 DIAGNOSIS — E1129 Type 2 diabetes mellitus with other diabetic kidney complication: Secondary | ICD-10-CM | POA: Diagnosis not present

## 2017-12-24 DIAGNOSIS — I4891 Unspecified atrial fibrillation: Secondary | ICD-10-CM | POA: Diagnosis not present

## 2017-12-24 DIAGNOSIS — I10 Essential (primary) hypertension: Secondary | ICD-10-CM | POA: Diagnosis not present

## 2017-12-24 DIAGNOSIS — I2102 ST elevation (STEMI) myocardial infarction involving left anterior descending coronary artery: Secondary | ICD-10-CM | POA: Diagnosis not present

## 2017-12-24 DIAGNOSIS — I6529 Occlusion and stenosis of unspecified carotid artery: Secondary | ICD-10-CM | POA: Diagnosis not present

## 2017-12-24 DIAGNOSIS — E559 Vitamin D deficiency, unspecified: Secondary | ICD-10-CM | POA: Diagnosis not present

## 2017-12-24 DIAGNOSIS — Z1389 Encounter for screening for other disorder: Secondary | ICD-10-CM | POA: Diagnosis not present

## 2017-12-24 DIAGNOSIS — E7849 Other hyperlipidemia: Secondary | ICD-10-CM | POA: Diagnosis not present

## 2017-12-27 ENCOUNTER — Encounter: Payer: Self-pay | Admitting: Cardiology

## 2017-12-27 ENCOUNTER — Ambulatory Visit (INDEPENDENT_AMBULATORY_CARE_PROVIDER_SITE_OTHER): Payer: Medicare Other | Admitting: Cardiology

## 2017-12-27 VITALS — BP 148/60 | HR 61 | Ht 69.0 in | Wt 171.0 lb

## 2017-12-27 DIAGNOSIS — I4891 Unspecified atrial fibrillation: Secondary | ICD-10-CM

## 2017-12-27 DIAGNOSIS — E785 Hyperlipidemia, unspecified: Secondary | ICD-10-CM

## 2017-12-27 DIAGNOSIS — I2102 ST elevation (STEMI) myocardial infarction involving left anterior descending coronary artery: Secondary | ICD-10-CM | POA: Diagnosis not present

## 2017-12-27 DIAGNOSIS — Z9861 Coronary angioplasty status: Secondary | ICD-10-CM

## 2017-12-27 DIAGNOSIS — I2510 Atherosclerotic heart disease of native coronary artery without angina pectoris: Secondary | ICD-10-CM

## 2017-12-27 NOTE — Progress Notes (Signed)
PCP: Reynold Bowen, MD  Clinic Note: Chief Complaint  Patient presents with  . Follow-up  . Coronary Artery Disease    HPI: [name] is a 84 y.o. male with a PMH below who presents today for 27-month follow-up for an anniversary evaluation following his anterior STEMI -CAD-PCI of LAD.Marland Kitchen He had an anterior STEMI in March 2018. He has a history of a major GI bleed requiring transfusion in 2014 &  Ischemic stroke in 2012 with carotid artery disease.  He has hypertension and diet-controlled diabetes.  [name] was last seen back in Nov 2018.-Was doing very well at that time.  Just started cardiac rehab.  I increased his metoprolol to 25 twice daily.  Recent Hospitalizations:   He had an emergency room visit after having a skin tag removal.  He had his persistent profuse bleeding that finally had to have cauterization to stop.  Did not require transfusion.  Studies Personally Reviewed - (if available, images/films reviewed: From Epic Chart or Care Everywhere)  No new studies no new studies no new studies no new studies  Interval History: [name] presents today doing quite with no major cardiac complaints.  He is in great spirits, looks great for his age.  He stopped his aspirin as instructed to begin this month.  He thoroughly enjoyed his cardiac rehab and is said to stop his therapy.  He would love to be out of continue the maintenance program. He remains active with doing exercises that he had started during his rehab, but she states is not as fun.  He has been a little bit bothered by his left knee pain and swelling from arthritis, but remains in good spirits, and otherwise healthy.  He denies any recurrence of his anginal chest tightness or pressure with either rest or exertion.  No resting or exertional dyspnea.    Enjoyed [place] - would like to do maintenance.  Cardiovascular ROS: no chest pain or dyspnea on exertion positive for - easy bleeding negative for - edema, irregular  heartbeat, orthopnea, palpitations, paroxysmal nocturnal dyspnea, rapid heart rate, shortness of breath or syncope/near syncope, TIA/amaurosis fugax. claudication.   ROS: A comprehensive was performed. Review of Systems  Constitutional: Negative for malaise/fatigue and weight loss.  Respiratory: Negative for shortness of breath.   Cardiovascular:       Per history of present illness  Gastrointestinal: Negative for heartburn.  Musculoskeletal: Positive for joint pain (Left knee).  Neurological: Negative for dizziness, focal weakness and loss of consciousness.  Endo/Heme/Allergies: Does not bruise/bleed easily.  Psychiatric/Behavioral: Negative for depression and memory loss. The patient is not nervous/anxious.   All other systems reviewed and are negative.  I have reviewed and (if needed) personally updated the patient's problem list, medications, allergies, past medical and surgical history, social and family history.   Past Medical History:  Diagnosis Date  . Anemia   . Arthritis   . CAD S/P percutaneous coronary angioplasty    a. 11/2016: anterior STEMI s/p DES to LAD.   . Diabetes mellitus without complication ([place])    Diet controlled  . GERD (gastroesophageal reflux disease)   . GI bleed    10/2013: 2/2 duodenal ulcer. ASA/plavix discontinued.   . Hyperlipemia   . Hypertension   . PAF (paroxysmal atrial fibrillation) ([place])    a. noted during admission in 10/2013 for acute GI bleed. No recurrunce since. Monitor in 2014 with only PAC/PVCs  . ST elevation myocardial infarction (STEMI) involving left anterior descending (LAD)  coronary artery with complication ([place]) 81/19/1478   - 99% subtotal occlusion of LAD treated with DES stent.  . Stroke [facility])    a. ichemic CVA in 2012.     Past Surgical History:  Procedure Laterality Date  . CORONARY STENT INTERVENTION N/A 12/11/2016   Procedure: Coronary Stent Intervention-Mid LAD;  Surgeon: Leonie Man, MD;  Location: [place] [facility]  [facility];  Service: Cardiovascular:  mLAD 99%-0% Synergy DES 2.75 x 16 (3.0 mm)  . ESOPHAGOGASTRODUODENOSCOPY N/A 09/23/2013   Procedure: ESOPHAGOGASTRODUODENOSCOPY (EGD);  Surgeon: Lafayette Dragon, MD;  Location: [facility] ENDOSCOPY;  Service: Endoscopy;  Laterality: N/A;  . ESOPHAGOGASTRODUODENOSCOPY N/A 09/25/2013   Procedure: ESOPHAGOGASTRODUODENOSCOPY (EGD);  Surgeon: Beryle Beams, MD;  Location: [facility] ENDOSCOPY;  Service: Endoscopy;  Laterality: N/A;  . ESOPHAGOGASTRODUODENOSCOPY N/A 12/18/2013   Procedure: ESOPHAGOGASTRODUODENOSCOPY (EGD);  Surgeon: Beryle Beams, MD;  Location: [facility];  Service: Endoscopy;  Laterality: N/A;  . LEFT HEART CATH AND CORONARY ANGIOGRAPHY N/A 12/11/2016   Procedure: Left Heart Cath and Coronary Angiography;  Surgeon: Leonie Man, MD;  Location: [place] [facility];  Service: Cardiovascular: LM 45%. mLAD 99% (PCI), dLAD 40% & 50%, ostD1 - 50%. Ost-prox Cx 60%, OstOM1 60% (Med Rx).  Anteroapical HK - EF 50-55%. Normal LVEDP  . TOTAL HIP ARTHROPLASTY Right 04/28/2013   Procedure: RIGHT TOTAL HIP ARTHROPLASTY ANTERIOR APPROACH;  Surgeon: Mauri Pole, MD;  Location: [facility] ORS;  Service: Orthopedics;  Laterality: Right;  . TRANSTHORACIC ECHOCARDIOGRAM  12/12/2016   Status post anterior STEMI:  Normal LV size with normal function. EF 60 deficits 5% with no regional wall motion abnormality. GR 2 DD. Mild LA dilation.    Cardiac Cath-PCI 12/11/2016: Wall Motion: EF 50-50%;    Diagnostic Diagram     Post-Intervention 2= Hypokinetic   Culprit 99% mLAD    Synergy DES 2.75 x 16 (3.0 mm) ,       Current Meds  Medication Sig  . clopidogrel (PLAVIX) 75 MG tablet Take 1 tablet (75 mg total) by mouth daily with breakfast.  . ergocalciferol (VITAMIN D2) 50000 UNITS capsule Take 50,000 Units by mouth once a week. Patient takes on Mondays  . fenofibrate 54 MG tablet Take 1 tablet (54 mg total) by mouth daily.  Marland Kitchen levothyroxine (SYNTHROID, LEVOTHROID) 50 MCG tablet Take 50 mcg by mouth  daily before breakfast.  . metoprolol tartrate (LOPRESSOR) 25 MG tablet Take 1 tablet (25 mg total) by mouth 2 (two) times daily.  . nitroGLYCERIN (NITROSTAT) 0.4 MG SL tablet Place 1 tablet (0.4 mg total) under the tongue every 5 (five) minutes x 3 doses as needed for chest pain.  . pantoprazole (PROTONIX) 40 MG tablet TAKE 1 TABLET ONCE DAILY.  . rosuvastatin (CRESTOR) 20 MG tablet Take 20 mg by mouth every morning.     Allergies  Allergen Reactions  . Adhesive [Tape] Rash    Zoll pads adhesive    Social History   Tobacco Use  . Smoking status: Never Smoker  . Smokeless tobacco: Never Used  Substance Use Topics  . Alcohol use: Yes    Alcohol/week: 1.8 oz    Types: 3 Shots of liquor per week    Comment: wine or vodka   . Drug use: No   Social History   Social History Narrative  . Not on file    family history is not on file. Noncontributory based on his age and existing risk factors.  Wt Readings from Last 3 Encounters:  12/27/17  171 lb (77.6 kg)  08/28/17 173 lb (78.5 kg)  08/12/17 172 lb (78 kg)    PHYSICAL EXAM BP (!) 148/60 (BP Location: Left Arm, Patient Position: Sitting, Cuff Size: Normal)   Pulse 61   Ht 5\' 9"  (1.753 m)   Wt 171 lb (77.6 kg)   BMI 25.25 kg/m   Physical Exam  Constitutional: He is oriented to person, place, and time. He appears well-developed and well-nourished. No distress.  Appears much younger than stated age.  HENT:  Head: Normocephalic and atraumatic.  Neck: Normal range of motion. Neck supple. No hepatojugular reflux and no JVD present. Carotid bruit is not present.  Cardiovascular: Normal rate, regular rhythm and intact distal pulses. Exam reveals no gallop and no friction rub.  No murmur heard. Nondisplaced PMI.  Pulmonary/Chest: Effort normal and breath sounds normal. No respiratory distress. He has no wheezes. He has no rales.  Abdominal: Soft. Bowel sounds are normal. He exhibits no distension. There is no tenderness. There  is no rebound.  Musculoskeletal: Normal range of motion. He exhibits no edema or deformity.  Neurological: He is alert and oriented to person, place, and time.  Psychiatric: He has a normal mood and affect. His behavior is normal. Judgment and thought content normal.  Nursing note and vitals reviewed.   Adult ECG Report  Rate: 61 ;  Rhythm: normal sinus rhythm;  1st Deg AVB (216), otherwise normal axis, intervals and durations.  Narrative Interpretation: Relatively normal EKG   Other studies Reviewed: Additional studies/ records that were reviewed today include:  Recent Labs:    Last check from June 2018: Total cholesterol 115, triglycerides 275, HDL 31 LDL 29.  Due for recheck soon.  ASSESSMENT / PLAN: Problem List Items Addressed This Visit    ST elevation myocardial infarction (STEMI) involving left anterior descending (LAD) coronary artery without development of Q waves (HCC) (Chronic)    Despite anterior MI,   No recurrence of angina or chest pain. Refer back to cardiac rehab maintenance.        Relevant Orders   EKG 12-Lead   AMB referral to cardiac rehabilitation   Dyslipidemia, goal LDL below 70 (Chronic)    Due for labs to be checked by PCP.  For now continue statin and fenofibrate -lipids look great last June. Since his triglycerides have been high, I agree with fenofibrate, but may be able to back down on the Crestor dose.      CAD S/P percutaneous coronary angioplasty - Primary (Chronic)    Doing well status post DES PCI of the LAD in setting of an anterior MI.  Doing very well with his existing disease in the left main, circumflex OM bifurcation with no recurrent anginal symptoms.    Plan: Continue Plavix without aspirin.  Low-dose beta-blocker and statin plus fenofibrate. --Will recommend maintenance Cardiac Rehab      Relevant Orders   EKG 12-Lead   AMB referral to cardiac rehabilitation   Atrial fibrillation with RVR (HCC) (Chronic)    He had A. fib in the  setting of anterior MI, but no recurrent episodes.  Remains on beta-blocker. For now we will simply continue Plavix, but if there is any signs of recurrence, may consider switching him to  aspirin plus twice daily low-dose Xarelto.      Relevant Orders   EKG 12-Lead      Current medicines are reviewed at length with the patient today. (+/- concerns) n/a The following changes have been made: n/a  Patient Instructions  NO CHANGES WITH MEDICATIONS  IF YOU HAVE SOME INCREASE BRUISING OR BLEEDING - YOU MAY HOLD PLAVIX 3 TO 4 DAY.  OKAY TO RETURN TO CARDIAC REHAB - MAINTENANCE III  AT HEART STRIDE.   Your physician wants you to follow-up in [place]. You will receive a reminder letter in the mail two months in advance. If you don't receive a letter, please call our office to schedule the follow-up appointment.  If you need a refill on your cardiac medications before your next appointment, please call your pharmacy.    Studies Ordered:   Orders Placed This Encounter  Procedures  . AMB referral to cardiac rehabilitation  . EKG 12-Lead      Glenetta Hew, M.D., M.S. Interventional Cardiologist   Pager # [phone] Phone # [phone] [address] [place],  [address]

## 2017-12-27 NOTE — Patient Instructions (Addendum)
NO CHANGES WITH MEDICATIONS  IF YOU HAVE SOME INCREASE BRUISING OR BLEEDING - YOU MAY HOLD PLAVIX 3 TO 4 DAY.  OKAY TO RETURN TO CARDIAC REHAB - MAINTENANCE III  AT HEART STRIDE.   Your physician wants you to follow-up in [place]. You will receive a reminder letter in the mail two months in advance. If you don't receive a letter, please call our office to schedule the follow-up appointment.  If you need a refill on your cardiac medications before your next appointment, please call your pharmacy.

## 2017-12-29 ENCOUNTER — Encounter: Payer: Self-pay | Admitting: Cardiology

## 2017-12-29 NOTE — Assessment & Plan Note (Signed)
Despite anterior MI,   No recurrence of angina or chest pain. Refer back to cardiac rehab maintenance.

## 2017-12-29 NOTE — Assessment & Plan Note (Addendum)
Doing well status post DES PCI of the LAD in setting of an anterior MI.  Doing very well with his existing disease in the left main, circumflex OM bifurcation with no recurrent anginal symptoms.    Plan: Continue Plavix without aspirin.  Low-dose beta-blocker and statin plus fenofibrate. --Will recommend maintenance Cardiac Rehab

## 2017-12-29 NOTE — Assessment & Plan Note (Addendum)
Due for labs to be checked by PCP.  For now continue statin and fenofibrate -lipids look great last June. Since his triglycerides have been high, I agree with fenofibrate, but may be able to back down on the Crestor dose.

## 2017-12-29 NOTE — Assessment & Plan Note (Signed)
He had A. fib in the setting of anterior MI, but no recurrent episodes.  Remains on beta-blocker. For now we will simply continue Plavix, but if there is any signs of recurrence, may consider switching him to  aspirin plus twice daily low-dose Xarelto.

## 2018-01-01 ENCOUNTER — Telehealth: Payer: Self-pay | Admitting: *Deleted

## 2018-01-01 NOTE — Telephone Encounter (Signed)
Cardiac rehab referral form for phase 3 signed. It was sent with demographics, last office note

## 2018-02-06 DIAGNOSIS — Z85828 Personal history of other malignant neoplasm of skin: Secondary | ICD-10-CM | POA: Diagnosis not present

## 2018-02-06 DIAGNOSIS — L570 Actinic keratosis: Secondary | ICD-10-CM | POA: Diagnosis not present

## 2018-02-06 DIAGNOSIS — D485 Neoplasm of uncertain behavior of skin: Secondary | ICD-10-CM | POA: Diagnosis not present

## 2018-02-06 DIAGNOSIS — C44319 Basal cell carcinoma of skin of other parts of face: Secondary | ICD-10-CM | POA: Diagnosis not present

## 2018-03-08 ENCOUNTER — Other Ambulatory Visit: Payer: Self-pay | Admitting: Cardiology

## 2018-03-17 ENCOUNTER — Other Ambulatory Visit: Payer: Self-pay | Admitting: Physician Assistant

## 2018-03-25 DIAGNOSIS — I2102 ST elevation (STEMI) myocardial infarction involving left anterior descending coronary artery: Secondary | ICD-10-CM | POA: Diagnosis not present

## 2018-03-25 DIAGNOSIS — E559 Vitamin D deficiency, unspecified: Secondary | ICD-10-CM | POA: Diagnosis not present

## 2018-03-25 DIAGNOSIS — I6389 Other cerebral infarction: Secondary | ICD-10-CM | POA: Diagnosis not present

## 2018-03-25 DIAGNOSIS — I4891 Unspecified atrial fibrillation: Secondary | ICD-10-CM | POA: Diagnosis not present

## 2018-03-25 DIAGNOSIS — E1129 Type 2 diabetes mellitus with other diabetic kidney complication: Secondary | ICD-10-CM | POA: Diagnosis not present

## 2018-03-25 DIAGNOSIS — E7849 Other hyperlipidemia: Secondary | ICD-10-CM | POA: Diagnosis not present

## 2018-03-25 DIAGNOSIS — E038 Other specified hypothyroidism: Secondary | ICD-10-CM | POA: Diagnosis not present

## 2018-03-25 DIAGNOSIS — Z6825 Body mass index (BMI) 25.0-25.9, adult: Secondary | ICD-10-CM | POA: Diagnosis not present

## 2018-03-25 DIAGNOSIS — R2689 Other abnormalities of gait and mobility: Secondary | ICD-10-CM | POA: Diagnosis not present

## 2018-03-25 DIAGNOSIS — N183 Chronic kidney disease, stage 3 (moderate): Secondary | ICD-10-CM | POA: Diagnosis not present

## 2018-03-25 DIAGNOSIS — I10 Essential (primary) hypertension: Secondary | ICD-10-CM | POA: Diagnosis not present

## 2018-03-25 DIAGNOSIS — Z1389 Encounter for screening for other disorder: Secondary | ICD-10-CM | POA: Diagnosis not present

## 2018-06-10 DIAGNOSIS — Z85828 Personal history of other malignant neoplasm of skin: Secondary | ICD-10-CM | POA: Diagnosis not present

## 2018-06-10 DIAGNOSIS — L570 Actinic keratosis: Secondary | ICD-10-CM | POA: Diagnosis not present

## 2018-06-10 DIAGNOSIS — D485 Neoplasm of uncertain behavior of skin: Secondary | ICD-10-CM | POA: Diagnosis not present

## 2018-06-10 DIAGNOSIS — C44319 Basal cell carcinoma of skin of other parts of face: Secondary | ICD-10-CM | POA: Diagnosis not present

## 2018-06-16 ENCOUNTER — Ambulatory Visit (INDEPENDENT_AMBULATORY_CARE_PROVIDER_SITE_OTHER): Payer: Medicare Other | Admitting: Cardiology

## 2018-06-16 ENCOUNTER — Encounter: Payer: Self-pay | Admitting: Cardiology

## 2018-06-16 VITALS — BP 150/73 | HR 65 | Ht 69.0 in | Wt 166.4 lb

## 2018-06-16 DIAGNOSIS — Z9861 Coronary angioplasty status: Secondary | ICD-10-CM

## 2018-06-16 DIAGNOSIS — I10 Essential (primary) hypertension: Secondary | ICD-10-CM

## 2018-06-16 DIAGNOSIS — I4891 Unspecified atrial fibrillation: Secondary | ICD-10-CM | POA: Diagnosis not present

## 2018-06-16 DIAGNOSIS — I2510 Atherosclerotic heart disease of native coronary artery without angina pectoris: Secondary | ICD-10-CM

## 2018-06-16 DIAGNOSIS — E785 Hyperlipidemia, unspecified: Secondary | ICD-10-CM

## 2018-06-16 NOTE — Patient Instructions (Signed)
Dr [name] recommends that you schedule a follow-up appointment in 6 months. You will receive a reminder letter in the mail two months in advance. If you don't receive a letter, please call our office to schedule the follow-up appointment.  If you need a refill on your cardiac medications before your next appointment, please call your pharmacy.

## 2018-06-16 NOTE — Progress Notes (Signed)
,  PCP: Reynold Bowen, MD  Clinic Note: Chief Complaint  Patient presents with  . Follow-up    pt reports no complaints  . Coronary Artery Disease    HPI: [name] is a 84 y.o. male with a PMH notable for Anterior STEMI -CAD-PCI of LAD who presents today for 6 month follow-up. S/p  Anterior STEMI in March 2018. He has a history of a major GI bleed requiring transfusion in 2014 &  Ischemic stroke in 2012 with carotid artery disease.  He has hypertension and diet-controlled diabetes.  [name] was last seen back in March 2019  -Was doing very well at that time.  Just started cardiac rehab.  I increased his metoprolol to 25 twice daily.  He had some bleeding when he had a skin tag removal that required cauterization, but otherwise had no active issues with cardiac problems.  Recent Hospitalizations:   He had an emergency room visit after having a skin tag removal.  He had his persistent profuse bleeding that finally had to have cauterization to stop.  Did not require transfusion.  Studies Personally Reviewed - (if available, images/films reviewed: From Epic Chart or Care Everywhere)  No new studies no new studies  Interval History: [name] is an amazing man who does not at all appear to be 84 years old.  He continues to be fully active.  He is in great spirits as usual, making jokes.  He has not had any further bleeding episodes, but still has bruising.  For the most part this did improve after stopping his aspirin. He really has no cardiac complaints and is doing outstanding.  He still goes to the gym and walks routinely.  He denies any recurrent chest tightness pressure with rest or exertion.  He denies any exertional dyspnea.  No PND, orthopnea with mild end of day edema. He denies any rapid irregular heartbeats palpitations.  No syncope/near syncope or TIA/amaurosis fugax.  No myalgias.   ROS: A comprehensive was performed. Review of Systems  Constitutional: Negative for  malaise/fatigue and weight loss.  HENT: Negative for nosebleeds.   Respiratory: Negative for cough and shortness of breath. Wheezing:     Cardiovascular:       Per history of present illness  Gastrointestinal: Negative for blood in stool, heartburn and melena.  Genitourinary: Negative for hematuria.  Musculoskeletal: Positive for joint pain (Left knee).  Neurological: Negative for dizziness, focal weakness and loss of consciousness.  Endo/Heme/Allergies: Does not bruise/bleed easily.  Psychiatric/Behavioral: Negative for depression and memory loss. The patient is not nervous/anxious.   All other systems reviewed and are negative.  I have reviewed and (if needed) personally updated the patient's problem list, medications, allergies, past medical and surgical history, social and family history.   Past Medical History:  Diagnosis Date  . Anemia   . Arthritis   . CAD S/P percutaneous coronary angioplasty    a. 11/2016: anterior STEMI s/p DES to LAD.   . Diabetes mellitus without complication ([place])    Diet controlled  . GERD (gastroesophageal reflux disease)   . GI bleed    10/2013: 2/2 duodenal ulcer. ASA/plavix discontinued.   . Hyperlipemia   . Hypertension   . PAF (paroxysmal atrial fibrillation) ([place])    a. noted during admission in 10/2013 for acute GI bleed. No recurrunce since. Monitor in 2014 with only PAC/PVCs  . ST elevation myocardial infarction (STEMI) involving left anterior descending (LAD) coronary artery with complication ([place]) 43/15/4008   -  99% subtotal occlusion of LAD treated with DES stent.  . Stroke [facility])    a. ichemic CVA in 2012.     Past Surgical History:  Procedure Laterality Date  . CORONARY STENT INTERVENTION N/A 12/11/2016   Procedure: Coronary Stent Intervention-Mid LAD;  Surgeon: Leonie Man, MD;  Location: [place] [facility];  Service: Cardiovascular:  mLAD 99%-0% Synergy DES 2.75 x 16 (3.0 mm)  . ESOPHAGOGASTRODUODENOSCOPY N/A 09/23/2013    Procedure: ESOPHAGOGASTRODUODENOSCOPY (EGD);  Surgeon: Lafayette Dragon, MD;  Location: [facility] ENDOSCOPY;  Service: Endoscopy;  Laterality: N/A;  . ESOPHAGOGASTRODUODENOSCOPY N/A 09/25/2013   Procedure: ESOPHAGOGASTRODUODENOSCOPY (EGD);  Surgeon: Beryle Beams, MD;  Location[facility] ENDOSCOPY;  Service: Endoscopy;  Laterality: N/A;  . ESOPHAGOGASTRODUODENOSCOPY N/A 12/18/2013   Procedure: ESOPHAGOGASTRODUODENOSCOPY (EGD);  Surgeon: Beryle Beams, MD;  Location: [facility];  Service: Endoscopy;  Laterality: N/A;  . LEFT HEART CATH AND CORONARY ANGIOGRAPHY N/A 12/11/2016   Procedure: Left Heart Cath and Coronary Angiography;  Surgeon: Leonie Man, MD;  Location: [place] [facility];  Service: Cardiovascular: LM 45%. mLAD 99% (PCI), dLAD 40% & 50%, ostD1 - 50%. Ost-prox Cx 60%, OstOM1 60% (Med Rx).  Anteroapical HK - EF 50-55%. Normal LVEDP  . TOTAL HIP ARTHROPLASTY Right 04/28/2013   Procedure: RIGHT TOTAL HIP ARTHROPLASTY ANTERIOR APPROACH;  Surgeon: Mauri Pole, MD;  Location: [facility];  Service: Orthopedics;  Laterality: Right;  . TRANSTHORACIC ECHOCARDIOGRAM  12/12/2016   Status post anterior STEMI:  Normal LV size with normal function. EF 60 deficits 5% with no regional wall motion abnormality. GR 2 DD. Mild LA dilation.     Cardiac Cath-PCI 12/11/2016: Wall Motion: EF 50-50%;   Diagnostic Diagram     Post-Intervention 2= Hypokinetic Apex, Ant Apex Culprit 99% mLAD - Synergy DES 2.75 x 16 (3.0 mm) ,      Current Meds  Medication Sig  . clopidogrel (PLAVIX) 75 MG tablet TAKE 1 TABLET EVERY DAY WITH BREAKFAST.  Marland Kitchen ergocalciferol (VITAMIN D2) 50000 UNITS capsule Take 50,000 Units by mouth once a week. Patient takes on Mondays  . fenofibrate 54 MG tablet Take 1 tablet (54 mg total) by mouth daily.  Marland Kitchen glimepiride (AMARYL) 2 MG tablet Take 1 tablet by mouth daily.  Marland Kitchen LANTUS SOLOSTAR 100 UNIT/ML Solostar Pen Inject 10 Units into the skin daily.  Marland Kitchen levothyroxine (SYNTHROID, LEVOTHROID) 50 MCG tablet  Take 50 mcg by mouth daily before breakfast.  . metoprolol tartrate (LOPRESSOR) 25 MG tablet TAKE 1 TABLET BY MOUTH TWICE DAILY.  . nitroGLYCERIN (NITROSTAT) 0.4 MG SL tablet Place 1 tablet (0.4 mg total) under the tongue every 5 (five) minutes x 3 doses as needed for chest pain.  . pantoprazole (PROTONIX) 40 MG tablet TAKE 1 TABLET ONCE DAILY.  . rosuvastatin (CRESTOR) 20 MG tablet Take 20 mg by mouth every morning.     Allergies  Allergen Reactions  . Adhesive [Tape] Rash    Zoll pads adhesive    Social History   Tobacco Use  . Smoking status: Never Smoker  . Smokeless tobacco: Never Used  Substance Use Topics  . Alcohol use: Yes    Alcohol/week: 3.0 standard drinks    Types: 3 Shots of liquor per week    Comment: wine or vodka   . Drug use: No   Social History   Social History Narrative  . Not on file    family history is not on file. Noncontributory based on his age and existing risk factors.  Wt Readings from Last 3 Encounters:  06/16/18 166 lb 6.4 oz (75.5 kg)  12/27/17 171 lb (77.6 kg)  08/28/17 173 lb (78.5 kg)    PHYSICAL EXAM BP (!) 150/73   Pulse 65   Ht 5\' 9"  (1.753 m)   Wt 166 lb 6.4 oz (75.5 kg)   BMI 24.57 kg/m   Physical Exam  Constitutional: He is oriented to person, place, and time. He appears well-developed and well-nourished. No distress.  Appears much younger than stated age.  HENT:  Head: Normocephalic and atraumatic.  Neck: Normal range of motion. Neck supple. No hepatojugular reflux and no JVD present. Carotid bruit is not present.  Cardiovascular: Normal rate, regular rhythm, normal heart sounds and intact distal pulses.  No extrasystoles are present. PMI is not displaced. Exam reveals no gallop and no friction rub.  No murmur heard. Nondisplaced PMI.  Pulmonary/Chest: Effort normal and breath sounds normal. No respiratory distress. He has no wheezes. He has no rales.  Musculoskeletal: Normal range of motion. He exhibits no edema or  deformity.  Neurological: He is alert and oriented to person, place, and time.  Psychiatric: He has a normal mood and affect. His behavior is normal. Judgment and thought content normal.  Nursing note and vitals reviewed.   Adult ECG Report  Other studies Reviewed: Additional studies/ records that were reviewed today include:  Recent Labs:    Last check from June 2019: Total cholesterol 127, triglycerides 360, HDL 28, LDL 27.  A1c 8.  Creatinine 1.3.  ASSESSMENT / PLAN: Problem List Items Addressed This Visit    Atrial fibrillation with RVR (HCC) (Chronic)    No recurrent episodes since his MI.  It may very well have been ischemia mediated and therefore I would prefer to hold off on anticoagulation.  We will simply just continue Plavix along the lesser signs of recurrence.      CAD S/P percutaneous coronary angioplasty - Primary (Chronic)    He continues to be doing outstandingly well after his PCI.  No recurrent anginal symptoms.  He is on low-dose beta-blocker and tolerating it well.  He is on stable dose of Crestor.Marland Kitchen  He is on Plavix alone which I would like to continue based on it being an LAD lesion.  As long as he not having bleeding.  He is far enough out where we could hold it if necessary for either bleeding or procedures.       Dyslipidemia, goal LDL below 70 (Chronic)    Just checked by PCP.  All of his labs are low.  He is on fenofibrate because of high triglycerides.  Triglycerides are still elevated, not overly high.  We talked about dietary adjustment that may help.      Essential hypertension (Chronic)    Blood pressure is little high, but since he is doing so well I am leery of being more aggressive and then potentially causing him to be more fatigued.  At this point we will continue to monitor, but low threshold in the future to consider adding ARB.         Current medicines are reviewed at length with the patient today. (+/- concerns) n/a The following  changes have been made: n/a  Patient Instructions  Dr [name] recommends that you schedule a follow-up appointment in 6 months. You will receive a reminder letter in the mail two months in advance. If you don't receive a letter, please call our office to schedule the follow-up appointment.  If  you need a refill on your cardiac medications before your next appointment, please call your pharmacy.   Studies Ordered:   No orders of the defined types were placed in this encounter.     Glenetta Hew, M.D., M.S. Interventional Cardiologist   Pager # [phone] Phone # [phone] [address] [place], [place] [address]

## 2018-06-18 ENCOUNTER — Encounter: Payer: Self-pay | Admitting: Cardiology

## 2018-06-18 NOTE — Assessment & Plan Note (Signed)
He continues to be doing outstandingly well after his PCI.  No recurrent anginal symptoms.  He is on low-dose beta-blocker and tolerating it well.  He is on stable dose of Crestor.Marland Kitchen  He is on Plavix alone which I would like to continue based on it being an LAD lesion.  As long as he not having bleeding.  He is far enough out where we could hold it if necessary for either bleeding or procedures.

## 2018-06-18 NOTE — Assessment & Plan Note (Signed)
Blood pressure is little high, but since he is doing so well I am leery of being more aggressive and then potentially causing him to be more fatigued.  At this point we will continue to monitor, but low threshold in the future to consider adding ARB.

## 2018-06-18 NOTE — Assessment & Plan Note (Addendum)
No recurrent episodes since his MI.  It may very well have been ischemia mediated and therefore I would prefer to hold off on anticoagulation.  We will simply just continue Plavix along the lesser signs of recurrence.

## 2018-06-18 NOTE — Assessment & Plan Note (Signed)
Just checked by PCP.  All of his labs are low.  He is on fenofibrate because of high triglycerides.  Triglycerides are still elevated, not overly high.  We talked about dietary adjustment that may help.

## 2018-08-27 DIAGNOSIS — N183 Chronic kidney disease, stage 3 (moderate): Secondary | ICD-10-CM | POA: Diagnosis not present

## 2018-08-27 DIAGNOSIS — R300 Dysuria: Secondary | ICD-10-CM | POA: Diagnosis not present

## 2018-08-27 DIAGNOSIS — N390 Urinary tract infection, site not specified: Secondary | ICD-10-CM | POA: Diagnosis not present

## 2018-08-27 DIAGNOSIS — E559 Vitamin D deficiency, unspecified: Secondary | ICD-10-CM | POA: Diagnosis not present

## 2018-08-27 DIAGNOSIS — I4891 Unspecified atrial fibrillation: Secondary | ICD-10-CM | POA: Diagnosis not present

## 2018-08-27 DIAGNOSIS — E1129 Type 2 diabetes mellitus with other diabetic kidney complication: Secondary | ICD-10-CM | POA: Diagnosis not present

## 2018-08-27 DIAGNOSIS — I2510 Atherosclerotic heart disease of native coronary artery without angina pectoris: Secondary | ICD-10-CM | POA: Diagnosis not present

## 2018-08-27 DIAGNOSIS — Z794 Long term (current) use of insulin: Secondary | ICD-10-CM | POA: Diagnosis not present

## 2018-08-27 DIAGNOSIS — I6529 Occlusion and stenosis of unspecified carotid artery: Secondary | ICD-10-CM | POA: Diagnosis not present

## 2018-08-27 DIAGNOSIS — R269 Unspecified abnormalities of gait and mobility: Secondary | ICD-10-CM | POA: Diagnosis not present

## 2018-08-27 DIAGNOSIS — E7849 Other hyperlipidemia: Secondary | ICD-10-CM | POA: Diagnosis not present

## 2018-08-27 DIAGNOSIS — I10 Essential (primary) hypertension: Secondary | ICD-10-CM | POA: Diagnosis not present

## 2018-08-27 DIAGNOSIS — E038 Other specified hypothyroidism: Secondary | ICD-10-CM | POA: Diagnosis not present

## 2018-10-10 DIAGNOSIS — Z85828 Personal history of other malignant neoplasm of skin: Secondary | ICD-10-CM | POA: Diagnosis not present

## 2018-10-10 DIAGNOSIS — L821 Other seborrheic keratosis: Secondary | ICD-10-CM | POA: Diagnosis not present

## 2018-10-10 DIAGNOSIS — L570 Actinic keratosis: Secondary | ICD-10-CM | POA: Diagnosis not present

## 2018-12-04 DIAGNOSIS — H524 Presbyopia: Secondary | ICD-10-CM | POA: Diagnosis not present

## 2018-12-04 DIAGNOSIS — H35033 Hypertensive retinopathy, bilateral: Secondary | ICD-10-CM | POA: Diagnosis not present

## 2018-12-04 DIAGNOSIS — E119 Type 2 diabetes mellitus without complications: Secondary | ICD-10-CM | POA: Diagnosis not present

## 2018-12-04 DIAGNOSIS — H52223 Regular astigmatism, bilateral: Secondary | ICD-10-CM | POA: Diagnosis not present

## 2018-12-04 DIAGNOSIS — H26493 Other secondary cataract, bilateral: Secondary | ICD-10-CM | POA: Diagnosis not present

## 2018-12-08 ENCOUNTER — Ambulatory Visit (INDEPENDENT_AMBULATORY_CARE_PROVIDER_SITE_OTHER): Payer: Medicare Other | Admitting: Cardiology

## 2018-12-08 ENCOUNTER — Encounter: Payer: Self-pay | Admitting: Cardiology

## 2018-12-08 VITALS — BP 128/56 | HR 72 | Ht 69.0 in | Wt 173.6 lb

## 2018-12-08 DIAGNOSIS — Z9861 Coronary angioplasty status: Secondary | ICD-10-CM

## 2018-12-08 DIAGNOSIS — I10 Essential (primary) hypertension: Secondary | ICD-10-CM | POA: Diagnosis not present

## 2018-12-08 DIAGNOSIS — I4891 Unspecified atrial fibrillation: Secondary | ICD-10-CM | POA: Diagnosis not present

## 2018-12-08 DIAGNOSIS — I2510 Atherosclerotic heart disease of native coronary artery without angina pectoris: Secondary | ICD-10-CM | POA: Diagnosis not present

## 2018-12-08 DIAGNOSIS — I2102 ST elevation (STEMI) myocardial infarction involving left anterior descending coronary artery: Secondary | ICD-10-CM | POA: Diagnosis not present

## 2018-12-08 DIAGNOSIS — E785 Hyperlipidemia, unspecified: Secondary | ICD-10-CM

## 2018-12-08 NOTE — Progress Notes (Signed)
,  PCP: Reynold Bowen, MD  Clinic Note: Chief Complaint  Patient presents with  . Follow-up    No complaints  . Coronary Artery Disease    History of anterior MI in March 2018    HPI: [name] is a 85 y.o. male with a PMH notable for Anterior STEMI -CAD-PCI of LAD who presents today for 6 month follow-up.  S/p  Anterior STEMI in March 2018.  He has a history of a major GI bleed requiring transfusion in 2014 &  Ischemic stroke in 2012 with carotid artery disease.   He has hypertension and diet-controlled diabetes.  ELOHIM BRUNE was last seen back in September 2019  -he was doing great.  Did not appear to be 85 years old.  Very active, and full of energy.  Only noted some mild bruising but no real bleeding issues.  No cardiac symptoms to speak of.  No changes made.  Simply get him on Plavix alone does not   Recent Hospitalizations:   n/a  Studies Personally Reviewed - (if available, images/films reviewed: From Epic Chart or Care Everywhere)  No new studies no new studies  Interval History: Quinzell is an amazing man who does not at all appear to be 85 years old.  He continues to be fully active.  He is in great spirits as usual, making jokes.  He has not had any further bleeding episodes, but still has bruising.  For the most part this did improve after stopping his aspirin. He really has no cardiac complaints and is doing outstanding.  He still goes to the gym and walks routinely.  He denies any recurrent chest tightness pressure with rest or exertion.  He denies any exertional dyspnea.  No PND, orthopnea with mild end of day edema. He denies any rapid irregular heartbeats palpitations.  No syncope/near syncope or TIA/amaurosis fugax.  No myalgias.   ROS: A comprehensive was performed. Review of Systems  Constitutional: Negative for malaise/fatigue and weight loss.  HENT: Negative for nosebleeds.   Respiratory: Negative for cough and shortness of breath. Wheezing:       Cardiovascular:       Per history of present illness  Gastrointestinal: Negative for blood in stool, heartburn and melena.  Genitourinary: Negative for hematuria.  Musculoskeletal: Positive for joint pain (Left knee).  Neurological: Negative for dizziness, focal weakness and loss of consciousness.  Endo/Heme/Allergies: Does not bruise/bleed easily.  Psychiatric/Behavioral: Negative for depression and memory loss. The patient is not nervous/anxious.   All other systems reviewed and are negative.  I have reviewed and (if needed) personally updated the patient's problem list, medications, allergies, past medical and surgical history, social and family history.   Past Medical History:  Diagnosis Date  . Anemia   . Arthritis   . CAD S/P percutaneous coronary angioplasty    a. 11/2016: anterior STEMI s/p DES to LAD.   . Diabetes mellitus without complication ([place])    Diet controlled  . GERD (gastroesophageal reflux disease)   . GI bleed    10/2013: 2/2 duodenal ulcer. ASA/plavix discontinued.   . Hyperlipemia   . Hypertension   . PAF (paroxysmal atrial fibrillation) ([place])    a. noted during admission in 10/2013 for acute GI bleed. No recurrunce since. Monitor in 2014 with only PAC/PVCs  . ST elevation myocardial infarction (STEMI) involving left anterior descending (LAD) coronary artery with complication ([place]) 16/07/9603   - 99% subtotal occlusion of LAD treated with DES stent.  Marland Kitchen  Stroke [facility])    a. ichemic CVA in 2012.     Past Surgical History:  Procedure Laterality Date  . CORONARY STENT INTERVENTION N/A 12/11/2016   Procedure: Coronary Stent Intervention-Mid LAD;  Surgeon: Leonie Man, MD;  Location: [place] CV LAB;  Service: Cardiovascular:  mLAD 99%-0% Synergy DES 2.75 x 16 (3.0 mm)  . ESOPHAGOGASTRODUODENOSCOPY N/A 09/23/2013   Procedure: ESOPHAGOGASTRODUODENOSCOPY (EGD);  Surgeon: Lafayette Dragon, MD;  Location: [facility] ENDOSCOPY;  Service: Endoscopy;  Laterality: N/A;  .  ESOPHAGOGASTRODUODENOSCOPY N/A 09/25/2013   Procedure: ESOPHAGOGASTRODUODENOSCOPY (EGD);  Surgeon: Beryle Beams, MD;  Location: [facility] ENDOSCOPY;  Service: Endoscopy;  Laterality: N/A;  . ESOPHAGOGASTRODUODENOSCOPY N/A 12/18/2013   Procedure: ESOPHAGOGASTRODUODENOSCOPY (EGD);  Surgeon: Beryle Beams, MD;  Location: [facility] ENDOSCOPY;  Service: Endoscopy;  Laterality: N/A;  . LEFT HEART CATH AND CORONARY ANGIOGRAPHY N/A 12/11/2016   Procedure: Left Heart Cath and Coronary Angiography;  Surgeon: Leonie Man, MD;  Location: [place] CV LAB;  Service: Cardiovascular: LM 45%. mLAD 99% (PCI), dLAD 40% & 50%, ostD1 - 50%. Ost-prox Cx 60%, OstOM1 60% (Med Rx).  Anteroapical HK - EF 50-55%. Normal LVEDP  . TOTAL HIP ARTHROPLASTY Right 04/28/2013   Procedure: RIGHT TOTAL HIP ARTHROPLASTY ANTERIOR APPROACH;  Surgeon: Mauri Pole, MD;  Location: [facility] ORS;  Service: Orthopedics;  Laterality: Right;  . TRANSTHORACIC ECHOCARDIOGRAM  12/12/2016   Status post anterior STEMI:  Normal LV size with normal function. EF 60 deficits 5% with no regional wall motion abnormality. GR 2 DD. Mild LA dilation.     Cardiac Cath-PCI 12/11/2016: Wall Motion: EF 50-50%;   Diagnostic Diagram     Post-Intervention 2= Hypokinetic Apex, Ant Apex Culprit 99% mLAD - Synergy DES 2.75 x 16 (3.0 mm) ,      Current Meds  Medication Sig  . clopidogrel (PLAVIX) 75 MG tablet TAKE 1 TABLET EVERY DAY WITH BREAKFAST.  Marland Kitchen ergocalciferol (VITAMIN D2) 50000 UNITS capsule Take 50,000 Units by mouth once a week. Patient takes on Mondays  . fenofibrate 54 MG tablet Take 1 tablet (54 mg total) by mouth daily.  Marland Kitchen glimepiride (AMARYL) 2 MG tablet Take 1 tablet by mouth daily.  Marland Kitchen LANTUS SOLOSTAR 100 UNIT/ML Solostar Pen Inject 10 Units into the skin daily.  Marland Kitchen levothyroxine (SYNTHROID, LEVOTHROID) 50 MCG tablet Take 50 mcg by mouth daily before breakfast.  . metoprolol tartrate (LOPRESSOR) 25 MG tablet TAKE 1 TABLET BY MOUTH TWICE DAILY.  .  nitroGLYCERIN (NITROSTAT) 0.4 MG SL tablet Place 1 tablet (0.4 mg total) under the tongue every 5 (five) minutes x 3 doses as needed for chest pain.  . rosuvastatin (CRESTOR) 20 MG tablet Take 20 mg by mouth every morning.   . [DISCONTINUED] pantoprazole (PROTONIX) 40 MG tablet TAKE 1 TABLET ONCE DAILY.    Allergies  Allergen Reactions  . Adhesive [Tape] Rash    Zoll pads adhesive    Social History   Tobacco Use  . Smoking status: Never Smoker  . Smokeless tobacco: Never Used  Substance Use Topics  . Alcohol use: Yes    Alcohol/week: 3.0 standard drinks    Types: 3 Shots of liquor per week    Comment: wine or vodka   . Drug use: No   Social History   Social History Narrative  . Not on file    family history is not on file. Noncontributory based on his age and existing risk factors.  Wt Readings from Last 3 Encounters:  12/08/18 173  lb 9.6 oz (78.7 kg)  06/16/18 166 lb 6.4 oz (75.5 kg)  12/27/17 171 lb (77.6 kg)    PHYSICAL EXAM BP (!) 128/56   Pulse 72   Ht 5\' 9"  (1.753 m)   Wt 173 lb 9.6 oz (78.7 kg)   BMI 25.64 kg/m   Physical Exam  Constitutional: He is oriented to person, place, and time. He appears well-developed and well-nourished. No distress.  Appears much younger than stated age.  HENT:  Head: Normocephalic and atraumatic.  Neck: Normal range of motion. Neck supple. No hepatojugular reflux and no JVD present. Carotid bruit is not present.  Cardiovascular: Normal rate, regular rhythm, normal heart sounds and intact distal pulses.  No extrasystoles are present. PMI is not displaced. Exam reveals no gallop and no friction rub.  No murmur heard. Nondisplaced PMI.  Pulmonary/Chest: Effort normal and breath sounds normal. No respiratory distress. He has no wheezes. He has no rales.  Musculoskeletal: Normal range of motion.        General: No deformity or edema.  Neurological: He is alert and oriented to person, place, and time.  Psychiatric: He has a normal  mood and affect. His behavior is normal. Judgment and thought content normal.  Nursing note and vitals reviewed.   Adult ECG Report Sinus rhythm, rate 78.  1 degree AVB PACs and PVCs noted.  Stable EKG  Other studies Reviewed: Additional studies/ records that were reviewed today include:  Recent Labs:    Last check from June 2019: Total cholesterol 127, triglycerides 360, HDL 28, LDL 27.  A1c 8.  Creatinine 1.3.  ASSESSMENT / PLAN: Problem List Items Addressed This Visit    Atrial fibrillation with RVR ([place]) - Primary (Chronic)    Thankfully no recurrent episodes that I can tell.  In the absence of any follow-up spells, would be okay with just Plavix for anti-stroke prophylaxis.  Holding off on full anticoagulation without symptoms.  Not requiring rate control.  Only able to tolerate low-dose beta-blocker      Relevant Orders   EKG 12-Lead (Completed)   CAD S/P percutaneous coronary angioplasty (Chronic)    Doing fairly well after PCI of his LAD.  He has some existing disease in the circumflex but no anginal symptoms.  Continue to treat medically as it was not a really good PCI target. Continue stable dose of Crestor along with Plavix and metoprolol.  In the absence of symptoms, would not test further.      Dyslipidemia, goal LDL below 70 (Chronic)    Lipids have been well controlled.  Due for follow-up labs soon.  No change in statin.      Essential hypertension (Chronic)    Blood pressure looks great today.  No current changes. Continue beta-blocker      ST elevation myocardial infarction (STEMI) involving left anterior descending (LAD) coronary artery without development of Q waves (HCC) (Chronic)    It was a large anterior MI.  No recurrent angina or heart failure symptoms.  Follow-up echocardiogram shows pretty normal EF with no regional wall motion abnormalities.Marland Kitchen  He is on Plavix alone along with low-dose beta-blocker and monitor statin plus fenofibrate.  Otherwise on  diabetes meds.  Stable.      Relevant Orders   EKG 12-Lead (Completed)      Current medicines are reviewed at length with the patient today. (+/- concerns) n/a The following changes have been made: n/a  Patient Instructions    Medication Instructions:   If you  need a refill on your cardiac medications before your next appointment, please call your pharmacy.   Lab work: Not needed If you have labs (blood work) drawn today and your tests are completely normal, you will receive your results only by: Marland Kitchen MyChart Message (if you have MyChart) OR . A paper copy in the mail If you have any lab test that is abnormal or we need to change your treatment, we will call you to review the results.  Testing/Procedures: Not needed  Follow-Up: At [facility], you and your health needs are our priority.  As part of our continuing mission to provide you with exceptional heart care, we have created designated Provider Care Teams.  These Care Teams include your primary Cardiologist (physician) and Advanced Practice Providers (APPs -  Physician Assistants and Nurse Practitioners) who all work together to provide you with the care you need, when you need it. You will need a follow up appointment in 12 months MARCH 2021.  Please call our office 2 months in advance to schedule this appointment.  You may see Glenetta Hew, MD or one of the following Advanced Practice Providers on your designated Care Team:   Rosaria Ferries, PA-C . Jory Sims, DNP, ANP  Any Other Special Instructions Will Be Listed Below (If Applicable).    Studies Ordered:   Orders Placed This Encounter  Procedures  . EKG 12-Lead      Glenetta Hew, M.D., M.S. Interventional Cardiologist   Pager # [phone] Phone # [phone] [address] [place], [place] [address]

## 2018-12-08 NOTE — Patient Instructions (Addendum)
   Medication Instructions:   If you need a refill on your cardiac medications before your next appointment, please call your pharmacy.   Lab work: Not needed If you have labs (blood work) drawn today and your tests are completely normal, you will receive your results only by: Marland Kitchen MyChart Message (if you have MyChart) OR . A paper copy in the mail If you have any lab test that is abnormal or we need to change your treatment, we will call you to review the results.  Testing/Procedures: Not needed  Follow-Up: At [facility], you and your health needs are our priority.  As part of our continuing mission to provide you with exceptional heart care, we have created designated Provider Care Teams.  These Care Teams include your primary Cardiologist (physician) and Advanced Practice Providers (APPs -  Physician Assistants and Nurse Practitioners) who all work together to provide you with the care you need, when you need it. You will need a follow up appointment in 12 months MARCH 2021.  Please call our office 2 months in advance to schedule this appointment.  You may see Glenetta Hew, MD or one of the following Advanced Practice Providers on your designated Care Team:   Rosaria Ferries, PA-C . Jory Sims, DNP, ANP  Any Other Special Instructions Will Be Listed Below (If Applicable).

## 2018-12-10 ENCOUNTER — Other Ambulatory Visit: Payer: Self-pay | Admitting: Cardiology

## 2018-12-10 ENCOUNTER — Encounter: Payer: Self-pay | Admitting: Cardiology

## 2018-12-10 NOTE — Assessment & Plan Note (Signed)
Blood pressure looks great today.  No current changes. Continue beta-blocker

## 2018-12-10 NOTE — Assessment & Plan Note (Signed)
Lipids have been well controlled.  Due for follow-up labs soon.  No change in statin.

## 2018-12-10 NOTE — Assessment & Plan Note (Signed)
Thankfully no recurrent episodes that I can tell.  In the absence of any follow-up spells, would be okay with just Plavix for anti-stroke prophylaxis.  Holding off on full anticoagulation without symptoms.  Not requiring rate control.  Only able to tolerate low-dose beta-blocker

## 2018-12-10 NOTE — Assessment & Plan Note (Addendum)
Doing fairly well after PCI of his LAD.  He has some existing disease in the circumflex but no anginal symptoms.  Continue to treat medically as it was not a really good PCI target. Continue stable dose of Crestor along with Plavix and metoprolol.  In the absence of symptoms, would not test further.

## 2018-12-10 NOTE — Assessment & Plan Note (Signed)
It was a large anterior MI.  No recurrent angina or heart failure symptoms.  Follow-up echocardiogram shows pretty normal EF with no regional wall motion abnormalities.Marland Kitchen  He is on Plavix alone along with low-dose beta-blocker and monitor statin plus fenofibrate.  Otherwise on diabetes meds.  Stable.

## 2019-01-02 DIAGNOSIS — E559 Vitamin D deficiency, unspecified: Secondary | ICD-10-CM | POA: Diagnosis not present

## 2019-01-02 DIAGNOSIS — I129 Hypertensive chronic kidney disease with stage 1 through stage 4 chronic kidney disease, or unspecified chronic kidney disease: Secondary | ICD-10-CM | POA: Diagnosis not present

## 2019-01-02 DIAGNOSIS — K922 Gastrointestinal hemorrhage, unspecified: Secondary | ICD-10-CM | POA: Diagnosis not present

## 2019-01-02 DIAGNOSIS — E1165 Type 2 diabetes mellitus with hyperglycemia: Secondary | ICD-10-CM | POA: Diagnosis not present

## 2019-01-02 DIAGNOSIS — N183 Chronic kidney disease, stage 3 (moderate): Secondary | ICD-10-CM | POA: Diagnosis not present

## 2019-01-02 DIAGNOSIS — I6529 Occlusion and stenosis of unspecified carotid artery: Secondary | ICD-10-CM | POA: Diagnosis not present

## 2019-01-02 DIAGNOSIS — I4891 Unspecified atrial fibrillation: Secondary | ICD-10-CM | POA: Diagnosis not present

## 2019-01-02 DIAGNOSIS — G459 Transient cerebral ischemic attack, unspecified: Secondary | ICD-10-CM | POA: Diagnosis not present

## 2019-01-02 DIAGNOSIS — R269 Unspecified abnormalities of gait and mobility: Secondary | ICD-10-CM | POA: Diagnosis not present

## 2019-01-02 DIAGNOSIS — E785 Hyperlipidemia, unspecified: Secondary | ICD-10-CM | POA: Diagnosis not present

## 2019-01-02 DIAGNOSIS — I2510 Atherosclerotic heart disease of native coronary artery without angina pectoris: Secondary | ICD-10-CM | POA: Diagnosis not present

## 2019-01-02 DIAGNOSIS — E039 Hypothyroidism, unspecified: Secondary | ICD-10-CM | POA: Diagnosis not present

## 2019-01-21 ENCOUNTER — Other Ambulatory Visit: Payer: Self-pay | Admitting: Physician Assistant

## 2019-02-10 DIAGNOSIS — Z85828 Personal history of other malignant neoplasm of skin: Secondary | ICD-10-CM | POA: Diagnosis not present

## 2019-02-10 DIAGNOSIS — L570 Actinic keratosis: Secondary | ICD-10-CM | POA: Diagnosis not present

## 2019-02-10 DIAGNOSIS — L820 Inflamed seborrheic keratosis: Secondary | ICD-10-CM | POA: Diagnosis not present

## 2019-04-09 ENCOUNTER — Other Ambulatory Visit: Payer: Self-pay | Admitting: Cardiology

## 2019-04-10 NOTE — Telephone Encounter (Signed)
Rx(s) sent to pharmacy electronically.  

## 2019-05-07 DIAGNOSIS — I6529 Occlusion and stenosis of unspecified carotid artery: Secondary | ICD-10-CM | POA: Diagnosis not present

## 2019-05-07 DIAGNOSIS — N183 Chronic kidney disease, stage 3 (moderate): Secondary | ICD-10-CM | POA: Diagnosis not present

## 2019-05-07 DIAGNOSIS — I2510 Atherosclerotic heart disease of native coronary artery without angina pectoris: Secondary | ICD-10-CM | POA: Diagnosis not present

## 2019-05-07 DIAGNOSIS — K922 Gastrointestinal hemorrhage, unspecified: Secondary | ICD-10-CM | POA: Diagnosis not present

## 2019-05-07 DIAGNOSIS — I129 Hypertensive chronic kidney disease with stage 1 through stage 4 chronic kidney disease, or unspecified chronic kidney disease: Secondary | ICD-10-CM | POA: Diagnosis not present

## 2019-05-07 DIAGNOSIS — I639 Cerebral infarction, unspecified: Secondary | ICD-10-CM | POA: Diagnosis not present

## 2019-05-07 DIAGNOSIS — E039 Hypothyroidism, unspecified: Secondary | ICD-10-CM | POA: Diagnosis not present

## 2019-05-07 DIAGNOSIS — Z794 Long term (current) use of insulin: Secondary | ICD-10-CM | POA: Diagnosis not present

## 2019-05-07 DIAGNOSIS — E785 Hyperlipidemia, unspecified: Secondary | ICD-10-CM | POA: Diagnosis not present

## 2019-05-07 DIAGNOSIS — I4891 Unspecified atrial fibrillation: Secondary | ICD-10-CM | POA: Diagnosis not present

## 2019-05-07 DIAGNOSIS — E559 Vitamin D deficiency, unspecified: Secondary | ICD-10-CM | POA: Diagnosis not present

## 2019-05-07 DIAGNOSIS — E1129 Type 2 diabetes mellitus with other diabetic kidney complication: Secondary | ICD-10-CM | POA: Diagnosis not present

## 2019-05-19 DIAGNOSIS — D1801 Hemangioma of skin and subcutaneous tissue: Secondary | ICD-10-CM | POA: Diagnosis not present

## 2019-05-19 DIAGNOSIS — M7130 Other bursal cyst, unspecified site: Secondary | ICD-10-CM | POA: Diagnosis not present

## 2019-05-19 DIAGNOSIS — L821 Other seborrheic keratosis: Secondary | ICD-10-CM | POA: Diagnosis not present

## 2019-05-19 DIAGNOSIS — Z85828 Personal history of other malignant neoplasm of skin: Secondary | ICD-10-CM | POA: Diagnosis not present

## 2019-05-19 DIAGNOSIS — L570 Actinic keratosis: Secondary | ICD-10-CM | POA: Diagnosis not present

## 2019-06-30 DIAGNOSIS — E038 Other specified hypothyroidism: Secondary | ICD-10-CM | POA: Diagnosis not present

## 2019-06-30 DIAGNOSIS — Z23 Encounter for immunization: Secondary | ICD-10-CM | POA: Diagnosis not present

## 2019-06-30 DIAGNOSIS — E1129 Type 2 diabetes mellitus with other diabetic kidney complication: Secondary | ICD-10-CM | POA: Diagnosis not present

## 2019-06-30 DIAGNOSIS — Z79899 Other long term (current) drug therapy: Secondary | ICD-10-CM | POA: Diagnosis not present

## 2019-06-30 DIAGNOSIS — I10 Essential (primary) hypertension: Secondary | ICD-10-CM | POA: Diagnosis not present

## 2019-09-20 ENCOUNTER — Other Ambulatory Visit: Payer: Self-pay | Admitting: Cardiology

## 2019-09-21 NOTE — Telephone Encounter (Signed)
Rx(s) sent to pharmacy electronically.  

## 2019-09-23 ENCOUNTER — Other Ambulatory Visit: Payer: Self-pay | Admitting: Endocrinology

## 2019-09-23 ENCOUNTER — Encounter (HOSPITAL_COMMUNITY): Payer: Self-pay

## 2019-09-23 ENCOUNTER — Other Ambulatory Visit: Payer: Self-pay

## 2019-09-23 ENCOUNTER — Ambulatory Visit (HOSPITAL_COMMUNITY)
Admission: RE | Admit: 2019-09-23 | Discharge: 2019-09-23 | Disposition: A | Discharge status: Home / Self Care | Payer: Medicare Other | Source: Ambulatory Visit | Attending: Endocrinology | Admitting: Endocrinology

## 2019-09-23 ENCOUNTER — Other Ambulatory Visit (HOSPITAL_COMMUNITY): Payer: Self-pay | Admitting: Endocrinology

## 2019-09-23 ENCOUNTER — Inpatient Hospital Stay (HOSPITAL_COMMUNITY)
Admission: EM | Admit: 2019-09-23 | Discharge: 2019-09-26 | DRG: 065 | Disposition: A | Discharge status: Home Health | Payer: Medicare Other | Attending: Family Medicine | Admitting: Family Medicine

## 2019-09-23 DIAGNOSIS — H539 Unspecified visual disturbance: Secondary | ICD-10-CM | POA: Insufficient documentation

## 2019-09-23 DIAGNOSIS — I63411 Cerebral infarction due to embolism of right middle cerebral artery: Secondary | ICD-10-CM | POA: Diagnosis not present

## 2019-09-23 DIAGNOSIS — Z8673 Personal history of transient ischemic attack (TIA), and cerebral infarction without residual deficits: Secondary | ICD-10-CM

## 2019-09-23 DIAGNOSIS — I63511 Cerebral infarction due to unspecified occlusion or stenosis of right middle cerebral artery: Secondary | ICD-10-CM | POA: Diagnosis present

## 2019-09-23 DIAGNOSIS — R4182 Altered mental status, unspecified: Secondary | ICD-10-CM | POA: Diagnosis not present

## 2019-09-23 DIAGNOSIS — R17 Unspecified jaundice: Secondary | ICD-10-CM | POA: Diagnosis present

## 2019-09-23 DIAGNOSIS — Z03818 Encounter for observation for suspected exposure to other biological agents ruled out: Secondary | ICD-10-CM | POA: Diagnosis not present

## 2019-09-23 DIAGNOSIS — I2510 Atherosclerotic heart disease of native coronary artery without angina pectoris: Secondary | ICD-10-CM | POA: Diagnosis present

## 2019-09-23 DIAGNOSIS — Z79899 Other long term (current) drug therapy: Secondary | ICD-10-CM

## 2019-09-23 DIAGNOSIS — R29700 NIHSS score 0: Secondary | ICD-10-CM | POA: Diagnosis present

## 2019-09-23 DIAGNOSIS — Z20828 Contact with and (suspected) exposure to other viral communicable diseases: Secondary | ICD-10-CM | POA: Diagnosis present

## 2019-09-23 DIAGNOSIS — I252 Old myocardial infarction: Secondary | ICD-10-CM

## 2019-09-23 DIAGNOSIS — E1151 Type 2 diabetes mellitus with diabetic peripheral angiopathy without gangrene: Secondary | ICD-10-CM | POA: Diagnosis present

## 2019-09-23 DIAGNOSIS — I10 Essential (primary) hypertension: Secondary | ICD-10-CM | POA: Diagnosis not present

## 2019-09-23 DIAGNOSIS — L89159 Pressure ulcer of sacral region, unspecified stage: Secondary | ICD-10-CM | POA: Diagnosis present

## 2019-09-23 DIAGNOSIS — Z955 Presence of coronary angioplasty implant and graft: Secondary | ICD-10-CM

## 2019-09-23 DIAGNOSIS — I639 Cerebral infarction, unspecified: Secondary | ICD-10-CM

## 2019-09-23 DIAGNOSIS — E039 Hypothyroidism, unspecified: Secondary | ICD-10-CM | POA: Diagnosis present

## 2019-09-23 DIAGNOSIS — Z794 Long term (current) use of insulin: Secondary | ICD-10-CM

## 2019-09-23 DIAGNOSIS — E785 Hyperlipidemia, unspecified: Secondary | ICD-10-CM | POA: Diagnosis present

## 2019-09-23 DIAGNOSIS — E1165 Type 2 diabetes mellitus with hyperglycemia: Secondary | ICD-10-CM | POA: Diagnosis present

## 2019-09-23 DIAGNOSIS — E119 Type 2 diabetes mellitus without complications: Secondary | ICD-10-CM

## 2019-09-23 DIAGNOSIS — Z96641 Presence of right artificial hip joint: Secondary | ICD-10-CM | POA: Diagnosis present

## 2019-09-23 DIAGNOSIS — Z79890 Hormone replacement therapy (postmenopausal): Secondary | ICD-10-CM

## 2019-09-23 DIAGNOSIS — I480 Paroxysmal atrial fibrillation: Secondary | ICD-10-CM | POA: Diagnosis present

## 2019-09-23 DIAGNOSIS — N390 Urinary tract infection, site not specified: Secondary | ICD-10-CM | POA: Diagnosis present

## 2019-09-23 DIAGNOSIS — K219 Gastro-esophageal reflux disease without esophagitis: Secondary | ICD-10-CM | POA: Diagnosis present

## 2019-09-23 DIAGNOSIS — Z7902 Long term (current) use of antithrombotics/antiplatelets: Secondary | ICD-10-CM

## 2019-09-23 DIAGNOSIS — D72829 Elevated white blood cell count, unspecified: Secondary | ICD-10-CM | POA: Diagnosis present

## 2019-09-23 LAB — DIFFERENTIAL
Abs Immature Granulocytes: 0.04 10*3/uL (ref 0.00–0.07)
Basophils Absolute: 0.1 10*3/uL (ref 0.0–0.1)
Basophils Relative: 1 %
Eosinophils Absolute: 0.2 10*3/uL (ref 0.0–0.5)
Eosinophils Relative: 1 %
Immature Granulocytes: 0 %
Lymphocytes Relative: 21 %
Lymphs Abs: 2.5 10*3/uL (ref 0.7–4.0)
Monocytes Absolute: 1.5 10*3/uL — ABNORMAL HIGH (ref 0.1–1.0)
Monocytes Relative: 12 %
Neutro Abs: 7.9 10*3/uL — ABNORMAL HIGH (ref 1.7–7.7)
Neutrophils Relative %: 65 %

## 2019-09-23 LAB — CBG MONITORING, ED: Glucose-Capillary: 95 mg/dL (ref 70–99)

## 2019-09-23 LAB — CBC
HCT: 43.8 % (ref 39.0–52.0)
Hemoglobin: 14.5 g/dL (ref 13.0–17.0)
MCH: 33 pg (ref 26.0–34.0)
MCHC: 33.1 g/dL (ref 30.0–36.0)
MCV: 99.8 fL (ref 80.0–100.0)
Platelets: 263 10*3/uL (ref 150–400)
RBC: 4.39 MIL/uL (ref 4.22–5.81)
RDW: 13.3 % (ref 11.5–15.5)
WBC: 12.2 10*3/uL — ABNORMAL HIGH (ref 4.0–10.5)
nRBC: 0 % (ref 0.0–0.2)

## 2019-09-23 LAB — COMPREHENSIVE METABOLIC PANEL
ALT: 20 U/L (ref 0–44)
AST: 29 U/L (ref 15–41)
Albumin: 3.8 g/dL (ref 3.5–5.0)
Alkaline Phosphatase: 45 U/L (ref 38–126)
Anion gap: 11 (ref 5–15)
BUN: 19 mg/dL (ref 8–23)
CO2: 21 mmol/L — ABNORMAL LOW (ref 22–32)
Calcium: 9.9 mg/dL (ref 8.9–10.3)
Chloride: 111 mmol/L (ref 98–111)
Creatinine, Ser: 1.19 mg/dL (ref 0.61–1.24)
GFR calc Af Amer: >60 mL/min (ref >60)
GFR calc non Af Amer: 53 mL/min — ABNORMAL LOW (ref >60)
Glucose, Bld: 102 mg/dL — ABNORMAL HIGH (ref 70–99)
Potassium: 4.3 mmol/L (ref 3.5–5.1)
Sodium: 143 mmol/L (ref 135–145)
Total Bilirubin: 1.5 mg/dL — ABNORMAL HIGH (ref 0.3–1.2)
Total Protein: 6.5 g/dL (ref 6.5–8.1)

## 2019-09-23 LAB — APTT: aPTT: 27 seconds (ref 24–36)

## 2019-09-23 LAB — PROTIME-INR
INR: 1 (ref 0.8–1.2)
Prothrombin Time: 13.4 seconds (ref 11.4–15.2)

## 2019-09-23 NOTE — ED Triage Notes (Signed)
Pt sent here by PCP due to stroke on MRI that pt had done today. Pt states on Monday he had right eye blurriness that has now improved. No other neuro symptoms noted. Pt a.o.

## 2019-09-23 NOTE — ED Notes (Signed)
Daughter called- would like someone to check on pt

## 2019-09-23 NOTE — ED Notes (Signed)
DaughterKennyth Lose [phone]

## 2019-09-24 ENCOUNTER — Inpatient Hospital Stay (HOSPITAL_COMMUNITY): Payer: Medicare Other

## 2019-09-24 ENCOUNTER — Encounter (HOSPITAL_COMMUNITY): Payer: Self-pay | Admitting: Internal Medicine

## 2019-09-24 DIAGNOSIS — Z79890 Hormone replacement therapy (postmenopausal): Secondary | ICD-10-CM | POA: Diagnosis not present

## 2019-09-24 DIAGNOSIS — I63511 Cerebral infarction due to unspecified occlusion or stenosis of right middle cerebral artery: Secondary | ICD-10-CM

## 2019-09-24 DIAGNOSIS — N390 Urinary tract infection, site not specified: Secondary | ICD-10-CM | POA: Diagnosis present

## 2019-09-24 DIAGNOSIS — Z79899 Other long term (current) drug therapy: Secondary | ICD-10-CM | POA: Diagnosis not present

## 2019-09-24 DIAGNOSIS — E039 Hypothyroidism, unspecified: Secondary | ICD-10-CM | POA: Diagnosis present

## 2019-09-24 DIAGNOSIS — I6389 Other cerebral infarction: Secondary | ICD-10-CM | POA: Diagnosis not present

## 2019-09-24 DIAGNOSIS — I10 Essential (primary) hypertension: Secondary | ICD-10-CM

## 2019-09-24 DIAGNOSIS — I6521 Occlusion and stenosis of right carotid artery: Secondary | ICD-10-CM | POA: Diagnosis not present

## 2019-09-24 DIAGNOSIS — K219 Gastro-esophageal reflux disease without esophagitis: Secondary | ICD-10-CM | POA: Diagnosis present

## 2019-09-24 DIAGNOSIS — E119 Type 2 diabetes mellitus without complications: Secondary | ICD-10-CM

## 2019-09-24 DIAGNOSIS — I2510 Atherosclerotic heart disease of native coronary artery without angina pectoris: Secondary | ICD-10-CM | POA: Diagnosis present

## 2019-09-24 DIAGNOSIS — R29700 NIHSS score 0: Secondary | ICD-10-CM | POA: Diagnosis present

## 2019-09-24 DIAGNOSIS — I252 Old myocardial infarction: Secondary | ICD-10-CM | POA: Diagnosis not present

## 2019-09-24 DIAGNOSIS — I4891 Unspecified atrial fibrillation: Secondary | ICD-10-CM | POA: Diagnosis not present

## 2019-09-24 DIAGNOSIS — R17 Unspecified jaundice: Secondary | ICD-10-CM | POA: Diagnosis present

## 2019-09-24 DIAGNOSIS — D72829 Elevated white blood cell count, unspecified: Secondary | ICD-10-CM | POA: Diagnosis present

## 2019-09-24 DIAGNOSIS — L89159 Pressure ulcer of sacral region, unspecified stage: Secondary | ICD-10-CM | POA: Diagnosis present

## 2019-09-24 DIAGNOSIS — Z7902 Long term (current) use of antithrombotics/antiplatelets: Secondary | ICD-10-CM | POA: Diagnosis not present

## 2019-09-24 DIAGNOSIS — E785 Hyperlipidemia, unspecified: Secondary | ICD-10-CM | POA: Diagnosis present

## 2019-09-24 DIAGNOSIS — E1151 Type 2 diabetes mellitus with diabetic peripheral angiopathy without gangrene: Secondary | ICD-10-CM | POA: Diagnosis present

## 2019-09-24 DIAGNOSIS — Z8673 Personal history of transient ischemic attack (TIA), and cerebral infarction without residual deficits: Secondary | ICD-10-CM | POA: Diagnosis not present

## 2019-09-24 DIAGNOSIS — Z96641 Presence of right artificial hip joint: Secondary | ICD-10-CM | POA: Diagnosis present

## 2019-09-24 DIAGNOSIS — I480 Paroxysmal atrial fibrillation: Secondary | ICD-10-CM | POA: Diagnosis present

## 2019-09-24 DIAGNOSIS — I63411 Cerebral infarction due to embolism of right middle cerebral artery: Secondary | ICD-10-CM | POA: Diagnosis present

## 2019-09-24 DIAGNOSIS — Z955 Presence of coronary angioplasty implant and graft: Secondary | ICD-10-CM | POA: Diagnosis not present

## 2019-09-24 DIAGNOSIS — E1165 Type 2 diabetes mellitus with hyperglycemia: Secondary | ICD-10-CM | POA: Diagnosis present

## 2019-09-24 DIAGNOSIS — R4182 Altered mental status, unspecified: Secondary | ICD-10-CM | POA: Diagnosis present

## 2019-09-24 DIAGNOSIS — Z20828 Contact with and (suspected) exposure to other viral communicable diseases: Secondary | ICD-10-CM | POA: Diagnosis present

## 2019-09-24 DIAGNOSIS — Z794 Long term (current) use of insulin: Secondary | ICD-10-CM | POA: Diagnosis not present

## 2019-09-24 LAB — URINALYSIS, ROUTINE W REFLEX MICROSCOPIC
Bilirubin Urine: NEGATIVE
Glucose, UA: >=500 mg/dL — AB
Hgb urine dipstick: NEGATIVE
Ketones, ur: 20 mg/dL — AB
Nitrite: NEGATIVE
Protein, ur: NEGATIVE mg/dL
Specific Gravity, Urine: 1.017 (ref 1.005–1.030)
WBC, UA: >50 WBC/hpf — ABNORMAL HIGH (ref 0–5)
pH: 6 (ref 5.0–8.0)

## 2019-09-24 LAB — COMPREHENSIVE METABOLIC PANEL
ALT: 20 U/L (ref 0–44)
AST: 27 U/L (ref 15–41)
Albumin: 3.9 g/dL (ref 3.5–5.0)
Alkaline Phosphatase: 41 U/L (ref 38–126)
Anion gap: 14 (ref 5–15)
BUN: 19 mg/dL (ref 8–23)
CO2: 21 mmol/L — ABNORMAL LOW (ref 22–32)
Calcium: 9.7 mg/dL (ref 8.9–10.3)
Chloride: 109 mmol/L (ref 98–111)
Creatinine, Ser: 1.04 mg/dL (ref 0.61–1.24)
GFR calc Af Amer: >60 mL/min (ref >60)
GFR calc non Af Amer: >60 mL/min (ref >60)
Glucose, Bld: 87 mg/dL (ref 70–99)
Potassium: 4.5 mmol/L (ref 3.5–5.1)
Sodium: 144 mmol/L (ref 135–145)
Total Bilirubin: 1.5 mg/dL — ABNORMAL HIGH (ref 0.3–1.2)
Total Protein: 6.7 g/dL (ref 6.5–8.1)

## 2019-09-24 LAB — CBC
HCT: 44.7 % (ref 39.0–52.0)
Hemoglobin: 15.1 g/dL (ref 13.0–17.0)
MCH: 33.4 pg (ref 26.0–34.0)
MCHC: 33.8 g/dL (ref 30.0–36.0)
MCV: 98.9 fL (ref 80.0–100.0)
Platelets: 257 10*3/uL (ref 150–400)
RBC: 4.52 MIL/uL (ref 4.22–5.81)
RDW: 13.3 % (ref 11.5–15.5)
WBC: 11.6 10*3/uL — ABNORMAL HIGH (ref 4.0–10.5)
nRBC: 0 % (ref 0.0–0.2)

## 2019-09-24 LAB — TSH: TSH: 1.451 u[IU]/mL (ref 0.350–4.500)

## 2019-09-24 LAB — GLUCOSE, CAPILLARY: Glucose-Capillary: 119 mg/dL — ABNORMAL HIGH (ref 70–99)

## 2019-09-24 LAB — CBG MONITORING, ED
Glucose-Capillary: 121 mg/dL — ABNORMAL HIGH (ref 70–99)
Glucose-Capillary: 155 mg/dL — ABNORMAL HIGH (ref 70–99)
Glucose-Capillary: 124 mg/dL — ABNORMAL HIGH (ref 70–99)

## 2019-09-24 LAB — ECHOCARDIOGRAM COMPLETE

## 2019-09-24 LAB — SARS CORONAVIRUS 2 (TAT 6-24 HRS): SARS Coronavirus 2: NEGATIVE

## 2019-09-24 MED ORDER — APIXABAN 5 MG PO TABS
5.0000 mg | ORAL_TABLET | Freq: Two times a day (BID) | ORAL | Status: DC
  Administered 2019-09-24 – 2019-09-26 (×4): 5 mg via ORAL
  Filled 2019-09-24 (×5): qty 1

## 2019-09-24 MED ORDER — CLOPIDOGREL BISULFATE 75 MG PO TABS
75.0000 mg | ORAL_TABLET | Freq: Every day | ORAL | Status: DC
  Administered 2019-09-24: 75 mg via ORAL
  Filled 2019-09-24: qty 1

## 2019-09-24 MED ORDER — LEVOTHYROXINE SODIUM 50 MCG PO TABS
50.0000 ug | ORAL_TABLET | Freq: Every day | ORAL | Status: DC
  Administered 2019-09-25 – 2019-09-26 (×2): 50 ug via ORAL
  Filled 2019-09-24 (×2): qty 1

## 2019-09-24 MED ORDER — ROSUVASTATIN CALCIUM 20 MG PO TABS
20.0000 mg | ORAL_TABLET | Freq: Every morning | ORAL | Status: DC
  Administered 2019-09-24 – 2019-09-26 (×3): 20 mg via ORAL
  Filled 2019-09-24 (×4): qty 1

## 2019-09-24 MED ORDER — INSULIN GLARGINE 100 UNIT/ML ~~LOC~~ SOLN
10.0000 [IU] | Freq: Every day | SUBCUTANEOUS | Status: DC
  Administered 2019-09-25 – 2019-09-26 (×2): 10 [IU] via SUBCUTANEOUS
  Filled 2019-09-24 (×4): qty 0.1

## 2019-09-24 MED ORDER — SENNOSIDES-DOCUSATE SODIUM 8.6-50 MG PO TABS: 1.0000 | ORAL_TABLET | Freq: Every evening | ORAL | Status: DC | PRN

## 2019-09-24 MED ORDER — PERFLUTREN LIPID MICROSPHERE
1.0000 mL | INTRAVENOUS | Status: AC | PRN
  Administered 2019-09-24: 1 mL via INTRAVENOUS
  Filled 2019-09-24: qty 10

## 2019-09-24 MED ORDER — PANTOPRAZOLE SODIUM 40 MG PO TBEC
40.0000 mg | DELAYED_RELEASE_TABLET | Freq: Every day | ORAL | Status: DC
  Administered 2019-09-24 – 2019-09-26 (×3): 40 mg via ORAL
  Filled 2019-09-24 (×3): qty 1

## 2019-09-24 MED ORDER — ENOXAPARIN SODIUM 40 MG/0.4ML ~~LOC~~ SOLN
40.0000 mg | SUBCUTANEOUS | Status: DC
  Administered 2019-09-24: 40 mg via SUBCUTANEOUS
  Filled 2019-09-24 (×2): qty 0.4

## 2019-09-24 MED ORDER — ACETAMINOPHEN 325 MG PO TABS: 650.0000 mg | ORAL_TABLET | ORAL | Status: DC | PRN

## 2019-09-24 MED ORDER — IOHEXOL 350 MG/ML SOLN
100.0000 mL | Freq: Once | INTRAVENOUS | Status: AC | PRN
  Administered 2019-09-24: 100 mL via INTRAVENOUS

## 2019-09-24 MED ORDER — INSULIN GLARGINE 100 UNIT/ML ~~LOC~~ SOLN
15.0000 [IU] | Freq: Every day | SUBCUTANEOUS | Status: DC
  Filled 2019-09-24: qty 0.15

## 2019-09-24 MED ORDER — ASPIRIN EC 81 MG PO TBEC
81.0000 mg | DELAYED_RELEASE_TABLET | Freq: Every day | ORAL | Status: DC
  Administered 2019-09-24: 81 mg via ORAL
  Filled 2019-09-24: qty 1

## 2019-09-24 MED ORDER — SODIUM CHLORIDE 0.9 % IV SOLN
1.0000 g | INTRAVENOUS | Status: DC
  Administered 2019-09-24 – 2019-09-25 (×2): 1 g via INTRAVENOUS
  Filled 2019-09-24 (×2): qty 10

## 2019-09-24 MED ORDER — ACETAMINOPHEN 160 MG/5ML PO SOLN: 650.0000 mg | ORAL | Status: DC | PRN

## 2019-09-24 MED ORDER — INSULIN GLARGINE 100 UNIT/ML SOLOSTAR PEN: 15.0000 [IU] | PEN_INJECTOR | Freq: Every day | SUBCUTANEOUS | Status: DC

## 2019-09-24 MED ORDER — SODIUM CHLORIDE 0.9 % IV SOLN: Freq: Once | INTRAVENOUS | Status: AC

## 2019-09-24 MED ORDER — ALBUTEROL SULFATE (2.5 MG/3ML) 0.083% IN NEBU: 2.5000 mg | INHALATION_SOLUTION | Freq: Four times a day (QID) | RESPIRATORY_TRACT | Status: DC | PRN

## 2019-09-24 MED ORDER — INSULIN ASPART 100 UNIT/ML ~~LOC~~ SOLN
0.0000 [IU] | Freq: Three times a day (TID) | SUBCUTANEOUS | Status: DC
  Administered 2019-09-24: 1 [IU] via SUBCUTANEOUS
  Administered 2019-09-25: 2 [IU] via SUBCUTANEOUS
  Administered 2019-09-26: 1 [IU] via SUBCUTANEOUS

## 2019-09-24 MED ORDER — STROKE: EARLY STAGES OF RECOVERY BOOK
Freq: Once | Status: AC
  Filled 2019-09-24: qty 1

## 2019-09-24 MED ORDER — ACETAMINOPHEN 650 MG RE SUPP: 650.0000 mg | RECTAL | Status: DC | PRN

## 2019-09-24 NOTE — Evaluation (Signed)
Physical Therapy Evaluation Patient Details Name: [name] MRN: [id number] DOB: Jan 01, 1924 Today's Date: 09/24/2019   History of Present Illness  [name] is an 85 y.o. male with a history of atrial fibrillation and prior stroke in 2012, on Plavix. Sent in by PCP due to stroke on MRI that patient had done on Wednesday. His symptoms consisted of right eye blurriness that is now asymptomatic.  Clinical Impression  Patient presents with mobility likely at his baseline per his report.  He is able to get up OOB independently, needed supervision for safety with RW ambulating on unit today demonstrating shuffling gait pattern with short stride length and reports chronic L knee pain.  Feel he will benefit from skilled PT in the acute setting to maximize safety and independence prior to return home with HHPT follow up at least for safety eval as pt lives alone.     Follow Up Recommendations Home health PT(for safety eval)    Equipment Recommendations  None recommended by PT    Recommendations for Other Services       Precautions / Restrictions Precautions Precautions: Fall Precaution Comments: denies falls at home      Mobility  Bed Mobility Overal bed mobility: Modified Independent                Transfers Overall transfer level: Modified independent                  Ambulation/Gait Ambulation/Gait assistance: Supervision Gait Distance (Feet): 300 Feet Assistive device: Rolling walker (2 wheeled) Gait Pattern/deviations: Step-through pattern;Decreased stride length;Wide base of support     General Gait Details: some evidence of instability walking with RW but pt reports feels at baseline, has limited ankle AROM normally with intermittent swelling of L lower leg.  Stairs            Wheelchair Mobility    Modified Rankin (Stroke Patients Only) Modified Rankin (Stroke Patients Only) Pre-Morbid Rankin Score: Slight disability Modified Rankin:  Moderate disability     Balance                                             Pertinent Vitals/Pain Pain Assessment: Faces Faces Pain Scale: Hurts even more Pain Location: L knee arthritic pain Pain Descriptors / Indicators: Aching Pain Intervention(s): Monitored during session    Home Living Family/patient expects to be discharged to:: Private residence Living Arrangements: Alone   Type of Home: Independent living facility([place])       Home Layout: One level Home Equipment: Walker - 2 wheels;Cane - single point;Grab bars - tub/shower;Grab bars - toilet;Shower seat - built in      Prior Function Level of Independence: Independent with assistive device(s)         Comments: prefers to go out to eat to meals at facility     Hand Dominance        Extremity/Trunk Assessment   Upper Extremity Assessment Upper Extremity Assessment: RUE deficits/detail RUE Deficits / Details: AROM WFL, crepitus felt in shoulder with testing strength 4/5    Lower Extremity Assessment Lower Extremity Assessment: LLE deficits/detail LLE Deficits / Details: strength WFL but pain and obvious arthritic deformity in knee; both ankles with limited DF       Communication   Communication: No difficulties  Cognition Arousal/Alertness: Awake/alert Behavior During Therapy: WFL for tasks assessed/performed Overall  Cognitive Status: Within Functional Limits for tasks assessed                                        General Comments      Exercises     Assessment/Plan    PT Assessment Patient needs continued PT services  PT Problem List Decreased mobility;Decreased balance;Decreased range of motion;Pain;Decreased knowledge of precautions       PT Treatment Interventions DME instruction;Therapeutic activities;Balance training;Neuromuscular re-education;Therapeutic exercise;Gait training;Functional mobility training;Patient/family education    PT  Goals (Current goals can be found in the Care Plan section)  Acute Rehab PT Goals Patient Stated Goal: to return to independent PT Goal Formulation: With patient Time For Goal Achievement: 10/08/19 Potential to Achieve Goals: Good    Frequency Min 3X/week   Barriers to discharge        Co-evaluation               AM-PAC PT "6 Clicks" Mobility  Outcome Measure Help needed turning from your back to your side while in a flat bed without using bedrails?: None Help needed moving from lying on your back to sitting on the side of a flat bed without using bedrails?: None Help needed moving to and from a bed to a chair (including a wheelchair)?: A Little Help needed standing up from a chair using your arms (e.g., wheelchair or bedside chair)?: None Help needed to walk in hospital room?: A Little Help needed climbing 3-5 steps with a railing? : A Little 6 Click Score: 21    End of Session   Activity Tolerance: Patient tolerated treatment well Patient left: in bed;with call bell/phone within reach   PT Visit Diagnosis: Other abnormalities of gait and mobility (R26.89)    Time: [id number] PT Time Calculation (min) (ACUTE ONLY): 25 min   Charges:   PT Evaluation $PT Eval Moderate Complexity: 1 Mod PT Treatments $Gait Training: 8-22 mins        Magda Kiel, [place] [phone] 09/24/2019   Reginia Naas 09/24/2019, 4:10 PM

## 2019-09-24 NOTE — ED Notes (Signed)
CBG 155 

## 2019-09-24 NOTE — Progress Notes (Signed)
STROKE TEAM PROGRESS NOTE   INTERVAL HISTORY I personally reviewed history of presenting illness with the patient.  He presented with transient episode of right eye superior field altitudinal vision loss which has improved.  MRI shows incidental right MCA frontal branch infarct.  Patient has remote history of paroxysmal A. fib in 2012 and GI bleed and's was not on anticoagulation.  CT angiogram done today shows 80% right proximal ICA stenosis which is likely symptomatic  Vitals:   09/24/19 0815 09/24/19 0830 09/24/19 0900 09/24/19 0915  BP: (!) 149/89 (!) 177/77 (!) 162/84 (!) 161/89  Pulse: 75 76 76 74  Resp:      Temp:      TempSrc:      SpO2: 100% 98% 100% 99%    CBC:  Recent Labs  Lab 09/23/19 1655 09/24/19 0930  WBC 12.2* 11.6*  NEUTROABS 7.9*  --   HGB 14.5 15.1  HCT 43.8 44.7  MCV 99.8 98.9  PLT 263 [id number]    Basic Metabolic Panel:  Recent Labs  Lab 09/23/19 1655 09/24/19 0930  NA 143 144  K 4.3 4.5  CL 111 109  CO2 21* 21*  GLUCOSE 102* 87  BUN 19 19  CREATININE 1.19 1.04  CALCIUM 9.9 9.7   Lipid Panel:     Component Value Date/Time   CHOL CANCELED 12/24/2016 1208   TRIG CANCELED 12/24/2016 1208   HDL CANCELED 12/24/2016 1208   CHOLHDL NOT CALC 12/24/2016 1208   VLDL NOT CALC 12/24/2016 1208   LDLCALC NOT CALC 12/24/2016 1208   HgbA1c:  Lab Results  Component Value Date   HGBA1C 9.7 (H) 12/12/2016   Urine Drug Screen: No results found for: LABOPIA, COCAINSCRNUR, LABBENZ, AMPHETMU, THCU, LABBARB  Alcohol Level No results found for: ETH  IMAGING CT ANGIO HEAD W OR WO CONTRAST  Result Date: 09/24/2019 CLINICAL DATA:  Stroke, follow-up EXAM: CT ANGIOGRAPHY HEAD AND NECK TECHNIQUE: Multidetector CT imaging of the head and neck was performed using the standard protocol during bolus administration of intravenous contrast. Multiplanar CT image reconstructions and MIPs were obtained to evaluate the vascular anatomy. Carotid stenosis measurements (when  applicable) are obtained utilizing NASCET criteria, using the distal internal carotid diameter as the denominator. CONTRAST:  151mL OMNIPAQUE IOHEXOL 350 MG/ML SOLN COMPARISON:  None. FINDINGS: CT HEAD FINDINGS Brain: There is no acute intracranial hemorrhage. Small acute infarcts in the right MCA territory are better seen on the prior MRI. There is no mass effect. Patchy and confluent areas of hypoattenuation in the supratentorial white matter are nonspecific but probably reflect moderate chronic microvascular ischemic changes. There are chronic right frontal, parietal, and occipital infarcts. Additional chronic left frontoparietal infarcts. Prominence of the ventricles and sulci reflects generalized parenchymal volume loss. Vascular: There is intracranial atherosclerotic calcification at the skull base. Skull: Unremarkable. Sinuses: Minor mucosal thickening. Orbits: Bilateral lens replacements. Review of the MIP images confirms the above findings CTA NECK FINDINGS Aortic arch: Great vessel origins are patent. Right carotid system: Common carotid is patent with mild atherosclerotic wall thickening. There is primarily calcified plaque at the bifurcation and along the proximal ICA. The degree of stenosis is difficult to measure exactly due to extensive calcification but is at least 80%. The remainder of the cervical ICA is patent. Caliber is slightly smaller than contralateral ICA though this is probably primarily due to absent right A1 ACA intracranially. External carotid is patent. Left carotid system: Common carotid is patent with mild atherosclerotic wall thickening. There is primarily calcified  plaque at the bifurcation and along the proximal ICA causing less than 50% stenosis. Remainder of the cervical ICA is patent. External carotid is patent. Vertebral arteries: Dominant extracranial left vertebral artery is patent. There is calcified plaque at the origin causing stenosis. There is no visible opacification of  the right vertebral artery to the C4 level where there is partial reconstitution. Additional muscular branch contribution is present more distally. Skeleton: Multilevel degenerative changes of the cervical spine. There is prominent retrodental soft tissue effacing the ventral subarachnoid space as seen on prior MRI. Other neck: No neck mass or adenopathy. Upper chest: No apical lung mass. Review of the MIP images confirms the above findings CTA HEAD FINDINGS Anterior circulation: Intracranial internal carotid arteries are patent with calcified plaque but no significant stenosis. Anterior and middle cerebral arteries are patent. 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 Posterior circulation: Intracranial vertebral arteries are patent. There is mild calcified plaque on the left. Basilar artery is patent. Posterior cerebral arteries are patent. A small left posterior communicating artery is noted. Venous sinuses: As permitted by contrast timing, patent. Review of the MIP images confirms the above findings IMPRESSION: No acute intracranial hemorrhage. Small right MCA territory infarctions are better seen on recent MRI. Chronic findings are detailed above. Significant calcified plaque at the right ICA origin causing at least 80% stenosis. Less than 50% stenosis at the left ICA origin. Occluded proximal to mid right vertebral artery extracranially with reconstitution. Electronically Signed   By: Macy Mis M.D.   On: 09/24/2019 12:32   DG Chest 2 View  Result Date: 09/24/2019 CLINICAL DATA:  CVA EXAM: CHEST - 2 VIEW COMPARISON:  October 03, 2013 FINDINGS: There is no appreciable edema or consolidation. Heart is upper normal in size with pulmonary vascularity normal. No adenopathy. There is degenerative change in each shoulder and in the thoracic spine. There is thoracolumbar levoscoliosis. IMPRESSION: No edema or consolidation. Heart upper normal in size. No evident adenopathy. Electronically Signed   By:  Lowella Grip III M.D.   On: 09/24/2019 09:08   CT ANGIO NECK W OR WO CONTRAST  Result Date: 09/24/2019 CLINICAL DATA:  Stroke, follow-up EXAM: CT ANGIOGRAPHY HEAD AND NECK TECHNIQUE: Multidetector CT imaging of the head and neck was performed using the standard protocol during bolus administration of intravenous contrast. Multiplanar CT image reconstructions and MIPs were obtained to evaluate the vascular anatomy. Carotid stenosis measurements (when applicable) are obtained utilizing NASCET criteria, using the distal internal carotid diameter as the denominator. CONTRAST:  113mL OMNIPAQUE IOHEXOL 350 MG/ML SOLN COMPARISON:  None. FINDINGS: CT HEAD FINDINGS Brain: There is no acute intracranial hemorrhage. Small acute infarcts in the right MCA territory are better seen on the prior MRI. There is no mass effect. Patchy and confluent areas of hypoattenuation in the supratentorial white matter are nonspecific but probably reflect moderate chronic microvascular ischemic changes. There are chronic right frontal, parietal, and occipital infarcts. Additional chronic left frontoparietal infarcts. Prominence of the ventricles and sulci reflects generalized parenchymal volume loss. Vascular: There is intracranial atherosclerotic calcification at the skull base. Skull: Unremarkable. Sinuses: Minor mucosal thickening. Orbits: Bilateral lens replacements. Review of the MIP images confirms the above findings CTA NECK FINDINGS Aortic arch: Great vessel origins are patent. Right carotid system: Common carotid is patent with mild atherosclerotic wall thickening. There is primarily calcified plaque at the bifurcation and along the proximal ICA. The degree of stenosis is difficult to measure exactly due to extensive calcification but is at  least 80%. The remainder of the cervical ICA is patent. Caliber is slightly smaller than contralateral ICA though this is probably primarily due to absent right A1 ACA intracranially.  External carotid is patent. Left carotid system: Common carotid is patent with mild atherosclerotic wall thickening. There is primarily calcified plaque at the bifurcation and along the proximal ICA causing less than 50% stenosis. Remainder of the cervical ICA is patent. External carotid is patent. Vertebral arteries: Dominant extracranial left vertebral artery is patent. There is calcified plaque at the origin causing stenosis. There is no visible opacification of the right vertebral artery to the C4 level where there is partial reconstitution. Additional muscular branch contribution is present more distally. Skeleton: Multilevel degenerative changes of the cervical spine. There is prominent retrodental soft tissue effacing the ventral subarachnoid space as seen on prior MRI. Other neck: No neck mass or adenopathy. Upper chest: No apical lung mass. Review of the MIP images confirms the above findings CTA HEAD FINDINGS Anterior circulation: Intracranial internal carotid arteries are patent with calcified plaque but no significant stenosis. Anterior and middle cerebral arteries are patent. 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 Posterior circulation: Intracranial vertebral arteries are patent. There is mild calcified plaque on the left. Basilar artery is patent. Posterior cerebral arteries are patent. A small left posterior communicating artery is noted. Venous sinuses: As permitted by contrast timing, patent. Review of the MIP images confirms the above findings IMPRESSION: No acute intracranial hemorrhage. Small right MCA territory infarctions are better seen on recent MRI. Chronic findings are detailed above. Significant calcified plaque at the right ICA origin causing at least 80% stenosis. Less than 50% stenosis at the left ICA origin. Occluded proximal to mid right vertebral artery extracranially with reconstitution. Electronically Signed   By: Macy Mis M.D.   On: 09/24/2019 12:32   MR BRAIN WO  CONTRAST  Result Date: 09/23/2019 CLINICAL DATA:  Visual changes going on for cervical days. EXAM: MRI HEAD WITHOUT CONTRAST TECHNIQUE: Multiplanar, multiecho pulse sequences of the brain and surrounding structures were obtained without intravenous contrast. COMPARISON:  CT head without contrast 12/17/2011. MR head without contrast 11/08/2010. FINDINGS: Brain: Diffusion-weighted images demonstrate several punctate foci of restricted diffusion within the right MCA distribution. The largest is in the superior right temporal lobe, measuring 5 mm. There is a punctate focus in the right caudate head. There is a punctate focus in a watershed distribution anteriorly on image 32 of series 4. A punctate focus is present in the right parietal lobe, alongside the atrium of the right lateral ventricle. No other focal areas of acute infarction are present. There is no acute infarct involving the visual cortex or optic tracts. Remote infarct is present in the superior right occipital lobe. There is chronic volume loss. Moderate generalized atrophy is present. Remote infarcts are present in the postcentral gyrus bilaterally, prominent right than left. The ventricles are proportionate to the degree of atrophy. No significant extraaxial fluid collection is present. White matter changes extend into the brainstem. The cerebellum is unremarkable. Vascular: Abnormal signal is present in the right vertebral artery suggesting slow or likely occluded flow. Left vertebral artery is the dominant vessel. Basilar artery is patent. Flow is present in the anterior circulation bilaterally. Skull and upper cervical spine: Prominent soft tissue pannus surrounds the odontoid. This narrows the spinal canal less than 10 mm. Upper cervical spinal cord displaced posteriorly. No abnormal cord signal is present. Normal signal is present at C2 and C3. Midline structures  are otherwise unremarkable. Marrow signal is otherwise normal. Sinuses/Orbits: The  paranasal sinuses and mastoid air cells are clear. Lens replacements are noted. Globes and orbits are otherwise within normal limits. IMPRESSION: 1. Multiple punctate foci of acute nonhemorrhagic infarction within the right MCA distribution as described. 2. Remote infarcts of the superior right occipital lobe. 3. No acute infarct involving the occipital cortex or optic tracts. 4. Moderate generalized atrophy and white matter disease likely reflects the sequela of chronic microvascular ischemia. 5. Prominent soft tissue pannus at the odontoid with mild mass effect on the cord but no abnormal cord signal. 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 These results were called by telephone at the time of interpretation on 09/23/2019 at 2:15 pm to provider MARK PERINI, who verbally acknowledged these results. Electronically Signed   By: San Morelle M.D.   On: 09/23/2019 14:16   ECHOCARDIOGRAM COMPLETE  Result Date: 09/24/2019   ECHOCARDIOGRAM REPORT   Patient Name:   [name] Date of Exam: 09/24/2019 Medical Rec #:  [id number]    Height:       69.0 in Accession #:    [id number]   Weight:       173.6 lb Date of Birth:  11/27/1923   BSA:          1.95 m Patient Age:    68 years     BP:           151/95 mmHg Patient Gender: M            HR:           73 bpm. Exam Location:  Inpatient Procedure: 2D Echo, Color Doppler, Cardiac Doppler and Intracardiac            Opacification Agent Indications:    Stroke  History:        Patient has prior history of Echocardiogram examinations, most                 recent 12/12/2016. CAD, Arrythmias:Atrial Fibrillation; Risk                 Factors:Hypertension, Diabetes and Dyslipidemia.  Sonographer:    Raquel Sarna Senior RDCS Referring Phys: [phone] [place]  1. Left ventricular ejection fraction, by visual estimation, is 55 to 60%. The left ventricle has normal function. There is no left ventricular hypertrophy.  2. Indeterminate  diastolic filling due to E-A fusion.  3. The left ventricle has no regional wall motion abnormalities.  4. Global right ventricle has normal systolic function.The right ventricular size is normal. No increase in right ventricular wall thickness.  5. Left atrial size was normal.  6. Right atrial size was normal.  7. The mitral valve is normal in structure. No evidence of mitral valve regurgitation. No evidence of mitral stenosis.  8. The tricuspid valve is grossly normal.  9. The aortic valve is grossly normal. Aortic valve regurgitation is not visualized. No evidence of aortic valve sclerosis or stenosis. 10. The pulmonic valve was not well visualized. Pulmonic valve regurgitation is not visualized. 11. TR signal is inadequate for assessing pulmonary artery systolic pressure. 12. The inferior vena cava is normal in size with greater than 50% respiratory variability, suggesting right atrial pressure of 3 mmHg. FINDINGS  Left Ventricle: Left ventricular ejection fraction, by visual estimation, is 55 to 60%. The left ventricle has normal function. The left ventricle has no regional wall motion abnormalities. There is no left ventricular hypertrophy. Indeterminate diastolic  filling due to E-A fusion. Normal left atrial pressure. Right Ventricle: The right ventricular size is normal. No increase in right ventricular wall thickness. Global RV systolic function is has normal systolic function. Left Atrium: Left atrial size was normal in size. Right Atrium: Right atrial size was normal in size Pericardium: There is no evidence of pericardial effusion. Mitral Valve: The mitral valve is normal in structure. No evidence of mitral valve regurgitation. No evidence of mitral valve stenosis by observation. Tricuspid Valve: The tricuspid valve is grossly normal. Tricuspid valve regurgitation is not demonstrated. Aortic Valve: The aortic valve is grossly normal. Aortic valve regurgitation is not visualized. The aortic valve is  structurally normal, with no evidence of sclerosis or stenosis. Pulmonic Valve: The pulmonic valve was not well visualized. Pulmonic valve regurgitation is not visualized. Pulmonic regurgitation is not visualized. Aorta: The aortic root, ascending aorta and aortic arch are all structurally normal, with no evidence of dilitation or obstruction. Venous: The inferior vena cava is normal in size with greater than 50% respiratory variability, suggesting right atrial pressure of 3 mmHg. IAS/Shunts: No atrial level shunt detected by color flow Doppler. There is no evidence of a patent foramen ovale. No ventricular septal defect is seen or detected. There is no evidence of an atrial septal defect.  LEFT VENTRICLE PLAX 2D LVIDd:         4.30 cm LVIDs:         3.40 cm LV PW:         0.90 cm LV IVS:        1.10 cm LVOT diam:     1.90 cm LV SV:         36 ml LV SV Index:   18.10 LVOT Area:     2.84 cm  RIGHT VENTRICLE RV S prime:     14.40 cm/s TAPSE (M-mode): 2.0 cm LEFT ATRIUM             Index       RIGHT ATRIUM           Index LA diam:        3.00 cm 1.54 cm/m  RA Area:     21.00 cm LA Vol (A2C):   79.8 ml 41.02 ml/m RA Volume:   63.10 ml  32.44 ml/m LA Vol (A4C):   46.7 ml 24.01 ml/m LA Biplane Vol: 64.1 ml 32.95 ml/m  AORTIC VALVE LVOT Vmax:   63.30 cm/s LVOT Vmean:  43.700 cm/s LVOT VTI:    0.119 m  AORTA Ao Root diam: 2.40 cm  SHUNTS Systemic VTI:  0.12 m Systemic Diam: 1.90 cm  Dani Gobble Croitoru MD Electronically signed by Sanda Klein MD Signature Date/Time: 09/24/2019/1:50:21 PM    Final     PHYSICAL EXAM Pleasant elderly Caucasian male not in distress.  He has a right carotid bruit. . Afebrile. Head is nontraumatic. Neck is supple without bruit.    Cardiac exam no murmur or gallop. Lungs are clear to auscultation. Distal pulses are well felt. Neurological Exam ;  Awake  Alert oriented x 3. Normal speech and language.eye movements full without nystagmus.fundi were not visualized. Vision acuity and fields  appear normal. Hearing is normal. Palatal movements are normal. Face symmetric. Tongue midline. Normal strength, tone, reflexes and coordination. Normal sensation. Gait deferred.  ASSESSMENT/PLAN [name] is a 85 y.o. male with history of AF not on [facility], prior stroke in 2012 on plavix, CAD, DM, HTN presenting with R eye superior vision  loss, now recovered.    Stroke: incidental R MCA embolic infarcts from likely symptomatic proximal right ICA stenosis.  He also has history of known AF not on AC d/t GIB yrs ago  MRI  Multiple punctate R MCA distribution infarcts. Old superior R occipital infarct. Moderate small vessel disease. Moderate atrophy. Soft tissue pannus at odontoid likely arthritis.   CTA head & neck small right MCA territory infarcts.  Chronic infarcts.  No hemorrhage.  Right ICA 80% stenosis.  Left ICA less than 50% stenosis.  Occluded proximal to mid right extracranial VA with reconstitution.  2D Echo EF 55-60%. No source of embolus . In AF  LDL pending   HgbA1c pending   Lovenox 40 mg sq daily for VTE prophylaxis  clopidogrel 75 mg daily prior to admission, now on clopidogrel 75 mg daily. Anticoagulation with Eliquis recommended.  After elective carotid surgery if patient is no longer high risk for GI bleeding Dr. Leonie Man discussed with pt. and Dr. Tamala Julian  Therapy recommendations:  pending    Disposition:  pending  (resides at [facility] independent living)  Paroxsymal Atrial Fibrillation  Home anticoagulation:  none   Last INR 1.0 . [employer] for [employer], Continue at discharge   Hypertension  Stable . Permissive hypertension (OK if < 220/120) but gradually normalize in 5-7 days . Long-term BP goal normotensive  Hyperlipidemia  Home meds:  crestor 20, resumed in hospital  LDL pending, goal < 70  Continue statin at discharge  Diabetes type II   HgbA1c pending, goal < 7.0  Other Stroke Risk Factors  Advanced age  ETOH use, advised to drink no more than 2  drink(s) a day  Hx stroke/TIA  11/2010 R brain stroke central sulcus/R parietal. Changed aspirin to plavix. Likely embolic, no source found during admission      Coronary artery disease w/ hx MI, PCI w/ stent  Hx GIB  Hx duodenal GIB 11/2010 (duodenal ulcer) while on aspirin and plavix, meds stopped.   EGD 09/23/2013 esophagitis and multiple shallow duodenal ulcerations s/p clip while on aspirin. Transfused 3 units. Repeat EGD 12/26 (massive duodenal ulcer) NEW AF NOTED during this admission   Stable since then. Hgb 15  Start Eliquis today  Dr. Leonie Man to follow up with Dr. Tamala Julian, ? F/u EGD as [facility] day # 0  I have personally obtained history,examined this patient, reviewed notes, independently viewed imaging studies, participated in medical decision making and plan of care.ROS completed by me personally and pertinent positives fully documented  I have made any additions or clarifications directly to the above note.  Patient presented with transient right eye altitudinal vision loss as well as silent right MCA branch infarct on MRI likely from symptomatic proximal right ICA stenosis.  He will benefit with elective right carotid revascularization and hence consult vascular surgery.  He has remote history of A. fib and may also benefit with anticoagulation with Eliquis after carefully weighing the risk benefit given prior history of his GI bleed and advanced age.  Discussed with Dr. Fuller Plan and answered questions.  Greater than 50% time during this 35-minute visit was spent on counseling and coordination of care and discussion with care team Antony Contras, [place] Pager: [phone] 09/24/2019 5:11 PM   To contact Stroke Continuity provider, please refer to [email or web address] After hours, contact General Neurology

## 2019-09-24 NOTE — ED Notes (Signed)
ED TO INPATIENT HANDOFF REPORT  ED Nurse Name and Phone #: Gwyndolyn Saxon [phone]  S Name/Age/Gender [name] 85 y.o. male Room/Bed: 054C/054C  Code Status   Code Status: Full Code  Home/SNF/Other Home Patient oriented to: self, place and situation Is this baseline? Yes   Triage Complete: Triage complete  Chief Complaint Acute right MCA stroke [place]) [I63.511]  Triage Note Pt sent here by PCP due to stroke on MRI that pt had done today. Pt states on Monday he had right eye blurriness that has now improved. No other neuro symptoms noted. Pt a.o.    Allergies Allergies  Allergen Reactions  . Adhesive [Tape] Rash    Zoll pads adhesive    Level of Care/Admitting Diagnosis ED Disposition    ED Disposition Condition Comment   Admit  Hospital Area: [facility] [[id number]]  Level of Care: Telemetry Medical [104]  Covid Evaluation: Asymptomatic Screening Protocol (No Symptoms)  Diagnosis: Acute right MCA stroke [facility]) [id number]  Admitting Physician: Norval Morton [id number]  Attending Physician: Norval Morton [id number]  Estimated length of stay: past midnight tomorrow  Certification:: I certify this patient will need inpatient services for at least 2 midnights       B Medical/Surgery History Past Medical History:  Diagnosis Date  . Anemia   . Arthritis   . CAD S/P percutaneous coronary angioplasty    a. 11/2016: anterior STEMI s/p DES to LAD.   . Diabetes mellitus without complication ([place])    Diet controlled  . GERD (gastroesophageal reflux disease)   . GI bleed    10/2013: 2/2 duodenal ulcer. ASA/plavix discontinued.   . Hyperlipemia   . Hypertension   . PAF (paroxysmal atrial fibrillation) ([place])    a. noted during admission in 10/2013 for acute GI bleed. No recurrunce since. Monitor in 2014 with only PAC/PVCs  . ST elevation myocardial infarction (STEMI) involving left anterior descending (LAD) coronary artery with complication ([place])  [id number]   - 99% subtotal occlusion of LAD treated with DES stent.  . Stroke [facility])    a. ichemic CVA in 2012.    Past Surgical History:  Procedure Laterality Date  . CORONARY STENT INTERVENTION N/A 12/11/2016   Procedure: Coronary Stent Intervention-Mid LAD;  Surgeon: Leonie Man, MD;  Location: [place] CV LAB;  Service: Cardiovascular:  mLAD 99%-0% Synergy DES 2.75 x 16 (3.0 mm)  . ESOPHAGOGASTRODUODENOSCOPY N/A 09/23/2013   Procedure: ESOPHAGOGASTRODUODENOSCOPY (EGD);  Surgeon: Lafayette Dragon, MD;  Location: [facility] ENDOSCOPY;  Service: Endoscopy;  Laterality: N/A;  . ESOPHAGOGASTRODUODENOSCOPY N/A 09/25/2013   Procedure: ESOPHAGOGASTRODUODENOSCOPY (EGD);  Surgeon: Beryle Beams, MD;  Location: [facility] ENDOSCOPY;  Service: Endoscopy;  Laterality: N/A;  . ESOPHAGOGASTRODUODENOSCOPY N/A 12/18/2013   Procedure: ESOPHAGOGASTRODUODENOSCOPY (EGD);  Surgeon: Beryle Beams, MD;  Location: [facility] ENDOSCOPY;  Service: Endoscopy;  Laterality: N/A;  . LEFT HEART CATH AND CORONARY ANGIOGRAPHY N/A 12/11/2016   Procedure: Left Heart Cath and Coronary Angiography;  Surgeon: Leonie Man, MD;  Location: [place] CV LAB;  Service: Cardiovascular: LM 45%. mLAD 99% (PCI), dLAD 40% & 50%, ostD1 - 50%. Ost-prox Cx 60%, OstOM1 60% (Med Rx).  Anteroapical HK - EF 50-55%. Normal LVEDP  . TOTAL HIP ARTHROPLASTY Right 04/28/2013   Procedure: RIGHT TOTAL HIP ARTHROPLASTY ANTERIOR APPROACH;  Surgeon: Mauri Pole, MD;  Location: [facility] ORS;  Service: Orthopedics;  Laterality: Right;  . TRANSTHORACIC ECHOCARDIOGRAM  12/12/2016   Status post anterior STEMI:  Normal LV size with normal  function. EF 60 deficits 5% with no regional wall motion abnormality. GR 2 DD. Mild LA dilation.     A IV Location/Drains/Wounds Patient Lines/Drains/Airways Status   Active Line/Drains/Airways    Name:   Placement date:   Placement time:   Site:   Days:   Peripheral IV 09/24/19 Left Other (Comment)   09/24/19    0931    Other (Comment)    less than 1   Pressure Ulcer 09/23/13 Stage I -  Intact skin with non-blanchable redness of a localized area usually over a bony prominence. Red scral bed non blanching medial   09/23/13    2345     2192   Wound / Incision (Open or Dehisced) 08/13/17 Other (Comment) Arm bleeding noted, skin tag removed on friday    08/13/17    0001    Arm   772          Intake/Output Last 24 hours  Intake/Output Summary (Last 24 hours) at 09/24/2019 2114 Last data filed at 09/24/2019 1735 Gross per 24 hour  Intake --  Output 700 ml  Net -700 ml    Labs/Imaging Results for orders placed or performed during the hospital encounter of 09/23/19 (from the past 48 hour(s))  CBG monitoring, ED     Status: None   Collection Time: 09/23/19  4:54 PM  Result Value Ref Range   Glucose-Capillary 95 70 - 99 mg/dL  Protime-INR     Status: None   Collection Time: 09/23/19  4:55 PM  Result Value Ref Range   Prothrombin Time 13.4 11.4 - 15.2 seconds   INR 1.0 0.8 - 1.2    Comment: (NOTE) INR goal varies based on device and disease states. Performed at [facility], [place] [address], [place], [place] [address]   APTT     Status: None   Collection Time: 09/23/19  4:55 PM  Result Value Ref Range   aPTT 27 24 - 36 seconds    Comment: Performed at [facility] [address] [place], [place] [address]  CBC     Status: Abnormal   Collection Time: 09/23/19  4:55 PM  Result Value Ref Range   WBC 12.2 (H) 4.0 - 10.5 K/uL   RBC 4.39 4.22 - 5.81 MIL/uL   Hemoglobin 14.5 13.0 - 17.0 g/dL   HCT 43.8 39.0 - 52.0 %   MCV 99.8 80.0 - 100.0 fL   MCH 33.0 26.0 - 34.0 pg   MCHC 33.1 30.0 - 36.0 g/dL   RDW 13.3 11.5 - 15.5 %   Platelets 263 150 - 400 K/uL   nRBC 0.0 0.0 - 0.2 %    Comment: Performed at [facility], [place] [address] [place], [place] [address]  Differential     Status: Abnormal   Collection Time: 09/23/19  4:55 PM  Result Value Ref Range   Neutrophils Relative % 65 %   Neutro Abs  7.9 (H) 1.7 - 7.7 K/uL   Lymphocytes Relative 21 %   Lymphs Abs 2.5 0.7 - 4.0 K/uL   Monocytes Relative 12 %   Monocytes Absolute 1.5 (H) 0.1 - 1.0 K/uL   Eosinophils Relative 1 %   Eosinophils Absolute 0.2 0.0 - 0.5 K/uL   Basophils Relative 1 %   Basophils Absolute 0.1 0.0 - 0.1 K/uL   Immature Granulocytes 0 %   Abs Immature Granulocytes 0.04 0.00 - 0.07 K/uL    Comment: Performed at [facility]  [facility], [address], [place], [place] [address]  Comprehensive metabolic panel     Status: Abnormal   Collection Time: 09/23/19  4:55 PM  Result Value Ref Range   Sodium 143 135 - 145 mmol/L   Potassium 4.3 3.5 - 5.1 mmol/L   Chloride 111 98 - 111 mmol/L   CO2 21 (L) 22 - 32 mmol/L   Glucose, Bld 102 (H) 70 - 99 mg/dL   BUN 19 8 - 23 mg/dL   Creatinine, Ser 1.19 0.61 - 1.24 mg/dL   Calcium 9.9 8.9 - 10.3 mg/dL   Total Protein 6.5 6.5 - 8.1 g/dL   Albumin 3.8 3.5 - 5.0 g/dL   AST 29 15 - 41 U/L   ALT 20 0 - 44 U/L   Alkaline Phosphatase 45 38 - 126 U/L   Total Bilirubin 1.5 (H) 0.3 - 1.2 mg/dL   GFR calc non Af Amer 53 (L) >60 mL/min   GFR calc Af Amer >60 >60 mL/min   Anion gap 11 5 - 15    Comment: Performed at [facility] [address], [place], [place] [address]  SARS CORONAVIRUS 2 (TAT 6-24 HRS) Nasopharyngeal Nasopharyngeal Swab     Status: None   Collection Time: 09/24/19  7:34 AM   Specimen: Nasopharyngeal Swab  Result Value Ref Range   SARS Coronavirus 2 NEGATIVE NEGATIVE    Comment: (NOTE) SARS-CoV-2 target nucleic acids are NOT DETECTED. The SARS-CoV-2 RNA is generally detectable in upper and lower respiratory specimens during the acute phase of infection. Negative results do not preclude SARS-CoV-2 infection, do not rule out co-infections with other pathogens, and should not be used as the sole basis for treatment or other patient management decisions. Negative results must be combined with clinical observations, patient history, and epidemiological  information. The expected result is Negative. Fact Sheet for Patients: SugarRoll.be Fact Sheet for Healthcare Providers: https://www.woods-mathews.com/ This test is not yet approved or cleared by the [place] FDA and  has been authorized for detection and/or diagnosis of SARS-CoV-2 by FDA under an Emergency Use Authorization (EUA). This EUA will remain  in effect (meaning this test can be used) for the duration of the COVID-19 declaration under Section 56 4(b)(1) of the Act, 21 U.S.C. section 360bbb-3(b)(1), unless the authorization is terminated or revoked sooner. Performed at [facility], [place] [address] [place], [place] [address]   Comprehensive metabolic panel     Status: Abnormal   Collection Time: 09/24/19  9:30 AM  Result Value Ref Range   Sodium 144 135 - 145 mmol/L   Potassium 4.5 3.5 - 5.1 mmol/L   Chloride 109 98 - 111 mmol/L   CO2 21 (L) 22 - 32 mmol/L   Glucose, Bld 87 70 - 99 mg/dL   BUN 19 8 - 23 mg/dL   Creatinine, Ser 1.04 0.61 - 1.24 mg/dL   Calcium 9.7 8.9 - 10.3 mg/dL   Total Protein 6.7 6.5 - 8.1 g/dL   Albumin 3.9 3.5 - 5.0 g/dL   AST 27 15 - 41 U/L   ALT 20 0 - 44 U/L   Alkaline Phosphatase 41 38 - 126 U/L   Total Bilirubin 1.5 (H) 0.3 - 1.2 mg/dL   GFR calc non Af Amer >60 >60 mL/min   GFR calc Af Amer >60 >60 mL/min   Anion gap 14 5 - 15    Comment: Performed at [facility] [address] [place], [place] [address]  CBC     Status: Abnormal   Collection Time: 09/24/19  9:30 AM  Result Value Ref Range   WBC 11.6 (H) 4.0 - 10.5 K/uL   RBC 4.52 4.22 - 5.81 MIL/uL   Hemoglobin 15.1 13.0 - 17.0 g/dL   HCT 44.7 39.0 - 52.0 %   MCV 98.9 80.0 - 100.0 fL   MCH 33.4 26.0 - 34.0 pg   MCHC 33.8 30.0 - 36.0 g/dL   RDW 13.3 11.5 - 15.5 %   Platelets 257 150 - 400 K/uL   nRBC 0.0 0.0 - 0.2 %    Comment: Performed at [facility], [place] [address], [place], [place] [address]  TSH     Status:  None   Collection Time: 09/24/19  9:33 AM  Result Value Ref Range   TSH 1.451 0.350 - 4.500 uIU/mL    Comment: Performed by a 3rd Generation assay with a functional sensitivity of <=0.01 uIU/mL. Performed at [facility], [place] [address], [place], [place] [address]   Urinalysis, Routine w reflex microscopic     Status: Abnormal   Collection Time: 09/24/19 12:22 PM  Result Value Ref Range   Color, Urine YELLOW YELLOW   APPearance CLOUDY (A) CLEAR   Specific Gravity, Urine 1.017 1.005 - 1.030   pH 6.0 5.0 - 8.0   Glucose, UA >=500 (A) NEGATIVE mg/dL   Hgb urine dipstick NEGATIVE NEGATIVE   Bilirubin Urine NEGATIVE NEGATIVE   Ketones, ur 20 (A) NEGATIVE mg/dL   Protein, ur NEGATIVE NEGATIVE mg/dL   Nitrite NEGATIVE NEGATIVE   Leukocytes,Ua LARGE (A) NEGATIVE   RBC / HPF 0-5 0 - 5 RBC/hpf   WBC, UA >50 (H) 0 - 5 WBC/hpf   Bacteria, UA FEW (A) NONE SEEN   WBC Clumps PRESENT     Comment: Performed at [facility], [address] [place], [place] [address]  CBG monitoring, ED     Status: Abnormal   Collection Time: 09/24/19 12:28 PM  Result Value Ref Range   Glucose-Capillary 121 (H) 70 - 99 mg/dL   Comment 1 Notify RN    Comment 2 Document in Chart   CBG monitoring, ED     Status: Abnormal   Collection Time: 09/24/19  3:26 PM  Result Value Ref Range   Glucose-Capillary 155 (H) 70 - 99 mg/dL  CBG monitoring, ED     Status: Abnormal   Collection Time: 09/24/19  5:41 PM  Result Value Ref Range   Glucose-Capillary 124 (H) 70 - 99 mg/dL   CT ANGIO HEAD W OR WO CONTRAST  Result Date: 09/24/2019 CLINICAL DATA:  Stroke, follow-up EXAM: CT ANGIOGRAPHY HEAD AND NECK TECHNIQUE: Multidetector CT imaging of the head and neck was performed using the standard protocol during bolus administration of intravenous contrast. Multiplanar CT image reconstructions and MIPs were obtained to evaluate the vascular anatomy. Carotid stenosis measurements (when applicable) are obtained utilizing  NASCET criteria, using the distal internal carotid diameter as the denominator. CONTRAST:  127mL OMNIPAQUE IOHEXOL 350 MG/ML SOLN COMPARISON:  None. FINDINGS: CT HEAD FINDINGS Brain: There is no acute intracranial hemorrhage. Small acute infarcts in the right MCA territory are better seen on the prior MRI. There is no mass effect. Patchy and confluent areas of hypoattenuation in the supratentorial white matter are nonspecific but probably reflect moderate chronic microvascular ischemic changes. There are chronic right frontal, parietal, and occipital infarcts. Additional chronic left frontoparietal infarcts. Prominence of the ventricles and sulci  reflects generalized parenchymal volume loss. Vascular: There is intracranial atherosclerotic calcification at the skull base. Skull: Unremarkable. Sinuses: Minor mucosal thickening. Orbits: Bilateral lens replacements. Review of the MIP images confirms the above findings CTA NECK FINDINGS Aortic arch: Great vessel origins are patent. Right carotid system: Common carotid is patent with mild atherosclerotic wall thickening. There is primarily calcified plaque at the bifurcation and along the proximal ICA. The degree of stenosis is difficult to measure exactly due to extensive calcification but is at least 80%. The remainder of the cervical ICA is patent. Caliber is slightly smaller than contralateral ICA though this is probably primarily due to absent right A1 ACA intracranially. External carotid is patent. Left carotid system: Common carotid is patent with mild atherosclerotic wall thickening. There is primarily calcified plaque at the bifurcation and along the proximal ICA causing less than 50% stenosis. Remainder of the cervical ICA is patent. External carotid is patent. Vertebral arteries: Dominant extracranial left vertebral artery is patent. There is calcified plaque at the origin causing stenosis. There is no visible opacification of the right vertebral artery to the  C4 level where there is partial reconstitution. Additional muscular branch contribution is present more distally. Skeleton: Multilevel degenerative changes of the cervical spine. There is prominent retrodental soft tissue effacing the ventral subarachnoid space as seen on prior MRI. Other neck: No neck mass or adenopathy. Upper chest: No apical lung mass. Review of the MIP images confirms the above findings CTA HEAD FINDINGS Anterior circulation: Intracranial internal carotid arteries are patent with calcified plaque but no significant stenosis. Anterior and middle cerebral arteries are patent. 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 Posterior circulation: Intracranial vertebral arteries are patent. There is mild calcified plaque on the left. Basilar artery is patent. Posterior cerebral arteries are patent. A small left posterior communicating artery is noted. Venous sinuses: As permitted by contrast timing, patent. Review of the MIP images confirms the above findings IMPRESSION: No acute intracranial hemorrhage. Small right MCA territory infarctions are better seen on recent MRI. Chronic findings are detailed above. Significant calcified plaque at the right ICA origin causing at least 80% stenosis. Less than 50% stenosis at the left ICA origin. Occluded proximal to mid right vertebral artery extracranially with reconstitution. Electronically Signed   By: Macy Mis M.D.   On: 09/24/2019 12:32   DG Chest 2 View  Result Date: 09/24/2019 CLINICAL DATA:  CVA EXAM: CHEST - 2 VIEW COMPARISON:  October 03, 2013 FINDINGS: There is no appreciable edema or consolidation. Heart is upper normal in size with pulmonary vascularity normal. No adenopathy. There is degenerative change in each shoulder and in the thoracic spine. There is thoracolumbar levoscoliosis. IMPRESSION: No edema or consolidation. Heart upper normal in size. No evident adenopathy. Electronically Signed   By: Lowella Grip III M.D.   On:  09/24/2019 09:08   CT ANGIO NECK W OR WO CONTRAST  Result Date: 09/24/2019 CLINICAL DATA:  Stroke, follow-up EXAM: CT ANGIOGRAPHY HEAD AND NECK TECHNIQUE: Multidetector CT imaging of the head and neck was performed using the standard protocol during bolus administration of intravenous contrast. Multiplanar CT image reconstructions and MIPs were obtained to evaluate the vascular anatomy. Carotid stenosis measurements (when applicable) are obtained utilizing NASCET criteria, using the distal internal carotid diameter as the denominator. CONTRAST:  156mL OMNIPAQUE IOHEXOL 350 MG/ML SOLN COMPARISON:  None. FINDINGS: CT HEAD FINDINGS Brain: There is no acute intracranial hemorrhage. Small acute infarcts in the right MCA territory are better seen on  the prior MRI. There is no mass effect. Patchy and confluent areas of hypoattenuation in the supratentorial white matter are nonspecific but probably reflect moderate chronic microvascular ischemic changes. There are chronic right frontal, parietal, and occipital infarcts. Additional chronic left frontoparietal infarcts. Prominence of the ventricles and sulci reflects generalized parenchymal volume loss. Vascular: There is intracranial atherosclerotic calcification at the skull base. Skull: Unremarkable. Sinuses: Minor mucosal thickening. Orbits: Bilateral lens replacements. Review of the MIP images confirms the above findings CTA NECK FINDINGS Aortic arch: Great vessel origins are patent. Right carotid system: Common carotid is patent with mild atherosclerotic wall thickening. There is primarily calcified plaque at the bifurcation and along the proximal ICA. The degree of stenosis is difficult to measure exactly due to extensive calcification but is at least 80%. The remainder of the cervical ICA is patent. Caliber is slightly smaller than contralateral ICA though this is probably primarily due to absent right A1 ACA intracranially. External carotid is patent. Left  carotid system: Common carotid is patent with mild atherosclerotic wall thickening. There is primarily calcified plaque at the bifurcation and along the proximal ICA causing less than 50% stenosis. Remainder of the cervical ICA is patent. External carotid is patent. Vertebral arteries: Dominant extracranial left vertebral artery is patent. There is calcified plaque at the origin causing stenosis. There is no visible opacification of the right vertebral artery to the C4 level where there is partial reconstitution. Additional muscular branch contribution is present more distally. Skeleton: Multilevel degenerative changes of the cervical spine. There is prominent retrodental soft tissue effacing the ventral subarachnoid space as seen on prior MRI. Other neck: No neck mass or adenopathy. Upper chest: No apical lung mass. Review of the MIP images confirms the above findings CTA HEAD FINDINGS Anterior circulation: Intracranial internal carotid arteries are patent with calcified plaque but no significant stenosis. Anterior and middle cerebral arteries are patent. 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 Posterior circulation: Intracranial vertebral arteries are patent. There is mild calcified plaque on the left. Basilar artery is patent. Posterior cerebral arteries are patent. A small left posterior communicating artery is noted. Venous sinuses: As permitted by contrast timing, patent. Review of the MIP images confirms the above findings IMPRESSION: No acute intracranial hemorrhage. Small right MCA territory infarctions are better seen on recent MRI. Chronic findings are detailed above. Significant calcified plaque at the right ICA origin causing at least 80% stenosis. Less than 50% stenosis at the left ICA origin. Occluded proximal to mid right vertebral artery extracranially with reconstitution. Electronically Signed   By: Macy Mis M.D.   On: 09/24/2019 12:32   MR BRAIN WO CONTRAST  Result Date:  09/23/2019 CLINICAL DATA:  Visual changes going on for cervical days. EXAM: MRI HEAD WITHOUT CONTRAST TECHNIQUE: Multiplanar, multiecho pulse sequences of the brain and surrounding structures were obtained without intravenous contrast. COMPARISON:  CT head without contrast 12/17/2011. MR head without contrast 11/08/2010. FINDINGS: Brain: Diffusion-weighted images demonstrate several punctate foci of restricted diffusion within the right MCA distribution. The largest is in the superior right temporal lobe, measuring 5 mm. There is a punctate focus in the right caudate head. There is a punctate focus in a watershed distribution anteriorly on image 32 of series 4. A punctate focus is present in the right parietal lobe, alongside the atrium of the right lateral ventricle. No other focal areas of acute infarction are present. There is no acute infarct involving the visual cortex or optic tracts. Remote infarct is present  in the superior right occipital lobe. There is chronic volume loss. Moderate generalized atrophy is present. Remote infarcts are present in the postcentral gyrus bilaterally, prominent right than left. The ventricles are proportionate to the degree of atrophy. No significant extraaxial fluid collection is present. White matter changes extend into the brainstem. The cerebellum is unremarkable. Vascular: Abnormal signal is present in the right vertebral artery suggesting slow or likely occluded flow. Left vertebral artery is the dominant vessel. Basilar artery is patent. Flow is present in the anterior circulation bilaterally. Skull and upper cervical spine: Prominent soft tissue pannus surrounds the odontoid. This narrows the spinal canal less than 10 mm. Upper cervical spinal cord displaced posteriorly. No abnormal cord signal is present. Normal signal is present at C2 and C3. Midline structures are otherwise unremarkable. Marrow signal is otherwise normal. Sinuses/Orbits: The paranasal sinuses and  mastoid air cells are clear. Lens replacements are noted. Globes and orbits are otherwise within normal limits. IMPRESSION: 1. Multiple punctate foci of acute nonhemorrhagic infarction within the right MCA distribution as described. 2. Remote infarcts of the superior right occipital lobe. 3. No acute infarct involving the occipital cortex or optic tracts. 4. Moderate generalized atrophy and white matter disease likely reflects the sequela of chronic microvascular ischemia. 5. Prominent soft tissue pannus at the odontoid with mild mass effect on the cord but no abnormal cord signal. 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 These results were called by telephone at the time of interpretation on 09/23/2019 at 2:15 pm to provider MARK PERINI, who verbally acknowledged these results. Electronically Signed   By: San Morelle M.D.   On: 09/23/2019 14:16   ECHOCARDIOGRAM COMPLETE  Result Date: 09/24/2019   ECHOCARDIOGRAM REPORT   Patient Name:   [name] Date of Exam: 09/24/2019 Medical Rec #:  [id number]    Height:       69.0 in Accession #:    [id number]   Weight:       173.6 lb Date of Birth:  November 21, 1923   BSA:          1.95 m Patient Age:    62 years     BP:           151/95 mmHg Patient Gender: M            HR:           73 bpm. Exam Location:  Inpatient Procedure: 2D Echo, Color Doppler, Cardiac Doppler and Intracardiac            Opacification Agent Indications:    Stroke  History:        Patient has prior history of Echocardiogram examinations, most                 recent 12/12/2016. CAD, Arrythmias:Atrial Fibrillation; Risk                 Factors:Hypertension, Diabetes and Dyslipidemia.  Sonographer:    Raquel Sarna Senior RDCS Referring Phys: [phone] [place]  1. Left ventricular ejection fraction, by visual estimation, is 55 to 60%. The left ventricle has normal function. There is no left ventricular hypertrophy.  2. Indeterminate diastolic filling due to E-A  fusion.  3. The left ventricle has no regional wall motion abnormalities.  4. Global right ventricle has normal systolic function.The right ventricular size is normal. No increase in right ventricular wall thickness.  5. Left atrial size was normal.  6. Right atrial size was normal.  7. The mitral valve is normal in structure. No evidence of mitral valve regurgitation. No evidence of mitral stenosis.  8. The tricuspid valve is grossly normal.  9. The aortic valve is grossly normal. Aortic valve regurgitation is not visualized. No evidence of aortic valve sclerosis or stenosis. 10. The pulmonic valve was not well visualized. Pulmonic valve regurgitation is not visualized. 11. TR signal is inadequate for assessing pulmonary artery systolic pressure. 12. The inferior vena cava is normal in size with greater than 50% respiratory variability, suggesting right atrial pressure of 3 mmHg. FINDINGS  Left Ventricle: Left ventricular ejection fraction, by visual estimation, is 55 to 60%. The left ventricle has normal function. The left ventricle has no regional wall motion abnormalities. There is no left ventricular hypertrophy. Indeterminate diastolic filling due to E-A fusion. Normal left atrial pressure. Right Ventricle: The right ventricular size is normal. No increase in right ventricular wall thickness. Global RV systolic function is has normal systolic function. Left Atrium: Left atrial size was normal in size. Right Atrium: Right atrial size was normal in size Pericardium: There is no evidence of pericardial effusion. Mitral Valve: The mitral valve is normal in structure. No evidence of mitral valve regurgitation. No evidence of mitral valve stenosis by observation. Tricuspid Valve: The tricuspid valve is grossly normal. Tricuspid valve regurgitation is not demonstrated. Aortic Valve: The aortic valve is grossly normal. Aortic valve regurgitation is not visualized. The aortic valve is structurally normal, with no  evidence of sclerosis or stenosis. Pulmonic Valve: The pulmonic valve was not well visualized. Pulmonic valve regurgitation is not visualized. Pulmonic regurgitation is not visualized. Aorta: The aortic root, ascending aorta and aortic arch are all structurally normal, with no evidence of dilitation or obstruction. Venous: The inferior vena cava is normal in size with greater than 50% respiratory variability, suggesting right atrial pressure of 3 mmHg. IAS/Shunts: No atrial level shunt detected by color flow Doppler. There is no evidence of a patent foramen ovale. No ventricular septal defect is seen or detected. There is no evidence of an atrial septal defect.  LEFT VENTRICLE PLAX 2D LVIDd:         4.30 cm LVIDs:         3.40 cm LV PW:         0.90 cm LV IVS:        1.10 cm LVOT diam:     1.90 cm LV SV:         36 ml LV SV Index:   18.10 LVOT Area:     2.84 cm  RIGHT VENTRICLE RV S prime:     14.40 cm/s TAPSE (M-mode): 2.0 cm LEFT ATRIUM             Index       RIGHT ATRIUM           Index LA diam:        3.00 cm 1.54 cm/m  RA Area:     21.00 cm LA Vol (A2C):   79.8 ml 41.02 ml/m RA Volume:   63.10 ml  32.44 ml/m LA Vol (A4C):   46.7 ml 24.01 ml/m LA Biplane Vol: 64.1 ml 32.95 ml/m  AORTIC VALVE LVOT Vmax:   63.30 cm/s LVOT Vmean:  43.700 cm/s LVOT VTI:    0.119 m  AORTA Ao Root diam: 2.40 cm  SHUNTS Systemic VTI:  0.12 m Systemic Diam: 1.90 cm  Dani Gobble Croitoru MD Electronically signed by Sanda Klein MD Signature Date/Time: 09/24/2019/1:50:21 PM  Final     Pending Labs Unresulted Labs (From admission, onward)    Start     Ordered   09/25/19 0500  Hemoglobin A1c  Tomorrow morning,   R     09/24/19 0830   09/25/19 0500  Lipid panel  Tomorrow morning,   R    Comments: Fasting    09/24/19 0830   09/25/19 0500  CBC with Differential/Platelet  Tomorrow morning,   R     09/24/19 1040   09/24/19 1814  Occult blood card to lab, stool  Once,   STAT     09/24/19 1813   09/24/19 1357  Urine culture   Add-on,   AD     09/24/19 1356          Vitals/Pain Today's Vitals   09/24/19 1545 09/24/19 1610 09/24/19 1630 09/24/19 1730  BP: (!) 152/84  (!) 143/79 (!) 149/81  Pulse: 83  77 86  Resp: 14  14 15   Temp:      TempSrc:      SpO2: 98%  96% 97%  PainSc:  0-No pain      Isolation Precautions No active isolations  Medications Medications   stroke: mapping our early stages of recovery book (has no administration in time range)  acetaminophen (TYLENOL) tablet 650 mg (has no administration in time range)    Or  acetaminophen (TYLENOL) 160 MG/5ML solution 650 mg (has no administration in time range)    Or  acetaminophen (TYLENOL) suppository 650 mg (has no administration in time range)  senna-docusate (Senokot-S) tablet 1 tablet (has no administration in time range)  albuterol (PROVENTIL) (2.5 MG/3ML) 0.083% nebulizer solution 2.5 mg (has no administration in time range)  insulin aspart (novoLOG) injection 0-9 Units (0 Units Subcutaneous Hold 09/24/19 1759)  rosuvastatin (CRESTOR) tablet 20 mg (20 mg Oral Given 09/24/19 1735)  levothyroxine (SYNTHROID) tablet 50 mcg (has no administration in time range)  pantoprazole (PROTONIX) EC tablet 40 mg (40 mg Oral Given 09/24/19 1418)  insulin glargine (LANTUS) injection 10 Units (has no administration in time range)  perflutren lipid microspheres (DEFINITY) IV suspension (1 mL Intravenous Given 09/24/19 1115)  cefTRIAXone (ROCEPHIN) 1 g in sodium chloride 0.9 % 100 mL IVPB (0 g Intravenous Stopped 09/24/19 1506)  apixaban (ELIQUIS) tablet 5 mg (has no administration in time range)  0.9 %  sodium chloride infusion ( Intravenous Stopped 09/24/19 1528)  iohexol (OMNIPAQUE) 350 MG/ML injection 100 mL (100 mLs Intravenous Contrast Given 09/24/19 1203)    Mobility walks with device Moderate fall risk   Focused Assessments Neuro Assessment Handoff:  Swallow screen pass? Yes  Cardiac Rhythm: Normal sinus rhythm NIH Stroke Scale ( +  Modified Stroke Scale Criteria)  Interval: Shift assessment Level of Consciousness (1a.)   : Alert, keenly responsive LOC Questions (1b. )   +: Answers both questions correctly LOC Commands (1c. )   + : Performs both tasks correctly Best Gaze (2. )  +: Normal Visual (3. )  +: No visual loss Facial Palsy (4. )    : Normal symmetrical movements Motor Arm, Left (5a. )   +: No drift Motor Arm, Right (5b. )   +: No drift Motor Leg, Left (6a. )   +: No drift Motor Leg, Right (6b. )   +: No drift Limb Ataxia (7. ): Absent Sensory (8. )   +: Normal, no sensory loss Best Language (9. )   +: No aphasia Dysarthria (10. ): Normal Extinction/Inattention (11.)   +:  No Abnormality Modified SS Total  +: 0 Complete NIHSS TOTAL: 0     Neuro Assessment: Within Defined Limits Neuro Checks:   Initial (09/24/19 0600)  Last Documented NIHSS Modified Score: 0 (09/24/19 2110) Has TPA been given? No If patient is a Neuro Trauma and patient is going to OR before floor call report to [place] nurse: [phone] or [phone]  , Renal Assessment Handoff:  Hemodialysis Schedule:  Last Hemodialysis date and time:   Restricted appendage:      R Recommendations: See Admitting Provider Note  Report given to:   Additional Notes:Patient from assisted living with independent care; Pt walks with walker; A&Ox 4 and able to use urinal; No c/o pain; NIH is 0 currently-Monique,RN

## 2019-09-24 NOTE — ED Notes (Signed)
Pt Daughter updated.

## 2019-09-24 NOTE — H&P (Addendum)
History and Physical    [name] [id number] DOB: [birth year] DOA: 09/23/2019  Referring MD/NP/PA: Addison Lank, MD PCP: Reynold Bowen, MD  Patient coming from: [facility]   Chief Complaint: Vision changes  I have personally briefly reviewed patient's old medical records in [place]   HPI: [name] is a 85 y.o. male with medical history significant of hypertension, hyperlipidemia, paroxysmal atrial fibrillation on Plavix, CEA, CAD s/p DES to LAD, CVA, anemia, history of GI bleeding.  2-3 days ago, he reported having vision changes in his right eye that resolved the following day.  He went and followed up with his primary care provider who ordered MRI which showed numerous punctate right MCA distribution infarcts.  At this time he has no complaints and reports that his vision is back to normal.  At baseline he ambulates with use of a walker and is right-handed.  He is followed by [facility] neurology already and was noted to have some carotid stenosis that they were following him for. Records note patient had previous GI bleed related with aspirin and Plavix for which for which he has been on Plavix alone.  ED Course: On admission into the emergency department patient was found to be afebrile, blood pressures 147/78-192/89, and all other vital signs maintained.  Labs from 12/23 significant for WBC 12.2 and total bilirubin 1.5.  MRI revealed acute right MCA distribution infarcts.  Neurology was formally consulted and recommended admission for further work-up.  TRH called to admit.   Review of Systems  Constitutional: Negative for fever and malaise/fatigue.  HENT: Negative for ear discharge and nosebleeds.   Eyes: Negative for blurred vision, pain and redness.  Respiratory: Negative for cough and shortness of breath.   Cardiovascular: Negative for chest pain and leg swelling.  Gastrointestinal: Negative for abdominal pain, nausea and vomiting.  Genitourinary: Negative for  dysuria and frequency.  Musculoskeletal: Negative for falls.  Skin: Negative for itching and rash.  Neurological: Negative for speech change, focal weakness and loss of consciousness.  Endo/Heme/Allergies: Bruises/bleeds easily.  Psychiatric/Behavioral: Negative for substance abuse. The patient is not nervous/anxious.     Past Medical History:  Diagnosis Date  . Anemia   . Arthritis   . CAD S/P percutaneous coronary angioplasty    a. 11/2016: anterior STEMI s/p DES to LAD.   . Diabetes mellitus without complication ([place])    Diet controlled  . GERD (gastroesophageal reflux disease)   . GI bleed    10/2013: 2/2 duodenal ulcer. ASA/plavix discontinued.   . Hyperlipemia   . Hypertension   . PAF (paroxysmal atrial fibrillation) ([place])    a. noted during admission in 10/2013 for acute GI bleed. No recurrunce since. Monitor in 2014 with only PAC/PVCs  . ST elevation myocardial infarction (STEMI) involving left anterior descending (LAD) coronary artery with complication ([place]) [id number]   - 99% subtotal occlusion of LAD treated with DES stent.  . Stroke [facility])    a. ichemic CVA in 2012.     Past Surgical History:  Procedure Laterality Date  . CORONARY STENT INTERVENTION N/A 12/11/2016   Procedure: Coronary Stent Intervention-Mid LAD;  Surgeon: Leonie Man, MD;  Location: [place] CV LAB;  Service: Cardiovascular:  mLAD 99%-0% Synergy DES 2.75 x 16 (3.0 mm)  . ESOPHAGOGASTRODUODENOSCOPY N/A 09/23/2013   Procedure: ESOPHAGOGASTRODUODENOSCOPY (EGD);  Surgeon: Lafayette Dragon, MD;  Location: [facility] ENDOSCOPY;  Service: Endoscopy;  Laterality: N/A;  . ESOPHAGOGASTRODUODENOSCOPY N/A 09/25/2013   Procedure: ESOPHAGOGASTRODUODENOSCOPY (EGD);  Surgeon:  Beryle Beams, MD;  Location: [place];  Service: Endoscopy;  Laterality: N/A;  . ESOPHAGOGASTRODUODENOSCOPY N/A 12/18/2013   Procedure: ESOPHAGOGASTRODUODENOSCOPY (EGD);  Surgeon: Beryle Beams, MD;  Location: [facility];  Service: Endoscopy;   Laterality: N/A;  . LEFT HEART CATH AND CORONARY ANGIOGRAPHY N/A 12/11/2016   Procedure: Left Heart Cath and Coronary Angiography;  Surgeon: Leonie Man, MD;  Location: [place] CV LAB;  Service: Cardiovascular: LM 45%. mLAD 99% (PCI), dLAD 40% & 50%, ostD1 - 50%. Ost-prox Cx 60%, OstOM1 60% (Med Rx).  Anteroapical HK - EF 50-55%. Normal LVEDP  . TOTAL HIP ARTHROPLASTY Right 04/28/2013   Procedure: RIGHT TOTAL HIP ARTHROPLASTY ANTERIOR APPROACH;  Surgeon: Mauri Pole, MD;  Location: [facility] ORS;  Service: Orthopedics;  Laterality: Right;  . TRANSTHORACIC ECHOCARDIOGRAM  12/12/2016   Status post anterior STEMI:  Normal LV size with normal function. EF 60 deficits 5% with no regional wall motion abnormality. GR 2 DD. Mild LA dilation.     reports that he has never smoked. He has never used smokeless tobacco. He reports current alcohol use of about 3.0 standard drinks of alcohol per week. He reports that he does not use drugs.  Allergies  Allergen Reactions  . Adhesive [Tape] Rash    Zoll pads adhesive    No family history on file.  Prior to Admission medications   Medication Sig Start Date End Date Taking? Authorizing Provider  clopidogrel (PLAVIX) 75 MG tablet Take 1 tablet (75 mg total) by mouth daily with breakfast. 09/21/19   Leonie Man, MD  ergocalciferol (VITAMIN D2) 50000 UNITS capsule Take 50,000 Units by mouth once a week. Patient takes on Mondays    [provider]  fenofibrate 54 MG tablet Take 1 tablet (54 mg total) by mouth daily. 12/31/16   Almyra Deforest, PA  glimepiride (AMARYL) 2 MG tablet Take 1 tablet by mouth daily. 06/03/18   [provider]  LANTUS SOLOSTAR 100 UNIT/ML Solostar Pen Inject 10 Units into the skin daily. 04/23/18   [provider]  levothyroxine (SYNTHROID, LEVOTHROID) 50 MCG tablet Take 50 mcg by mouth daily before breakfast.    [provider]  metoprolol tartrate (LOPRESSOR) 25 MG tablet Take 1 tablet (25 mg total) by  mouth 2 (two) times daily. 04/10/19   Leonie Man, MD  nitroGLYCERIN (NITROSTAT) 0.4 MG SL tablet Place 1 tablet (0.4 mg total) under the tongue every 5 (five) minutes x 3 doses as needed for chest pain. 12/13/16   Eileen Stanford, PA-C  pantoprazole (PROTONIX) 40 MG tablet TAKE 1 TABLET ONCE DAILY. 12/10/18   Leonie Man, MD  rosuvastatin (CRESTOR) 20 MG tablet Take 20 mg by mouth every morning.     [provider]    Physical Exam:  Constitutional: Elderly male in NAD  Vitals:   09/24/19 0426 09/24/19 0546 09/24/19 0600 09/24/19 0615  BP: (!) 159/97 (!) 192/89 (!) 169/99 (!) 188/81  Pulse: 65 76 73 78  Resp: 18 15 17 15   Temp: (!) 97.5 F (36.4 C)     TempSrc: Oral     SpO2: 99% 100% 100% 100%   Eyes: PERRL, lids and conjunctivae normal ENMT: Mucous membranes are moist. Posterior pharynx clear of any exudate or lesions.  Neck: normal, supple, no masses, no thyromegaly Respiratory: clear to auscultation bilaterally, no wheezing, no crackles. Normal respiratory effort. No accessory muscle use.  Cardiovascular: Regular rate and rhythm, no murmurs / rubs / gallops. No  extremity edema. 2+ pedal pulses. No carotid bruits.  Abdomen: no tenderness, no masses palpated. No hepatosplenomegaly. Bowel sounds positive.  Musculoskeletal: no clubbing / cyanosis. No joint deformity upper and lower extremities. Good ROM, no contractures. Normal muscle tone.  Skin: no rashes, lesions, ulcers. No induration Neurologic: CN 2-12 grossly intact. Sensation intact, DTR normal. Strength 5/5 in all 4.  Psychiatric: Normal judgment and insight. Alert and oriented x 3. Normal mood.     Labs on Admission: I have personally reviewed following labs and imaging studies  CBC: Recent Labs  Lab 09/23/19 1655  WBC 12.2*  NEUTROABS 7.9*  HGB 14.5  HCT 43.8  MCV 99.8  PLT 99991111   Basic Metabolic Panel: Recent Labs  Lab 09/23/19 1655  NA 143  K 4.3  CL 111  CO2 21*  GLUCOSE 102*    BUN 19  CREATININE 1.19  CALCIUM 9.9   GFR: CrCl cannot be calculated (Unknown ideal weight.). Liver Function Tests: Recent Labs  Lab 09/23/19 1655  AST 29  ALT 20  ALKPHOS 45  BILITOT 1.5*  PROT 6.5  ALBUMIN 3.8   No results for input(s): LIPASE, AMYLASE in the last 168 hours. No results for input(s): AMMONIA in the last 168 hours. Coagulation Profile: Recent Labs  Lab 09/23/19 1655  INR 1.0   Cardiac Enzymes: No results for input(s): CKTOTAL, CKMB, CKMBINDEX, TROPONINI in the last 168 hours. BNP (last 3 results) No results for input(s): PROBNP in the last 8760 hours. HbA1C: No results for input(s): HGBA1C in the last 72 hours. CBG: Recent Labs  Lab 09/23/19 1654  GLUCAP 95   Lipid Profile: No results for input(s): CHOL, HDL, LDLCALC, TRIG, CHOLHDL, LDLDIRECT in the last 72 hours. Thyroid Function Tests: No results for input(s): TSH, T4TOTAL, FREET4, T3FREE, THYROIDAB in the last 72 hours. Anemia Panel: No results for input(s): VITAMINB12, FOLATE, FERRITIN, TIBC, IRON, RETICCTPCT in the last 72 hours. Urine analysis:    Component Value Date/Time   COLORURINE YELLOW 10/03/2013 1430   APPEARANCEUR CLOUDY (A) 10/03/2013 1430   LABSPEC 1.015 10/03/2013 1430   PHURINE 5.5 10/03/2013 1430   GLUCOSEU NEGATIVE 10/03/2013 1430   HGBUR NEGATIVE 10/03/2013 1430   BILIRUBINUR NEGATIVE 10/03/2013 1430   KETONESUR NEGATIVE 10/03/2013 1430   PROTEINUR NEGATIVE 10/03/2013 1430   UROBILINOGEN 1.0 10/03/2013 1430   NITRITE NEGATIVE 10/03/2013 1430   LEUKOCYTESUR NEGATIVE 10/03/2013 1430   Sepsis Labs: No results found for this or any previous visit (from the past 240 hour(s)).   Radiological Exams on Admission: MR BRAIN WO CONTRAST  Result Date: 09/23/2019 CLINICAL DATA:  Visual changes going on for cervical days. EXAM: MRI HEAD WITHOUT CONTRAST TECHNIQUE: Multiplanar, multiecho pulse sequences of the brain and surrounding structures were obtained without  intravenous contrast. COMPARISON:  CT head without contrast 12/17/2011. MR head without contrast 11/08/2010. FINDINGS: Brain: Diffusion-weighted images demonstrate several punctate foci of restricted diffusion within the right MCA distribution. The largest is in the superior right temporal lobe, measuring 5 mm. There is a punctate focus in the right caudate head. There is a punctate focus in a watershed distribution anteriorly on image 32 of series 4. A punctate focus is present in the right parietal lobe, alongside the atrium of the right lateral ventricle. No other focal areas of acute infarction are present. There is no acute infarct involving the visual cortex or optic tracts. Remote infarct is present in the superior right occipital lobe. There is chronic volume loss. Moderate generalized atrophy is present.  Remote infarcts are present in the postcentral gyrus bilaterally, prominent right than left. The ventricles are proportionate to the degree of atrophy. No significant extraaxial fluid collection is present. White matter changes extend into the brainstem. The cerebellum is unremarkable. Vascular: Abnormal signal is present in the right vertebral artery suggesting slow or likely occluded flow. Left vertebral artery is the dominant vessel. Basilar artery is patent. Flow is present in the anterior circulation bilaterally. Skull and upper cervical spine: Prominent soft tissue pannus surrounds the odontoid. This narrows the spinal canal less than 10 mm. Upper cervical spinal cord displaced posteriorly. No abnormal cord signal is present. Normal signal is present at C2 and C3. Midline structures are otherwise unremarkable. Marrow signal is otherwise normal. Sinuses/Orbits: The paranasal sinuses and mastoid air cells are clear. Lens replacements are noted. Globes and orbits are otherwise within normal limits. IMPRESSION: 1. Multiple punctate foci of acute nonhemorrhagic infarction within the right MCA distribution  as described. 2. Remote infarcts of the superior right occipital lobe. 3. No acute infarct involving the occipital cortex or optic tracts. 4. Moderate generalized atrophy and white matter disease likely reflects the sequela of chronic microvascular ischemia. 5. Prominent soft tissue pannus at the odontoid with mild mass effect on the cord but no abnormal cord signal. 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 These results were called by telephone at the time of interpretation on 09/23/2019 at 2:15 pm to provider MARK PERINI, who verbally acknowledged these results. Electronically Signed   By: San Morelle M.D.   On: 09/23/2019 14:16    EKG: Independently reviewed.  Sinus rhythm at 65 bpm with first-degree heart block.  Assessment/Plan Right MCA stroke: Acute.  Patient presents with complaints of visual changes in his right eye 2-3 days ago.  MRI reveals a right MCA distribution stroke.  Risk factors include hypertension, hyperlipidemia, PAF, and prior stroke.  Suspect likely embolic in nature. -Admit to a telemetry bed -Stroke order set initiated -Neuro checks -Check CT angiogram of the head neck -Check echocardiogram  -PT/OT/Speech to eval and treat -Check Hemoglobin A1c and lipid panel in a.m. -Eliquis started per neurology -Appreciate neurology consultative services, will follow-up   Carotid artery stenosis: CTA revealed 80% stenosis of the right internal carotid artery.  Given patient's previous history of stroke on the same side question need of carotid enterectomy and/or need of anticoagulation -Vascular surgery was formally consulted  Leukocytosis: Acute.  On admission WBC elevated 12.2.  Suspect reactive in nature secondary to stroke. -Check chest x-ray and urinalysis -Recheck CBC   Suspected urinary tract infection: UA was abnormal. -Follow-up urine culture -Rocephin IV  Paroxysmal atrial fibrillation: Patient appears to be in sinus rhythm at this  time. CHA2DS2-VASc score = at least 7.  Patient with history of GI bleed as the likely cause of not being on formal anticoagulation. -Follow-up telemetry  Essential hypertension: BP noted to be 147/78-192/89. -Allow for permissive hypertension at this time < 220/< 120 -Restart home blood pressure medications when medically appropriate  Diabetes mellitus type 2: Last available hemoglobin A1c on file was 9.7 from 12/12/2016.  Patient presents with blood glucose elevated up to  -Hypoglycemic protocol -Hold glimepiride and empagliflozin-linagliptin -CBGs before every meal with sensitive SSI -Reduce home regimen of Lantus 15 units down to 10 units while in the hospital    Hypothyroidism -Check TSH -Continue levothyroxine  Hyperbilirubinemia: Acute.  Labs from 12/23 revealed a total bilirubin of 1.5. -Continue to monitor  Dyslipidemia -Follow-up lipid panel -Continue  fenofibrate and atorvastatin  COVID-19 screening in process  DVT prophylaxis: Lovenox Code Status: Full Family Communication: Discussed plan of care with the patient and daughter over the phone Disposition Plan: To be determined Consults called: Neurology  Admission status: Inpatient   Norval Morton MD [facility] Pager [phone]   If 7PM-7AM, please contact night-coverage [email or web address] Password [facility]  09/24/2019, 7:38 AM

## 2019-09-24 NOTE — Consult Note (Signed)
Referring Physician: Dr. Leonette Monarch    Chief Complaint: New stroke on MRI  HPI: [name] is an 85 y.o. male with a history of atrial fibrillation and prior stroke in 2012, on Plavix. Sent in by PCP due to stroke on MRI that patient had done on Wednesday. His symptoms consisted of right eye blurriness that is now asymptomatic. The patient denies any weakness or sensory loss. No difficulty with speech.   Stroke risk factors include CAD, DM, HLD, HTN, PAF and prior stroke.   Past Medical History:  Diagnosis Date  . Anemia   . Arthritis   . CAD S/P percutaneous coronary angioplasty    a. 11/2016: anterior STEMI s/p DES to LAD.   . Diabetes mellitus without complication ([place])    Diet controlled  . GERD (gastroesophageal reflux disease)   . GI bleed    10/2013: 2/2 duodenal ulcer. ASA/plavix discontinued.   . Hyperlipemia   . Hypertension   . PAF (paroxysmal atrial fibrillation) ([place])    a. noted during admission in 10/2013 for acute GI bleed. No recurrunce since. Monitor in 2014 with only PAC/PVCs  . ST elevation myocardial infarction (STEMI) involving left anterior descending (LAD) coronary artery with complication ([place]) [id number]   - 99% subtotal occlusion of LAD treated with DES stent.  . Stroke [facility])    a. ichemic CVA in 2012.     Past Surgical History:  Procedure Laterality Date  . CORONARY STENT INTERVENTION N/A 12/11/2016   Procedure: Coronary Stent Intervention-Mid LAD;  Surgeon: Leonie Man, MD;  Location: [facility];  Service: Cardiovascular:  mLAD 99%-0% Synergy DES 2.75 x 16 (3.0 mm)  . ESOPHAGOGASTRODUODENOSCOPY N/A 09/23/2013   Procedure: ESOPHAGOGASTRODUODENOSCOPY (EGD);  Surgeon: Lafayette Dragon, MD;  Location: [facility] ENDOSCOPY;  Service: Endoscopy;  Laterality: N/A;  . ESOPHAGOGASTRODUODENOSCOPY N/A 09/25/2013   Procedure: ESOPHAGOGASTRODUODENOSCOPY (EGD);  Surgeon: Beryle Beams, MD;  Location: [facility] ENDOSCOPY;  Service: Endoscopy;  Laterality: N/A;  .  ESOPHAGOGASTRODUODENOSCOPY N/A 12/18/2013   Procedure: ESOPHAGOGASTRODUODENOSCOPY (EGD);  Surgeon: Beryle Beams, MD;  Location: [facility];  Service: Endoscopy;  Laterality: N/A;  . LEFT HEART CATH AND CORONARY ANGIOGRAPHY N/A 12/11/2016   Procedure: Left Heart Cath and Coronary Angiography;  Surgeon: Leonie Man, MD;  Location: [facility];  Service: Cardiovascular: LM 45%. mLAD 99% (PCI), dLAD 40% & 50%, ostD1 - 50%. Ost-prox Cx 60%, OstOM1 60% (Med Rx).  Anteroapical HK - EF 50-55%. Normal LVEDP  . TOTAL HIP ARTHROPLASTY Right 04/28/2013   Procedure: RIGHT TOTAL HIP ARTHROPLASTY ANTERIOR APPROACH;  Surgeon: Mauri Pole, MD;  Location: [facility];  Service: Orthopedics;  Laterality: Right;  . TRANSTHORACIC ECHOCARDIOGRAM  12/12/2016   Status post anterior STEMI:  Normal LV size with normal function. EF 60 deficits 5% with no regional wall motion abnormality. GR 2 DD. Mild LA dilation.    No family history on file. Social History:  reports that he has never smoked. He has never used smokeless tobacco. He reports current alcohol use of about 3.0 standard drinks of alcohol per week. He reports that he does not use drugs.  Allergies:  Allergies  Allergen Reactions  . Adhesive [Tape] Rash    Zoll pads adhesive    Home Medications: No current facility-administered medications on file prior to encounter.   Current Outpatient Medications on File Prior to Encounter  Medication Sig Dispense Refill  . clopidogrel (PLAVIX) 75 MG tablet Take 1 tablet (75 mg total) by mouth daily  with breakfast. 90 tablet 0  . ergocalciferol (VITAMIN D2) 50000 UNITS capsule Take 50,000 Units by mouth once a week. Patient takes on Mondays    . fenofibrate 54 MG tablet Take 1 tablet (54 mg total) by mouth daily. 30 tablet 6  . glimepiride (AMARYL) 2 MG tablet Take 1 tablet by mouth daily.    Marland Kitchen LANTUS SOLOSTAR 100 UNIT/ML Solostar Pen Inject 10 Units into the skin daily.    Marland Kitchen levothyroxine (SYNTHROID,  LEVOTHROID) 50 MCG tablet Take 50 mcg by mouth daily before breakfast.    . metoprolol tartrate (LOPRESSOR) 25 MG tablet Take 1 tablet (25 mg total) by mouth 2 (two) times daily. 180 tablet 2  . nitroGLYCERIN (NITROSTAT) 0.4 MG SL tablet Place 1 tablet (0.4 mg total) under the tongue every 5 (five) minutes x 3 doses as needed for chest pain. 25 tablet 12  . pantoprazole (PROTONIX) 40 MG tablet TAKE 1 TABLET ONCE DAILY. 90 tablet 3  . rosuvastatin (CRESTOR) 20 MG tablet Take 20 mg by mouth every morning.        ROS: As per HPI. Does not endorse any additional symptoms.   Physical Examination: Blood pressure (!) 192/89, pulse 76, temperature (!) 97.5 F (36.4 C), temperature source Oral, resp. rate 15, SpO2 100 %.  HEENT: [place]/AT Lungs: Respirations unlabored Ext: No cyanosis or pallor  Neurologic Examination: Mental Status:  Alert, fully oriented, thought content appropriate.  Speech fluent without evidence of aphasia.  Able to follow all commands without difficulty. Cranial Nerves: II:  Visual fields intact all 4 quadrants of each eye. PERRL. III,IV, VI: No ptosis. EOMI. No nystagmus.   V,VII: Smile symmetric, facial temp sensation equal bilaterally VIII: hearing intact to voice IX,X: Palate rises symmetrically XI: Symmetric XII: midline tongue extension  Motor: Right : Upper extremity   5/5    Left:     Upper extremity   5/5  Lower extremity   5/5     Lower extremity   5/5 No pronator drift Sensory: Temp and light touch intact x 4. No extinction.  Deep Tendon Reflexes:  1+ bilateral upper extremities. Unelicitable patellar and achilles reflexes.  Cerebellar: No ataxia with FNF bilaterally Gait: Deferred   Results for orders placed or performed during the hospital encounter of 09/23/19 (from the past 48 hour(s))  CBG monitoring, ED     Status: None   Collection Time: 09/23/19  4:54 PM  Result Value Ref Range   Glucose-Capillary 95 70 - 99 mg/dL  Protime-INR     Status: None    Collection Time: 09/23/19  4:55 PM  Result Value Ref Range   Prothrombin Time 13.4 11.4 - 15.2 seconds   INR 1.0 0.8 - 1.2    Comment: (NOTE) INR goal varies based on device and disease states. Performed at [facility], [place] [address] [place], [place] [address]   APTT     Status: None   Collection Time: 09/23/19  4:55 PM  Result Value Ref Range   aPTT 27 24 - 36 seconds    Comment: Performed at [facility] [address], [place] [address]  CBC     Status: Abnormal   Collection Time: 09/23/19  4:55 PM  Result Value Ref Range   WBC 12.2 (H) 4.0 - 10.5 K/uL   RBC 4.39 4.22 - 5.81 MIL/uL   Hemoglobin 14.5 13.0 - 17.0 g/dL   HCT 43.8 39.0 - 52.0 %   MCV 99.8 80.0 -  100.0 fL   MCH 33.0 26.0 - 34.0 pg   MCHC 33.1 30.0 - 36.0 g/dL   RDW 13.3 11.5 - 15.5 %   Platelets 263 150 - 400 K/uL   nRBC 0.0 0.0 - 0.2 %    Comment: Performed at [facility] [address], [place], [place] [address]  Differential     Status: Abnormal   Collection Time: 09/23/19  4:55 PM  Result Value Ref Range   Neutrophils Relative % 65 %   Neutro Abs 7.9 (H) 1.7 - 7.7 K/uL   Lymphocytes Relative 21 %   Lymphs Abs 2.5 0.7 - 4.0 K/uL   Monocytes Relative 12 %   Monocytes Absolute 1.5 (H) 0.1 - 1.0 K/uL   Eosinophils Relative 1 %   Eosinophils Absolute 0.2 0.0 - 0.5 K/uL   Basophils Relative 1 %   Basophils Absolute 0.1 0.0 - 0.1 K/uL   Immature Granulocytes 0 %   Abs Immature Granulocytes 0.04 0.00 - 0.07 K/uL    Comment: Performed at [facility] [address] [place], [place] [address]  Comprehensive metabolic panel     Status: Abnormal   Collection Time: 09/23/19  4:55 PM  Result Value Ref Range   Sodium 143 135 - 145 mmol/L   Potassium 4.3 3.5 - 5.1 mmol/L   Chloride 111 98 - 111 mmol/L   CO2 21 (L) 22 - 32 mmol/L   Glucose, Bld 102 (H) 70 - 99 mg/dL   BUN 19 8 - 23 mg/dL   Creatinine, Ser 1.19 0.61 - 1.24 mg/dL   Calcium 9.9 8.9 - 10.3 mg/dL   Total  Protein 6.5 6.5 - 8.1 g/dL   Albumin 3.8 3.5 - 5.0 g/dL   AST 29 15 - 41 U/L   ALT 20 0 - 44 U/L   Alkaline Phosphatase 45 38 - 126 U/L   Total Bilirubin 1.5 (H) 0.3 - 1.2 mg/dL   GFR calc non Af Amer 53 (L) >60 mL/min   GFR calc Af Amer >60 >60 mL/min   Anion gap 11 5 - 15    Comment: Performed at [facility] [address] [place], [place] [address]   MR BRAIN WO CONTRAST  Result Date: 09/23/2019 CLINICAL DATA:  Visual changes going on for cervical days. EXAM: MRI HEAD WITHOUT CONTRAST TECHNIQUE: Multiplanar, multiecho pulse sequences of the brain and surrounding structures were obtained without intravenous contrast. COMPARISON:  CT head without contrast 12/17/2011. MR head without contrast 11/08/2010. FINDINGS: Brain: Diffusion-weighted images demonstrate several punctate foci of restricted diffusion within the right MCA distribution. The largest is in the superior right temporal lobe, measuring 5 mm. There is a punctate focus in the right caudate head. There is a punctate focus in a watershed distribution anteriorly on image 32 of series 4. A punctate focus is present in the right parietal lobe, alongside the atrium of the right lateral ventricle. No other focal areas of acute infarction are present. There is no acute infarct involving the visual cortex or optic tracts. Remote infarct is present in the superior right occipital lobe. There is chronic volume loss. Moderate generalized atrophy is present. Remote infarcts are present in the postcentral gyrus bilaterally, prominent right than left. The ventricles are proportionate to the degree of atrophy. No significant extraaxial fluid collection is present. White matter changes extend into the brainstem. The cerebellum is unremarkable. Vascular: Abnormal signal is present in the right vertebral artery suggesting slow or likely  occluded flow. Left vertebral artery is the dominant vessel. Basilar artery is patent. Flow is present in the  anterior circulation bilaterally. Skull and upper cervical spine: Prominent soft tissue pannus surrounds the odontoid. This narrows the spinal canal less than 10 mm. Upper cervical spinal cord displaced posteriorly. No abnormal cord signal is present. Normal signal is present at C2 and C3. Midline structures are otherwise unremarkable. Marrow signal is otherwise normal. Sinuses/Orbits: The paranasal sinuses and mastoid air cells are clear. Lens replacements are noted. Globes and orbits are otherwise within normal limits. IMPRESSION: 1. Multiple punctate foci of acute nonhemorrhagic infarction within the right MCA distribution as described. 2. Remote infarcts of the superior right occipital lobe. 3. No acute infarct involving the occipital cortex or optic tracts. 4. Moderate generalized atrophy and white matter disease likely reflects the sequela of chronic microvascular ischemia. 5. Prominent soft tissue pannus at the odontoid with mild mass effect on the cord but no abnormal cord signal. 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 These results were called by telephone at the time of interpretation on 09/23/2019 at 2:15 pm to provider MARK PERINI, who verbally acknowledged these results. Electronically Signed   By: San Morelle M.D.   On: 09/23/2019 14:16    Assessment: 85 y.o. male with multiple punctate acute strokes in the right MCA distribution.  1. Exam is nonfocal.  2. MRI brain: Multiple punctate foci of acute nonhemorrhagic infarction within the right MCA distribution. Also noted are remote infarcts of the superior right occipital lobe. Moderate generalized atrophy and white matter disease likely reflects the sequela of chronic microvascular ischemia.  3. Incidentally intersected by the MRI imaging planes is a prominent soft tissue pannus at the odontoid with mild mass effect on the cord but no abnormal cord signal. Findings likely reflect changes related to inflammatory  arthritis.  4. Stroke Risk Factors - CAD, DM, HLD, HTN, PAF and prior stroke.   Plan: 1. HgbA1c, fasting lipid panel 2. MRA of the brain without contrast 3. PT consult, OT consult, Speech consult 4. Echocardiogram 5. Carotid dopplers 6. Prophylactic therapy- Add ASA to Plavix. Continue rosuvastatin. Contact PCP to determine if there is a contraindication to anticoagulation for his atrial fibrillation 7. Risk factor modification 8. Telemetry monitoring 9. Frequent neuro checks 10. BP management.    @Electronically  signed: Dr. Kerney Elbe 09/24/2019, 6:22 AM

## 2019-09-24 NOTE — ED Provider Notes (Signed)
[facility] EMERGENCY DEPARTMENT Provider Note  CSN: [id number] Arrival date & time: 09/23/19 1641  Chief Complaint(s) Cerebrovascular Accident  HPI [name] is a 85 y.o. male who presents for new stroke noted on MRI  Monday had right eye blurry vision that resolved the next day. Saw PCP the next day, who ordered MRI showing numerous punctate right MCA infarcts; notable remote right occipital stroke.  He denies any focal weakness or loss of sensation. No gait change, slurred speech, facial drop.  No recent fevers or infections. No CP, SOB, abd pain, N/V, or change in bowel habits.   HPI  Past Medical History Past Medical History:  Diagnosis Date  . Anemia   . Arthritis   . CAD S/P percutaneous coronary angioplasty    a. 11/2016: anterior STEMI s/p DES to LAD.   . Diabetes mellitus without complication ([place])    Diet controlled  . GERD (gastroesophageal reflux disease)   . GI bleed    10/2013: 2/2 duodenal ulcer. ASA/plavix discontinued.   . Hyperlipemia   . Hypertension   . PAF (paroxysmal atrial fibrillation) ([place])    a. noted during admission in 10/2013 for acute GI bleed. No recurrunce since. Monitor in 2014 with only PAC/PVCs  . ST elevation myocardial infarction (STEMI) involving left anterior descending (LAD) coronary artery with complication ([place]) 99991111   - 99% subtotal occlusion of LAD treated with DES stent.  . Stroke [facility])    a. ichemic CVA in 2012.    Patient Active Problem List   Diagnosis Date Noted  . ST elevation myocardial infarction (STEMI) involving left anterior descending (LAD) coronary artery without development of Q waves ([place]) 12/11/2016  . History of GI bleed 12/11/2016  . Dyslipidemia, goal LDL below 70 12/11/2016  . Hypothyroidism 12/11/2016  . CAD S/P percutaneous coronary angioplasty 12/11/2016  . Acute esophagitis 09/28/2013  . Fatty liver 09/28/2013  . Atrial fibrillation with RVR ([place]) 09/28/2013  . Elevated  lipase 09/28/2013  . Essential hypertension 09/28/2013  . Controlled diabetes mellitus type II without complication ([place]) [id number]  . Acute duodenal ulcer with bleeding 09/26/2013  . Pre-syncope 06/16/2013  . Acute blood loss anemia 04/29/2013  . S/P right THA, AA 04/28/2013  . History of stroke 12/12/2010   Home Medication(s) Prior to Admission medications   Medication Sig Start Date End Date Taking? Authorizing Provider  clopidogrel (PLAVIX) 75 MG tablet Take 1 tablet (75 mg total) by mouth daily with breakfast. 09/21/19   Leonie Man, MD  ergocalciferol (VITAMIN D2) 50000 UNITS capsule Take 50,000 Units by mouth once a week. Patient takes on Mondays    [provider]  fenofibrate 54 MG tablet Take 1 tablet (54 mg total) by mouth daily. 12/31/16   Almyra Deforest, PA  glimepiride (AMARYL) 2 MG tablet Take 1 tablet by mouth daily. 06/03/18   [provider]  LANTUS SOLOSTAR 100 UNIT/ML Solostar Pen Inject 10 Units into the skin daily. 04/23/18   [provider]  levothyroxine (SYNTHROID, LEVOTHROID) 50 MCG tablet Take 50 mcg by mouth daily before breakfast.    [provider]  metoprolol tartrate (LOPRESSOR) 25 MG tablet Take 1 tablet (25 mg total) by mouth 2 (two) times daily. 04/10/19   Leonie Man, MD  nitroGLYCERIN (NITROSTAT) 0.4 MG SL tablet Place 1 tablet (0.4 mg total) under the tongue every 5 (five) minutes x 3 doses as needed for chest pain. 12/13/16   Eileen Stanford, PA-C  pantoprazole (Marcellus)  40 MG tablet TAKE 1 TABLET ONCE DAILY. 12/10/18   Leonie Man, MD  rosuvastatin (CRESTOR) 20 MG tablet Take 20 mg by mouth every morning.     [provider]                                                                                                                                    Past Surgical History Past Surgical History:  Procedure Laterality Date  . CORONARY STENT INTERVENTION N/A 12/11/2016   Procedure: Coronary Stent  Intervention-Mid LAD;  Surgeon: Leonie Man, MD;  Location: [facility];  Service: Cardiovascular:  mLAD 99%-0% Synergy DES 2.75 x 16 (3.0 mm)  . ESOPHAGOGASTRODUODENOSCOPY N/A 09/23/2013   Procedure: ESOPHAGOGASTRODUODENOSCOPY (EGD);  Surgeon: Lafayette Dragon, MD;  Location: [facility] ENDOSCOPY;  Service: Endoscopy;  Laterality: N/A;  . ESOPHAGOGASTRODUODENOSCOPY N/A 09/25/2013   Procedure: ESOPHAGOGASTRODUODENOSCOPY (EGD);  Surgeon: Beryle Beams, MD;  Location: [facility] ENDOSCOPY;  Service: Endoscopy;  Laterality: N/A;  . ESOPHAGOGASTRODUODENOSCOPY N/A 12/18/2013   Procedure: ESOPHAGOGASTRODUODENOSCOPY (EGD);  Surgeon: Beryle Beams, MD;  Location: [facility];  Service: Endoscopy;  Laterality: N/A;  . LEFT HEART CATH AND CORONARY ANGIOGRAPHY N/A 12/11/2016   Procedure: Left Heart Cath and Coronary Angiography;  Surgeon: Leonie Man, MD;  Location: [facility];  Service: Cardiovascular: LM 45%. mLAD 99% (PCI), dLAD 40% & 50%, ostD1 - 50%. Ost-prox Cx 60%, OstOM1 60% (Med Rx).  Anteroapical HK - EF 50-55%. Normal LVEDP  . TOTAL HIP ARTHROPLASTY Right 04/28/2013   Procedure: RIGHT TOTAL HIP ARTHROPLASTY ANTERIOR APPROACH;  Surgeon: Mauri Pole, MD;  Location: [facility] ORS;  Service: Orthopedics;  Laterality: Right;  . TRANSTHORACIC ECHOCARDIOGRAM  12/12/2016   Status post anterior STEMI:  Normal LV size with normal function. EF 60 deficits 5% with no regional wall motion abnormality. GR 2 DD. Mild LA dilation.   Family History No family history on file.  Social History Social History   Tobacco Use  . Smoking status: Never Smoker  . Smokeless tobacco: Never Used  Substance Use Topics  . Alcohol use: Yes    Alcohol/week: 3.0 standard drinks    Types: 3 Shots of liquor per week    Comment: wine or vodka   . Drug use: No   Allergies Adhesive [tape]  Review of Systems Review of Systems All other systems are reviewed and are negative for acute change except as noted in the  HPI  Physical Exam Vital Signs  I have reviewed the triage vital signs BP (!) 159/97 (BP Location: Left Arm)   Pulse 65   Temp (!) 97.5 F (36.4 C) (Oral)   Resp 18   SpO2 99%   Physical Exam Vitals reviewed.  Constitutional:      General: He is not in acute distress.    Appearance: He is well-developed. He is not diaphoretic.  HENT:     Head:  Normocephalic and atraumatic.     Nose: Nose normal.  Eyes:     General: No scleral icterus.       Right eye: No discharge.        Left eye: No discharge.     Conjunctiva/sclera: Conjunctivae normal.     Pupils: Pupils are equal, round, and reactive to light.  Cardiovascular:     Rate and Rhythm: Normal rate. Rhythm regularly irregular.     Heart sounds: No murmur. No friction rub. No gallop.   Pulmonary:     Effort: Pulmonary effort is normal. No respiratory distress.     Breath sounds: Normal breath sounds. No stridor. No rales.  Abdominal:     General: There is no distension.     Palpations: Abdomen is soft.     Tenderness: There is no abdominal tenderness.  Musculoskeletal:        General: No tenderness.     Cervical back: Normal range of motion and neck supple.  Skin:    General: Skin is warm and dry.     Findings: No erythema or rash.  Neurological:     Mental Status: He is alert and oriented to person, place, and time.     Comments: Mental Status:  Alert and oriented to person, place, and time.  Attention and concentration normal.  Speech clear.  Recent memory is intact  Cranial Nerves:  II Visual Fields: Intact to confrontation. Visual fields intact. III, IV, VI: Pupils equal and reactive to light and near. Full eye movement without nystagmus  V Facial Sensation: Normal. No weakness of masticatory muscles  VII: No facial weakness or asymmetry  VIII Auditory Acuity: Grossly normal  IX/X: The uvula is midline; the palate elevates symmetrically  XI: Normal sternocleidomastoid and trapezius strength  XII: The tongue  is midline. No atrophy or fasciculations.   Motor System: Muscle Strength: 5/5 and symmetric in the upper and lower extremities. No pronation or drift.  Muscle Tone: Tone and muscle bulk are normal in the upper and lower extremities.   Reflexes: DTRs: 1+ and symmetrical in all four extremities. No Clonus Coordination: Intact finger-to-nose.  Essential tremor.  Sensation: Intact to light touch.  Gait: deferred      ED Results and Treatments Labs (all labs ordered are listed, but only abnormal results are displayed) Labs Reviewed  CBC - Abnormal; Notable for the following components:      Result Value   WBC 12.2 (*)    All other components within normal limits  DIFFERENTIAL - Abnormal; Notable for the following components:   Neutro Abs 7.9 (*)    Monocytes Absolute 1.5 (*)    All other components within normal limits  COMPREHENSIVE METABOLIC PANEL - Abnormal; Notable for the following components:   CO2 21 (*)    Glucose, Bld 102 (*)    Total Bilirubin 1.5 (*)    GFR calc non Af Amer 53 (*)    All other components within normal limits  SARS CORONAVIRUS 2 (TAT 6-24 HRS)  PROTIME-INR  APTT  CBG MONITORING, ED  EKG  EKG Interpretation  Date/Time:  Wednesday September 23 2019 16:57:59 EST Ventricular Rate:  65 PR Interval:  224 QRS Duration: 98 QT Interval:  446 QTC Calculation: 463 R Axis:   61 Text Interpretation: Sinus rhythm with 1st degree A-V block with frequent Premature ventricular complexes Septal infarct , age undetermined Abnormal ECG NO STEMI. Otherwise no significant change Confirmed by Addison Lank [phone]) on 09/24/2019 6:00:42 AM      Radiology MR BRAIN WO CONTRAST  Result Date: 09/23/2019 CLINICAL DATA:  Visual changes going on for cervical days. EXAM: MRI HEAD WITHOUT CONTRAST TECHNIQUE: Multiplanar, multiecho pulse sequences of the  brain and surrounding structures were obtained without intravenous contrast. COMPARISON:  CT head without contrast 12/17/2011. MR head without contrast 11/08/2010. FINDINGS: Brain: Diffusion-weighted images demonstrate several punctate foci of restricted diffusion within the right MCA distribution. The largest is in the superior right temporal lobe, measuring 5 mm. There is a punctate focus in the right caudate head. There is a punctate focus in a watershed distribution anteriorly on image 32 of series 4. A punctate focus is present in the right parietal lobe, alongside the atrium of the right lateral ventricle. No other focal areas of acute infarction are present. There is no acute infarct involving the visual cortex or optic tracts. Remote infarct is present in the superior right occipital lobe. There is chronic volume loss. Moderate generalized atrophy is present. Remote infarcts are present in the postcentral gyrus bilaterally, prominent right than left. The ventricles are proportionate to the degree of atrophy. No significant extraaxial fluid collection is present. White matter changes extend into the brainstem. The cerebellum is unremarkable. Vascular: Abnormal signal is present in the right vertebral artery suggesting slow or likely occluded flow. Left vertebral artery is the dominant vessel. Basilar artery is patent. Flow is present in the anterior circulation bilaterally. Skull and upper cervical spine: Prominent soft tissue pannus surrounds the odontoid. This narrows the spinal canal less than 10 mm. Upper cervical spinal cord displaced posteriorly. No abnormal cord signal is present. Normal signal is present at C2 and C3. Midline structures are otherwise unremarkable. Marrow signal is otherwise normal. Sinuses/Orbits: The paranasal sinuses and mastoid air cells are clear. Lens replacements are noted. Globes and orbits are otherwise within normal limits. IMPRESSION: 1. Multiple punctate foci of acute  nonhemorrhagic infarction within the right MCA distribution as described. 2. Remote infarcts of the superior right occipital lobe. 3. No acute infarct involving the occipital cortex or optic tracts. 4. Moderate generalized atrophy and white matter disease likely reflects the sequela of chronic microvascular ischemia. 5. Prominent soft tissue pannus at the odontoid with mild mass effect on the cord but no abnormal cord signal. 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 These results were called by telephone at the time of interpretation on 09/23/2019 at 2:15 pm to provider MARK PERINI, who verbally acknowledged these results. Electronically Signed   By: San Morelle M.D.   On: 09/23/2019 14:16    Pertinent labs & imaging results that were available during my care of the patient were reviewed by me and considered in my medical decision making (see chart for details).  Medications Ordered in ED Medications - No data to display  Procedures Procedures  (including critical care time)  Medical Decision Making / ED Course I have reviewed the nursing notes for this encounter and the patient's prior records (if available in EHR or on provided paperwork).   [name] was evaluated in Emergency Department on 09/24/2019 for the symptoms described in the history of present illness. He was evaluated in the context of the global COVID-19 pandemic, which necessitated consideration that the patient might be at risk for infection with the SARS-CoV-2 virus that causes COVID-19. Institutional protocols and algorithms that pertain to the evaluation of patients at risk for COVID-19 are in a state of rapid change based on information released by regulatory bodies including the CDC and federal and state organizations. These policies and algorithms were followed  during the patient's care in the ED.  MRI in the system and confirmed strokes.  Patient does not have any deficits on my exam.  Screening labs obtained.  Neurology consulted who evaluated the patient and agreed but recommended inpatient stroke work-up.      Final Clinical Impression(s) / ED Diagnoses Final diagnoses:  Cerebral infarction due to embolism of right middle cerebral artery ([place])      This chart was dictated using voice recognition software.  Despite best efforts to proofread,  errors can occur which can change the documentation meaning.   Fatima Blank, MD 09/24/19 [phone]

## 2019-09-24 NOTE — Progress Notes (Signed)
ANTICOAGULATION CONSULT NOTE - Initial Consult  Pharmacy Consult for Apixaban  Indication: atrial fibrillation  Allergies  Allergen Reactions  . Adhesive [Tape] Rash    Zoll pads adhesive    Patient Measurements:   Heparin Dosing Weight:   Vital Signs: BP: 144/72 (12/24 1515) Pulse Rate: 80 (12/24 1515)  Labs: Recent Labs    09/23/19 1655 09/24/19 0930  HGB 14.5 15.1  HCT 43.8 44.7  PLT 263 257  APTT 27  --   LABPROT 13.4  --   INR 1.0  --   CREATININE 1.19 1.04    CrCl cannot be calculated (Unknown ideal weight.).   Medical History: Past Medical History:  Diagnosis Date  . Anemia   . Arthritis   . CAD S/P percutaneous coronary angioplasty    a. 11/2016: anterior STEMI s/p DES to LAD.   . Diabetes mellitus without complication ([place])    Diet controlled  . GERD (gastroesophageal reflux disease)   . GI bleed    10/2013: 2/2 duodenal ulcer. ASA/plavix discontinued.   . Hyperlipemia   . Hypertension   . PAF (paroxysmal atrial fibrillation) ([place])    a. noted during admission in 10/2013 for acute GI bleed. No recurrunce since. Monitor in 2014 with only PAC/PVCs  . ST elevation myocardial infarction (STEMI) involving left anterior descending (LAD) coronary artery with complication ([place]) 99991111   - 99% subtotal occlusion of LAD treated with DES stent.  . Stroke [facility])    a. ichemic CVA in 2012.     Medications:  Scheduled:  .  stroke: mapping our early stages of recovery book   Does not apply Once  . apixaban  5 mg Oral BID  . insulin aspart  0-9 Units Subcutaneous TID WC  . insulin glargine  10 Units Subcutaneous Daily  . [START ON 09/25/2019] levothyroxine  50 mcg Oral QAC breakfast  . pantoprazole  40 mg Oral Daily  . rosuvastatin  20 mg Oral q morning - 10a    Assessment: Patient is a 85 yom that is being admitted after PCP discovered the patient had a stroke found on MRI. Patient is asymptomatic at this time. Patient has a Hc of paroxysmal afib, CAD,  anemia, and recent GI bleed. Pharmacy has been asked to dose apixaban in this patient for Afif.   Goal of Therapy:   Monitor platelets by anticoagulation protocol: Yes   Plan:  - Patient is > 83 years old, Scr is consistently < 1.5, and weight is > 60 kg  - Will start the patient on Apixaban 5 mg PO BID - With recent GI bleed monitor pt for s/s of bleeding  - With stable Scr and weight pharmacy will sign off at this time and follow in peripheral   Duanne Limerick  PharmD. BCPS  09/24/2019,5:04 PM

## 2019-09-24 NOTE — ED Notes (Signed)
DaughterKennyth Lose [phone]

## 2019-09-24 NOTE — ED Notes (Signed)
Ordered lunch tray 

## 2019-09-24 NOTE — Progress Notes (Signed)
Echocardiogram 2D Echocardiogram has been performed.  Oneal Deputy Dessiree Sze 09/24/2019, 11:31 AM

## 2019-09-24 NOTE — Progress Notes (Signed)
Dr. Carlis Abbott of Vascular surgery consulted in regards to 80% stenosis on right internal carotid artery corresponding with area of infarction.  At this time patient has had 2 separate strokes on the affected side.  Questioning CEA versus long-term anticoagulation at this time.  Vascular surgery will see at some point over the weekend.

## 2019-09-25 DIAGNOSIS — E119 Type 2 diabetes mellitus without complications: Secondary | ICD-10-CM

## 2019-09-25 DIAGNOSIS — I6521 Occlusion and stenosis of right carotid artery: Secondary | ICD-10-CM

## 2019-09-25 DIAGNOSIS — Z794 Long term (current) use of insulin: Secondary | ICD-10-CM

## 2019-09-25 DIAGNOSIS — I4891 Unspecified atrial fibrillation: Secondary | ICD-10-CM

## 2019-09-25 LAB — HEMOGLOBIN A1C
Hgb A1c MFr Bld: 7.1 % — ABNORMAL HIGH (ref 4.8–5.6)
Mean Plasma Glucose: 157.07 mg/dL

## 2019-09-25 LAB — URINE CULTURE

## 2019-09-25 LAB — GLUCOSE, CAPILLARY
Glucose-Capillary: 89 mg/dL (ref 70–99)
Glucose-Capillary: 119 mg/dL — ABNORMAL HIGH (ref 70–99)
Glucose-Capillary: 177 mg/dL — ABNORMAL HIGH (ref 70–99)
Glucose-Capillary: 161 mg/dL — ABNORMAL HIGH (ref 70–99)

## 2019-09-25 LAB — CBC WITH DIFFERENTIAL/PLATELET
Abs Immature Granulocytes: 0.03 10*3/uL (ref 0.00–0.07)
Basophils Absolute: 0.1 10*3/uL (ref 0.0–0.1)
Basophils Relative: 1 %
Eosinophils Absolute: 0.3 10*3/uL (ref 0.0–0.5)
Eosinophils Relative: 3 %
HCT: 40.4 % (ref 39.0–52.0)
Hemoglobin: 13.9 g/dL (ref 13.0–17.0)
Immature Granulocytes: 0 %
Lymphocytes Relative: 31 %
Lymphs Abs: 3 10*3/uL (ref 0.7–4.0)
MCH: 33.5 pg (ref 26.0–34.0)
MCHC: 34.4 g/dL (ref 30.0–36.0)
MCV: 97.3 fL (ref 80.0–100.0)
Monocytes Absolute: 1.3 10*3/uL — ABNORMAL HIGH (ref 0.1–1.0)
Monocytes Relative: 14 %
Neutro Abs: 4.8 10*3/uL (ref 1.7–7.7)
Neutrophils Relative %: 51 %
Platelets: 248 10*3/uL (ref 150–400)
RBC: 4.15 MIL/uL — ABNORMAL LOW (ref 4.22–5.81)
RDW: 13.2 % (ref 11.5–15.5)
WBC: 9.5 10*3/uL (ref 4.0–10.5)
nRBC: 0 % (ref 0.0–0.2)

## 2019-09-25 LAB — LIPID PANEL
Cholesterol: 97 mg/dL (ref 0–200)
HDL: 30 mg/dL — ABNORMAL LOW (ref >40)
LDL Cholesterol: 38 mg/dL (ref 0–99)
Total CHOL/HDL Ratio: 3.2 RATIO
Triglycerides: 145 mg/dL (ref <150)
VLDL: 29 mg/dL (ref 0–40)

## 2019-09-25 NOTE — Consult Note (Signed)
Hospital Consult    Reason for Consult: Symptomatic right carotid stenosis Referring Physician: Neurology and hospitalist MRN #:  [id number]  History of Present Illness: This is a 85 y.o. male with history of diabetes, coronary artery disease status post previous PCI, A. fib not on anticoagulation, hypertension, hyperlipidemia, previous stroke about 10 years ago that vascular surgery has been consulted for symptomatic right carotid stenosis.  Patient states on Monday he noticed some blurry right eye vision.  Ultimately presented to his PCP who obtained an MRI.  MRI showed multiple foci of acute nonhemorrhagic infarcts within the right MCA distribution.  Patient ultimately was referred to the ED for further evaluation.  CTA neck then showed significant plaque in the right ICA origin causing at least 80% stenosis with no flow-limiting stenosis of the left ICA. Patient states he lives independently in a care facility.  States he is able to drive a car.  States he walks with a walker due to left knee pain.  States he had a stroke about 10 years ago with some left hand tingling that resolved.  Denies any previous head or neck surgery.  Was on Plavix prior to his stroke but now is on Eliquis.  Apparently anticoagulation held in past due to GIB and duodenal ulcer.  Feels he is back at his neuro baseline at this time.  Past Medical History:  Diagnosis Date  . Anemia   . Arthritis   . CAD S/P percutaneous coronary angioplasty    a. 11/2016: anterior STEMI s/p DES to LAD.   . Diabetes mellitus without complication ([place])    Diet controlled  . GERD (gastroesophageal reflux disease)   . GI bleed    10/2013: 2/2 duodenal ulcer. ASA/plavix discontinued.   . Hyperlipemia   . Hypertension   . PAF (paroxysmal atrial fibrillation) ([place])    a. noted during admission in 10/2013 for acute GI bleed. No recurrunce since. Monitor in 2014 with only PAC/PVCs  . ST elevation myocardial infarction (STEMI) involving left  anterior descending (LAD) coronary artery with complication ([place]) [id number]   - 99% subtotal occlusion of LAD treated with DES stent.  . Stroke [facility])    a. ichemic CVA in 2012.     Past Surgical History:  Procedure Laterality Date  . CORONARY STENT INTERVENTION N/A 12/11/2016   Procedure: Coronary Stent Intervention-Mid LAD;  Surgeon: Leonie Man, MD;  Location: [place] [facility];  Service: Cardiovascular:  mLAD 99%-0% Synergy DES 2.75 x 16 (3.0 mm)  . ESOPHAGOGASTRODUODENOSCOPY N/A 09/23/2013   Procedure: ESOPHAGOGASTRODUODENOSCOPY (EGD);  Surgeon: Lafayette Dragon, MD;  Location: [facility] ENDOSCOPY;  Service: Endoscopy;  Laterality: N/A;  . ESOPHAGOGASTRODUODENOSCOPY N/A 09/25/2013   Procedure: ESOPHAGOGASTRODUODENOSCOPY (EGD);  Surgeon: Beryle Beams, MD;  Location: [facility] ENDOSCOPY;  Service: Endoscopy;  Laterality: N/A;  . ESOPHAGOGASTRODUODENOSCOPY N/A 12/18/2013   Procedure: ESOPHAGOGASTRODUODENOSCOPY (EGD);  Surgeon: Beryle Beams, MD;  Location: [facility];  Service: Endoscopy;  Laterality: N/A;  . LEFT HEART CATH AND CORONARY ANGIOGRAPHY N/A 12/11/2016   Procedure: Left Heart Cath and Coronary Angiography;  Surgeon: Leonie Man, MD;  Location: [place] [facility];  Service: Cardiovascular: LM 45%. mLAD 99% (PCI), dLAD 40% & 50%, ostD1 - 50%. Ost-prox Cx 60%, OstOM1 60% (Med Rx).  Anteroapical HK - EF 50-55%. Normal LVEDP  . TOTAL HIP ARTHROPLASTY Right 04/28/2013   Procedure: RIGHT TOTAL HIP ARTHROPLASTY ANTERIOR APPROACH;  Surgeon: Mauri Pole, MD;  Location: [facility];  Service: Orthopedics;  Laterality: Right;  .  TRANSTHORACIC ECHOCARDIOGRAM  12/12/2016   Status post anterior STEMI:  Normal LV size with normal function. EF 60 deficits 5% with no regional wall motion abnormality. GR 2 DD. Mild LA dilation.    Allergies  Allergen Reactions  . Adhesive [Tape] Rash    Zoll pads adhesive    Prior to Admission medications   Medication Sig Start Date End Date Taking? Authorizing  Provider  clopidogrel (PLAVIX) 75 MG tablet Take 1 tablet (75 mg total) by mouth daily with breakfast. 09/21/19  Yes Leonie Man, MD  ergocalciferol (VITAMIN D2) 50000 UNITS capsule Take 50,000 Units by mouth once a week. Patient takes on Fridays   Yes [provider]  glimepiride (AMARYL) 2 MG tablet Take 1 tablet by mouth daily. 06/03/18  Yes [provider]  LANTUS SOLOSTAR 100 UNIT/ML Solostar Pen Inject 15 Units into the skin daily.  04/23/18  Yes [provider]  levothyroxine (SYNTHROID, LEVOTHROID) 50 MCG tablet Take 50 mcg by mouth daily before breakfast.   Yes [provider]  metoprolol tartrate (LOPRESSOR) 25 MG tablet Take 1 tablet (25 mg total) by mouth 2 (two) times daily. 04/10/19  Yes Leonie Man, MD  nitroGLYCERIN (NITROSTAT) 0.4 MG SL tablet Place 1 tablet (0.4 mg total) under the tongue every 5 (five) minutes x 3 doses as needed for chest pain. 12/13/16  Yes Eileen Stanford, PA-C  pantoprazole (PROTONIX) 40 MG tablet TAKE 1 TABLET ONCE DAILY. Patient taking differently: Take 40 mg by mouth daily.  12/10/18  Yes Leonie Man, MD  rosuvastatin (CRESTOR) 20 MG tablet Take 20 mg by mouth every morning.    Yes [provider]  Empagliflozin-linaGLIPtin (GLYXAMBI) 10-5 MG TABS Take 1 tablet by mouth daily.     [provider]    Social History   Socioeconomic History  . Marital status: Widowed    Spouse name: Not on file  . Number of children: Not on file  . Years of education: Not on file  . Highest education level: Not on file  Occupational History  . Not on file  Tobacco Use  . Smoking status: Never Smoker  . Smokeless tobacco: Never Used  Substance and Sexual Activity  . Alcohol use: Yes    Alcohol/week: 3.0 standard drinks    Types: 3 Shots of liquor per week    Comment: wine or vodka   . Drug use: No  . Sexual activity: Not on file  Other Topics Concern  . Not on file  Social History Narrative    . Not on file   Social Determinants of Health   Financial Resource Strain:   . Difficulty of Paying Living Expenses: Not on file  Food Insecurity:   . Worried About Charity fundraiser in the Last Year: Not on file  . Ran Out of Food in the Last Year: Not on file  Transportation Needs:   . Lack of Transportation (Medical): Not on file  . Lack of Transportation (Non-Medical): Not on file  Physical Activity:   . Days of Exercise per Week: Not on file  . Minutes of Exercise per Session: Not on file  Stress:   . Feeling of Stress : Not on file  Social Connections:   . Frequency of Communication with Friends and Family: Not on file  . Frequency of Social Gatherings with Friends and Family: Not on file  . Attends Religious Services: Not on file  . Active Member of Clubs or Organizations:  Not on file  . Attends Archivist Meetings: Not on file  . Marital Status: Not on file  Intimate Partner Violence:   . Fear of Current or Ex-Partner: Not on file  . Emotionally Abused: Not on file  . Physically Abused: Not on file  . Sexually Abused: Not on file     History reviewed. No pertinent family history.  ROS: [x]  Positive   [ ]  Negative   [ ]  All sytems reviewed and are negative  Cardiovascular: []  chest pain/pressure []  palpitations []  SOB lying flat []  DOE []  pain in legs while walking []  pain in legs at rest []  pain in legs at night []  non-healing ulcers []  hx of DVT []  swelling in legs  Pulmonary: []  productive cough []  asthma/wheezing []  home O2  Neurologic: []  weakness in []  arms []  legs []  numbness in []  arms []  legs []  hx of CVA []  mini stroke [] difficulty speaking or slurred speech []  temporary loss of vision in one eye []  dizziness  Hematologic: []  hx of cancer []  bleeding problems []  problems with blood clotting easily  Endocrine:   []  diabetes []  thyroid disease  GI []  vomiting blood []  blood in stool  GU: []  CKD/renal failure []   HD--[]  M/W/F or []  T/T/S []  burning with urination []  blood in urine  Psychiatric: []  anxiety []  depression  Musculoskeletal: []  arthritis []  joint pain  Integumentary: []  rashes []  ulcers  Constitutional: []  fever []  chills   Physical Examination  Vitals:   09/25/19 0335 09/25/19 0817  BP: 115/74 (!) 164/74  Pulse: 68 75  Resp: 15 16  Temp: 97.8 F (36.6 C) 97.7 F (36.5 C)  SpO2: 98% 100%   Body mass index is 24.97 kg/m.  General:  WDWN in NAD Gait: Not observed HENT: WNL, normocephalic Pulmonary: normal non-labored breathing, without Rales, rhonchi,  wheezing Cardiac: irregular, without  Murmurs, rubs or gallops Abdomen: soft, NT/ND, no masses Vascular Exam/Pulses: Extremities: without ischemic changes, without Gangrene , without cellulitis; without open wounds;  Musculoskeletal: no muscle wasting or atrophy  Neurologic: A&O X 3; Appropriate Affect ; SENSATION: normal; MOTOR FUNCTION:  moving all extremities equally. Speech is fluent/normal.  CN II-XII grossly intact.   CBC    Component Value Date/Time   WBC 9.5 09/25/2019 0244   RBC 4.15 (L) 09/25/2019 0244   HGB 13.9 09/25/2019 0244   HCT 40.4 09/25/2019 0244   PLT 248 09/25/2019 0244   MCV 97.3 09/25/2019 0244   MCH 33.5 09/25/2019 0244   MCHC 34.4 09/25/2019 0244   RDW 13.2 09/25/2019 0244   LYMPHSABS 3.0 09/25/2019 0244   MONOABS 1.3 (H) 09/25/2019 0244   EOSABS 0.3 09/25/2019 0244   BASOSABS 0.1 09/25/2019 0244    BMET    Component Value Date/Time   NA 144 09/24/2019 0930   K 4.5 09/24/2019 0930   CL 109 09/24/2019 0930   CO2 21 (L) 09/24/2019 0930   GLUCOSE 87 09/24/2019 0930   BUN 19 09/24/2019 0930   CREATININE 1.04 09/24/2019 0930   CALCIUM 9.7 09/24/2019 0930   GFRNONAA >60 09/24/2019 0930   GFRAA >60 09/24/2019 0930    COAGS: Lab Results  Component Value Date   INR 1.0 09/23/2019   INR 1.01 12/11/2016   INR 1.37 09/26/2013     Non-Invasive Vascular Imaging:    I  independently reviewed his CTA neck and agree he has approximate 80% heavily calcified right ICA proximal stenosis, very thick calcium >4 mm.  Left ICA also calcified with approximately 50% stenosis.  MRI brain multiple punctate nonhemorrhagic infarcts within the right MCA distribution   ASSESSMENT/PLAN: This is a 85 y.o. male that presents with suspected symptomatic high-grade stenosis of the right ICA.  Initially presented with blurred vision in the right eye but MRI confirmed multiple punctate nonhemorrhagic infarct within the right MCA distribution.  The right ICA has at least an 80% heavily calcified stenosis.  Certainly would normally recommend carotid intervention; however, at 85 years old we need to ensure that he is functional enough to benefit from surgery.  I do not think he is a candidate for TCAR (carotid stent) given the right ICA calcification that measures greater than 4 mm in thickness.  His only option would be either right carotid endarterectomy versus medical management.  I discussed all this in detail with the patient.  Ultimately he asked that I discuss this with his daughter and I talked to her extensively on the phone today including risk of surgery including risk of perioperative stroke (1%), bleeding requiring return to the OR, infection, risk of MI, risk of anesthesia, etc.  Ultimately she is coming to the hospital today and will discuss further options with her dad and I will follow up with the daughter.  She does state he is very functional for 85 year old.  Marty Heck, MD [facility] Office: [phone] Pager: [phone]

## 2019-09-25 NOTE — Progress Notes (Signed)
STROKE TEAM PROGRESS NOTE   INTERVAL HISTORY He is sitting up in bed comfortably.  He has met with Dr. Fortunato Curling vascular surgeon and awaiting discussion with his daughter to decide on timing of carotid surgery.  He has no complaints today.  LDL cholesterol is 38 mg percent and hemoglobin A1c was 7.1.   Vitals:   09/24/19 2311 09/25/19 0335 09/25/19 0817 09/25/19 1202  BP: (!) 173/94 115/74 (!) 164/74 (!) 149/78  Pulse: 89 68 75 78  Resp: _0 Temp: 98.1 F (36.7 C) 97.8 F (36.6 C) 97.7 F (36.5 C) 97.9 F (36.6 C)  TempSrc: Oral Oral Oral Oral  SpO2: 99% 98% 100% 98%  Weight:      Height:        CBC:  Recent Labs  Lab 09/23/19 1655 09/24/19 0930 09/25/19 0244  WBC 12.2* 11.6* 9.5  NEUTROABS 7.9*  --  4.8  HGB 14.5 15.1 13.9  HCT 43.8 44.7 40.4  MCV 99.8 98.9 97.3  PLT 263 257 299    Basic Metabolic Panel:  Recent Labs  Lab 09/23/19 1655 09/24/19 0930  NA 143 144  K 4.3 4.5  CL 111 109  CO2 21* 21*  GLUCOSE 102* 87  BUN 19 19  CREATININE 1.19 1.04  CALCIUM 9.9 9.7   Lipid Panel:     Component Value Date/Time   CHOL 97 09/25/2019 0244   TRIG 145 09/25/2019 0244   HDL 30 (L) 09/25/2019 0244   CHOLHDL 3.2 09/25/2019 0244   VLDL 29 09/25/2019 0244   LDLCALC 38 09/25/2019 0244   HgbA1c:  Lab Results  Component Value Date   HGBA1C 7.1 (H) 09/25/2019   Urine Drug Screen: No results found for: LABOPIA, COCAINSCRNUR, LABBENZ, AMPHETMU, THCU, LABBARB  Alcohol Level No results found for: ETH  IMAGING CT ANGIO HEAD W OR WO CONTRAST  Result Date: 09/24/2019 CLINICAL DATA:  Stroke, follow-up EXAM: CT ANGIOGRAPHY HEAD AND NECK TECHNIQUE: Multidetector CT imaging of the head and neck was performed using the standard protocol during bolus administration of intravenous contrast. Multiplanar CT image reconstructions and MIPs were obtained to evaluate the vascular anatomy. Carotid stenosis measurements (when applicable) are obtained utilizing NASCET  criteria, using the distal internal carotid diameter as the denominator. CONTRAST:  135m OMNIPAQUE IOHEXOL 350 MG/ML SOLN COMPARISON:  None. FINDINGS: CT HEAD FINDINGS Brain: There is no acute intracranial hemorrhage. Small acute infarcts in the right MCA territory are better seen on the prior MRI. There is no mass effect. Patchy and confluent areas of hypoattenuation in the supratentorial white matter are nonspecific but probably reflect moderate chronic microvascular ischemic changes. There are chronic right frontal, parietal, and occipital infarcts. Additional chronic left frontoparietal infarcts. Prominence of the ventricles and sulci reflects generalized parenchymal volume loss. Vascular: There is intracranial atherosclerotic calcification at the skull base. Skull: Unremarkable. Sinuses: Minor mucosal thickening. Orbits: Bilateral lens replacements. Review of the MIP images confirms the above findings CTA NECK FINDINGS Aortic arch: Great vessel origins are patent. Right carotid system: Common carotid is patent with mild atherosclerotic wall thickening. There is primarily calcified plaque at the bifurcation and along the proximal ICA. The degree of stenosis is difficult to measure exactly due to extensive calcification but is at least 80%. The remainder of the cervical ICA is patent. Caliber is slightly smaller than contralateral ICA though this is probably primarily due to absent right A1 ACA intracranially. External carotid is patent. Left carotid system: Common carotid is patent with  mild atherosclerotic wall thickening. There is primarily calcified plaque at the bifurcation and along the proximal ICA causing less than 50% stenosis. Remainder of the cervical ICA is patent. External carotid is patent. Vertebral arteries: Dominant extracranial left vertebral artery is patent. There is calcified plaque at the origin causing stenosis. There is no visible opacification of the right vertebral artery to the C4  level where there is partial reconstitution. Additional muscular branch contribution is present more distally. Skeleton: Multilevel degenerative changes of the cervical spine. There is prominent retrodental soft tissue effacing the ventral subarachnoid space as seen on prior MRI. Other neck: No neck mass or adenopathy. Upper chest: No apical lung mass. Review of the MIP images confirms the above findings CTA HEAD FINDINGS Anterior circulation: Intracranial internal carotid arteries are patent with calcified plaque but no significant stenosis. Anterior and middle cerebral arteries are patent. 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 Posterior circulation: Intracranial vertebral arteries are patent. There is mild calcified plaque on the left. Basilar artery is patent. Posterior cerebral arteries are patent. A small left posterior communicating artery is noted. Venous sinuses: As permitted by contrast timing, patent. Review of the MIP images confirms the above findings IMPRESSION: No acute intracranial hemorrhage. Small right MCA territory infarctions are better seen on recent MRI. Chronic findings are detailed above. Significant calcified plaque at the right ICA origin causing at least 80% stenosis. Less than 50% stenosis at the left ICA origin. Occluded proximal to mid right vertebral artery extracranially with reconstitution. Electronically Signed   By: Macy Mis M.D.   On: 09/24/2019 12:32   DG Chest 2 View  Result Date: 09/24/2019 CLINICAL DATA:  CVA EXAM: CHEST - 2 VIEW COMPARISON:  October 03, 2013 FINDINGS: There is no appreciable edema or consolidation. Heart is upper normal in size with pulmonary vascularity normal. No adenopathy. There is degenerative change in each shoulder and in the thoracic spine. There is thoracolumbar levoscoliosis. IMPRESSION: No edema or consolidation. Heart upper normal in size. No evident adenopathy. Electronically Signed   By: Lowella Grip III M.D.   On:  09/24/2019 09:08   CT ANGIO NECK W OR WO CONTRAST  Result Date: 09/24/2019 CLINICAL DATA:  Stroke, follow-up EXAM: CT ANGIOGRAPHY HEAD AND NECK TECHNIQUE: Multidetector CT imaging of the head and neck was performed using the standard protocol during bolus administration of intravenous contrast. Multiplanar CT image reconstructions and MIPs were obtained to evaluate the vascular anatomy. Carotid stenosis measurements (when applicable) are obtained utilizing NASCET criteria, using the distal internal carotid diameter as the denominator. CONTRAST:  194m OMNIPAQUE IOHEXOL 350 MG/ML SOLN COMPARISON:  None. FINDINGS: CT HEAD FINDINGS Brain: There is no acute intracranial hemorrhage. Small acute infarcts in the right MCA territory are better seen on the prior MRI. There is no mass effect. Patchy and confluent areas of hypoattenuation in the supratentorial white matter are nonspecific but probably reflect moderate chronic microvascular ischemic changes. There are chronic right frontal, parietal, and occipital infarcts. Additional chronic left frontoparietal infarcts. Prominence of the ventricles and sulci reflects generalized parenchymal volume loss. Vascular: There is intracranial atherosclerotic calcification at the skull base. Skull: Unremarkable. Sinuses: Minor mucosal thickening. Orbits: Bilateral lens replacements. Review of the MIP images confirms the above findings CTA NECK FINDINGS Aortic arch: Great vessel origins are patent. Right carotid system: Common carotid is patent with mild atherosclerotic wall thickening. There is primarily calcified plaque at the bifurcation and along the proximal ICA. The degree of stenosis is difficult to measure  exactly due to extensive calcification but is at least 80%. The remainder of the cervical ICA is patent. Caliber is slightly smaller than contralateral ICA though this is probably primarily due to absent right A1 ACA intracranially. External carotid is patent. Left  carotid system: Common carotid is patent with mild atherosclerotic wall thickening. There is primarily calcified plaque at the bifurcation and along the proximal ICA causing less than 50% stenosis. Remainder of the cervical ICA is patent. External carotid is patent. Vertebral arteries: Dominant extracranial left vertebral artery is patent. There is calcified plaque at the origin causing stenosis. There is no visible opacification of the right vertebral artery to the C4 level where there is partial reconstitution. Additional muscular branch contribution is present more distally. Skeleton: Multilevel degenerative changes of the cervical spine. There is prominent retrodental soft tissue effacing the ventral subarachnoid space as seen on prior MRI. Other neck: No neck mass or adenopathy. Upper chest: No apical lung mass. Review of the MIP images confirms the above findings CTA HEAD FINDINGS Anterior circulation: Intracranial internal carotid arteries are patent with calcified plaque but no significant stenosis. Anterior and middle cerebral arteries are patent. 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 Posterior circulation: Intracranial vertebral arteries are patent. There is mild calcified plaque on the left. Basilar artery is patent. Posterior cerebral arteries are patent. A small left posterior communicating artery is noted. Venous sinuses: As permitted by contrast timing, patent. Review of the MIP images confirms the above findings IMPRESSION: No acute intracranial hemorrhage. Small right MCA territory infarctions are better seen on recent MRI. Chronic findings are detailed above. Significant calcified plaque at the right ICA origin causing at least 80% stenosis. Less than 50% stenosis at the left ICA origin. Occluded proximal to mid right vertebral artery extracranially with reconstitution. Electronically Signed   By: Macy Mis M.D.   On: 09/24/2019 12:32   MR BRAIN WO CONTRAST  Result Date:  09/23/2019 CLINICAL DATA:  Visual changes going on for cervical days. EXAM: MRI HEAD WITHOUT CONTRAST TECHNIQUE: Multiplanar, multiecho pulse sequences of the brain and surrounding structures were obtained without intravenous contrast. COMPARISON:  CT head without contrast 12/17/2011. MR head without contrast 11/08/2010. FINDINGS: Brain: Diffusion-weighted images demonstrate several punctate foci of restricted diffusion within the right MCA distribution. The largest is in the superior right temporal lobe, measuring 5 mm. There is a punctate focus in the right caudate head. There is a punctate focus in a watershed distribution anteriorly on image 32 of series 4. A punctate focus is present in the right parietal lobe, alongside the atrium of the right lateral ventricle. No other focal areas of acute infarction are present. There is no acute infarct involving the visual cortex or optic tracts. Remote infarct is present in the superior right occipital lobe. There is chronic volume loss. Moderate generalized atrophy is present. Remote infarcts are present in the postcentral gyrus bilaterally, prominent right than left. The ventricles are proportionate to the degree of atrophy. No significant extraaxial fluid collection is present. White matter changes extend into the brainstem. The cerebellum is unremarkable. Vascular: Abnormal signal is present in the right vertebral artery suggesting slow or likely occluded flow. Left vertebral artery is the dominant vessel. Basilar artery is patent. Flow is present in the anterior circulation bilaterally. Skull and upper cervical spine: Prominent soft tissue pannus surrounds the odontoid. This narrows the spinal canal less than 10 mm. Upper cervical spinal cord displaced posteriorly. No abnormal cord signal is present. Normal signal  is present at C2 and C3. Midline structures are otherwise unremarkable. Marrow signal is otherwise normal. Sinuses/Orbits: The paranasal sinuses and  mastoid air cells are clear. Lens replacements are noted. Globes and orbits are otherwise within normal limits. IMPRESSION: 1. Multiple punctate foci of acute nonhemorrhagic infarction within the right MCA distribution as described. 2. Remote infarcts of the superior right occipital lobe. 3. No acute infarct involving the occipital cortex or optic tracts. 4. Moderate generalized atrophy and white matter disease likely reflects the sequela of chronic microvascular ischemia. 5. Prominent soft tissue pannus at the odontoid with mild mass effect on the cord but no abnormal cord signal. 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 These results were called by telephone at the time of interpretation on 09/23/2019 at 2:15 pm to provider MARK PERINI, who verbally acknowledged these results. Electronically Signed   By: San Morelle M.D.   On: 09/23/2019 14:16   ECHOCARDIOGRAM COMPLETE  Result Date: 09/24/2019   ECHOCARDIOGRAM REPORT   Patient Name:   [name] Date of Exam: 09/24/2019 Medical Rec #:  [id number]    Height:       69.0 in Accession #:    [id number]   Weight:       173.6 lb Date of Birth:  April 13, 1924   BSA:          1.95 m Patient Age:    36 years     BP:           151/95 mmHg Patient Gender: M            HR:           73 bpm. Exam Location:  Inpatient Procedure: 2D Echo, Color Doppler, Cardiac Doppler and Intracardiac            Opacification Agent Indications:    Stroke  History:        Patient has prior history of Echocardiogram examinations, most                 recent 12/12/2016. CAD, Arrythmias:Atrial Fibrillation; Risk                 Factors:Hypertension, Diabetes and Dyslipidemia.  Sonographer:    Raquel Sarna Senior RDCS Referring Phys: [phone] [place]  1. Left ventricular ejection fraction, by visual estimation, is 55 to 60%. The left ventricle has normal function. There is no left ventricular hypertrophy.  2. Indeterminate diastolic filling due to E-A  fusion.  3. The left ventricle has no regional wall motion abnormalities.  4. Global right ventricle has normal systolic function.The right ventricular size is normal. No increase in right ventricular wall thickness.  5. Left atrial size was normal.  6. Right atrial size was normal.  7. The mitral valve is normal in structure. No evidence of mitral valve regurgitation. No evidence of mitral stenosis.  8. The tricuspid valve is grossly normal.  9. The aortic valve is grossly normal. Aortic valve regurgitation is not visualized. No evidence of aortic valve sclerosis or stenosis. 10. The pulmonic valve was not well visualized. Pulmonic valve regurgitation is not visualized. 11. TR signal is inadequate for assessing pulmonary artery systolic pressure. 12. The inferior vena cava is normal in size with greater than 50% respiratory variability, suggesting right atrial pressure of 3 mmHg. FINDINGS  Left Ventricle: Left ventricular ejection fraction, by visual estimation, is 55 to 60%. The left ventricle has normal function. The left ventricle has no regional wall motion abnormalities.  There is no left ventricular hypertrophy. Indeterminate diastolic filling due to E-A fusion. Normal left atrial pressure. Right Ventricle: The right ventricular size is normal. No increase in right ventricular wall thickness. Global RV systolic function is has normal systolic function. Left Atrium: Left atrial size was normal in size. Right Atrium: Right atrial size was normal in size Pericardium: There is no evidence of pericardial effusion. Mitral Valve: The mitral valve is normal in structure. No evidence of mitral valve regurgitation. No evidence of mitral valve stenosis by observation. Tricuspid Valve: The tricuspid valve is grossly normal. Tricuspid valve regurgitation is not demonstrated. Aortic Valve: The aortic valve is grossly normal. Aortic valve regurgitation is not visualized. The aortic valve is structurally normal, with no  evidence of sclerosis or stenosis. Pulmonic Valve: The pulmonic valve was not well visualized. Pulmonic valve regurgitation is not visualized. Pulmonic regurgitation is not visualized. Aorta: The aortic root, ascending aorta and aortic arch are all structurally normal, with no evidence of dilitation or obstruction. Venous: The inferior vena cava is normal in size with greater than 50% respiratory variability, suggesting right atrial pressure of 3 mmHg. IAS/Shunts: No atrial level shunt detected by color flow Doppler. There is no evidence of a patent foramen ovale. No ventricular septal defect is seen or detected. There is no evidence of an atrial septal defect.  LEFT VENTRICLE PLAX 2D LVIDd:         4.30 cm LVIDs:         3.40 cm LV PW:         0.90 cm LV IVS:        1.10 cm LVOT diam:     1.90 cm LV SV:         36 ml LV SV Index:   18.10 LVOT Area:     2.84 cm  RIGHT VENTRICLE RV S prime:     14.40 cm/s TAPSE (M-mode): 2.0 cm LEFT ATRIUM             Index       RIGHT ATRIUM           Index LA diam:        3.00 cm 1.54 cm/m  RA Area:     21.00 cm LA Vol (A2C):   79.8 ml 41.02 ml/m RA Volume:   63.10 ml  32.44 ml/m LA Vol (A4C):   46.7 ml 24.01 ml/m LA Biplane Vol: 64.1 ml 32.95 ml/m  AORTIC VALVE LVOT Vmax:   63.30 cm/s LVOT Vmean:  43.700 cm/s LVOT VTI:    0.119 m  AORTA Ao Root diam: 2.40 cm  SHUNTS Systemic VTI:  0.12 m Systemic Diam: 1.90 cm  Dani Gobble Croitoru MD Electronically signed by Sanda Klein MD Signature Date/Time: 09/24/2019/1:50:21 PM    Final     PHYSICAL EXAM Pleasant elderly Caucasian male not in distress.  He has a right carotid bruit. . Afebrile. Head is nontraumatic. Neck is supple without bruit.    Cardiac exam no murmur or gallop. Lungs are clear to auscultation. Distal pulses are well felt. Neurological Exam ;  Awake  Alert oriented x 3. Normal speech and language.eye movements full without nystagmus.fundi were not visualized. Vision acuity and fields appear normal. Hearing is  normal. Palatal movements are normal. Face symmetric. Tongue midline. Normal strength, tone, reflexes and coordination. Normal sensation. Gait deferred.  ASSESSMENT/PLAN Mr. [name] is a 85 y.o. male with history of AF not on [facility], prior stroke in 2012 on plavix, CAD,  DM, HTN presenting with R eye superior vision loss, now recovered.    Stroke: incidental R MCA embolic infarcts from likely symptomatic proximal right ICA stenosis.  He also has history of known AF not on AC d/t GIB yrs ago  MRI  Multiple punctate R MCA distribution infarcts. Old superior R occipital infarct. Moderate small vessel disease. Moderate atrophy. Soft tissue pannus at odontoid likely arthritis.   CTA head & neck small right MCA territory infarcts.  Chronic infarcts.  No hemorrhage.  Right ICA 80% stenosis.  Left ICA less than 50% stenosis.  Occluded proximal to mid right extracranial VA with reconstitution.  2D Echo EF 55-60%. No source of embolus . In AF  LDL pending   HgbA1c pending   Lovenox 40 mg sq daily for VTE prophylaxis  clopidogrel 75 mg daily prior to admission, now on clopidogrel 75 mg daily. Anticoagulation with Eliquis recommended.  After elective carotid surgery if patient is no longer high risk for GI bleeding Dr. Leonie Man discussed with pt. and Dr. Tamala Julian  Therapy recommendations:  pending    Disposition:  pending  (resides at [facility] independent living)  Paroxsymal Atrial Fibrillation  Home anticoagulation:  none   Last INR 1.0 . [place] for [employer], Continue at discharge   Hypertension  Stable . Permissive hypertension (OK if < 220/120) but gradually normalize in 5-7 days . Long-term BP goal normotensive  Hyperlipidemia  Home meds:  crestor 20, resumed in hospital  LDL pending, goal < 70  Continue statin at discharge  Diabetes type II   HgbA1c pending, goal < 7.0  Other Stroke Risk Factors  Advanced age  ETOH use, advised to drink no more than 2 drink(s) a day  Hx  stroke/TIA  11/2010 R brain stroke central sulcus/R parietal. Changed aspirin to plavix. Likely embolic, no source found during admission      Coronary artery disease w/ hx MI, PCI w/ stent  Hx GIB  Hx duodenal GIB 11/2010 (duodenal ulcer) while on aspirin and plavix, meds stopped.   EGD 09/23/2013 esophagitis and multiple shallow duodenal ulcerations s/p clip while on aspirin. Transfused 3 units. Repeat EGD 12/26 (massive duodenal ulcer) NEW AF NOTED during this admission   Stable since then. Hgb 15  Start Eliquis today  Dr. Leonie Man to follow up with Dr. Tamala Julian, ? F/u EGD as [facility] day # 1     Patient presented with transient right eye altitudinal vision loss as well as silent right MCA branch infarct on MRI likely from symptomatic proximal right ICA stenosis.  He will benefit with elective right carotid revascularization but at his advanced age risk benefit has to be carefully weighed and agree with discussion with daughter and make a final decision after that.  He has remote history of A. fib and may also benefit with anticoagulation with Eliquis after carefully weighing the risk benefit given prior history of his GI bleed and advanced age.  Discussed with Dr. Fortunato Curling and answered questions.  Greater than 50% time during this 25-minute visit was spent on counseling and coordination of care and discussion with care team Antony Contras, MD Medical Director [place] Pager: [phone] 09/25/2019 12:08 PM   To contact Stroke Continuity provider, please refer to [email or web address] After hours, contact General Neurology

## 2019-09-25 NOTE — Progress Notes (Signed)
PROGRESS NOTE    [name]  [id number] DOB: Apr 30, 1924 DOA: 09/23/2019 PCP: Stuart Bowen, MD   Brief Narrative: [name] is a 85 y.o. male with medical history significant of hypertension, hyperlipidemia, paroxysmal atrial fibrillation on Plavix, CEA, CAD s/p DES to LAD, CVA, anemia, history of GI bleeding. Patient presented secondary to vision changes and found to have an acute CVA.   Assessment & Plan:   Principal Problem:   Acute right MCA stroke (HCC) Active Problems:   Essential hypertension   Controlled diabetes mellitus type II without complication (HCC)   Dyslipidemia   Hypothyroidism   PAF (paroxysmal atrial fibrillation) (HCC)   Leukocytosis   Right MCA stroke MRI significant for multiple punctate foci of acute nonhemorrhagic infarction within right MCA distribution in addition to remote infarcts of superior right occipital lobe and moderate generalized atrophy. LDL of 38. Hemoglobin A1C of 7.1%. PT/OT/SLP pending. -Neurology recommendations: Eliquis, Vascular surgery consult  Carotid artery stenosis CTA head/neck significant for right ICA 80% stenosis and <50% stenosis at left ICA origin. Vascular surgery consulted and considering right CEA in light of patient's high level of baseline function. Further discussions with patient and daughter planned. Previously on aspirin and Plavix which were discontinued secondary to GI bleeding. -Vascular surgery recommendations  Leukocytosis Mildly elevated. Resolved.  Suspected UTI Started on Ceftriaxone IV. Urine culture significant for multiple species.  Paroxysmal atrial fibrillation Patient is on metoprolol as an outpatient. Rate controlled. Sinus rhythm. -Resume metoprolol  Diabetes mellitus, type 2 Patient is on Lantus 15 units in addition to Glyxambi and glimepiride.  Hypothyroidism -Continue Synthroid  Essential hypertension Patient Is on metoprolol as an outpatient. -Will resume metoprolol for  above  Hyperlipidemia LDL of 38. Patient on Crestor.  Hyperbilirubinemia Bilirubin of 1.5. Stable.  Pressure injury Medial sacrum, POA  History of GI bleeding Remote history. Watch for bleeding while on Eliquis   DVT prophylaxis: Eliquis Code Status:   Code Status: Full Code Family Communication: None Disposition Plan: Discharge likely in 24 hours unless procedure performed during this admission   Consultants:   Neurology  Vascular surgery  Procedures:   12/24: Transthoracic Echocardiogram IMPRESSIONS    1. Left ventricular ejection fraction, by visual estimation, is 55 to 60%. The left ventricle has normal function. There is no left ventricular hypertrophy.  2. Indeterminate diastolic filling due to E-A fusion.  3. The left ventricle has no regional wall motion abnormalities.  4. Global right ventricle has normal systolic function.The right ventricular size is normal. No increase in right ventricular wall thickness.  5. Left atrial size was normal.  6. Right atrial size was normal.  7. The mitral valve is normal in structure. No evidence of mitral valve regurgitation. No evidence of mitral stenosis.  8. The tricuspid valve is grossly normal.  9. The aortic valve is grossly normal. Aortic valve regurgitation is not visualized. No evidence of aortic valve sclerosis or stenosis. 10. The pulmonic valve was not well visualized. Pulmonic valve regurgitation is not visualized. 11. TR signal is inadequate for assessing pulmonary artery systolic pressure. 12. The inferior vena cava is normal in size with greater than 50% respiratory variability, suggesting right atrial pressure of 3 mmHg.   Antimicrobials:  None    Subjective: No concerns today. Vision is improved.  Objective: Vitals:   09/24/19 2311 09/25/19 0335 09/25/19 0817 09/25/19 1202  BP: (!) 173/94 115/74 (!) 164/74 (!) 149/78  Pulse: 89 68 75 78  Resp: 18 15 16  16  Temp: 98.1 F (36.7 C) 97.8 F (36.6  C) 97.7 F (36.5 C) 97.9 F (36.6 C)  TempSrc: Oral Oral Oral Oral  SpO2: 99% 98% 100% 98%  Weight:      Height:        Intake/Output Summary (Last 24 hours) at 09/25/2019 1435 Last data filed at 09/25/2019 0818 Gross per 24 hour  Intake 403.33 ml  Output 1650 ml  Net -1246.67 ml   Filed Weights   09/24/19 2217  Weight: 76.7 kg    Examination:  General exam: Appears calm and comfortable Respiratory system: Clear to auscultation. Respiratory effort normal. Cardiovascular system: S1 & S2 heard, RRR. No murmurs, rubs, gallops or clicks. Gastrointestinal system: Abdomen is nondistended, soft and nontender. No organomegaly or masses felt. Normal bowel sounds heard. Central nervous system: Alert and oriented. Extremities: No edema. No calf tenderness Skin: No cyanosis. No rashes Psychiatry: Judgement and insight appear normal. Mood & affect appropriate.     Data Reviewed: I have personally reviewed following labs and imaging studies  CBC: Recent Labs  Lab 09/23/19 1655 09/24/19 0930 09/25/19 0244  WBC 12.2* 11.6* 9.5  NEUTROABS 7.9*  --  4.8  HGB 14.5 15.1 13.9  HCT 43.8 44.7 40.4  MCV 99.8 98.9 97.3  PLT 263 257 [id number]   Basic Metabolic Panel: Recent Labs  Lab 09/23/19 1655 09/24/19 0930  NA 143 144  K 4.3 4.5  CL 111 109  CO2 21* 21*  GLUCOSE 102* 87  BUN 19 19  CREATININE 1.19 1.04  CALCIUM 9.9 9.7   GFR: Estimated Creatinine Clearance: 44.4 mL/min (by C-G formula based on SCr of 1.04 mg/dL). Liver Function Tests: Recent Labs  Lab 09/23/19 1655 09/24/19 0930  AST 29 27  ALT 20 20  ALKPHOS 45 41  BILITOT 1.5* 1.5*  PROT 6.5 6.7  ALBUMIN 3.8 3.9   No results for input(s): LIPASE, AMYLASE in the last 168 hours. No results for input(s): AMMONIA in the last 168 hours. Coagulation Profile: Recent Labs  Lab 09/23/19 1655  INR 1.0   Cardiac Enzymes: No results for input(s): CKTOTAL, CKMB, CKMBINDEX, TROPONINI in the last 168 hours. BNP (last 3  results) No results for input(s): PROBNP in the last 8760 hours. HbA1C: Recent Labs    09/25/19 0244  HGBA1C 7.1*   CBG: Recent Labs  Lab 09/24/19 1526 09/24/19 1741 09/24/19 2152 09/25/19 0615 09/25/19 1203  GLUCAP 155* 124* 119* 89 119*   Lipid Profile: Recent Labs    09/25/19 0244  CHOL 97  HDL 30*  LDLCALC 38  TRIG 145  CHOLHDL 3.2   Thyroid Function Tests: Recent Labs    09/24/19 0933  TSH 1.451   Anemia Panel: No results for input(s): VITAMINB12, FOLATE, FERRITIN, TIBC, IRON, RETICCTPCT in the last 72 hours. Sepsis Labs: No results for input(s): PROCALCITON, LATICACIDVEN in the last 168 hours.  Recent Results (from the past 240 hour(s))  SARS CORONAVIRUS 2 (TAT 6-24 HRS) Nasopharyngeal Nasopharyngeal Swab     Status: None   Collection Time: 09/24/19  7:34 AM   Specimen: Nasopharyngeal Swab  Result Value Ref Range Status   SARS Coronavirus 2 NEGATIVE NEGATIVE Final    Comment: (NOTE) SARS-CoV-2 target nucleic acids are NOT DETECTED. The SARS-CoV-2 RNA is generally detectable in upper and lower respiratory specimens during the acute phase of infection. Negative results do not preclude SARS-CoV-2 infection, do not rule out co-infections with other pathogens, and should not be used as the sole basis for treatment  or other patient management decisions. Negative results must be combined with clinical observations, patient history, and epidemiological information. The expected result is Negative. Fact Sheet for Patients: SugarRoll.be Fact Sheet for Healthcare Providers: https://www.woods-mathews.com/ This test is not yet approved or cleared by the [place] FDA and  has been authorized for detection and/or diagnosis of SARS-CoV-2 by FDA under an Emergency Use Authorization (EUA). This EUA will remain  in effect (meaning this test can be used) for the duration of the COVID-19 declaration under Section 56 4(b)(1)  of the Act, 21 U.S.C. section 360bbb-3(b)(1), unless the authorization is terminated or revoked sooner. Performed at [facility], [place] [address], [place], [place] [address]   Urine culture     Status: Abnormal   Collection Time: 09/24/19 12:22 PM   Specimen: Urine, Random  Result Value Ref Range Status   Specimen Description URINE, RANDOM  Final   Special Requests   Final    NONE Performed at [facility], [place] [address] [place], [place] [address]    Culture MULTIPLE SPECIES PRESENT, SUGGEST RECOLLECTION (A)  Final   Report Status 09/25/2019 FINAL  Final         Radiology Studies: CT ANGIO HEAD W OR WO CONTRAST  Result Date: 09/24/2019 CLINICAL DATA:  Stroke, follow-up EXAM: CT ANGIOGRAPHY HEAD AND NECK TECHNIQUE: Multidetector CT imaging of the head and neck was performed using the standard protocol during bolus administration of intravenous contrast. Multiplanar CT image reconstructions and MIPs were obtained to evaluate the vascular anatomy. Carotid stenosis measurements (when applicable) are obtained utilizing NASCET criteria, using the distal internal carotid diameter as the denominator. CONTRAST:  135mL OMNIPAQUE IOHEXOL 350 MG/ML SOLN COMPARISON:  None. FINDINGS: CT HEAD FINDINGS Brain: There is no acute intracranial hemorrhage. Small acute infarcts in the right MCA territory are better seen on the prior MRI. There is no mass effect. Patchy and confluent areas of hypoattenuation in the supratentorial white matter are nonspecific but probably reflect moderate chronic microvascular ischemic changes. There are chronic right frontal, parietal, and occipital infarcts. Additional chronic left frontoparietal infarcts. Prominence of the ventricles and sulci reflects generalized parenchymal volume loss. Vascular: There is intracranial atherosclerotic calcification at the skull base. Skull: Unremarkable. Sinuses: Minor mucosal thickening. Orbits: Bilateral lens replacements.  Review of the MIP images confirms the above findings CTA NECK FINDINGS Aortic arch: Great vessel origins are patent. Right carotid system: Common carotid is patent with mild atherosclerotic wall thickening. There is primarily calcified plaque at the bifurcation and along the proximal ICA. The degree of stenosis is difficult to measure exactly due to extensive calcification but is at least 80%. The remainder of the cervical ICA is patent. Caliber is slightly smaller than contralateral ICA though this is probably primarily due to absent right A1 ACA intracranially. External carotid is patent. Left carotid system: Common carotid is patent with mild atherosclerotic wall thickening. There is primarily calcified plaque at the bifurcation and along the proximal ICA causing less than 50% stenosis. Remainder of the cervical ICA is patent. External carotid is patent. Vertebral arteries: Dominant extracranial left vertebral artery is patent. There is calcified plaque at the origin causing stenosis. There is no visible opacification of the right vertebral artery to the C4 level where there is partial reconstitution. Additional muscular branch contribution is present more distally. Skeleton: Multilevel degenerative changes of the cervical spine. There is prominent retrodental soft tissue effacing the ventral subarachnoid space as seen on prior MRI. Other neck: No neck  mass or adenopathy. Upper chest: No apical lung mass. Review of the MIP images confirms the above findings CTA HEAD FINDINGS Anterior circulation: Intracranial internal carotid arteries are patent with calcified plaque but no significant stenosis. Anterior and middle cerebral arteries are patent. 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 Posterior circulation: Intracranial vertebral arteries are patent. There is mild calcified plaque on the left. Basilar artery is patent. Posterior cerebral arteries are patent. A small left posterior communicating artery is  noted. Venous sinuses: As permitted by contrast timing, patent. Review of the MIP images confirms the above findings IMPRESSION: No acute intracranial hemorrhage. Small right MCA territory infarctions are better seen on recent MRI. Chronic findings are detailed above. Significant calcified plaque at the right ICA origin causing at least 80% stenosis. Less than 50% stenosis at the left ICA origin. Occluded proximal to mid right vertebral artery extracranially with reconstitution. Electronically Signed   By: Macy Mis M.D.   On: 09/24/2019 12:32   DG Chest 2 View  Result Date: 09/24/2019 CLINICAL DATA:  CVA EXAM: CHEST - 2 VIEW COMPARISON:  October 03, 2013 FINDINGS: There is no appreciable edema or consolidation. Heart is upper normal in size with pulmonary vascularity normal. No adenopathy. There is degenerative change in each shoulder and in the thoracic spine. There is thoracolumbar levoscoliosis. IMPRESSION: No edema or consolidation. Heart upper normal in size. No evident adenopathy. Electronically Signed   By: Lowella Grip III M.D.   On: 09/24/2019 09:08   CT ANGIO NECK W OR WO CONTRAST  Result Date: 09/24/2019 CLINICAL DATA:  Stroke, follow-up EXAM: CT ANGIOGRAPHY HEAD AND NECK TECHNIQUE: Multidetector CT imaging of the head and neck was performed using the standard protocol during bolus administration of intravenous contrast. Multiplanar CT image reconstructions and MIPs were obtained to evaluate the vascular anatomy. Carotid stenosis measurements (when applicable) are obtained utilizing NASCET criteria, using the distal internal carotid diameter as the denominator. CONTRAST:  124mL OMNIPAQUE IOHEXOL 350 MG/ML SOLN COMPARISON:  None. FINDINGS: CT HEAD FINDINGS Brain: There is no acute intracranial hemorrhage. Small acute infarcts in the right MCA territory are better seen on the prior MRI. There is no mass effect. Patchy and confluent areas of hypoattenuation in the supratentorial white  matter are nonspecific but probably reflect moderate chronic microvascular ischemic changes. There are chronic right frontal, parietal, and occipital infarcts. Additional chronic left frontoparietal infarcts. Prominence of the ventricles and sulci reflects generalized parenchymal volume loss. Vascular: There is intracranial atherosclerotic calcification at the skull base. Skull: Unremarkable. Sinuses: Minor mucosal thickening. Orbits: Bilateral lens replacements. Review of the MIP images confirms the above findings CTA NECK FINDINGS Aortic arch: Great vessel origins are patent. Right carotid system: Common carotid is patent with mild atherosclerotic wall thickening. There is primarily calcified plaque at the bifurcation and along the proximal ICA. The degree of stenosis is difficult to measure exactly due to extensive calcification but is at least 80%. The remainder of the cervical ICA is patent. Caliber is slightly smaller than contralateral ICA though this is probably primarily due to absent right A1 ACA intracranially. External carotid is patent. Left carotid system: Common carotid is patent with mild atherosclerotic wall thickening. There is primarily calcified plaque at the bifurcation and along the proximal ICA causing less than 50% stenosis. Remainder of the cervical ICA is patent. External carotid is patent. Vertebral arteries: Dominant extracranial left vertebral artery is patent. There is calcified plaque at the origin causing stenosis. There is no visible opacification of  the right vertebral artery to the C4 level where there is partial reconstitution. Additional muscular branch contribution is present more distally. Skeleton: Multilevel degenerative changes of the cervical spine. There is prominent retrodental soft tissue effacing the ventral subarachnoid space as seen on prior MRI. Other neck: No neck mass or adenopathy. Upper chest: No apical lung mass. Review of the MIP images confirms the above  findings CTA HEAD FINDINGS Anterior circulation: Intracranial internal carotid arteries are patent with calcified plaque but no significant stenosis. Anterior and middle cerebral arteries are patent. 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 Posterior circulation: Intracranial vertebral arteries are patent. There is mild calcified plaque on the left. Basilar artery is patent. Posterior cerebral arteries are patent. A small left posterior communicating artery is noted. Venous sinuses: As permitted by contrast timing, patent. Review of the MIP images confirms the above findings IMPRESSION: No acute intracranial hemorrhage. Small right MCA territory infarctions are better seen on recent MRI. Chronic findings are detailed above. Significant calcified plaque at the right ICA origin causing at least 80% stenosis. Less than 50% stenosis at the left ICA origin. Occluded proximal to mid right vertebral artery extracranially with reconstitution. Electronically Signed   By: Macy Mis M.D.   On: 09/24/2019 12:32   ECHOCARDIOGRAM COMPLETE  Result Date: 09/24/2019   ECHOCARDIOGRAM REPORT   Patient Name:   [name] Date of Exam: 09/24/2019 Medical Rec #:  [id number]    Height:       69.0 in Accession #:    [id number]   Weight:       173.6 lb Date of Birth:  [birth year]   BSA:          1.95 m Patient Age:    58 years     BP:           151/95 mmHg Patient Gender: M            HR:           73 bpm. Exam Location:  Inpatient Procedure: 2D Echo, Color Doppler, Cardiac Doppler and Intracardiac            Opacification Agent Indications:    Stroke  History:        Patient has prior history of Echocardiogram examinations, most                 recent 12/12/2016. CAD, Arrythmias:Atrial Fibrillation; Risk                 Factors:Hypertension, Diabetes and Dyslipidemia.  Sonographer:    Raquel Sarna Senior RDCS Referring Phys: [phone] [place]  1. Left ventricular ejection fraction, by visual estimation, is 55 to  60%. The left ventricle has normal function. There is no left ventricular hypertrophy.  2. Indeterminate diastolic filling due to E-A fusion.  3. The left ventricle has no regional wall motion abnormalities.  4. Global right ventricle has normal systolic function.The right ventricular size is normal. No increase in right ventricular wall thickness.  5. Left atrial size was normal.  6. Right atrial size was normal.  7. The mitral valve is normal in structure. No evidence of mitral valve regurgitation. No evidence of mitral stenosis.  8. The tricuspid valve is grossly normal.  9. The aortic valve is grossly normal. Aortic valve regurgitation is not visualized. No evidence of aortic valve sclerosis or stenosis. 10. The pulmonic valve was not well visualized. Pulmonic valve regurgitation is not visualized. 11. TR signal is  inadequate for assessing pulmonary artery systolic pressure. 12. The inferior vena cava is normal in size with greater than 50% respiratory variability, suggesting right atrial pressure of 3 mmHg. FINDINGS  Left Ventricle: Left ventricular ejection fraction, by visual estimation, is 55 to 60%. The left ventricle has normal function. The left ventricle has no regional wall motion abnormalities. There is no left ventricular hypertrophy. Indeterminate diastolic filling due to E-A fusion. Normal left atrial pressure. Right Ventricle: The right ventricular size is normal. No increase in right ventricular wall thickness. Global RV systolic function is has normal systolic function. Left Atrium: Left atrial size was normal in size. Right Atrium: Right atrial size was normal in size Pericardium: There is no evidence of pericardial effusion. Mitral Valve: The mitral valve is normal in structure. No evidence of mitral valve regurgitation. No evidence of mitral valve stenosis by observation. Tricuspid Valve: The tricuspid valve is grossly normal. Tricuspid valve regurgitation is not demonstrated. Aortic Valve:  The aortic valve is grossly normal. Aortic valve regurgitation is not visualized. The aortic valve is structurally normal, with no evidence of sclerosis or stenosis. Pulmonic Valve: The pulmonic valve was not well visualized. Pulmonic valve regurgitation is not visualized. Pulmonic regurgitation is not visualized. Aorta: The aortic root, ascending aorta and aortic arch are all structurally normal, with no evidence of dilitation or obstruction. Venous: The inferior vena cava is normal in size with greater than 50% respiratory variability, suggesting right atrial pressure of 3 mmHg. IAS/Shunts: No atrial level shunt detected by color flow Doppler. There is no evidence of a patent foramen ovale. No ventricular septal defect is seen or detected. There is no evidence of an atrial septal defect.  LEFT VENTRICLE PLAX 2D LVIDd:         4.30 cm LVIDs:         3.40 cm LV PW:         0.90 cm LV IVS:        1.10 cm LVOT diam:     1.90 cm LV SV:         36 ml LV SV Index:   18.10 LVOT Area:     2.84 cm  RIGHT VENTRICLE RV S prime:     14.40 cm/s TAPSE (M-mode): 2.0 cm LEFT ATRIUM             Index       RIGHT ATRIUM           Index LA diam:        3.00 cm 1.54 cm/m  RA Area:     21.00 cm LA Vol (A2C):   79.8 ml 41.02 ml/m RA Volume:   63.10 ml  32.44 ml/m LA Vol (A4C):   46.7 ml 24.01 ml/m LA Biplane Vol: 64.1 ml 32.95 ml/m  AORTIC VALVE LVOT Vmax:   63.30 cm/s LVOT Vmean:  43.700 cm/s LVOT VTI:    0.119 m  AORTA Ao Root diam: 2.40 cm  SHUNTS Systemic VTI:  0.12 m Systemic Diam: 1.90 cm  Mihai Croitoru MD Electronically signed by Sanda Klein MD Signature Date/Time: 09/24/2019/1:50:21 PM    Final         Scheduled Meds:  apixaban  5 mg Oral BID   insulin aspart  0-9 Units Subcutaneous TID WC   insulin glargine  10 Units Subcutaneous Daily   levothyroxine  50 mcg Oral QAC breakfast   pantoprazole  40 mg Oral Daily   rosuvastatin  20 mg Oral q morning - 10a  Continuous Infusions:  cefTRIAXone  (ROCEPHIN)  IV 1 g (09/25/19 1418)     LOS: 1 day     Cordelia Poche, MD [facility] 09/25/2019, 2:35 PM  If 7PM-7AM, please contact night-coverage [email or web address]

## 2019-09-26 LAB — GLUCOSE, CAPILLARY
Glucose-Capillary: 104 mg/dL — ABNORMAL HIGH (ref 70–99)
Glucose-Capillary: 144 mg/dL — ABNORMAL HIGH (ref 70–99)

## 2019-09-26 MED ORDER — APIXABAN 5 MG PO TABS: 5.0000 mg | ORAL_TABLET | Freq: Two times a day (BID) | ORAL | 0 refills | Status: DC

## 2019-09-26 NOTE — TOC Transition Note (Signed)
Transition of Care [facility]) - CM/SW Discharge Note   Patient Details  Name: [name] MRN: [id number] Date of Birth: 27-Feb-1924  [facility]) CM/SW Contact:  Gelene Mink, [place] Phone Number: 09/26/2019, 11:01 AM   Clinical Narrative:     Patient discharging back to [facility]. Patient will receive home health physical therapy through [facility].   CSW called and confirmed with the patient's daughter. The patient does not require any additional equipment. She has his walker and will be providing transportation home.     Final next level of care: Home w Home Health Services Barriers to Discharge: No Barriers Identified   Patient Goals and CMS Choice Patient states their goals for this hospitalization and ongoing recovery are:: Pt daughter wants him to discharge back to his independent living apartment CMS Medicare.gov Compare Post Acute Care list provided to:: Patient Represenative (must comment) Choice offered to / list presented to : Adult Children  Discharge Placement              Patient chooses bed at: [name] Patient to be transferred to facility by: Family Name of family member notified: [name] Patient and family notified of of transfer: 09/26/19  Discharge Plan and Services                DME Arranged: N/A DME Agency: NA       HH Arranged: PT [place] Agency: [place] Date [facility] Agency Contacted: 09/26/19 Time HH Agency Contacted: 1100 Representative spoke with at [place]: [place] ([place]) Interventions     Readmission Risk Interventions No flowsheet data found.

## 2019-09-26 NOTE — Progress Notes (Signed)
Telemetry called this morning to make team aware patient is having frequent PVC's. Upon assessment patient is asymptomatic and vitals stable. MD notified and aware, no new orders, will continue to monitor.

## 2019-09-26 NOTE — Progress Notes (Signed)
AVS reviewed with patient and patient given a copy to take home. Patient dressed, all lines removed, and taken via wheelchair to daughters car for discharge.

## 2019-09-26 NOTE — Progress Notes (Signed)
Physical Therapy Treatment Patient Details Name: [name] MRN: [id number] DOB: 04/24/1924 Today's Date: 09/26/2019    History of Present Illness [name] is an 85 y.o. male with a history of atrial fibrillation and prior stroke in 2012, on Plavix. Sent in by PCP due to stroke on MRI that patient had done on Wednesday. His symptoms consisted of right eye blurriness that is now asymptomatic.    PT Comments    Making good progress with mobility. Uses RW at baseline and pt feels his gait is at baseline. He is anticipating discharge back to [place] today.      Follow Up Recommendations  No PT follow up     Equipment Recommendations  None recommended by PT    Recommendations for Other Services       Precautions / Restrictions Precautions Precautions: Fall Restrictions Weight Bearing Restrictions: No    Mobility  Bed Mobility Overal bed mobility: (NT, pt sitting up in recliner)                Transfers Overall transfer level: Modified independent Equipment used: Rolling walker (2 wheeled)                Ambulation/Gait Ambulation/Gait assistance: Modified independent (Device/Increase time) Gait Distance (Feet): 250 Feet Assistive device: Rolling walker (2 wheeled) Gait Pattern/deviations: Step-through pattern;Decreased stride length;Wide base of support     General Gait Details: some evidence of instability walking with RW but pt reports feels at baseline, has limited ankle AROM normally with intermittent swelling of L lower leg.   Stairs             Wheelchair Mobility    Modified Rankin (Stroke Patients Only) Modified Rankin (Stroke Patients Only) Pre-Morbid Rankin Score: Slight disability Modified Rankin: Slight disability     Balance Overall balance assessment: Mild deficits observed, not formally tested                                          Cognition Arousal/Alertness: Awake/alert Behavior During  Therapy: WFL for tasks assessed/performed Overall Cognitive Status: Within Functional Limits for tasks assessed                                        Exercises      General Comments General comments (skin integrity, edema, etc.): Pt feels he is at his baseline level of function with mobility.       Pertinent Vitals/Pain Pain Assessment: No/denies pain    Home Living                      Prior Function            PT Goals (current goals can now be found in the care plan section) Acute Rehab PT Goals Patient Stated Goal: to go back to [facility] Progress towards PT goals: Progressing toward goals    Frequency    Min 3X/week      PT Plan Current plan remains appropriate    Co-evaluation              AM-PAC PT "6 Clicks" Mobility   Outcome Measure  Help needed turning from your back to your side while in a flat bed without using bedrails?: None Help needed moving from lying  on your back to sitting on the side of a flat bed without using bedrails?: None Help needed moving to and from a bed to a chair (including a wheelchair)?: None Help needed standing up from a chair using your arms (e.g., wheelchair or bedside chair)?: None Help needed to walk in hospital room?: None Help needed climbing 3-5 steps with a railing? : A Little 6 Click Score: 23    End of Session Equipment Utilized During Treatment: Gait belt Activity Tolerance: Patient tolerated treatment well Patient left: in chair;with call bell/phone within reach Nurse Communication: Mobility status PT Visit Diagnosis: Other abnormalities of gait and mobility (R26.89)     Time: [id number] PT Time Calculation (min) (ACUTE ONLY): 16 min  Charges:  $Gait Training: 8-22 mins                     Lavonia Dana, [place]  Pager [phone] Office [phone] 09/26/2019    Melvern Banker 09/26/2019, 10:48 AM

## 2019-09-26 NOTE — Discharge Summary (Signed)
Physician Discharge Summary  [name] [id number] DOB: Apr 22, 1924 DOA: 09/23/2019  PCP: Reynold Bowen, MD  Admit date: 09/23/2019 Discharge date: 09/26/2019  Admitted From: ILF Disposition: ILF  Recommendations for Outpatient Follow-up:  1. Follow up with PCP in 1 week 2. Follow up with neurology and vascular surgery 3. Please obtain BMP/CBC in one week 4. Please follow up on the following pending results: None  Home Health: None Equipment/Devices: None  Discharge Condition: Stable CODE STATUS: Full code Diet recommendation: Heart healthy   Brief/Interim Summary:  Admission HPI written by Norval Morton, MD   HPI: [name] is a 85 y.o. male with medical history significant of hypertension, hyperlipidemia, paroxysmal atrial fibrillation on Plavix, CEA, CAD s/p DES to LAD, CVA, anemia, history of GI bleeding.  2-3 days ago, he reported having vision changes in his right eye that resolved the following day.  He went and followed up with his primary care provider who ordered MRI which showed numerous punctate right MCA distribution infarcts.  At this time he has no complaints and reports that his vision is back to normal.  At baseline he ambulates with use of a walker and is right-handed.  He is followed by [facility] neurology already and was noted to have some carotid stenosis that they were following him for. Records note patient had previous GI bleed related with aspirin and Plavix for which for which he has been on Plavix alone.  ED Course: On admission into the emergency department patient was found to be afebrile, blood pressures 147/78-192/89, and all other vital signs maintained.  Labs from 12/23 significant for WBC 12.2 and total bilirubin 1.5.  MRI revealed acute right MCA distribution infarcts.  Neurology was formally consulted and recommended admission for further work-up.  TRH called to admit.    Hospital course:  Right MCA stroke MRI significant for  multiple punctate foci of acute nonhemorrhagic infarction within right MCA distribution in addition to remote infarcts of superior right occipital lobe and moderate generalized atrophy. LDL of 38. Hemoglobin A1C of 7.1%. PT recommending no follow-up. Neurology recommending Eliquis. Vascular surgery recommendations as mentioned below.  Carotid artery stenosis CTA head/neck significant for right ICA 80% stenosis and <50% stenosis at left ICA origin. Vascular surgery consulted and considering right CEA in light of patient's high level of baseline function for which the patient and his daughter are in agreement. Patient to follow-up with vascular surgery as an outpatient for right endarterectomy.  Leukocytosis Mildly elevated. Resolved.  Suspected UTI Started on Ceftriaxone IV. Urine culture significant for multiple species. No treatment on discharge. Asymptomatic.  Paroxysmal atrial fibrillation Patient is on metoprolol as an outpatient. Rate controlled. Sinus rhythm. Continue metoprolol.  Diabetes mellitus, type 2 Patient is on Lantus 15 units in addition to Glyxambi and glimepiride. Discontinued glimepiride on discharge.  Hypothyroidism Continue Synthroid  Essential hypertension Patient Is on metoprolol as an outpatient. Continue.  Hyperlipidemia LDL of 38. Patient on Crestor.  Hyperbilirubinemia Bilirubin of 1.5. Stable.  Pressure injury Medial sacrum, POA  History of GI bleeding Remote history. Watch for bleeding while on Eliquis  Discharge Diagnoses:  Principal Problem:   Acute right MCA stroke [facility]) Active Problems:   Essential hypertension   Controlled diabetes mellitus type II without complication (HCC)   Dyslipidemia   Hypothyroidism   PAF (paroxysmal atrial fibrillation) (HCC)   Leukocytosis    Discharge Instructions  Discharge Instructions    Diet - low sodium heart healthy   Complete  by: As directed    Increase activity slowly   Complete by: As  directed      Allergies as of 09/26/2019      Reactions   Adhesive [tape] Rash   Zoll pads adhesive      Medication List    STOP taking these medications   clopidogrel 75 MG tablet Commonly known as: PLAVIX   glimepiride 2 MG tablet Commonly known as: AMARYL     TAKE these medications   apixaban 5 MG Tabs tablet Commonly known as: ELIQUIS Take 1 tablet (5 mg total) by mouth 2 (two) times daily.   ergocalciferol 1.25 MG (50000 UT) capsule Commonly known as: VITAMIN D2 Take 50,000 Units by mouth once a week. Patient takes on Fridays   Glyxambi 10-5 MG Tabs Generic drug: Empagliflozin-linaGLIPtin Take 1 tablet by mouth daily.   Lantus SoloStar 100 UNIT/ML Solostar Pen Generic drug: Insulin Glargine Inject 15 Units into the skin daily.   levothyroxine 50 MCG tablet Commonly known as: SYNTHROID Take 50 mcg by mouth daily before breakfast.   metoprolol tartrate 25 MG tablet Commonly known as: LOPRESSOR Take 1 tablet (25 mg total) by mouth 2 (two) times daily.   nitroGLYCERIN 0.4 MG SL tablet Commonly known as: NITROSTAT Place 1 tablet (0.4 mg total) under the tongue every 5 (five) minutes x 3 doses as needed for chest pain.   pantoprazole 40 MG tablet Commonly known as: PROTONIX TAKE 1 TABLET ONCE DAILY.   rosuvastatin 20 MG tablet Commonly known as: CRESTOR Take 20 mg by mouth every morning.      Follow-up Information    Reynold Bowen, MD. Schedule an appointment as soon as possible for a visit in 1 week(s).   Specialty: Endocrinology Why: Hospital follow-up Contact information: [place] [address] [phone]        Marty Heck, MD. Schedule an appointment as soon as possible for a visit.   Specialty: Vascular Surgery Why: Carotid stenosis Contact information: [address] [place] [address] [phone]          Allergies  Allergen Reactions  . Adhesive [Tape] Rash    Zoll pads adhesive     Consultations:  Neurology  Vascular surgery   Procedures/Studies: CT ANGIO HEAD W OR WO CONTRAST  Result Date: 09/24/2019 CLINICAL DATA:  Stroke, follow-up EXAM: CT ANGIOGRAPHY HEAD AND NECK TECHNIQUE: Multidetector CT imaging of the head and neck was performed using the standard protocol during bolus administration of intravenous contrast. Multiplanar CT image reconstructions and MIPs were obtained to evaluate the vascular anatomy. Carotid stenosis measurements (when applicable) are obtained utilizing NASCET criteria, using the distal internal carotid diameter as the denominator. CONTRAST:  187mL OMNIPAQUE IOHEXOL 350 MG/ML SOLN COMPARISON:  None. FINDINGS: CT HEAD FINDINGS Brain: There is no acute intracranial hemorrhage. Small acute infarcts in the right MCA territory are better seen on the prior MRI. There is no mass effect. Patchy and confluent areas of hypoattenuation in the supratentorial white matter are nonspecific but probably reflect moderate chronic microvascular ischemic changes. There are chronic right frontal, parietal, and occipital infarcts. Additional chronic left frontoparietal infarcts. Prominence of the ventricles and sulci reflects generalized parenchymal volume loss. Vascular: There is intracranial atherosclerotic calcification at the skull base. Skull: Unremarkable. Sinuses: Minor mucosal thickening. Orbits: Bilateral lens replacements. Review of the MIP images confirms the above findings CTA NECK FINDINGS Aortic arch: Great vessel origins are patent. Right carotid system: Common carotid is patent with mild atherosclerotic wall thickening.  There is primarily calcified plaque at the bifurcation and along the proximal ICA. The degree of stenosis is difficult to measure exactly due to extensive calcification but is at least 80%. The remainder of the cervical ICA is patent. Caliber is slightly smaller than contralateral ICA though this is probably primarily due to absent right A1  ACA intracranially. External carotid is patent. Left carotid system: Common carotid is patent with mild atherosclerotic wall thickening. There is primarily calcified plaque at the bifurcation and along the proximal ICA causing less than 50% stenosis. Remainder of the cervical ICA is patent. External carotid is patent. Vertebral arteries: Dominant extracranial left vertebral artery is patent. There is calcified plaque at the origin causing stenosis. There is no visible opacification of the right vertebral artery to the C4 level where there is partial reconstitution. Additional muscular branch contribution is present more distally. Skeleton: Multilevel degenerative changes of the cervical spine. There is prominent retrodental soft tissue effacing the ventral subarachnoid space as seen on prior MRI. Other neck: No neck mass or adenopathy. Upper chest: No apical lung mass. Review of the MIP images confirms the above findings CTA HEAD FINDINGS Anterior circulation: Intracranial internal carotid arteries are patent with calcified plaque but no significant stenosis. Anterior and middle cerebral arteries are patent. 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 Posterior circulation: Intracranial vertebral arteries are patent. There is mild calcified plaque on the left. Basilar artery is patent. Posterior cerebral arteries are patent. A small left posterior communicating artery is noted. Venous sinuses: As permitted by contrast timing, patent. Review of the MIP images confirms the above findings IMPRESSION: No acute intracranial hemorrhage. Small right MCA territory infarctions are better seen on recent MRI. Chronic findings are detailed above. Significant calcified plaque at the right ICA origin causing at least 80% stenosis. Less than 50% stenosis at the left ICA origin. Occluded proximal to mid right vertebral artery extracranially with reconstitution. Electronically Signed   By: Macy Mis M.D.   On: 09/24/2019 12:32    DG Chest 2 View  Result Date: 09/24/2019 CLINICAL DATA:  CVA EXAM: CHEST - 2 VIEW COMPARISON:  October 03, 2013 FINDINGS: There is no appreciable edema or consolidation. Heart is upper normal in size with pulmonary vascularity normal. No adenopathy. There is degenerative change in each shoulder and in the thoracic spine. There is thoracolumbar levoscoliosis. IMPRESSION: No edema or consolidation. Heart upper normal in size. No evident adenopathy. Electronically Signed   By: Lowella Grip III M.D.   On: 09/24/2019 09:08   CT ANGIO NECK W OR WO CONTRAST  Result Date: 09/24/2019 CLINICAL DATA:  Stroke, follow-up EXAM: CT ANGIOGRAPHY HEAD AND NECK TECHNIQUE: Multidetector CT imaging of the head and neck was performed using the standard protocol during bolus administration of intravenous contrast. Multiplanar CT image reconstructions and MIPs were obtained to evaluate the vascular anatomy. Carotid stenosis measurements (when applicable) are obtained utilizing NASCET criteria, using the distal internal carotid diameter as the denominator. CONTRAST:  179mL OMNIPAQUE IOHEXOL 350 MG/ML SOLN COMPARISON:  None. FINDINGS: CT HEAD FINDINGS Brain: There is no acute intracranial hemorrhage. Small acute infarcts in the right MCA territory are better seen on the prior MRI. There is no mass effect. Patchy and confluent areas of hypoattenuation in the supratentorial white matter are nonspecific but probably reflect moderate chronic microvascular ischemic changes. There are chronic right frontal, parietal, and occipital infarcts. Additional chronic left frontoparietal infarcts. Prominence of the ventricles and sulci reflects generalized parenchymal volume loss. Vascular: There  is intracranial atherosclerotic calcification at the skull base. Skull: Unremarkable. Sinuses: Minor mucosal thickening. Orbits: Bilateral lens replacements. Review of the MIP images confirms the above findings CTA NECK FINDINGS Aortic arch: Great  vessel origins are patent. Right carotid system: Common carotid is patent with mild atherosclerotic wall thickening. There is primarily calcified plaque at the bifurcation and along the proximal ICA. The degree of stenosis is difficult to measure exactly due to extensive calcification but is at least 80%. The remainder of the cervical ICA is patent. Caliber is slightly smaller than contralateral ICA though this is probably primarily due to absent right A1 ACA intracranially. External carotid is patent. Left carotid system: Common carotid is patent with mild atherosclerotic wall thickening. There is primarily calcified plaque at the bifurcation and along the proximal ICA causing less than 50% stenosis. Remainder of the cervical ICA is patent. External carotid is patent. Vertebral arteries: Dominant extracranial left vertebral artery is patent. There is calcified plaque at the origin causing stenosis. There is no visible opacification of the right vertebral artery to the C4 level where there is partial reconstitution. Additional muscular branch contribution is present more distally. Skeleton: Multilevel degenerative changes of the cervical spine. There is prominent retrodental soft tissue effacing the ventral subarachnoid space as seen on prior MRI. Other neck: No neck mass or adenopathy. Upper chest: No apical lung mass. Review of the MIP images confirms the above findings CTA HEAD FINDINGS Anterior circulation: Intracranial internal carotid arteries are patent with calcified plaque but no significant stenosis. Anterior and middle cerebral arteries are patent. 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 Posterior circulation: Intracranial vertebral arteries are patent. There is mild calcified plaque on the left. Basilar artery is patent. Posterior cerebral arteries are patent. A small left posterior communicating artery is noted. Venous sinuses: As permitted by contrast timing, patent. Review of the MIP images  confirms the above findings IMPRESSION: No acute intracranial hemorrhage. Small right MCA territory infarctions are better seen on recent MRI. Chronic findings are detailed above. Significant calcified plaque at the right ICA origin causing at least 80% stenosis. Less than 50% stenosis at the left ICA origin. Occluded proximal to mid right vertebral artery extracranially with reconstitution. Electronically Signed   By: Macy Mis M.D.   On: 09/24/2019 12:32   MR BRAIN WO CONTRAST  Result Date: 09/23/2019 CLINICAL DATA:  Visual changes going on for cervical days. EXAM: MRI HEAD WITHOUT CONTRAST TECHNIQUE: Multiplanar, multiecho pulse sequences of the brain and surrounding structures were obtained without intravenous contrast. COMPARISON:  CT head without contrast 12/17/2011. MR head without contrast 11/08/2010. FINDINGS: Brain: Diffusion-weighted images demonstrate several punctate foci of restricted diffusion within the right MCA distribution. The largest is in the superior right temporal lobe, measuring 5 mm. There is a punctate focus in the right caudate head. There is a punctate focus in a watershed distribution anteriorly on image 32 of series 4. A punctate focus is present in the right parietal lobe, alongside the atrium of the right lateral ventricle. No other focal areas of acute infarction are present. There is no acute infarct involving the visual cortex or optic tracts. Remote infarct is present in the superior right occipital lobe. There is chronic volume loss. Moderate generalized atrophy is present. Remote infarcts are present in the postcentral gyrus bilaterally, prominent right than left. The ventricles are proportionate to the degree of atrophy. No significant extraaxial fluid collection is present. White matter changes extend into the brainstem. The cerebellum is unremarkable.  Vascular: Abnormal signal is present in the right vertebral artery suggesting slow or likely occluded flow. Left  vertebral artery is the dominant vessel. Basilar artery is patent. Flow is present in the anterior circulation bilaterally. Skull and upper cervical spine: Prominent soft tissue pannus surrounds the odontoid. This narrows the spinal canal less than 10 mm. Upper cervical spinal cord displaced posteriorly. No abnormal cord signal is present. Normal signal is present at C2 and C3. Midline structures are otherwise unremarkable. Marrow signal is otherwise normal. Sinuses/Orbits: The paranasal sinuses and mastoid air cells are clear. Lens replacements are noted. Globes and orbits are otherwise within normal limits. IMPRESSION: 1. Multiple punctate foci of acute nonhemorrhagic infarction within the right MCA distribution as described. 2. Remote infarcts of the superior right occipital lobe. 3. No acute infarct involving the occipital cortex or optic tracts. 4. Moderate generalized atrophy and white matter disease likely reflects the sequela of chronic microvascular ischemia. 5. Prominent soft tissue pannus at the odontoid with mild mass effect on the cord but no abnormal cord signal. 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 These results were called by telephone at the time of interpretation on 09/23/2019 at 2:15 pm to provider MARK PERINI, who verbally acknowledged these results. Electronically Signed   By: San Morelle M.D.   On: 09/23/2019 14:16   ECHOCARDIOGRAM COMPLETE  Result Date: 09/24/2019   ECHOCARDIOGRAM REPORT   Patient Name:   HYDEN THAYN Date of Exam: 09/24/2019 Medical Rec #:  [id number]    Height:       69.0 in Accession #:    [id number]   Weight:       173.6 lb Date of Birth:  November 04, 1923   BSA:          1.95 m Patient Age:    68 years     BP:           151/95 mmHg Patient Gender: M            HR:           73 bpm. Exam Location:  Inpatient Procedure: 2D Echo, Color Doppler, Cardiac Doppler and Intracardiac            Opacification Agent Indications:    Stroke   History:        Patient has prior history of Echocardiogram examinations, most                 recent 12/12/2016. CAD, Arrythmias:Atrial Fibrillation; Risk                 Factors:Hypertension, Diabetes and Dyslipidemia.  Sonographer:    Raquel Sarna Senior RDCS Referring Phys: [id number] [place]  1. Left ventricular ejection fraction, by visual estimation, is 55 to 60%. The left ventricle has normal function. There is no left ventricular hypertrophy.  2. Indeterminate diastolic filling due to E-A fusion.  3. The left ventricle has no regional wall motion abnormalities.  4. Global right ventricle has normal systolic function.The right ventricular size is normal. No increase in right ventricular wall thickness.  5. Left atrial size was normal.  6. Right atrial size was normal.  7. The mitral valve is normal in structure. No evidence of mitral valve regurgitation. No evidence of mitral stenosis.  8. The tricuspid valve is grossly normal.  9. The aortic valve is grossly normal. Aortic valve regurgitation is not visualized. No evidence of aortic valve sclerosis or stenosis. 10. The pulmonic valve was not well  visualized. Pulmonic valve regurgitation is not visualized. 11. TR signal is inadequate for assessing pulmonary artery systolic pressure. 12. The inferior vena cava is normal in size with greater than 50% respiratory variability, suggesting right atrial pressure of 3 mmHg. FINDINGS  Left Ventricle: Left ventricular ejection fraction, by visual estimation, is 55 to 60%. The left ventricle has normal function. The left ventricle has no regional wall motion abnormalities. There is no left ventricular hypertrophy. Indeterminate diastolic filling due to E-A fusion. Normal left atrial pressure. Right Ventricle: The right ventricular size is normal. No increase in right ventricular wall thickness. Global RV systolic function is has normal systolic function. Left Atrium: Left atrial size was normal in size. Right  Atrium: Right atrial size was normal in size Pericardium: There is no evidence of pericardial effusion. Mitral Valve: The mitral valve is normal in structure. No evidence of mitral valve regurgitation. No evidence of mitral valve stenosis by observation. Tricuspid Valve: The tricuspid valve is grossly normal. Tricuspid valve regurgitation is not demonstrated. Aortic Valve: The aortic valve is grossly normal. Aortic valve regurgitation is not visualized. The aortic valve is structurally normal, with no evidence of sclerosis or stenosis. Pulmonic Valve: The pulmonic valve was not well visualized. Pulmonic valve regurgitation is not visualized. Pulmonic regurgitation is not visualized. Aorta: The aortic root, ascending aorta and aortic arch are all structurally normal, with no evidence of dilitation or obstruction. Venous: The inferior vena cava is normal in size with greater than 50% respiratory variability, suggesting right atrial pressure of 3 mmHg. IAS/Shunts: No atrial level shunt detected by color flow Doppler. There is no evidence of a patent foramen ovale. No ventricular septal defect is seen or detected. There is no evidence of an atrial septal defect.  LEFT VENTRICLE PLAX 2D LVIDd:         4.30 cm LVIDs:         3.40 cm LV PW:         0.90 cm LV IVS:        1.10 cm LVOT diam:     1.90 cm LV SV:         36 ml LV SV Index:   18.10 LVOT Area:     2.84 cm  RIGHT VENTRICLE RV S prime:     14.40 cm/s TAPSE (M-mode): 2.0 cm LEFT ATRIUM             Index       RIGHT ATRIUM           Index LA diam:        3.00 cm 1.54 cm/m  RA Area:     21.00 cm LA Vol (A2C):   79.8 ml 41.02 ml/m RA Volume:   63.10 ml  32.44 ml/m LA Vol (A4C):   46.7 ml 24.01 ml/m LA Biplane Vol: 64.1 ml 32.95 ml/m  AORTIC VALVE LVOT Vmax:   63.30 cm/s LVOT Vmean:  43.700 cm/s LVOT VTI:    0.119 m  AORTA Ao Root diam: 2.40 cm  SHUNTS Systemic VTI:  0.12 m Systemic Diam: 1.90 cm  Sanda Klein MD Electronically signed by Sanda Klein MD  Signature Date/Time: 09/24/2019/1:50:21 PM    Final       12/24: Transthoracic Echocardiogram IMPRESSIONS   1. Left ventricular ejection fraction, by visual estimation, is 55 to 60%. The left ventricle has normal function. There is no left ventricular hypertrophy. 2. Indeterminate diastolic filling due to E-A fusion. 3. The left ventricle has no regional wall  motion abnormalities. 4. Global right ventricle has normal systolic function.The right ventricular size is normal. No increase in right ventricular wall thickness. 5. Left atrial size was normal. 6. Right atrial size was normal. 7. The mitral valve is normal in structure. No evidence of mitral valve regurgitation. No evidence of mitral stenosis. 8. The tricuspid valve is grossly normal. 9. The aortic valve is grossly normal. Aortic valve regurgitation is not visualized. No evidence of aortic valve sclerosis or stenosis. 10. The pulmonic valve was not well visualized. Pulmonic valve regurgitation is not visualized. 11. TR signal is inadequate for assessing pulmonary artery systolic pressure. 12. The inferior vena cava is normal in size with greater than 50% respiratory variability, suggesting right atrial pressure of 3 mmHg.   Subjective: No concerns today.  Discharge Exam: Vitals:   09/26/19 0740 09/26/19 1146  BP: (!) 160/88 122/75  Pulse: 79 87  Resp: 18 16  Temp: 97.8 F (36.6 C) (!) 97.5 F (36.4 C)  SpO2: 97% 99%   Vitals:   09/26/19 0000 09/26/19 0400 09/26/19 0740 09/26/19 1146  BP: (!) 142/84 (!) 149/68 (!) 160/88 122/75  Pulse: 78 75 79 87  Resp: 20 20 18 16   Temp: 98.1 F (36.7 C) 98.4 F (36.9 C) 97.8 F (36.6 C) (!) 97.5 F (36.4 C)  TempSrc: Oral Oral Oral Oral  SpO2: 99% 96% 97% 99%  Weight:      Height:        General: Pt is alert, awake, not in acute distress Cardiovascular: RRR, S1/S2 +, no rubs, no gallops Respiratory: CTA bilaterally, no wheezing, no rhonchi Abdominal: Soft,  NT, ND, bowel sounds + Extremities: no edema, no cyanosis    The results of significant diagnostics from this hospitalization (including imaging, microbiology, ancillary and laboratory) are listed below for reference.     Microbiology: Recent Results (from the past 240 hour(s))  SARS CORONAVIRUS 2 (TAT 6-24 HRS) Nasopharyngeal Nasopharyngeal Swab     Status: None   Collection Time: 09/24/19  7:34 AM   Specimen: Nasopharyngeal Swab  Result Value Ref Range Status   SARS Coronavirus 2 NEGATIVE NEGATIVE Final    Comment: (NOTE) SARS-CoV-2 target nucleic acids are NOT DETECTED. The SARS-CoV-2 RNA is generally detectable in upper and lower respiratory specimens during the acute phase of infection. Negative results do not preclude SARS-CoV-2 infection, do not rule out co-infections with other pathogens, and should not be used as the sole basis for treatment or other patient management decisions. Negative results must be combined with clinical observations, patient history, and epidemiological information. The expected result is Negative. Fact Sheet for Patients: SugarRoll.be Fact Sheet for Healthcare Providers: https://www.woods-mathews.com/ This test is not yet approved or cleared by the [place] FDA and  has been authorized for detection and/or diagnosis of SARS-CoV-2 by FDA under an Emergency Use Authorization (EUA). This EUA will remain  in effect (meaning this test can be used) for the duration of the COVID-19 declaration under Section 56 4(b)(1) of the Act, 21 U.S.C. section 360bbb-3(b)(1), unless the authorization is terminated or revoked sooner. Performed at [facility], [place] [address], [place], [place] [address]   Urine culture     Status: Abnormal   Collection Time: 09/24/19 12:22 PM   Specimen: Urine, Random  Result Value Ref Range Status   Specimen Description URINE, RANDOM  Final   Special Requests   Final     NONE Performed at [facility], [place] [address] [place], [place]  [address]    Culture MULTIPLE SPECIES PRESENT, SUGGEST RECOLLECTION (A)  Final   Report Status 09/25/2019 FINAL  Final     Labs: BNP (last 3 results) No results for input(s): BNP in the last 8760 hours. Basic Metabolic Panel: Recent Labs  Lab 09/23/19 1655 09/24/19 0930  NA 143 144  K 4.3 4.5  CL 111 109  CO2 21* 21*  GLUCOSE 102* 87  BUN 19 19  CREATININE 1.19 1.04  CALCIUM 9.9 9.7   Liver Function Tests: Recent Labs  Lab 09/23/19 1655 09/24/19 0930  AST 29 27  ALT 20 20  ALKPHOS 45 41  BILITOT 1.5* 1.5*  PROT 6.5 6.7  ALBUMIN 3.8 3.9   No results for input(s): LIPASE, AMYLASE in the last 168 hours. No results for input(s): AMMONIA in the last 168 hours. CBC: Recent Labs  Lab 09/23/19 1655 09/24/19 0930 09/25/19 0244  WBC 12.2* 11.6* 9.5  NEUTROABS 7.9*  --  4.8  HGB 14.5 15.1 13.9  HCT 43.8 44.7 40.4  MCV 99.8 98.9 97.3  PLT 263 257 248   Cardiac Enzymes: No results for input(s): CKTOTAL, CKMB, CKMBINDEX, TROPONINI in the last 168 hours. BNP: Invalid input(s): POCBNP CBG: Recent Labs  Lab 09/25/19 1203 09/25/19 1657 09/25/19 2145 09/26/19 0644 09/26/19 1210  GLUCAP 119* 177* 161* 104* 144*   D-Dimer No results for input(s): DDIMER in the last 72 hours. Hgb A1c Recent Labs    09/25/19 0244  HGBA1C 7.1*   Lipid Profile Recent Labs    09/25/19 0244  CHOL 97  HDL 30*  LDLCALC 38  TRIG 145  CHOLHDL 3.2   Thyroid function studies Recent Labs    09/24/19 0933  TSH 1.451   Anemia work up No results for input(s): VITAMINB12, FOLATE, FERRITIN, TIBC, IRON, RETICCTPCT in the last 72 hours. Urinalysis    Component Value Date/Time   COLORURINE YELLOW 09/24/2019 1222   APPEARANCEUR CLOUDY (A) 09/24/2019 1222   LABSPEC 1.017 09/24/2019 1222   PHURINE 6.0 09/24/2019 1222   GLUCOSEU >=500 (A) 09/24/2019 1222   HGBUR NEGATIVE 09/24/2019 1222   BILIRUBINUR NEGATIVE  09/24/2019 1222   KETONESUR 20 (A) 09/24/2019 1222   PROTEINUR NEGATIVE 09/24/2019 1222   UROBILINOGEN 1.0 10/03/2013 1430   NITRITE NEGATIVE 09/24/2019 1222   LEUKOCYTESUR LARGE (A) 09/24/2019 1222   Sepsis Labs Invalid input(s): PROCALCITONIN,  WBC,  LACTICIDVEN Microbiology Recent Results (from the past 240 hour(s))  SARS CORONAVIRUS 2 (TAT 6-24 HRS) Nasopharyngeal Nasopharyngeal Swab     Status: None   Collection Time: 09/24/19  7:34 AM   Specimen: Nasopharyngeal Swab  Result Value Ref Range Status   SARS Coronavirus 2 NEGATIVE NEGATIVE Final    Comment: (NOTE) SARS-CoV-2 target nucleic acids are NOT DETECTED. The SARS-CoV-2 RNA is generally detectable in upper and lower respiratory specimens during the acute phase of infection. Negative results do not preclude SARS-CoV-2 infection, do not rule out co-infections with other pathogens, and should not be used as the sole basis for treatment or other patient management decisions. Negative results must be combined with clinical observations, patient history, and epidemiological information. The expected result is Negative. Fact Sheet for Patients: SugarRoll.be Fact Sheet for Healthcare Providers: https://www.woods-mathews.com/ This test is not yet approved or cleared by the [place] FDA and  has been authorized for detection and/or diagnosis of SARS-CoV-2 by FDA under an Emergency Use Authorization (EUA). This EUA will remain  in effect (meaning this test can be used) for the duration of  the COVID-19 declaration under Section 56 4(b)(1) of the Act, 21 U.S.C. section 360bbb-3(b)(1), unless the authorization is terminated or revoked sooner. Performed at [facility], [place] [address] [place], [place] [address]   Urine culture     Status: Abnormal   Collection Time: 09/24/19 12:22 PM   Specimen: Urine, Random  Result Value Ref Range Status   Specimen Description URINE, RANDOM   Final   Special Requests   Final    NONE Performed at [facility], [place] [address], [place], [place] [address]    Culture MULTIPLE SPECIES PRESENT, SUGGEST RECOLLECTION (A)  Final   Report Status 09/25/2019 FINAL  Final     Time coordinating discharge: 35 minutes  SIGNED:   Cordelia Poche, MD [facility] 09/26/2019, 12:16 PM

## 2019-09-26 NOTE — Discharge Instructions (Addendum)
[name],  You were in the hospital secondary to a stroke. You have been started on Eliquis. You will need to follow-up with the neurologist and vascular surgeon.   Information on my medicine - ELIQUIS (apixaban)  Why was Eliquis prescribed for you? Eliquis was prescribed for you to reduce the risk of forming blood clots that can cause a stroke if you have a medical condition called atrial fibrillation (a type of irregular heartbeat) OR to reduce the risk of a blood clots forming after orthopedic surgery.  What do You need to know about Eliquis ? Take your Eliquis TWICE DAILY - one tablet in the morning and one tablet in the evening with or without food.  It would be best to take the doses about the same time each day.  If you have difficulty swallowing the tablet whole please discuss with your pharmacist how to take the medication safely.  Take Eliquis exactly as prescribed by your doctor and DO NOT stop taking Eliquis without talking to the doctor who prescribed the medication.  Stopping may increase your risk of developing a new clot or stroke.  Refill your prescription before you run out.  After discharge, you should have regular check-up appointments with your healthcare provider that is prescribing your Eliquis.  In the future your dose may need to be changed if your kidney function or weight changes by a significant amount or as you get older.  What do you do if you miss a dose? If you miss a dose, take it as soon as you remember on the same day and resume taking twice daily.  Do not take more than one dose of ELIQUIS at the same time.  Important Safety Information A possible side effect of Eliquis is bleeding. You should call your healthcare provider right away if you experience any of the following: ? Bleeding from an injury or your nose that does not stop. ? Unusual colored urine (red or dark brown) or unusual colored stools (red or black). ? Unusual bruising for  unknown reasons. ? A serious fall or if you hit your head (even if there is no bleeding).  Some medicines may interact with Eliquis and might increase your risk of bleeding or clotting while on Eliquis. To help avoid this, consult your healthcare provider or pharmacist prior to using any new prescription or non-prescription medications, including herbals, vitamins, non-steroidal anti-inflammatory drugs (NSAIDs) and supplements.  This website has more information on Eliquis (apixaban): www.DubaiSkin.no.

## 2019-09-26 NOTE — Progress Notes (Signed)
[facility]  Subjective  -no new neurologic symptoms overnight.  Patient's discussed with his daughter and decided to proceed with right carotid endarterectomy.   Objective (!) 160/88 79 97.8 F (36.6 C) (Oral) 18 97%  Intake/Output Summary (Last 24 hours) at 09/26/2019 1056 Last data filed at 09/26/2019 0940 Gross per 24 hour  Intake 520 ml  Output 475 ml  Net 45 ml    Cranial nerves II through XII grossly intact. No gross neurologic deficits.  Laboratory Lab Results: Recent Labs    09/24/19 0930 09/25/19 0244  WBC 11.6* 9.5  HGB 15.1 13.9  HCT 44.7 40.4  PLT 257 248   BMET Recent Labs    09/23/19 1655 09/24/19 0930  NA 143 144  K 4.3 4.5  CL 111 109  CO2 21* 21*  GLUCOSE 102* 87  BUN 19 19  CREATININE 1.19 1.04  CALCIUM 9.9 9.7    COAG Lab Results  Component Value Date   INR 1.0 09/23/2019   INR 1.01 12/11/2016   INR 1.37 09/26/2013   No results found for: PTT  Assessment/Planning:  85 year old male with high-grade right carotid stenosis in setting of right MCA distribution nonhemorrhagic infarcts.  Given that patient lives independently and is very functional including driving a car, I have recommended right carotid endarterectomy.  Discussed extensively with the patient's daughter and him yesterday.  Ultimately they have decided to proceed with surgery.  Looking at the OR schedule next week, I have him the option of Thursday, December 31 versus Wednesday January 6.  Patient would prefer January 6.  He can be discharged in the interim from my standpoint.  My office will contact him to schedule surgery.  He will need to hold Eliquis for 48 hours prior to surgery.  Can continue aspirin and plavix.  Marty Heck 09/26/2019 10:56 AM --

## 2019-09-30 ENCOUNTER — Other Ambulatory Visit: Payer: Self-pay | Admitting: *Deleted

## 2019-09-30 ENCOUNTER — Telehealth: Payer: Self-pay | Admitting: *Deleted

## 2019-09-30 ENCOUNTER — Encounter: Payer: Self-pay | Admitting: *Deleted

## 2019-09-30 NOTE — Progress Notes (Signed)
Spoke with patient's daughter [name]. Instructed to hold Eliquis x 48 hours pre-op. Expect a call and follow the detailed surgery instructions received from the hospital pre-admission department. They will give instructions for the pre-op nasal swab and medications to take morning of surgery and any insulin adjustment. To be at he admitting department at 11 am or as directed by the hospital on 10/07/2019. Verbalized understanding.

## 2019-09-30 NOTE — Telephone Encounter (Signed)
Verified with Dr. Carlis Abbott. Patient to take Plavix x 2 days while Eliquis on hold for surgery. Instructions given to patient's daughter.

## 2019-09-30 NOTE — Progress Notes (Signed)
Patient states received dose ONE of Covid vaccine 09/29/2019.

## 2019-10-02 DIAGNOSIS — I214 Non-ST elevation (NSTEMI) myocardial infarction: Secondary | ICD-10-CM

## 2019-10-02 HISTORY — DX: Non-ST elevation (NSTEMI) myocardial infarction: I21.4

## 2019-10-05 ENCOUNTER — Other Ambulatory Visit (HOSPITAL_COMMUNITY)
Admission: RE | Admit: 2019-10-05 | Discharge: 2019-10-05 | Disposition: A | Discharge status: Home / Self Care | Payer: Medicare Other | Source: Ambulatory Visit | Attending: Vascular Surgery | Admitting: Vascular Surgery

## 2019-10-05 ENCOUNTER — Other Ambulatory Visit: Payer: Self-pay

## 2019-10-05 ENCOUNTER — Encounter (HOSPITAL_COMMUNITY): Payer: Self-pay | Admitting: Vascular Surgery

## 2019-10-05 DIAGNOSIS — Z23 Encounter for immunization: Secondary | ICD-10-CM | POA: Diagnosis not present

## 2019-10-05 LAB — SARS CORONAVIRUS 2 (TAT 6-24 HRS): SARS Coronavirus 2: NEGATIVE

## 2019-10-05 NOTE — Progress Notes (Signed)
Spoke with pt for pre-op call. Pt verified his medical and surgical history with me. Pt has hx of CAD and A-fib. Dr. [name] is his cardiologist. Pt was in the hospital over Christmas with vision issues that were related with possible stroke. Pt had been on Plavix prior to hospitalization but went home on Eliquis. His last dose of Eliquis was today and Dr. Carlis Abbott instructed him to take his Plavix starting today prior to surgery. Pt states he has done that. Pt is a type 2 diabetic, last A1C was 7.1 on 09/25/19. Pt states his fasting blood sugar is usually in the hight 130's.  Instructed pt to hold his Glyxambi Tuesday and Wednesday AM. Pt at this point asked that I call his daughter, [name] with the instructions, which I did. I told her about him needing to hold the [facility] and for him to take 1/2 of his regular dose of Lantus Wednesday AM (he will take 7 units). Instructed [name] to have pt check his blood sugar when he gets up Wednesday AM and every 2 hours until he leaves for the hospital. If blood sugar is 70 or below, treat with 1/2 cup of clear juice (apple or cranberry) and recheck blood sugar 15 minutes after drinking juice. If blood sugar continues to be 70 or below, call the Short Stay department and ask to speak to a nurse. [name] voiced understanding.  Pt has had the first dose of the Covid Vaccine on 09/29/19. I verified with Liliane Bade, RN that pt still needed the Covid test done and she stated yes. I called pt back and told him this and asked if he wanted to do it today or tomorrow, he states he would go today. I instructed him to go anytime between now and 2:45 PM and he states he will. Instructed him about being in quarantine once he has the test and he voiced understanding of the quarantine instructions.   I reviewed the Visitor policy with [name] and she voiced understanding.

## 2019-10-06 NOTE — Progress Notes (Signed)
Anesthesia Chart Review: Kathleene Hazel   Case: [id number] Date/Time: 10/07/19 1245   Procedure: ENDARTERECTOMY CAROTID RIGHT (Right )   Anesthesia type: General   Pre-op diagnosis: RIGHT CAROTID ARTERY STENOSIS   Location: [facility] OR ROOM 11 / [facility]   Surgeons: Marty Heck, MD      DISCUSSION: Patient is a 86 year old male scheduled for the above procedure. He has symptomatic right ICA stenosis with history of blurred right eye vision 09/21/19 that improved by the next day. He followed up with his PCP, and MRI ordered showing infarcts in right MCA region and sent to the ED for further CVA work-up. Work-up revealed right ICA stenosis and was felt to be the likely source of CVA. Also with known Afib history (had been on Plavix and not anticoagulants due to GI bleed history, but started on Eliquis after CVA). Seen by neurology and vascular surgery, and the above procedure recommended.  History includes never smoker, CAD (STEMI, s/p DES LAD 12/11/16, medical therapy for CX as no great PCI options), PAF/afib, CVA (11/2010 right brain CVA; 09/23/19 MRI: multiple nonhemorrhagic infarcts right MCA distribution, remote infarct superior right occipital lobe). GI bleed (esophagitis, massive duodenal visible vessel in setting of ulcer s/p hemoclipping 09/25/13), HTN, HLD, DM2, PVD, hypothyroidism.   Per PAT RN phone notation, "Pt had been on Plavix prior to hospitalization but went home on Eliquis. His last dose of Eliquis was today and Dr. Carlis Abbott instructed him to take his Plavix starting today [10/05/19] prior to surgery."  He received a COVID-19 vaccine (unknown which brand) on 09/29/19. 10/04/18 COVID-19 test negative. Anesthesia team to evaluate on the day of surgery. He needs a T&S on arrival, but other labs would be per Psychologist, sport and exercise.   VS: There were no vitals taken for this visit.  BP Readings from Last 3 Encounters:  09/26/19 122/75  12/08/18 (!) 128/56  06/16/18 (!) 150/73   Pulse Readings from Last 3  Encounters:  09/26/19 87  12/08/18 72  06/16/18 65    PROVIDERS: Reynold Bowen, MD is PCP  Glenetta Hew, MD is cardiologist. Last evaluation 12/10/18. Continue medical therapy for CAD. No anginal symptoms.  Was holding SR at that time.  Antony Contras, MD is neurologist. Seen during 09/2019 CVA hospitalization. Following surgery, he recommended on-going anticoagulation if no felt high risk for GI bleed.    LABS: Labs as of 09/25/19 include: Lab Results  Component Value Date   WBC 9.5 09/25/2019   HGB 13.9 09/25/2019   HCT 40.4 09/25/2019   PLT 248 09/25/2019   GLUCOSE 87 09/24/2019   ALT 20 09/24/2019   AST 27 09/24/2019   NA 144 09/24/2019   K 4.5 09/24/2019   CL 109 09/24/2019   CREATININE 1.04 09/24/2019   BUN 19 09/24/2019   CO2 21 (L) 09/24/2019   TSH 1.451 09/24/2019   INR 1.0 09/23/2019   HGBA1C 7.1 (H) 09/25/2019   Urine culture 09/24/19: Multiple species present, suggest recollection. Treated with Ceftriaxone for suspected UTI while in hospital.  There are additional labs orders entered per vascular surgery. Would need a T&S from an anesthesia standpoint. PT/PTT WNL on 09/23/19.     IMAGES: CTA head/neck 09/24/19: IMPRESSION: - No acute intracranial hemorrhage. Small right MCA territory infarctions are better seen on recent MRI. Chronic findings are detailed above. - Significant calcified plaque at the right ICA origin causing at least 80% stenosis. Less than 50% stenosis at the left ICA origin. 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  CXR 09/24/19: FINDINGS: There is no appreciable edema or consolidation. Heart is upper normal in size with pulmonary vascularity normal. No adenopathy. There is degenerative change in each shoulder and in the thoracic spine. There is thoracolumbar levoscoliosis. IMPRESSION: No edema or consolidation. Heart upper normal in size. No evident adenopathy.   EKG:  09/23/19: Sinus rhythm with 1st degree A-V block with frequent Premature ventricular complexes Septal infarct , age undetermined Abnormal ECG NO STEMI. Otherwise no significant change Confirmed by Addison Lank [phone]) on 09/24/2019 6:00:42 AM   CV: Echo 09/24/19: IMPRESSIONS  1. Left ventricular ejection fraction, by visual estimation, is 55 to 60%. The left ventricle has normal function. There is no left ventricular hypertrophy.  2. Indeterminate diastolic filling due to E-A fusion.  3. The left ventricle has no regional wall motion abnormalities.  4. Global right ventricle has normal systolic function.The right ventricular size is normal. No increase in right ventricular wall thickness.  5. Left atrial size was normal.  6. Right atrial size was normal.  7. The mitral valve is normal in structure. No evidence of mitral valve regurgitation. No evidence of mitral stenosis.  8. The tricuspid valve is grossly normal.  9. The aortic valve is grossly normal. Aortic valve regurgitation is not visualized. No evidence of aortic valve sclerosis or stenosis. 10. The pulmonic valve was not well visualized. Pulmonic valve regurgitation is not visualized. 11. TR signal is inadequate for assessing pulmonary artery systolic pressure. 12. The inferior vena cava is normal in size with greater than 50% respiratory variability, suggesting right atrial pressure of 3 mmHg.  Cardiac cath 12/11/16 [name], Shanon Brow, MD):  Mid LAD lesion, 99 %stenosed.  A STENT SYNERGY DES 2.75X16 drug eluting stent was successfully placed - postdilated to 3.0 mm  Post intervention, there is a 0% residual stenosis.  ___________________________________  LM lesion, 45 %stenosed. Questionable significance. In several views it looks to be significant. However as he is not had symptoms prior to this episode, likely not symptom limiting. Plan medical management  Ost Cx to Prox Cx lesion, 60 %stenosed. Ost 1st Mrg to 1st Mrg  lesion, 60 %stenosed. - Medical managment  Dist LAD-2 lesion, 40 %stenosed. Dist LAD-1 lesion, 50 %stenosed. Ost 1st Diag to 1st Diag lesion, 50 %stenosed.  The left ventricular systolic function is normal.  LV end diastolic pressure is normal.  The left ventricular ejection fraction is 50-55% by visual estimate.  There is trivial (1+) mitral regurgitation.  There is no aortic valve stenosis. There is no aortic valve regurgitation. - Successful PCI of the culprit lesion for his acute MI. One single DES stent was placed. Synergy was chosen because of his history of GI bleed on Plavix. [Plan ASA + Plavix x 3 months if possible, then d/c ASA] - There is existing disease in the left main and ostial circumflex-OM1. These lesions do seem concerning, however in the lack of antecedent anginal symptoms, I felt that the best course of action was PCI on the LAD with medical management only of the lesions based on his age.   Cardiac event monitor in 2014 showed NSR with PVCs, no afib.   Past Medical History:  Diagnosis Date  . Anemia   . Arthritis   . CAD S/P percutaneous coronary angioplasty    a. 11/2016: anterior STEMI s/p DES to LAD.   . Diabetes mellitus without complication ([place])    Diet controlled  . GERD (gastroesophageal reflux disease)   .  GI bleed    10/2013: 2/2 duodenal ulcer. ASA/plavix discontinued.   . Hyperlipemia   . Hypertension   . Hypothyroidism   . PAF (paroxysmal atrial fibrillation) ([place])    a. noted during admission in 10/2013 for acute GI bleed. No recurrunce since. Monitor in 2014 with only PAC/PVCs  . Peripheral vascular disease ([place])   . ST elevation myocardial infarction (STEMI) involving left anterior descending (LAD) coronary artery with complication ([place]) [id number]   - 99% subtotal occlusion of LAD treated with DES stent.  . Stroke [facility])    a. ichemic CVA in 2012.     Past Surgical History:  Procedure Laterality Date  . CORONARY STENT INTERVENTION N/A  12/11/2016   Procedure: Coronary Stent Intervention-Mid LAD;  Surgeon: Leonie Man, MD;  Location: [facility];  Service: Cardiovascular:  mLAD 99%-0% Synergy DES 2.75 x 16 (3.0 mm)  . ESOPHAGOGASTRODUODENOSCOPY N/A 09/23/2013   Procedure: ESOPHAGOGASTRODUODENOSCOPY (EGD);  Surgeon: Lafayette Dragon, MD;  Location: [facility] ENDOSCOPY;  Service: Endoscopy;  Laterality: N/A;  . ESOPHAGOGASTRODUODENOSCOPY N/A 09/25/2013   Procedure: ESOPHAGOGASTRODUODENOSCOPY (EGD);  Surgeon: Beryle Beams, MD;  Location: [facility] ENDOSCOPY;  Service: Endoscopy;  Laterality: N/A;  . ESOPHAGOGASTRODUODENOSCOPY N/A 12/18/2013   Procedure: ESOPHAGOGASTRODUODENOSCOPY (EGD);  Surgeon: Beryle Beams, MD;  Location: [facility] END[facility];  Service: Endoscopy;  Laterality: N/A;  . LEFT HEART CATH AND CORONARY ANGIOGRAPHY N/A 12/11/2016   Procedure: Left Heart Cath and Coronary Angiography;  Surgeon: Leonie Man, MD;  Location: [facility];  Service: Cardiovascular: LM 45%. mLAD 99% (PCI), dLAD 40% & 50%, ostD1 - 50%. Ost-prox Cx 60%, OstOM1 60% (Med Rx).  Anteroapical HK - EF 50-55%. Normal LVEDP  . TOTAL HIP ARTHROPLASTY Right 04/28/2013   Procedure: RIGHT TOTAL HIP ARTHROPLASTY ANTERIOR APPROACH;  Surgeon: Mauri Pole, MD;  Location: [facility] ORS;  Service: Orthopedics;  Laterality: Right;  . TRANSTHORACIC ECHOCARDIOGRAM  12/12/2016   Status post anterior STEMI:  Normal LV size with normal function. EF 60 deficits 5% with no regional wall motion abnormality. GR 2 DD. Mild LA dilation.    MEDICATIONS: No current facility-administered medications for this encounter.   . clopidogrel (PLAVIX) 75 MG tablet  . apixaban (ELIQUIS) 5 MG TABS tablet  . Empagliflozin-linaGLIPtin (GLYXAMBI) 10-5 MG TABS  . ergocalciferol (VITAMIN D2) 50000 UNITS capsule  . LANTUS SOLOSTAR 100 UNIT/ML Solostar Pen  . levothyroxine (SYNTHROID, LEVOTHROID) 50 MCG tablet  . metoprolol tartrate (LOPRESSOR) 25 MG tablet  . nitroGLYCERIN (NITROSTAT) 0.4 MG  SL tablet  . pantoprazole (PROTONIX) 40 MG tablet  . rosuvastatin (CRESTOR) 20 MG tablet    Myra Gianotti, PA-C Surgical Short Stay/Anesthesiology [facility] Phone [phone] [facility] Phone [phone] 10/06/2019 1:15 PM

## 2019-10-06 NOTE — Anesthesia Preprocedure Evaluation (Addendum)
Anesthesia Evaluation  Patient identified by MRN, date of birth, ID band Patient awake    Reviewed: Allergy & Precautions, NPO status , Patient's Chart, lab work & pertinent test results, reviewed documented beta blocker date and time   History of Anesthesia Complications Negative for: history of anesthetic complications  Airway Mallampati: I  TM Distance: >3 FB Neck ROM: Full    Dental  (+) Dental Advisory Given, Teeth Intact   Pulmonary neg pulmonary ROS,    Pulmonary exam normal        Cardiovascular hypertension, Pt. on home beta blockers and Pt. on medications (-) angina+ CAD, + Past MI, + Cardiac Stents and + Peripheral Vascular Disease  Normal cardiovascular exam+ dysrhythmias Atrial Fibrillation    '20 TTE - EF 55-60%, no valvulopathy  '18 Cath - Mid LAD lesion, 99 %stenosed. A STENT SYNERGY DES 2.75X16 drug eluting stent was successfully placed - postdilated to 3.0 mm Post intervention, there is a 0% residual stenosis. LM lesion, 45 %stenosed. Questionable significance. In several views it looks to be significant. However as he is not had symptoms prior to this episode, likely not symptom limiting. Plan medical management Ost Cx to Prox Cx lesion, 60 %stenosed. Ost 1st Mrg to 1st Mrg lesion, 60 %stenosed. - Medical managment Dist LAD-2 lesion, 40 %stenosed. Dist LAD-1 lesion, 50 %stenosed. Ost 1st Diag to 1st Diag lesion, 50 %stenosed. The left ventricular systolic function is normal. LV end diastolic pressure is normal. The LVEF is 50-55% by visual estimate. There is trivial (1+) mitral regurgitation. There is no aortic valve stenosis. There is no aortic valve regurgitation.  '20 CTA Neck - Significant calcified plaque at the right ICA origin causing at least 80% stenosis. Less than 50% stenosis at the left ICA origin.    Neuro/Psych CVA, Residual Symptoms negative psych ROS   GI/Hepatic Neg liver ROS,  PUD, GERD  Medicated and Controlled,  Endo/Other  diabetes, Type 2, Insulin DependentHypothyroidism   Renal/GU negative Renal ROS     Musculoskeletal  (+) Arthritis ,   Abdominal   Peds  Hematology negative hematology ROS (+)   Anesthesia Other Findings Covid neg 1/4   Reproductive/Obstetrics                           Anesthesia Physical Anesthesia Plan  ASA: III  Anesthesia Plan: General   Post-op Pain Management:    Induction: Intravenous  PONV Risk Score and Plan: 2 and Treatment may vary due to age or medical condition, Ondansetron and Propofol infusion  Airway Management Planned: Oral ETT  Additional Equipment: Arterial line  Intra-op Plan:   Post-operative Plan: Extubation in OR  Informed Consent: I have reviewed the patients History and Physical, chart, labs and discussed the procedure including the risks, benefits and alternatives for the proposed anesthesia with the patient or authorized representative who has indicated his/her understanding and acceptance.     Dental advisory given  Plan Discussed with: CRNA and Anesthesiologist  Anesthesia Plan Comments:       Anesthesia Quick Evaluation

## 2019-10-07 ENCOUNTER — Other Ambulatory Visit: Payer: Self-pay

## 2019-10-07 ENCOUNTER — Inpatient Hospital Stay (HOSPITAL_COMMUNITY): Payer: Medicare Other | Admitting: Vascular Surgery

## 2019-10-07 ENCOUNTER — Encounter (HOSPITAL_COMMUNITY)
Admission: RE | Disposition: A | Discharge status: Home / Self Care | Payer: Self-pay | Source: Home / Self Care | Attending: Vascular Surgery

## 2019-10-07 ENCOUNTER — Encounter (HOSPITAL_COMMUNITY): Payer: Self-pay | Admitting: Vascular Surgery

## 2019-10-07 ENCOUNTER — Inpatient Hospital Stay (HOSPITAL_COMMUNITY)
Admission: RE | Admit: 2019-10-07 | Discharge: 2019-10-08 | DRG: 039 | Disposition: A | Discharge status: Home / Self Care | Payer: Medicare Other | Attending: Vascular Surgery | Admitting: Vascular Surgery

## 2019-10-07 DIAGNOSIS — M1990 Unspecified osteoarthritis, unspecified site: Secondary | ICD-10-CM | POA: Diagnosis present

## 2019-10-07 DIAGNOSIS — Z8673 Personal history of transient ischemic attack (TIA), and cerebral infarction without residual deficits: Secondary | ICD-10-CM | POA: Diagnosis not present

## 2019-10-07 DIAGNOSIS — E785 Hyperlipidemia, unspecified: Secondary | ICD-10-CM | POA: Diagnosis present

## 2019-10-07 DIAGNOSIS — Z955 Presence of coronary angioplasty implant and graft: Secondary | ICD-10-CM | POA: Diagnosis not present

## 2019-10-07 DIAGNOSIS — I10 Essential (primary) hypertension: Secondary | ICD-10-CM | POA: Diagnosis present

## 2019-10-07 DIAGNOSIS — I480 Paroxysmal atrial fibrillation: Secondary | ICD-10-CM | POA: Diagnosis present

## 2019-10-07 DIAGNOSIS — Z7902 Long term (current) use of antithrombotics/antiplatelets: Secondary | ICD-10-CM

## 2019-10-07 DIAGNOSIS — I2510 Atherosclerotic heart disease of native coronary artery without angina pectoris: Secondary | ICD-10-CM | POA: Diagnosis present

## 2019-10-07 DIAGNOSIS — I6521 Occlusion and stenosis of right carotid artery: Secondary | ICD-10-CM

## 2019-10-07 DIAGNOSIS — Z91048 Other nonmedicinal substance allergy status: Secondary | ICD-10-CM

## 2019-10-07 DIAGNOSIS — Z8711 Personal history of peptic ulcer disease: Secondary | ICD-10-CM | POA: Diagnosis not present

## 2019-10-07 DIAGNOSIS — Z20822 Contact with and (suspected) exposure to covid-19: Secondary | ICD-10-CM | POA: Diagnosis present

## 2019-10-07 DIAGNOSIS — Z794 Long term (current) use of insulin: Secondary | ICD-10-CM

## 2019-10-07 DIAGNOSIS — Z79890 Hormone replacement therapy (postmenopausal): Secondary | ICD-10-CM | POA: Diagnosis not present

## 2019-10-07 DIAGNOSIS — Z79899 Other long term (current) drug therapy: Secondary | ICD-10-CM | POA: Diagnosis not present

## 2019-10-07 DIAGNOSIS — E1151 Type 2 diabetes mellitus with diabetic peripheral angiopathy without gangrene: Secondary | ICD-10-CM | POA: Diagnosis present

## 2019-10-07 DIAGNOSIS — K219 Gastro-esophageal reflux disease without esophagitis: Secondary | ICD-10-CM | POA: Diagnosis present

## 2019-10-07 DIAGNOSIS — I6529 Occlusion and stenosis of unspecified carotid artery: Secondary | ICD-10-CM | POA: Diagnosis present

## 2019-10-07 DIAGNOSIS — E039 Hypothyroidism, unspecified: Secondary | ICD-10-CM | POA: Diagnosis present

## 2019-10-07 DIAGNOSIS — M25562 Pain in left knee: Secondary | ICD-10-CM | POA: Diagnosis present

## 2019-10-07 DIAGNOSIS — I252 Old myocardial infarction: Secondary | ICD-10-CM | POA: Diagnosis not present

## 2019-10-07 DIAGNOSIS — Z96641 Presence of right artificial hip joint: Secondary | ICD-10-CM | POA: Diagnosis present

## 2019-10-07 HISTORY — DX: Hypothyroidism, unspecified: E03.9

## 2019-10-07 HISTORY — PX: ENDARTERECTOMY: SHX5162

## 2019-10-07 HISTORY — DX: Peripheral vascular disease, unspecified: I73.9

## 2019-10-07 LAB — COMPREHENSIVE METABOLIC PANEL
ALT: 17 U/L (ref 0–44)
AST: 25 U/L (ref 15–41)
Albumin: 3.7 g/dL (ref 3.5–5.0)
Alkaline Phosphatase: 45 U/L (ref 38–126)
Anion gap: 11 (ref 5–15)
BUN: 15 mg/dL (ref 8–23)
CO2: 20 mmol/L — ABNORMAL LOW (ref 22–32)
Calcium: 9.3 mg/dL (ref 8.9–10.3)
Chloride: 109 mmol/L (ref 98–111)
Creatinine, Ser: 0.94 mg/dL (ref 0.61–1.24)
GFR calc Af Amer: >60 mL/min (ref >60)
GFR calc non Af Amer: >60 mL/min (ref >60)
Glucose, Bld: 139 mg/dL — ABNORMAL HIGH (ref 70–99)
Potassium: 4.1 mmol/L (ref 3.5–5.1)
Sodium: 140 mmol/L (ref 135–145)
Total Bilirubin: 0.5 mg/dL (ref 0.3–1.2)
Total Protein: 6.3 g/dL — ABNORMAL LOW (ref 6.5–8.1)

## 2019-10-07 LAB — CBC
HCT: 43.7 % (ref 39.0–52.0)
Hemoglobin: 14.2 g/dL (ref 13.0–17.0)
MCH: 33.3 pg (ref 26.0–34.0)
MCHC: 32.5 g/dL (ref 30.0–36.0)
MCV: 102.3 fL — ABNORMAL HIGH (ref 80.0–100.0)
Platelets: 279 10*3/uL (ref 150–400)
RBC: 4.27 MIL/uL (ref 4.22–5.81)
RDW: 13.4 % (ref 11.5–15.5)
WBC: 11.5 10*3/uL — ABNORMAL HIGH (ref 4.0–10.5)
nRBC: 0 % (ref 0.0–0.2)

## 2019-10-07 LAB — URINALYSIS, ROUTINE W REFLEX MICROSCOPIC
Bacteria, UA: NONE SEEN
Bilirubin Urine: NEGATIVE
Glucose, UA: >=500 mg/dL — AB
Ketones, ur: NEGATIVE mg/dL
Leukocytes,Ua: NEGATIVE
Nitrite: NEGATIVE
Protein, ur: NEGATIVE mg/dL
Specific Gravity, Urine: 1.015 (ref 1.005–1.030)
pH: 7 (ref 5.0–8.0)

## 2019-10-07 LAB — APTT: aPTT: 28 seconds (ref 24–36)

## 2019-10-07 LAB — TYPE AND SCREEN
ABO/RH(D): A POS
Antibody Screen: NEGATIVE

## 2019-10-07 LAB — PROTIME-INR
INR: 1.1 (ref 0.8–1.2)
Prothrombin Time: 13.9 seconds (ref 11.4–15.2)

## 2019-10-07 LAB — GLUCOSE, CAPILLARY
Glucose-Capillary: 150 mg/dL — ABNORMAL HIGH (ref 70–99)
Glucose-Capillary: 103 mg/dL — ABNORMAL HIGH (ref 70–99)
Glucose-Capillary: 156 mg/dL — ABNORMAL HIGH (ref 70–99)

## 2019-10-07 SURGERY — ENDARTERECTOMY, CAROTID
Anesthesia: General | Laterality: Right

## 2019-10-07 MED ORDER — LACTATED RINGERS IV SOLN: INTRAVENOUS | Status: DC

## 2019-10-07 MED ORDER — SODIUM CHLORIDE 0.9 % IV SOLN
0.0125 ug/kg/min | INTRAVENOUS | Status: DC
  Administered 2019-10-07: .1 ug/kg/min via INTRAVENOUS
  Filled 2019-10-07 (×2): qty 2000

## 2019-10-07 MED ORDER — OXYCODONE-ACETAMINOPHEN 5-325 MG PO TABS: 1.0000 | ORAL_TABLET | ORAL | Status: DC | PRN

## 2019-10-07 MED ORDER — LIDOCAINE 2% (20 MG/ML) 5 ML SYRINGE
INTRAMUSCULAR | Status: DC | PRN
  Administered 2019-10-07: 60 mg via INTRAVENOUS

## 2019-10-07 MED ORDER — ROSUVASTATIN CALCIUM 20 MG PO TABS
20.0000 mg | ORAL_TABLET | Freq: Every morning | ORAL | Status: DC
  Administered 2019-10-08: 08:00:00 20 mg via ORAL
  Filled 2019-10-07: qty 1

## 2019-10-07 MED ORDER — INSULIN ASPART 100 UNIT/ML ~~LOC~~ SOLN
0.0000 [IU] | Freq: Three times a day (TID) | SUBCUTANEOUS | Status: DC
  Administered 2019-10-08: 06:00:00 2 [IU] via SUBCUTANEOUS

## 2019-10-07 MED ORDER — FENTANYL CITRATE (PF) 250 MCG/5ML IJ SOLN
INTRAMUSCULAR | Status: AC
  Filled 2019-10-07: qty 5

## 2019-10-07 MED ORDER — FENTANYL CITRATE (PF) 100 MCG/2ML IJ SOLN: 25.0000 ug | INTRAMUSCULAR | Status: DC | PRN

## 2019-10-07 MED ORDER — LACTATED RINGERS IV SOLN: INTRAVENOUS | Status: DC | PRN

## 2019-10-07 MED ORDER — METOPROLOL TARTRATE 12.5 MG HALF TABLET
25.0000 mg | ORAL_TABLET | Freq: Once | ORAL | Status: AC
  Administered 2019-10-07: 13:00:00 25 mg via ORAL
  Filled 2019-10-07: qty 2

## 2019-10-07 MED ORDER — CLEVIDIPINE BUTYRATE 0.5 MG/ML IV EMUL
INTRAVENOUS | Status: AC
  Filled 2019-10-07: qty 50

## 2019-10-07 MED ORDER — SODIUM CHLORIDE 0.9 % IV SOLN: INTRAVENOUS | Status: DC

## 2019-10-07 MED ORDER — PROPOFOL 1000 MG/100ML IV EMUL
INTRAVENOUS | Status: AC
  Filled 2019-10-07: qty 100

## 2019-10-07 MED ORDER — CEFAZOLIN SODIUM-DEXTROSE 2-4 GM/100ML-% IV SOLN
2.0000 g | Freq: Three times a day (TID) | INTRAVENOUS | Status: AC
  Administered 2019-10-07 – 2019-10-08 (×2): 2 g via INTRAVENOUS
  Filled 2019-10-07 (×2): qty 100

## 2019-10-07 MED ORDER — PHENYLEPHRINE 40 MCG/ML (10ML) SYRINGE FOR IV PUSH (FOR BLOOD PRESSURE SUPPORT)
PREFILLED_SYRINGE | INTRAVENOUS | Status: AC
  Filled 2019-10-07: qty 20

## 2019-10-07 MED ORDER — LABETALOL HCL 5 MG/ML IV SOLN: 10.0000 mg | INTRAVENOUS | Status: DC | PRN

## 2019-10-07 MED ORDER — SODIUM CHLORIDE 0.9 % IV SOLN
INTRAVENOUS | Status: AC
  Filled 2019-10-07: qty 1.2

## 2019-10-07 MED ORDER — ALUM & MAG HYDROXIDE-SIMETH 200-200-20 MG/5ML PO SUSP: 15.0000 mL | ORAL | Status: DC | PRN

## 2019-10-07 MED ORDER — ACETAMINOPHEN 325 MG PO TABS: 325.0000 mg | ORAL_TABLET | ORAL | Status: DC | PRN

## 2019-10-07 MED ORDER — ONDANSETRON HCL 4 MG/2ML IJ SOLN: 4.0000 mg | Freq: Once | INTRAMUSCULAR | Status: DC | PRN

## 2019-10-07 MED ORDER — 0.9 % SODIUM CHLORIDE (POUR BTL) OPTIME
TOPICAL | Status: DC | PRN
  Administered 2019-10-07: 2000 mL

## 2019-10-07 MED ORDER — LIDOCAINE HCL (PF) 1 % IJ SOLN
INTRAMUSCULAR | Status: AC
  Filled 2019-10-07: qty 30

## 2019-10-07 MED ORDER — SODIUM CHLORIDE 0.9 % IV SOLN: 500.0000 mL | Freq: Once | INTRAVENOUS | Status: DC | PRN

## 2019-10-07 MED ORDER — PHENOL 1.4 % MT LIQD: 1.0000 | OROMUCOSAL | Status: DC | PRN

## 2019-10-07 MED ORDER — LEVOTHYROXINE SODIUM 50 MCG PO TABS
50.0000 ug | ORAL_TABLET | Freq: Once | ORAL | Status: AC
  Administered 2019-10-07: 13:00:00 50 ug via ORAL
  Filled 2019-10-07: qty 1

## 2019-10-07 MED ORDER — CEFAZOLIN SODIUM-DEXTROSE 2-4 GM/100ML-% IV SOLN
INTRAVENOUS | Status: AC
  Filled 2019-10-07: qty 100

## 2019-10-07 MED ORDER — INSULIN GLARGINE 100 UNIT/ML SOLOSTAR PEN: 15.0000 [IU] | PEN_INJECTOR | Freq: Every day | SUBCUTANEOUS | Status: DC

## 2019-10-07 MED ORDER — SODIUM CHLORIDE 0.9 % IV SOLN
INTRAVENOUS | Status: DC | PRN
  Administered 2019-10-07: 500 mL

## 2019-10-07 MED ORDER — POTASSIUM CHLORIDE CRYS ER 20 MEQ PO TBCR: 20.0000 meq | EXTENDED_RELEASE_TABLET | Freq: Every day | ORAL | Status: DC | PRN

## 2019-10-07 MED ORDER — METOPROLOL TARTRATE 5 MG/5ML IV SOLN: 2.0000 mg | INTRAVENOUS | Status: DC | PRN

## 2019-10-07 MED ORDER — CLOPIDOGREL BISULFATE 75 MG PO TABS
75.0000 mg | ORAL_TABLET | Freq: Every day | ORAL | Status: DC
  Administered 2019-10-08: 75 mg via ORAL
  Filled 2019-10-07: qty 1

## 2019-10-07 MED ORDER — PHENYLEPHRINE HCL-NACL 10-0.9 MG/250ML-% IV SOLN
INTRAVENOUS | Status: DC | PRN
  Administered 2019-10-07: 30 ug/min via INTRAVENOUS

## 2019-10-07 MED ORDER — EPHEDRINE SULFATE-NACL 50-0.9 MG/10ML-% IV SOSY
PREFILLED_SYRINGE | INTRAVENOUS | Status: DC | PRN
  Administered 2019-10-07: 5 mg via INTRAVENOUS

## 2019-10-07 MED ORDER — CHLORHEXIDINE GLUCONATE 4 % EX LIQD: 60.0000 mL | Freq: Once | CUTANEOUS | Status: DC

## 2019-10-07 MED ORDER — OXYCODONE HCL 5 MG PO TABS: 5.0000 mg | ORAL_TABLET | Freq: Once | ORAL | Status: DC | PRN

## 2019-10-07 MED ORDER — MAGNESIUM SULFATE 2 GM/50ML IV SOLN: 2.0000 g | Freq: Every day | INTRAVENOUS | Status: DC | PRN

## 2019-10-07 MED ORDER — DOCUSATE SODIUM 100 MG PO CAPS
100.0000 mg | ORAL_CAPSULE | Freq: Every day | ORAL | Status: DC
  Administered 2019-10-08: 08:00:00 100 mg via ORAL
  Filled 2019-10-07: qty 1

## 2019-10-07 MED ORDER — HEPARIN SODIUM (PORCINE) 1000 UNIT/ML IJ SOLN
INTRAMUSCULAR | Status: DC | PRN
  Administered 2019-10-07: 2000 [IU] via INTRAVENOUS
  Administered 2019-10-07: 8000 [IU] via INTRAVENOUS

## 2019-10-07 MED ORDER — PROTAMINE SULFATE 10 MG/ML IV SOLN
INTRAVENOUS | Status: DC | PRN
  Administered 2019-10-07: 50 mg via INTRAVENOUS

## 2019-10-07 MED ORDER — ONDANSETRON HCL 4 MG/2ML IJ SOLN: 4.0000 mg | Freq: Four times a day (QID) | INTRAMUSCULAR | Status: DC | PRN

## 2019-10-07 MED ORDER — INSULIN GLARGINE 100 UNIT/ML ~~LOC~~ SOLN
15.0000 [IU] | Freq: Every day | SUBCUTANEOUS | Status: DC
  Administered 2019-10-07: 21:00:00 15 [IU] via SUBCUTANEOUS
  Filled 2019-10-07 (×2): qty 0.15

## 2019-10-07 MED ORDER — ACETAMINOPHEN 650 MG RE SUPP: 325.0000 mg | RECTAL | Status: DC | PRN

## 2019-10-07 MED ORDER — PANTOPRAZOLE SODIUM 40 MG PO TBEC
40.0000 mg | DELAYED_RELEASE_TABLET | Freq: Every day | ORAL | Status: DC
  Administered 2019-10-08: 40 mg via ORAL
  Filled 2019-10-07: qty 1

## 2019-10-07 MED ORDER — CEFAZOLIN SODIUM-DEXTROSE 2-4 GM/100ML-% IV SOLN
2.0000 g | INTRAVENOUS | Status: AC
  Administered 2019-10-07: 2 g via INTRAVENOUS

## 2019-10-07 MED ORDER — ROCURONIUM BROMIDE 100 MG/10ML IV SOLN
INTRAVENOUS | Status: DC | PRN
  Administered 2019-10-07: 10 mg via INTRAVENOUS
  Administered 2019-10-07: 60 mg via INTRAVENOUS

## 2019-10-07 MED ORDER — PROPOFOL 10 MG/ML IV BOLUS
INTRAVENOUS | Status: DC | PRN
  Administered 2019-10-07: 40 mg via INTRAVENOUS
  Administered 2019-10-07: 90 mg via INTRAVENOUS

## 2019-10-07 MED ORDER — ONDANSETRON HCL 4 MG/2ML IJ SOLN
INTRAMUSCULAR | Status: DC | PRN
  Administered 2019-10-07: 4 mg via INTRAVENOUS

## 2019-10-07 MED ORDER — GUAIFENESIN-DM 100-10 MG/5ML PO SYRP: 15.0000 mL | ORAL_SOLUTION | ORAL | Status: DC | PRN

## 2019-10-07 MED ORDER — PROPOFOL 500 MG/50ML IV EMUL
INTRAVENOUS | Status: DC | PRN
  Administered 2019-10-07: 20 ug/kg/min via INTRAVENOUS

## 2019-10-07 MED ORDER — FENTANYL CITRATE (PF) 250 MCG/5ML IJ SOLN
INTRAMUSCULAR | Status: DC | PRN
  Administered 2019-10-07: 50 ug via INTRAVENOUS

## 2019-10-07 MED ORDER — SODIUM CHLORIDE 0.9 % IV SOLN: INTRAVENOUS | Status: DC | PRN

## 2019-10-07 MED ORDER — OXYCODONE HCL 5 MG/5ML PO SOLN: 5.0000 mg | Freq: Once | ORAL | Status: DC | PRN

## 2019-10-07 MED ORDER — HYDRALAZINE HCL 20 MG/ML IJ SOLN: 5.0000 mg | INTRAMUSCULAR | Status: DC | PRN

## 2019-10-07 MED ORDER — SUGAMMADEX SODIUM 200 MG/2ML IV SOLN
INTRAVENOUS | Status: DC | PRN
  Administered 2019-10-07: 200 mg via INTRAVENOUS

## 2019-10-07 MED ORDER — EPHEDRINE 5 MG/ML INJ
INTRAVENOUS | Status: AC
  Filled 2019-10-07: qty 10

## 2019-10-07 MED ORDER — PHENYLEPHRINE 40 MCG/ML (10ML) SYRINGE FOR IV PUSH (FOR BLOOD PRESSURE SUPPORT)
PREFILLED_SYRINGE | INTRAVENOUS | Status: AC
  Filled 2019-10-07: qty 10

## 2019-10-07 MED ORDER — METOPROLOL TARTRATE 25 MG PO TABS
25.0000 mg | ORAL_TABLET | Freq: Two times a day (BID) | ORAL | Status: DC
  Administered 2019-10-08: 08:00:00 25 mg via ORAL
  Filled 2019-10-07: qty 1

## 2019-10-07 MED ORDER — NITROGLYCERIN 0.4 MG SL SUBL: 0.4000 mg | SUBLINGUAL_TABLET | SUBLINGUAL | Status: DC | PRN

## 2019-10-07 MED ORDER — MORPHINE SULFATE (PF) 2 MG/ML IV SOLN: 2.0000 mg | INTRAVENOUS | Status: DC | PRN

## 2019-10-07 SURGICAL SUPPLY — 44 items
ADH SKN CLS APL DERMABOND .7 (GAUZE/BANDAGES/DRESSINGS) ×1
CANISTER SUCT 3000ML PPV (MISCELLANEOUS) ×3 IMPLANT
CATH ROBINSON RED A/P 18FR (CATHETERS) ×3 IMPLANT
CLIP VESOCCLUDE MED 24/CT (CLIP) ×3 IMPLANT
CLIP VESOCCLUDE SM WIDE 24/CT (CLIP) ×3 IMPLANT
COVER TRANSDUCER ULTRASND GEL (DRAPE) ×3 IMPLANT
COVER WAND RF STERILE (DRAPES) ×3 IMPLANT
DERMABOND ADVANCED (GAUZE/BANDAGES/DRESSINGS) ×2
DERMABOND ADVANCED .7 DNX12 (GAUZE/BANDAGES/DRESSINGS) ×1 IMPLANT
ELECT REM PT RETURN 9FT ADLT (ELECTROSURGICAL) ×3
ELECTRODE REM PT RTRN 9FT ADLT (ELECTROSURGICAL) ×1 IMPLANT
GLOVE BIO SURGEON STRL SZ 6.5 (GLOVE) ×1 IMPLANT
GLOVE BIO SURGEON STRL SZ7.5 (GLOVE) ×7 IMPLANT
GLOVE BIO SURGEONS STRL SZ 6.5 (GLOVE) ×1
GLOVE BIOGEL PI IND STRL 6.5 (GLOVE) IMPLANT
GLOVE BIOGEL PI IND STRL 8 (GLOVE) ×1 IMPLANT
GLOVE BIOGEL PI INDICATOR 6.5 (GLOVE) ×2
GLOVE BIOGEL PI INDICATOR 8 (GLOVE) ×4
GLOVE ECLIPSE 6.5 STRL STRAW (GLOVE) ×4 IMPLANT
GLOVE SS BIOGEL STRL SZ 7.5 (GLOVE) IMPLANT
GLOVE SUPERSENSE BIOGEL SZ 7.5 (GLOVE) ×2
GOWN STRL REUS W/ TWL LRG LVL3 (GOWN DISPOSABLE) ×2 IMPLANT
GOWN STRL REUS W/ TWL XL LVL3 (GOWN DISPOSABLE) ×2 IMPLANT
GOWN STRL REUS W/TWL LRG LVL3 (GOWN DISPOSABLE) ×12
GOWN STRL REUS W/TWL XL LVL3 (GOWN DISPOSABLE) ×6
HEMOSTAT SNOW SURGICEL 2X4 (HEMOSTASIS) ×2 IMPLANT
KIT BASIN OR (CUSTOM PROCEDURE TRAY) ×3 IMPLANT
KIT TURNOVER KIT B (KITS) ×3 IMPLANT
LOOP VESSEL MINI RED (MISCELLANEOUS) ×2 IMPLANT
NS IRRIG 1000ML POUR BTL (IV SOLUTION) ×9 IMPLANT
PACK CAROTID (CUSTOM PROCEDURE TRAY) ×3 IMPLANT
PAD ARMBOARD 7.5X6 YLW CONV (MISCELLANEOUS) ×6 IMPLANT
PATCH VASC XENOSURE 1CMX6CM (Vascular Products) ×3 IMPLANT
PATCH VASC XENOSURE 1X6 (Vascular Products) IMPLANT
POSITIONER HEAD DONUT 9IN (MISCELLANEOUS) ×3 IMPLANT
SHUNT CAROTID BYPASS 10 (VASCULAR PRODUCTS) ×2 IMPLANT
SUT MNCRL AB 4-0 PS2 18 (SUTURE) ×3 IMPLANT
SUT PROLENE 5 0 C 1 24 (SUTURE) ×3 IMPLANT
SUT PROLENE 6 0 BV (SUTURE) ×7 IMPLANT
SUT PROLENE 7 0 BV 1 (SUTURE) ×4 IMPLANT
SUT VIC AB 3-0 SH 27 (SUTURE) ×3
SUT VIC AB 3-0 SH 27X BRD (SUTURE) ×1 IMPLANT
TOWEL GREEN STERILE (TOWEL DISPOSABLE) ×3 IMPLANT
WATER STERILE IRR 1000ML POUR (IV SOLUTION) ×3 IMPLANT

## 2019-10-07 NOTE — Anesthesia Procedure Notes (Signed)
Arterial Line Insertion Start/End1/03/2020 1:00 PM Performed by: Audry Pili, MD, Janene Harvey, CRNA, CRNA  Preanesthetic checklist: patient identified Lidocaine 1% used for infiltration Right, radial was placed Catheter size: 20 G Hand hygiene performed , maximum sterile barriers used  and Seldinger technique used Allen's test indicative of satisfactory collateral circulation Attempts: 2 Procedure performed without using ultrasound guided technique. Following insertion, Biopatch and dressing applied. Post procedure assessment: unchanged  Patient tolerated the procedure well with no immediate complications.

## 2019-10-07 NOTE — Transfer of Care (Addendum)
Immediate Anesthesia Transfer of Care Note  Patient: [name]  Procedure(s) Performed: ENDARTERECTOMY CAROTID RIGHT (Right )  Patient Location: PACU  Anesthesia Type:General  Level of Consciousness: awake  Airway & Oxygen Therapy: Patient Spontanous Breathing and Patient connected to face mask oxygen  Post-op Assessment: Report given to RN and Post -op Vital signs reviewed and stable  Post vital signs: Reviewed  Last Vitals:  Vitals Value Taken Time  BP 135/58 10/07/19 1553  Temp    Pulse 62 10/07/19 1559  Resp 14 10/07/19 1559  SpO2 98 % 10/07/19 1559  Vitals shown include unvalidated device data.  Last Pain:  Vitals:   10/07/19 1046  PainSc: 0-No pain         Complications: No apparent anesthesia complications. Pt moving extremities x4, Dr. Carlis Abbott assessed pt in PACU. Goal sbp 120-160.

## 2019-10-07 NOTE — Op Note (Signed)
OPERATIVE NOTE  PROCEDURE:   1.  right carotid endarterectomy with bovine patch angioplasty 2.  right intraoperative carotid ultrasound  PRE-OPERATIVE DIAGNOSIS: right symptomatic carotid stenosis (>80%)  POST-OPERATIVE DIAGNOSIS: same as above   SURGEON: Marty Heck, MD  ASSISTANT(S): Curt Jews, MD and Arlee Muslim, [place]  ANESTHESIA: general  ESTIMATED BLOOD LOSS: <75 cc  FINDING(S): 1.  Right high grade calcified carotid plaque. 2.  Continuous Doppler audible flow signatures are appropriate for each carotid artery after endarterectomy. 3.  No evidence of intimal flap visualized on transverse or longitudinal ultrasonography.   SPECIMEN(S):  Carotid plaque (sent to Pathology)  INDICATIONS:   [name] is a 86 y.o. male who presents with right symptomatic carotid stenosis >80%.  He is not a good candidate for stent or TCAR given heavy calcification.  I discussed with the patient the risks, benefits, and alternatives to carotid endarterectomy.  I discussed the procedural details of carotid endarterectomy with the patient.  The patient is aware that the risks of carotid endarterectomy include but are not limited to: bleeding, infection, stroke, myocardial infarction, death, cranial nerve injuries both temporary and permanent, neck hematoma, possible airway compromise, labile blood pressure post-operatively, cerebral hyperperfusion syndrome, and possible need for additional interventions in the future. The patient is aware of the risks and agrees to proceed forward with the procedure.  DESCRIPTION: After full informed written consent was obtained from the patient, the patient was brought back to the operating room and placed supine upon the operating table.  Prior to induction, the patient received IV antibiotics.  After obtaining adequate anesthesia, the patient was placed into semi-Fowler position with a shoulder roll in place and the patient's neck slightly hyperextended  and rotated away from the surgical site.  The patient was prepped in the standard fashion for a right carotid endarterectomy.  I made an incision anterior to the sternocleidomastoid muscle and dissected down through the subcutaneous tissue.  The platysma was opened with electrocautery.  I then used Bovie cautery and blunt dissection to dissect through the underlying platysma and to mobilize the anterior border of the sternocleidomastoid as well as the internal jugular vein laterally.  The facial vein was ligated with 3-0 silk and surgical clips and divided.  Several other branches off the internal jugular vein were also ligated between 4-0silk ties and divided. After identifying the carotid artery I used Metzenbaum scissors to bluntly dissect the common carotid artery and then controlled this with a umbilical tape.  At this point in time the patient was given 100 units/kg of IV heparin and we checked an ACT to ensure it was greater than 250.  I then carried my dissection cephalad and mobilized the external carotid artery and superior thyroid artery and controlled each of these with a vessel loop.  I then dissected out the internal carotid artery well past the distal plaque.  The internal carotid artery was then controlled with a umbilical tape as well. I was careful to identify the vagus nerve between the internal jugular and common carotid and this was presereved.  I was also careful to identify and preserve the hypoglossal nerve and this was preserved.    Once our ACT was confirmed, I proceeded by clamping the internal carotid artery with a angled bulldog clamp first.  The proximal common carotid artery was controlled with a angled debakey clamp.  The external carotid was controlled with a vessel loop.  I subsequently opened the common carotid artery with an  11 blade scalpel in longitudinal fashion and extended the arteriotomy with Potts scissors onto the ICA past the distal plaque.  I then used a Investment banker, corporate and performed a endarterectomy starting in the common carotid artery.  The external carotid artery was endarterectomized with an eversion technique and I was careful to feather the distal ICA plaque.  The specimen was passed off the field.  At this point a 10 [place] shunt was brought to the field and then initially placed distally into the ICA after removing the clamp. The shunt was back bleed with good flow.  I then placed the proximal end of the shunt in the common carotid artery and controlled this with a Rummel tourniquet.  The endarterectomy site was then flushed with heparinized saline and I was careful to ensure there were no flaps in the endarterectomy site.  I was not happy with the distal flap so I opened the internal carotid more distally and feathered more of the plaque off.  I placed one 7-0 prolene to tack the distal flap. I then brought a bovine carotid patch on the field and this was sewn in place with a running anastomosis using a 6-0 Prolene distally and a 5-0 proximal.  The bovine patch was trimmed accordingly.  The shunt was removed just before completion of the patch.  The artery was flushed antegrade and retrograde prior to completion of the patch.  Once the patch was complete, I flushed up the external carotid artery first prior to releasing the internal carotid artery clamp.  An intraoperative duplex was performed that showed no evidence of any flaps.  I also placed a doppler on the internal and external carotid arteries with good flow.  Once I was happy with the intraoperative ultrasound and doppler the patient was given protamine for reversal.  I used surgicel snow to get hemostasis around the patch.  Ultimately the platysma was closed in running fashion with 3-0 Vicryl.  The skin was closed with a running 4-0 Monocryl.  Dermabond was applied with a dry sterile dressing.  The patient was awakened from anesthesia with no new neurological deficit and taken to PACU in stable  condition.    COMPLICATIONS: None  CONDITION: Stable  Marty Heck, MD [facility] Office: [phone] Pager: [phone]  10/07/2019, 3:28 PM

## 2019-10-07 NOTE — H&P (Signed)
History and Physical Interval Note:  10/07/2019 1:16 PM  [name]  has presented today for surgery, with the diagnosis of RIGHT CAROTID ARTERY STENOSIS.  The various methods of treatment have been discussed with the patient and family. After consideration of risks, benefits and other options for treatment, the patient has consented to  Procedure(s): ENDARTERECTOMY CAROTID RIGHT (Right) as a surgical intervention.  The patient's history has been reviewed, patient examined, no change in status, stable for surgery.  I have reviewed the patient's chart and labs.  Questions were answered to the patient's satisfaction.     R CEA.  Symptomatic 80% carotid stenosis.  I had a long discussion with Dr. Leonie Man as well as the patient's daughter and patient is very functional living independently driving and we think he would benefit from carotid intervention.  [name]  Reason for Consult: Symptomatic right carotid stenosis  Referring Physician: Neurology and hospitalist  MRN #: [id number]  History of Present Illness: This is a 86 y.o. male with history of diabetes, coronary artery disease status post previous PCI, A. fib not on anticoagulation, hypertension, hyperlipidemia, previous stroke about 10 years ago that vascular surgery has been consulted for symptomatic right carotid stenosis. Patient states on Monday he noticed some blurry right eye vision. Ultimately presented to his PCP who obtained an MRI. MRI showed multiple foci of acute nonhemorrhagic infarcts within the right MCA distribution. Patient ultimately was referred to the ED for further evaluation. CTA neck then showed significant plaque in the right ICA origin causing at least 80% stenosis with no flow-limiting stenosis of the left ICA.  Patient states he lives independently in a care facility. States he is able to drive a car. States he walks with a walker due to left knee pain. States he had a  stroke about 10 years ago with some left hand tingling that resolved. Denies any previous head or neck surgery. Was on Plavix prior to his stroke but now is on Eliquis. Apparently anticoagulation held in past due to GIB and duodenal ulcer. Feels he is back at his neuro baseline at this time.      Past Medical History:  Diagnosis Date  . Anemia   . Arthritis   . CAD S/P percutaneous coronary angioplasty    a. 11/2016: anterior STEMI s/p DES to LAD.   . Diabetes mellitus without complication ([place])    Diet controlled  . GERD (gastroesophageal reflux disease)   . GI bleed    10/2013: 2/2 duodenal ulcer. ASA/plavix discontinued.   . Hyperlipemia   . Hypertension   . PAF (paroxysmal atrial fibrillation) ([place])    a. noted during admission in 10/2013 for acute GI bleed. No recurrunce since. Monitor in 2014 with only PAC/PVCs  . ST elevation myocardial infarction (STEMI) involving left anterior descending (LAD) coronary artery with complication ([place]) 99991111   - 99% subtotal occlusion of LAD treated with DES stent.  . Stroke [facility])    a. ichemic CVA in 2012.         Past Surgical History:  Procedure Laterality Date  . CORONARY STENT INTERVENTION N/A 12/11/2016   Procedure: Coronary Stent Intervention-Mid LAD; Surgeon: Leonie Man, MD; Location: [facility]; Service: Cardiovascular: mLAD 99%-0% Synergy DES 2.75 x 16 (3.0 mm)  . ESOPHAGOGASTRODUODENOSCOPY N/A 09/23/2013   Procedure: ESOPHAGOGASTRODUODENOSCOPY (EGD); Surgeon: Lafayette Dragon, MD; Location: [facility] ENDOSCOPY; Service: Endoscopy; Laterality: N/A;  .  ESOPHAGOGASTRODUODENOSCOPY N/A 09/25/2013   Procedure: ESOPHAGOGASTRODUODENOSCOPY (EGD); Surgeon: Beryle Beams, MD; Location: [facility] ENDOSCOPY; Service: Endoscopy; Laterality: N/A;  . ESOPHAGOGASTRODUODENOSCOPY N/A 12/18/2013   Procedure: ESOPHAGOGASTRODUODENOSCOPY (EGD); Surgeon: Beryle Beams, MD; Location: [facility] END[facility]; Service: Endoscopy; Laterality: N/A;  . LEFT HEART CATH AND  CORONARY ANGIOGRAPHY N/A 12/11/2016   Procedure: Left Heart Cath and Coronary Angiography; Surgeon: Leonie Man, MD; Location: [facility]; Service: Cardiovascular: LM 45%. mLAD 99% (PCI), dLAD 40% & 50%, ostD1 - 50%. Ost-prox Cx 60%, OstOM1 60% (Med Rx). Anteroapical HK - EF 50-55%. Normal LVEDP  . TOTAL HIP ARTHROPLASTY Right 04/28/2013   Procedure: RIGHT TOTAL HIP ARTHROPLASTY ANTERIOR APPROACH; Surgeon: Mauri Pole, MD; Location: [facility] ORS; Service: Orthopedics; Laterality: Right;  . TRANSTHORACIC ECHOCARDIOGRAM  12/12/2016   Status post anterior STEMI: Normal LV size with normal function. EF 60 deficits 5% with no regional wall motion abnormality. GR 2 DD. Mild LA dilation.        Allergies  Allergen Reactions  . Adhesive [Tape] Rash    Zoll pads adhesive          Prior to Admission medications   Medication Sig Start Date End Date Taking? Authorizing Provider  clopidogrel (PLAVIX) 75 MG tablet Take 1 tablet (75 mg total) by mouth daily with breakfast. 09/21/19  Yes Leonie Man, MD  ergocalciferol (VITAMIN D2) 50000 UNITS capsule Take 50,000 Units by mouth once a week. Patient takes on Fridays   Yes [provider]  glimepiride (AMARYL) 2 MG tablet Take 1 tablet by mouth daily. 06/03/18  Yes [provider]  LANTUS SOLOSTAR 100 UNIT/ML Solostar Pen Inject 15 Units into the skin daily.  04/23/18  Yes [provider]  levothyroxine (SYNTHROID, LEVOTHROID) 50 MCG tablet Take 50 mcg by mouth daily before breakfast.   Yes [provider]  metoprolol tartrate (LOPRESSOR) 25 MG tablet Take 1 tablet (25 mg total) by mouth 2 (two) times daily. 04/10/19  Yes Leonie Man, MD  nitroGLYCERIN (NITROSTAT) 0.4 MG SL tablet Place 1 tablet (0.4 mg total) under the tongue every 5 (five) minutes x 3 doses as needed for chest pain. 12/13/16  Yes Eileen Stanford, PA-C  pantoprazole (PROTONIX) 40 MG tablet TAKE 1 TABLET ONCE DAILY.  Patient taking  differently: Take 40 mg by mouth daily.  12/10/18  Yes Leonie Man, MD  rosuvastatin (CRESTOR) 20 MG tablet Take 20 mg by mouth every morning.    Yes [provider]  Empagliflozin-linaGLIPtin (GLYXAMBI) 10-5 MG TABS Take 1 tablet by mouth daily.     [provider]   Social History        Socioeconomic History  . Marital status: Widowed    Spouse name: Not on file  . Number of children: Not on file  . Years of education: Not on file  . Highest education level: Not on file  Occupational History  . Not on file  Tobacco Use  . Smoking status: Never Smoker  . Smokeless tobacco: Never Used  Substance and Sexual Activity  . Alcohol use: Yes    Alcohol/week: 3.0 standard drinks    Types: 3 Shots of liquor per week    Comment: wine or vodka   . Drug use: No  . Sexual activity: Not on file  Other Topics Concern  . Not on file  Social History Narrative  . Not on file   Social Determinants of Health      Financial Resource Strain:   .  Difficulty of Paying Living Expenses: Not on file  Food Insecurity:   . Worried About Charity fundraiser in the Last Year: Not on file  . Ran Out of Food in the Last Year: Not on file  Transportation Needs:   . Lack of Transportation (Medical): Not on file  . Lack of Transportation (Non-Medical): Not on file  Physical Activity:   . Days of Exercise per Week: Not on file  . Minutes of Exercise per Session: Not on file  Stress:   . Feeling of Stress : Not on file  Social Connections:   . Frequency of Communication with Friends and Family: Not on file  . Frequency of Social Gatherings with Friends and Family: Not on file  . Attends Religious Services: Not on file  . Active Member of Clubs or Organizations: Not on file  . Attends Archivist Meetings: Not on file  . Marital Status: Not on file  Intimate Partner Violence:   . Fear of Current or Ex-Partner: Not on file  . Emotionally Abused: Not on file  .  Physically Abused: Not on file  . Sexually Abused: Not on file   History reviewed. No pertinent family history.  ROS: [x]  Positive [ ]  Negative [ ]  All sytems reviewed and are negative  Cardiovascular:  []  chest pain/pressure  []  palpitations  []  SOB lying flat  []  DOE  []  pain in legs while walking  []  pain in legs at rest  []  pain in legs at night  []  non-healing ulcers  []  hx of DVT  []  swelling in legs  Pulmonary:  []  productive cough  []  asthma/wheezing  []  home O2  Neurologic:  []  weakness in []  arms []  legs  []  numbness in []  arms []  legs  []  hx of CVA []  mini stroke  [] difficulty speaking or slurred speech  []  temporary loss of vision in one eye  []  dizziness  Hematologic:  []  hx of cancer  []  bleeding problems  []  problems with blood clotting easily  Endocrine:  []  diabetes []  thyroid disease  GI  []  vomiting blood  []  blood in stool  GU:  []  CKD/renal failure []  HD--[]  M/W/F or []  T/T/S  []  burning with urination  []  blood in urine  Psychiatric:  []  anxiety  []  depression  Musculoskeletal:  []  arthritis  []  joint pain  Integumentary:  []  rashes []  ulcers  Constitutional:  []  fever []  chills  Physical Examination      Vitals:   09/25/19 0335 09/25/19 0817  BP: 115/74 (!) 164/74  Pulse: 68 75  Resp: 15 16  Temp: 97.8 F (36.6 C) 97.7 F (36.5 C)  SpO2: 98% 100%   Body mass index is 24.97 kg/m.  General: WDWN in NAD  Gait: Not observed  HENT: WNL, normocephalic  Pulmonary: normal non-labored breathing, without Rales, rhonchi, wheezing  Cardiac: irregular, without Murmurs, rubs or gallops  Abdomen: soft, NT/ND, no masses  Vascular Exam/Pulses:  Extremities: without ischemic changes, without Gangrene , without cellulitis; without open wounds;  Musculoskeletal: no muscle wasting or atrophy  Neurologic: A&O X 3; Appropriate Affect ; SENSATION: normal; MOTOR FUNCTION: moving all extremities equally. Speech is fluent/normal. CN II-XII grossly  intact.  CBC  Labs (Brief)  BMET  Labs (Brief)                                                                            COAGS:  Recent Labs                                  Non-Invasive Vascular Imaging:  I independently reviewed his CTA neck and agree he has approximate 80% heavily calcified right ICA proximal stenosis, very thick calcium >4 mm. Left ICA also calcified with approximately 50% stenosis.  MRI brain multiple punctate nonhemorrhagic infarcts within the right MCA distribution  ASSESSMENT/PLAN: This is a 86 y.o. male that presents with suspected symptomatic high-grade stenosis of the right ICA. Initially presented with blurred vision in the right eye but MRI confirmed multiple punctate nonhemorrhagic infarct within the right MCA distribution. The right ICA has at least an 80% heavily calcified stenosis. Certainly would normally recommend carotid intervention; however, at 86 years old we need to ensure that he is functional enough to benefit from surgery. I do not think he is a candidate for TCAR (carotid stent) given the right ICA calcification that measures greater than 4 mm in thickness. His only option would be either right carotid endarterectomy versus medical management. I discussed all this in detail with the patient. Ultimately he asked that I discuss this with his daughter and I talked to her extensively on the phone today including risk of surgery including risk of perioperative stroke (1%), bleeding requiring return to the OR, infection, risk of MI, risk of anesthesia, etc. Ultimately she is coming to the hospital today and will discuss further options with her dad and I will follow up with the daughter. She does state he is very functional for 86 year old.  Stuart Heck, MD  [facility]  Office: [phone]   Pager: [place]

## 2019-10-07 NOTE — Anesthesia Procedure Notes (Signed)
Procedure Name: Intubation Date/Time: 10/07/2019 1:30 PM Performed by: Janene Harvey, CRNA Pre-anesthesia Checklist: Patient identified, Emergency Drugs available, Suction available and Patient being monitored Patient Re-evaluated:Patient Re-evaluated prior to induction Oxygen Delivery Method: Circle system utilized Preoxygenation: Pre-oxygenation with 100% oxygen Induction Type: IV induction Ventilation: Mask ventilation without difficulty Laryngoscope Size: Mac and 4 Tube type: Oral Tube size: 7.5 mm Number of attempts: 1 Airway Equipment and Method: Stylet and Oral airway Placement Confirmation: ETT inserted through vocal cords under direct vision,  positive ETCO2 and breath sounds checked- equal and bilateral Secured at: 23 cm Tube secured with: Tape Dental Injury: Teeth and Oropharynx as per pre-operative assessment

## 2019-10-08 ENCOUNTER — Encounter: Payer: Self-pay | Admitting: *Deleted

## 2019-10-08 LAB — GLUCOSE, CAPILLARY: Glucose-Capillary: 142 mg/dL — ABNORMAL HIGH (ref 70–99)

## 2019-10-08 LAB — BASIC METABOLIC PANEL
Anion gap: 11 (ref 5–15)
BUN: 13 mg/dL (ref 8–23)
CO2: 20 mmol/L — ABNORMAL LOW (ref 22–32)
Calcium: 8.7 mg/dL — ABNORMAL LOW (ref 8.9–10.3)
Chloride: 106 mmol/L (ref 98–111)
Creatinine, Ser: 0.89 mg/dL (ref 0.61–1.24)
GFR calc Af Amer: >60 mL/min (ref >60)
GFR calc non Af Amer: >60 mL/min (ref >60)
Glucose, Bld: 186 mg/dL — ABNORMAL HIGH (ref 70–99)
Potassium: 3.7 mmol/L (ref 3.5–5.1)
Sodium: 137 mmol/L (ref 135–145)

## 2019-10-08 LAB — CBC
HCT: 35 % — ABNORMAL LOW (ref 39.0–52.0)
Hemoglobin: 11.6 g/dL — ABNORMAL LOW (ref 13.0–17.0)
MCH: 33 pg (ref 26.0–34.0)
MCHC: 33.1 g/dL (ref 30.0–36.0)
MCV: 99.7 fL (ref 80.0–100.0)
Platelets: 227 10*3/uL (ref 150–400)
RBC: 3.51 MIL/uL — ABNORMAL LOW (ref 4.22–5.81)
RDW: 13.4 % (ref 11.5–15.5)
WBC: 10.6 10*3/uL — ABNORMAL HIGH (ref 4.0–10.5)
nRBC: 0 % (ref 0.0–0.2)

## 2019-10-08 LAB — POCT ACTIVATED CLOTTING TIME: Activated Clotting Time: 257 seconds

## 2019-10-08 MED ORDER — HYDROCODONE-ACETAMINOPHEN 5-325 MG PO TABS: 1.0000 | ORAL_TABLET | Freq: Three times a day (TID) | ORAL | 0 refills | Status: DC | PRN

## 2019-10-08 MED ORDER — APIXABAN 5 MG PO TABS
5.0000 mg | ORAL_TABLET | Freq: Two times a day (BID) | ORAL | Status: DC
  Administered 2019-10-08: 08:00:00 5 mg via ORAL
  Filled 2019-10-08: qty 1

## 2019-10-08 NOTE — Plan of Care (Signed)
  Problem: Education: Goal: Knowledge of General Education information will improve Description: Including pain rating scale, medication(s)/side effects and non-pharmacologic comfort measures Outcome: Completed/Met   Problem: Health Behavior/Discharge Planning: Goal: Ability to manage health-related needs will improve Outcome: Completed/Met   Problem: Clinical Measurements: Goal: Ability to maintain clinical measurements within normal limits will improve Outcome: Completed/Met Goal: Will remain free from infection Outcome: Completed/Met Goal: Diagnostic test results will improve Outcome: Completed/Met Goal: Respiratory complications will improve Outcome: Completed/Met Goal: Cardiovascular complication will be avoided Outcome: Completed/Met   Problem: Activity: Goal: Risk for activity intolerance will decrease Outcome: Completed/Met   Problem: Nutrition: Goal: Adequate nutrition will be maintained Outcome: Completed/Met   Problem: Coping: Goal: Level of anxiety will decrease Outcome: Completed/Met   Problem: Elimination: Goal: Will not experience complications related to bowel motility Outcome: Completed/Met Goal: Will not experience complications related to urinary retention Outcome: Completed/Met   Problem: Pain Managment: Goal: General experience of comfort will improve Outcome: Completed/Met   Problem: Safety: Goal: Ability to remain free from injury will improve Outcome: Completed/Met   Problem: Skin Integrity: Goal: Risk for impaired skin integrity will decrease Outcome: Completed/Met   Problem: Education: Goal: Knowledge of discharge needs will improve Outcome: Completed/Met   Problem: Clinical Measurements: Goal: Postoperative complications will be avoided or minimized Outcome: Completed/Met   Problem: Respiratory: Goal: Ability to achieve and maintain a regular respiratory rate will improve Outcome: Completed/Met   Problem: Skin Integrity: Goal:  Demonstration of wound healing without infection will improve Outcome: Completed/Met

## 2019-10-08 NOTE — Progress Notes (Signed)
Notified daugther [name] regarding discharge instructions and explained discharge instruction over the phone, mentioned that prescription sent electronically to pharmacy. Also went over discharge instructions with Mr. [name]. Patient to return to his home by car with daugther. Awaiting her arrival.

## 2019-10-08 NOTE — Plan of Care (Signed)
  Problem: Education: Goal: Knowledge of General Education information will improve Description: Including pain rating scale, medication(s)/side effects and non-pharmacologic comfort measures Outcome: Completed/Met   Problem: Health Behavior/Discharge Planning: Goal: Ability to manage health-related needs will improve Outcome: Completed/Met   Problem: Clinical Measurements: Goal: Ability to maintain clinical measurements within normal limits will improve Outcome: Completed/Met Goal: Will remain free from infection Outcome: Completed/Met Goal: Diagnostic test results will improve Outcome: Completed/Met Goal: Respiratory complications will improve Outcome: Completed/Met Goal: Cardiovascular complication will be avoided Outcome: Completed/Met   Problem: Activity: Goal: Risk for activity intolerance will decrease Outcome: Completed/Met   Problem: Nutrition: Goal: Adequate nutrition will be maintained Outcome: Completed/Met   Problem: Coping: Goal: Level of anxiety will decrease Outcome: Completed/Met   Problem: Elimination: Goal: Will not experience complications related to bowel motility Outcome: Completed/Met Goal: Will not experience complications related to urinary retention Outcome: Completed/Met   Problem: Pain Managment: Goal: General experience of comfort will improve Outcome: Completed/Met   Problem: Safety: Goal: Ability to remain free from injury will improve Outcome: Completed/Met   Problem: Skin Integrity: Goal: Risk for impaired skin integrity will decrease Outcome: Completed/Met  Pt d/c home today

## 2019-10-08 NOTE — Progress Notes (Addendum)
  Progress Note    10/08/2019 7:24 AM 1 Day Post-Op  Subjective:  No complaints  Afebrile HR 50's-70's NSR [id number] systolic 0000000 RA  Vitals:   10/07/19 2352 10/08/19 0309  BP: 135/70 (!) 134/57  Pulse: 69 66  Resp: 19 16  Temp: 98.2 F (36.8 C) 98.1 F (36.7 C)  SpO2: 96% 97%     Physical Exam: Neuro:  In tact Lungs:  Non labored Incision:  Clean and dry  CBC    Component Value Date/Time   WBC 10.6 (H) 10/08/2019 0330   RBC 3.51 (L) 10/08/2019 0330   HGB 11.6 (L) 10/08/2019 0330   HCT 35.0 (L) 10/08/2019 0330   PLT 227 10/08/2019 0330   MCV 99.7 10/08/2019 0330   MCH 33.0 10/08/2019 0330   MCHC 33.1 10/08/2019 0330   RDW 13.4 10/08/2019 0330   LYMPHSABS 3.0 09/25/2019 0244   MONOABS 1.3 (H) 09/25/2019 0244   EOSABS 0.3 09/25/2019 0244   BASOSABS 0.1 09/25/2019 0244    BMET    Component Value Date/Time   NA 137 10/08/2019 0330   K 3.7 10/08/2019 0330   CL 106 10/08/2019 0330   CO2 20 (L) 10/08/2019 0330   GLUCOSE 186 (H) 10/08/2019 0330   BUN 13 10/08/2019 0330   CREATININE 0.89 10/08/2019 0330   CALCIUM 8.7 (L) 10/08/2019 0330   GFRNONAA >60 10/08/2019 0330   GFRAA >60 10/08/2019 0330     Intake/Output Summary (Last 24 hours) at 10/08/2019 0724 Last data filed at 10/08/2019 0600 Gross per 24 hour  Intake 2774.77 ml  Output 2300 ml  Net 474.77 ml     Assessment/Plan:  This is a 86 y.o. male who is s/p right CEA 1 Day Post-Op  -pt is doing well this am. -pt neuro exam is in tact -pt has ambulated -pt has voided -f/u with Dr. Carlis Abbott in 2 weeks. -will send home with Vicodin 5/325 one q8h prn pain #4 (four)   Leontine Locket, PA-C [facility] [phone]  I have seen and evaluated the patient. I agree with the PA note as documented above. POD#1 s/p R CEA.  No acute events overnight.  Neuro intact.  Neck looks good.  Walked in hall.  Plan for d/c home today.  Will arrange follow-up in 3 weeks for wound  check.  Marty Heck, MD [facility] [place] Office: [phone]

## 2019-10-08 NOTE — Anesthesia Postprocedure Evaluation (Signed)
Anesthesia Post Note  Patient: [name]  Procedure(s) Performed: ENDARTERECTOMY CAROTID RIGHT (Right )     Patient location during evaluation: PACU Anesthesia Type: General Level of consciousness: awake and alert Pain management: pain level controlled Vital Signs Assessment: post-procedure vital signs reviewed and stable Respiratory status: spontaneous breathing, nonlabored ventilation and respiratory function stable Cardiovascular status: blood pressure returned to baseline and stable Postop Assessment: no apparent nausea or vomiting Anesthetic complications: no    Last Vitals:  Vitals:   10/08/19 0309 10/08/19 0728  BP: (!) 134/57 (!) 110/57  Pulse: 66 60  Resp: 16 15  Temp: 36.7 C 36.4 C  SpO2: 97% 97%    Last Pain:  Vitals:   10/08/19 0728  TempSrc: Oral  PainSc: 0-No pain                 Audry Pili

## 2019-10-08 NOTE — Discharge Instructions (Signed)
° °  [facility] [place] ° °Discharge Instructions °  °Carotid Endarterectomy (CEA) ° °Please refer to the following instructions for your post-procedure care. Your surgeon or physician assistant will discuss any changes with you. ° °Activity ° °You are encouraged to walk as much as you can. You can slowly return to normal activities but must avoid strenuous activity and heavy lifting until your doctor tell you it's okay. Avoid activities such as vacuuming or swinging a golf club. You can drive after one week if you are comfortable and you are no longer taking prescription pain medications. It is normal to feel tired for serval weeks after your surgery. It is also normal to have difficulty with sleep habits, eating, and bowel movements after surgery. These will go away with time. ° °Bathing/Showering ° °Shower daily after you go home. Do not soak in a bathtub, hot tub, or swim until the incision heals completely. ° °Incision Care ° °Shower every day. Clean your incision with mild soap and water. Pat the area dry with a clean towel. You do not need a bandage unless otherwise instructed. Do not apply any ointments or creams to your incision. You may have skin glue on your incision. Do not peel it off. It will come off on its own in about one week. Your incision may feel thickened and raised for several weeks after your surgery. This is normal and the skin will soften over time.  ° °For Men Only: It's okay to shave around the incision but do not shave the incision itself for 2 weeks. It is common to have numbness under your chin that could last for several months. ° °Diet ° °Resume your normal diet. There are no special food restrictions following this procedure. A low fat/low cholesterol diet is recommended for all patients with vascular disease. In order to heal from your surgery, it is CRITICAL to get adequate nutrition. Your body requires vitamins, minerals, and protein. Vegetables are the  best source of vitamins and minerals. Vegetables also provide the perfect balance of protein. Processed food has little nutritional value, so try to avoid this. ° °Medications ° °Resume taking all of your medications unless your doctor or physician assistant tells you not to. If your incision is causing pain, you may take over-the- counter pain relievers such as acetaminophen (Tylenol). If you were prescribed a stronger pain medication, please be aware these medications can cause nausea and constipation. Prevent nausea by taking the medication with a snack or meal. Avoid constipation by drinking plenty of fluids and eating foods with a high amount of fiber, such as fruits, vegetables, and grains.  °Do not take Tylenol if you are taking prescription pain medications. ° °Follow Up ° °Our office will schedule a follow up appointment 2-3 weeks following discharge. ° °Please call us immediately for any of the following conditions ° °Increased pain, redness, drainage (pus) from your incision site. °Fever of 101 degrees or higher. °If you should develop stroke (slurred speech, difficulty swallowing, weakness on one side of your body, loss of vision) you should call 911 and go to the nearest emergency room. ° °Reduce your risk of vascular disease: ° °Stop smoking. If you would like help call [facility] at 1-800-QUIT-NOW (1-800-784-8669) or [place] at 336-586-4000. °Manage your cholesterol °Maintain a desired weight °Control your diabetes °Keep your blood pressure down ° °If you have any questions, please call the office at [phone]. ° °

## 2019-10-08 NOTE — Discharge Summary (Signed)
Discharge Summary     CURTICE VENKATESAN [birth year] 86 y.o. male  [id number]  Admission Date: 10/07/2019  Discharge Date: 10/08/2019  Physician: Marty Heck, MD  Admission Diagnosis: Carotid artery stenosis [I65.29]   HPI:   This is a 86 y.o. male with history of diabetes, coronary artery disease status post previous PCI, A. fib not on anticoagulation, hypertension, hyperlipidemia, previous stroke about 10 years ago that vascular surgery has been consulted for symptomatic right carotid stenosis. Patient states on Monday he noticed some blurry right eye vision. Ultimately presented to his PCP who obtained an MRI. MRI showed multiple foci of acute nonhemorrhagic infarcts within the right MCA distribution. Patient ultimately was referred to the ED for further evaluation. CTA neck then showed significant plaque in the right ICA origin causing at least 80% stenosis with no flow-limiting stenosis of the left ICA.  Patient states he lives independently in a care facility. States he is able to drive a car. States he walks with a walker due to left knee pain. States he had a stroke about 10 years ago with some left hand tingling that resolved. Denies any previous head or neck surgery. Was on Plavix prior to his stroke but now is on Eliquis. Apparently anticoagulation held in past due to GIB and duodenal ulcer. Feels he is back at his neuro baseline at this time.   Hospital Course:  The patient was admitted to the hospital and taken to the operating room on 10/07/2019 and underwent right carotid endarterectomy.    Findings: 1.  Right high grade calcified carotid plaque. 2.  Continuous Doppler audible flow signatures are appropriate for each carotid artery after endarterectomy. 3.  No evidence of intimal flap visualized on transverse or longitudinal ultrasonography.  The pt tolerated the procedure well and was transported to the PACU in good condition.   By POD 1, the pt neuro status was in  tact.  Pt doing well.  The remainder of the hospital course consisted of increasing mobilization and increasing intake of solids without difficulty.   Recent Labs    10/07/19 1100 10/08/19 0330  NA 140 137  K 4.1 3.7  CL 109 106  CO2 20* 20*  GLUCOSE 139* 186*  BUN 15 13  CALCIUM 9.3 8.7*   Recent Labs    10/07/19 1100 10/08/19 0330  WBC 11.5* 10.6*  HGB 14.2 11.6*  HCT 43.7 35.0*  PLT 279 227   Recent Labs    10/07/19 1100  INR 1.1       Discharge Diagnosis:  Carotid artery stenosis [I65.29]  Secondary Diagnosis: Patient Active Problem List   Diagnosis Date Noted  . Carotid artery stenosis 10/07/2019  . Acute right MCA stroke ([place]) 09/24/2019  . PAF (paroxysmal atrial fibrillation) ([place]) 09/24/2019  . Leukocytosis 09/24/2019  . ST elevation myocardial infarction (STEMI) involving left anterior descending (LAD) coronary artery without development of Q waves ([place]) 12/11/2016  . History of GI bleed 12/11/2016  . Dyslipidemia 12/11/2016  . Hypothyroidism 12/11/2016  . CAD S/P percutaneous coronary angioplasty 12/11/2016  . Acute esophagitis 09/28/2013  . Fatty liver 09/28/2013  . Atrial fibrillation with RVR ([place]) 09/28/2013  . Elevated lipase 09/28/2013  . Essential hypertension 09/28/2013  . Controlled diabetes mellitus type II without complication ([place]) [id number]  . Acute duodenal ulcer with bleeding 09/26/2013  . Pre-syncope 06/16/2013  . Acute blood loss anemia 04/29/2013  . S/P right THA, AA 04/28/2013  . History of stroke 12/12/2010   Past Medical History:  Diagnosis Date  . Anemia   . Arthritis   . CAD S/P percutaneous coronary angioplasty    a. 11/2016: anterior STEMI s/p DES to LAD.   . Diabetes mellitus without complication ([place])    Diet controlled  . GERD (gastroesophageal reflux disease)   . GI bleed    10/2013: 2/2 duodenal ulcer. ASA/plavix discontinued.   . Hyperlipemia   . Hypertension   . Hypothyroidism   . PAF (paroxysmal  atrial fibrillation) ([place])    a. noted during admission in 10/2013 for acute GI bleed. No recurrunce since. Monitor in 2014 with only PAC/PVCs  . Peripheral vascular disease ([place])   . ST elevation myocardial infarction (STEMI) involving left anterior descending (LAD) coronary artery with complication ([place]) [id number]   - 99% subtotal occlusion of LAD treated with DES stent.  . Stroke [facility])    a. ichemic CVA in 2012.     Allergies as of 10/08/2019      Reactions   Adhesive [tape] Rash   Zoll pads adhesive      Medication List    TAKE these medications   apixaban 5 MG Tabs tablet Commonly known as: ELIQUIS Take 1 tablet (5 mg total) by mouth 2 (two) times daily.   clopidogrel 75 MG tablet Commonly known as: PLAVIX Take 75 mg by mouth daily. To be taken just prior to this surgery scheduled on 10/07/19   ergocalciferol 1.25 MG (50000 UT) capsule Commonly known as: VITAMIN D2 Take 50,000 Units by mouth once a week. Patient takes on Fridays   Glyxambi 10-5 MG Tabs Generic drug: Empagliflozin-linaGLIPtin Take 1 tablet by mouth daily.   HYDROcodone-acetaminophen 5-325 MG tablet Commonly known as: NORCO/VICODIN Take 1 tablet by mouth every 8 (eight) hours as needed for moderate pain.   Lantus SoloStar 100 UNIT/ML Solostar Pen Generic drug: Insulin Glargine Inject 15 Units into the skin daily.   levothyroxine 50 MCG tablet Commonly known as: SYNTHROID Take 50 mcg by mouth daily before breakfast.   metoprolol tartrate 25 MG tablet Commonly known as: LOPRESSOR Take 1 tablet (25 mg total) by mouth 2 (two) times daily.   nitroGLYCERIN 0.4 MG SL tablet Commonly known as: NITROSTAT Place 1 tablet (0.4 mg total) under the tongue every 5 (five) minutes x 3 doses as needed for chest pain.   pantoprazole 40 MG tablet Commonly known as: PROTONIX TAKE 1 TABLET ONCE DAILY.   rosuvastatin 20 MG tablet Commonly known as: CRESTOR Take 20 mg by mouth every morning.        [facility] Discharge Instructions Carotid Endarterectomy (CEA)  Please refer to the following instructions for your post-procedure care. Your surgeon or physician assistant will discuss any changes with you.  Activity  You are encouraged to walk as much as you can. You can slowly return to normal activities but must avoid strenuous activity and heavy lifting until your doctor tell you it's OK. Avoid activities such as vacuuming or swinging a golf club. You can drive after one week if you are comfortable and you are no longer taking prescription pain medications. It is normal to feel tired for serval weeks after your surgery. It is also normal to have difficulty with sleep habits, eating, and bowel movements after surgery. These will go away with time.  Bathing/Showering  You may shower after you come home. Do not soak in a bathtub, hot tub, or swim until the incision heals completely.  Incision Care  Shower every day. Clean  your incision with mild soap and water. Pat the area dry with a clean towel. You do not need a bandage unless otherwise instructed. Do not apply any ointments or creams to your incision. You may have skin glue on your incision. Do not peel it off. It will come off on its own in about one week. Your incision may feel thickened and raised for several weeks after your surgery. This is normal and the skin will soften over time. For Men Only: It's OK to shave around the incision but do not shave the incision itself for 2 weeks. It is common to have numbness under your chin that could last for several months.  Diet  Resume your normal diet. There are no special food restrictions following this procedure. A low fat/low cholesterol diet is recommended for all patients with vascular disease. In order to heal from your surgery, it is CRITICAL to get adequate nutrition. Your body requires vitamins, minerals, and protein. Vegetables are the best source of vitamins  and minerals. Vegetables also provide the perfect balance of protein. Processed food has little nutritional value, so try to avoid this.  Medications  Resume taking all of your medications unless your doctor or physician assistant tells you not to.  If your incision is causing pain, you may take over-the- counter pain relievers such as acetaminophen (Tylenol). If you were prescribed a stronger pain medication, please be aware these medications can cause nausea and constipation.  Prevent nausea by taking the medication with a snack or meal. Avoid constipation by drinking plenty of fluids and eating foods with a high amount of fiber, such as fruits, vegetables, and grains.  Do not take Tylenol if you are taking prescription pain medications.  Follow Up  Our office will schedule a follow up appointment 2-3 weeks following discharge.  Please call us immediately for any of the following conditions  . Increased pain, redness, drainage (pus) from your incision site. . Fever of 101 degrees or higher. . If you should develop stroke (slurred speech, difficulty swallowing, weakness on one side of your body, loss of vision) you should call 911 and go to the nearest emergency room. .  Reduce your risk of vascular disease:  . Stop smoking. If you would like help call [facility] at 1-800-QUIT-NOW 5097437918) or [place] at (780) [phone]. . Manage your cholesterol . Maintain a desired weight . Control your diabetes . Keep your blood pressure down .  If you have any questions, please call the office at [phone].  Prescriptions given: 1.   Vicodin #4 No Refill  Disposition: home  Patient's condition: is Good  Follow up: 1. Dr. Carlis Abbott in 2 weeks.   Leontine Locket, PA-C [facility] [phone]   --- For [facility] use ---   Modified Rankin score at D/C (0-6): 0  IV medication needed for:  1. Hypertension: No 2. Hypotension: No  Post-op Complications: No   1. Post-op CVA or TIA: No  If yes: Event classification (right eye, left eye, right cortical, left cortical, verterobasilar, other): n/a  If yes: Timing of event (intra-op, <6 hrs post-op, >=6 hrs post-op, unknown): n/a  2. CN injury: No  If yes: CN n/a injuried   3. Myocardial infarction: No  If yes: Dx by (EKG or clinical, Troponin): n/a  4.  CHF: No  5.  Dysrhythmia (new): No  6. Wound infection: No  7. Reperfusion symptoms: No  8. Return to OR: No  If yes: return to OR  for (bleeding, neurologic, other CEA incision, other): n/a  Discharge medications: Statin use:  Yes ASA use:  No   Beta blocker use:  Yes ACE-Inhibitor use:  No  ARB use:  No CCB use: No P2Y12 Antagonist use: Yes, [x ] Plavix, [ ]  Plasugrel, [ ]  Ticlopinine, [ ]  Ticagrelor, [ ]  Other, [ ]  No for medical reason, [ ]  Non-compliant, [ ]  Not-indicated Anti-coagulant use:  No, [ ]  Warfarin, [ ]  Rivaroxaban, [ ]  Dabigatran,

## 2019-10-10 DIAGNOSIS — E1151 Type 2 diabetes mellitus with diabetic peripheral angiopathy without gangrene: Secondary | ICD-10-CM | POA: Diagnosis not present

## 2019-10-10 DIAGNOSIS — Z794 Long term (current) use of insulin: Secondary | ICD-10-CM | POA: Diagnosis not present

## 2019-10-10 DIAGNOSIS — Z8673 Personal history of transient ischemic attack (TIA), and cerebral infarction without residual deficits: Secondary | ICD-10-CM | POA: Diagnosis not present

## 2019-10-10 DIAGNOSIS — I480 Paroxysmal atrial fibrillation: Secondary | ICD-10-CM | POA: Diagnosis not present

## 2019-10-10 DIAGNOSIS — Z7901 Long term (current) use of anticoagulants: Secondary | ICD-10-CM | POA: Diagnosis not present

## 2019-10-10 DIAGNOSIS — I69312 Visuospatial deficit and spatial neglect following cerebral infarction: Secondary | ICD-10-CM | POA: Diagnosis not present

## 2019-10-10 DIAGNOSIS — I2510 Atherosclerotic heart disease of native coronary artery without angina pectoris: Secondary | ICD-10-CM | POA: Diagnosis not present

## 2019-10-10 DIAGNOSIS — Z955 Presence of coronary angioplasty implant and graft: Secondary | ICD-10-CM | POA: Diagnosis not present

## 2019-10-10 DIAGNOSIS — I6521 Occlusion and stenosis of right carotid artery: Secondary | ICD-10-CM | POA: Diagnosis not present

## 2019-10-10 DIAGNOSIS — Z48812 Encounter for surgical aftercare following surgery on the circulatory system: Secondary | ICD-10-CM | POA: Diagnosis not present

## 2019-10-10 DIAGNOSIS — I10 Essential (primary) hypertension: Secondary | ICD-10-CM | POA: Diagnosis not present

## 2019-10-13 DIAGNOSIS — I6521 Occlusion and stenosis of right carotid artery: Secondary | ICD-10-CM | POA: Diagnosis not present

## 2019-10-13 DIAGNOSIS — Z48812 Encounter for surgical aftercare following surgery on the circulatory system: Secondary | ICD-10-CM | POA: Diagnosis not present

## 2019-10-13 DIAGNOSIS — E1151 Type 2 diabetes mellitus with diabetic peripheral angiopathy without gangrene: Secondary | ICD-10-CM | POA: Diagnosis not present

## 2019-10-13 DIAGNOSIS — I480 Paroxysmal atrial fibrillation: Secondary | ICD-10-CM | POA: Diagnosis not present

## 2019-10-13 DIAGNOSIS — I10 Essential (primary) hypertension: Secondary | ICD-10-CM | POA: Diagnosis not present

## 2019-10-13 DIAGNOSIS — I69312 Visuospatial deficit and spatial neglect following cerebral infarction: Secondary | ICD-10-CM | POA: Diagnosis not present

## 2019-10-14 ENCOUNTER — Encounter: Payer: Self-pay | Admitting: *Deleted

## 2019-10-14 ENCOUNTER — Other Ambulatory Visit: Payer: Self-pay | Admitting: *Deleted

## 2019-10-14 NOTE — Patient Outreach (Signed)
[facility]) Care Management [facility] CM Telephone Outreach- EMMI Red Alert Notification/ General Discharge PCP office completes Transition of Care follow up post-hospital discharge Post-hospital discharge day # 6   10/14/2019  [name] 1923/10/16 [id number]  EMMI Red Alert Notification- General Discharge EMMI call date/ day #:  Tuesday October 13, 2019, day # 4 EMMI Red Alert reason: No scheduled follow up  Successful telephone outreach to [name], 86 y/o male referred to [facility] CM this morning after [place] for General Discharge as above; patient was recently hospitalized December 23-26, 2020 for CVA/ MCA; patient was subsequently scheduled for carotid endarterectomy, which was completed January 6-7, 2021; patient was discharged home to self- care.  Patient has history including, but not limited to, recent CVA; HTN/ HLD; A-Fib with RVR; DM- T II; CAD previous STEMI and PCI.    Purpose of call discussed with along with [facility] CM services; patient agrees to complete screening call for EMMI red-alert, however, patient reports that he lives at [facility] "and is well taken care of;" reports that staff check on him frequently and provide all of his meals.  Reports that he continues to drive himself and reports independence with ADL's.  Patient reports that he is doing fine and he denies pain, concerns, problems post-hospital discharge.  Screening call completed.  Discussed with patient EMMI Red alert item, and he explains that the reason he does not have scheduled follow up with his surgeon is because the surgeon has not yet contacted him to schedule appointment; he stated that the surgeon specifically told him to expect a call from his staff "within 3 weeks" to schedule post-operative office visit; stated he was told by surgeon to call for any questions/ problems, of which he has neither.  I encouraged patient to contact PCP/ surgeon should he develop new  concerns, and he verbalizes agreement.  Patient denies further issues, concerns, or problems today and he declines ongoing [facility] CM follow up, stating that he "does not need it,: he reiterates that he has "pl[facility] of [facility] at [facility]" should he need any assistance.  Explained to patient that I would place letter in mail to him should he change his mind in the future and he is agreeable to this  Plan:  Will close EMMI Red-Alert case as patient has declined [facility] CM services and no needs were identified during screening outreach today, and will make patient's PCP aware of same.  Oneta Rack, RN, BSN, [employer] [facility] Care Management  [phone]

## 2019-10-15 DIAGNOSIS — I6521 Occlusion and stenosis of right carotid artery: Secondary | ICD-10-CM | POA: Diagnosis not present

## 2019-10-15 DIAGNOSIS — I69312 Visuospatial deficit and spatial neglect following cerebral infarction: Secondary | ICD-10-CM | POA: Diagnosis not present

## 2019-10-15 DIAGNOSIS — E1151 Type 2 diabetes mellitus with diabetic peripheral angiopathy without gangrene: Secondary | ICD-10-CM | POA: Diagnosis not present

## 2019-10-15 DIAGNOSIS — I480 Paroxysmal atrial fibrillation: Secondary | ICD-10-CM | POA: Diagnosis not present

## 2019-10-15 DIAGNOSIS — Z48812 Encounter for surgical aftercare following surgery on the circulatory system: Secondary | ICD-10-CM | POA: Diagnosis not present

## 2019-10-15 DIAGNOSIS — I10 Essential (primary) hypertension: Secondary | ICD-10-CM | POA: Diagnosis not present

## 2019-10-22 DIAGNOSIS — I10 Essential (primary) hypertension: Secondary | ICD-10-CM | POA: Diagnosis not present

## 2019-10-22 DIAGNOSIS — I480 Paroxysmal atrial fibrillation: Secondary | ICD-10-CM | POA: Diagnosis not present

## 2019-10-22 DIAGNOSIS — I69312 Visuospatial deficit and spatial neglect following cerebral infarction: Secondary | ICD-10-CM | POA: Diagnosis not present

## 2019-10-22 DIAGNOSIS — E1151 Type 2 diabetes mellitus with diabetic peripheral angiopathy without gangrene: Secondary | ICD-10-CM | POA: Diagnosis not present

## 2019-10-22 DIAGNOSIS — Z48812 Encounter for surgical aftercare following surgery on the circulatory system: Secondary | ICD-10-CM | POA: Diagnosis not present

## 2019-10-22 DIAGNOSIS — I6521 Occlusion and stenosis of right carotid artery: Secondary | ICD-10-CM | POA: Diagnosis not present

## 2019-10-24 ENCOUNTER — Other Ambulatory Visit: Payer: Self-pay | Admitting: Cardiology

## 2019-10-27 DIAGNOSIS — Z23 Encounter for immunization: Secondary | ICD-10-CM | POA: Diagnosis not present

## 2019-10-28 DIAGNOSIS — I480 Paroxysmal atrial fibrillation: Secondary | ICD-10-CM | POA: Diagnosis not present

## 2019-10-28 DIAGNOSIS — Z48812 Encounter for surgical aftercare following surgery on the circulatory system: Secondary | ICD-10-CM | POA: Diagnosis not present

## 2019-10-28 DIAGNOSIS — E1151 Type 2 diabetes mellitus with diabetic peripheral angiopathy without gangrene: Secondary | ICD-10-CM | POA: Diagnosis not present

## 2019-10-28 DIAGNOSIS — I69312 Visuospatial deficit and spatial neglect following cerebral infarction: Secondary | ICD-10-CM | POA: Diagnosis not present

## 2019-10-28 DIAGNOSIS — I10 Essential (primary) hypertension: Secondary | ICD-10-CM | POA: Diagnosis not present

## 2019-10-28 DIAGNOSIS — I6521 Occlusion and stenosis of right carotid artery: Secondary | ICD-10-CM | POA: Diagnosis not present

## 2019-10-31 DIAGNOSIS — I10 Essential (primary) hypertension: Secondary | ICD-10-CM | POA: Diagnosis not present

## 2019-10-31 DIAGNOSIS — I6521 Occlusion and stenosis of right carotid artery: Secondary | ICD-10-CM | POA: Diagnosis not present

## 2019-10-31 DIAGNOSIS — I480 Paroxysmal atrial fibrillation: Secondary | ICD-10-CM | POA: Diagnosis not present

## 2019-10-31 DIAGNOSIS — Z48812 Encounter for surgical aftercare following surgery on the circulatory system: Secondary | ICD-10-CM | POA: Diagnosis not present

## 2019-10-31 DIAGNOSIS — I69312 Visuospatial deficit and spatial neglect following cerebral infarction: Secondary | ICD-10-CM | POA: Diagnosis not present

## 2019-10-31 DIAGNOSIS — E1151 Type 2 diabetes mellitus with diabetic peripheral angiopathy without gangrene: Secondary | ICD-10-CM | POA: Diagnosis not present

## 2019-11-18 DIAGNOSIS — L570 Actinic keratosis: Secondary | ICD-10-CM | POA: Diagnosis not present

## 2019-11-18 DIAGNOSIS — D485 Neoplasm of uncertain behavior of skin: Secondary | ICD-10-CM | POA: Diagnosis not present

## 2019-11-18 DIAGNOSIS — C4441 Basal cell carcinoma of skin of scalp and neck: Secondary | ICD-10-CM | POA: Diagnosis not present

## 2019-11-18 DIAGNOSIS — Z85828 Personal history of other malignant neoplasm of skin: Secondary | ICD-10-CM | POA: Diagnosis not present

## 2019-11-20 DIAGNOSIS — Z85828 Personal history of other malignant neoplasm of skin: Secondary | ICD-10-CM | POA: Diagnosis not present

## 2019-11-20 DIAGNOSIS — L7622 Postprocedural hemorrhage and hematoma of skin and subcutaneous tissue following other procedure: Secondary | ICD-10-CM | POA: Diagnosis not present

## 2019-12-10 ENCOUNTER — Other Ambulatory Visit: Payer: Self-pay | Admitting: Cardiology

## 2019-12-14 DIAGNOSIS — E119 Type 2 diabetes mellitus without complications: Secondary | ICD-10-CM | POA: Diagnosis not present

## 2019-12-14 DIAGNOSIS — G453 Amaurosis fugax: Secondary | ICD-10-CM | POA: Diagnosis not present

## 2019-12-14 DIAGNOSIS — H35033 Hypertensive retinopathy, bilateral: Secondary | ICD-10-CM | POA: Diagnosis not present

## 2019-12-14 DIAGNOSIS — H524 Presbyopia: Secondary | ICD-10-CM | POA: Diagnosis not present

## 2019-12-14 DIAGNOSIS — H53451 Other localized visual field defect, right eye: Secondary | ICD-10-CM | POA: Diagnosis not present

## 2019-12-14 DIAGNOSIS — H52223 Regular astigmatism, bilateral: Secondary | ICD-10-CM | POA: Diagnosis not present

## 2019-12-16 DIAGNOSIS — Z85828 Personal history of other malignant neoplasm of skin: Secondary | ICD-10-CM | POA: Diagnosis not present

## 2019-12-16 DIAGNOSIS — L570 Actinic keratosis: Secondary | ICD-10-CM | POA: Diagnosis not present

## 2019-12-30 NOTE — Telephone Encounter (Signed)
Yes  Leroy Trim, MD  

## 2020-01-01 ENCOUNTER — Other Ambulatory Visit: Payer: Self-pay | Admitting: Cardiology

## 2020-01-08 DIAGNOSIS — E1129 Type 2 diabetes mellitus with other diabetic kidney complication: Secondary | ICD-10-CM | POA: Diagnosis not present

## 2020-01-08 DIAGNOSIS — Z7901 Long term (current) use of anticoagulants: Secondary | ICD-10-CM | POA: Diagnosis not present

## 2020-01-08 DIAGNOSIS — I10 Essential (primary) hypertension: Secondary | ICD-10-CM | POA: Diagnosis not present

## 2020-01-08 DIAGNOSIS — N08 Glomerular disorders in diseases classified elsewhere: Secondary | ICD-10-CM | POA: Diagnosis not present

## 2020-01-08 DIAGNOSIS — Z794 Long term (current) use of insulin: Secondary | ICD-10-CM | POA: Diagnosis not present

## 2020-01-08 DIAGNOSIS — I4891 Unspecified atrial fibrillation: Secondary | ICD-10-CM | POA: Diagnosis not present

## 2020-01-08 DIAGNOSIS — R269 Unspecified abnormalities of gait and mobility: Secondary | ICD-10-CM | POA: Diagnosis not present

## 2020-01-08 DIAGNOSIS — I6529 Occlusion and stenosis of unspecified carotid artery: Secondary | ICD-10-CM | POA: Diagnosis not present

## 2020-01-08 DIAGNOSIS — N1831 Chronic kidney disease, stage 3a: Secondary | ICD-10-CM | POA: Diagnosis not present

## 2020-01-08 DIAGNOSIS — E038 Other specified hypothyroidism: Secondary | ICD-10-CM | POA: Diagnosis not present

## 2020-01-08 DIAGNOSIS — E559 Vitamin D deficiency, unspecified: Secondary | ICD-10-CM | POA: Diagnosis not present

## 2020-01-08 DIAGNOSIS — I2510 Atherosclerotic heart disease of native coronary artery without angina pectoris: Secondary | ICD-10-CM | POA: Diagnosis not present

## 2020-01-11 ENCOUNTER — Encounter: Payer: Self-pay | Admitting: Cardiology

## 2020-01-11 ENCOUNTER — Ambulatory Visit (INDEPENDENT_AMBULATORY_CARE_PROVIDER_SITE_OTHER): Payer: Medicare Other | Admitting: Cardiology

## 2020-01-11 ENCOUNTER — Other Ambulatory Visit: Payer: Self-pay

## 2020-01-11 VITALS — BP 138/72 | HR 61 | Temp 97.0°F | Ht 69.0 in | Wt 168.8 lb

## 2020-01-11 DIAGNOSIS — I2510 Atherosclerotic heart disease of native coronary artery without angina pectoris: Secondary | ICD-10-CM | POA: Diagnosis not present

## 2020-01-11 DIAGNOSIS — I2102 ST elevation (STEMI) myocardial infarction involving left anterior descending coronary artery: Secondary | ICD-10-CM | POA: Diagnosis not present

## 2020-01-11 DIAGNOSIS — I10 Essential (primary) hypertension: Secondary | ICD-10-CM | POA: Diagnosis not present

## 2020-01-11 DIAGNOSIS — Z8719 Personal history of other diseases of the digestive system: Secondary | ICD-10-CM

## 2020-01-11 DIAGNOSIS — I480 Paroxysmal atrial fibrillation: Secondary | ICD-10-CM | POA: Diagnosis not present

## 2020-01-11 DIAGNOSIS — Z9861 Coronary angioplasty status: Secondary | ICD-10-CM

## 2020-01-11 DIAGNOSIS — E785 Hyperlipidemia, unspecified: Secondary | ICD-10-CM | POA: Diagnosis not present

## 2020-01-11 NOTE — Progress Notes (Signed)
Primary Care Provider: Reynold Bowen, MD Cardiologist: Glenetta Hew, MD Electrophysiologist: None  Clinic Note: Chief Complaint  Patient presents with  . Follow-up    Hospitalized for stroke in December  . Coronary Artery Disease    No angina    HPI:    [name] is a 86 y.o. male with a PMH notable for anterior STEMI-PCI LAD with peri-MI A. fib, history of ischemic stroke in 2012 and now December 2020 who presents today for annual cardiology follow-up.   S/p  Anterior STEMI in March 2018.  He has a history of a major GI bleed requiring transfusion in 2014 &    Ischemic stroke in 2012 with carotid artery disease. -->  Recent CVA with right carotid disease-RCA December 2020  He has hypertension and diet-controlled diabetes.  [name] was last seen on December 08, 2018 -> was doing amazingly well with no major cardiac issues.  We had stopped his aspirin and had not treated with photocoagulation for possible A. fib because of bleeding issues and bruising.  Recent Hospitalizations:   12/23-26, 2020 -> admitted for CVA -> started on Eliquis -> despite this, that was felt to be more related to right carotid disease and not as likely to be from A. fib since he has not had any A. fib symptoms.  1/6-03/2020: Admitted for R Carotid R CEA  Reviewed  CV studies:    The following studies were reviewed today: (if available, images/films reviewed: From Epic Chart or Care Everywhere) . 12/23-26/2020 echocardiogram admitted for CVA): EF 55-60%, No RWMA. R1-2 DD. Normal Atrial Size. Normal Valve Dz.    Interval History:   [name] presents here today for follow-up doing quite well overall from a cardiac standpoint.  He has not had any residual symptoms whatsoever from his stroke.Marland Kitchen  His neck is healing well without any major issues.  He is now walking about a mile and a half with his walker.  He really is limited more by knee pain than anything else.  CV Review of Symptoms  (Summary) Cardiovascular ROS: no chest pain or dyspnea on exertion positive for - Mild right greater than left ankle swelling.  The swelling on the left side is more related to the knee. negative for - irregular heartbeat, orthopnea, palpitations, paroxysmal nocturnal dyspnea, rapid heart rate, shortness of breath or Syncope/near syncope or further TIA/amaurosis fugax.  No claudication.  The patient does not have symptoms concerning for COVID-19 infection (fever, chills, cough, or new shortness of breath).  The patient is practicing social distancing & Masking.   He completed his COVID-19 vaccine in the end of February.  REVIEWED OF SYSTEMS   Review of Systems  Constitutional: Negative for malaise/fatigue and weight loss.  HENT: Negative for congestion and nosebleeds.   Cardiovascular: Positive for leg swelling (Mild ankle swelling right> left).  Gastrointestinal: Negative for abdominal pain, blood in stool and melena.  Genitourinary: Negative for hematuria.  Musculoskeletal: Positive for joint pain (Mostly knee pain; he does have right-sided trace ankle swelling.).       Walks with a rolling walker.  Neurological: Negative for dizziness (Only if he stands very quickly), focal weakness, weakness and headaches.  Psychiatric/Behavioral: Positive for memory loss. Negative for depression. The patient is not nervous/anxious and does not have insomnia.    I have reviewed and (if needed) personally updated the patient's problem list, medications, allergies, past medical and surgical history, social and family history.   PAST MEDICAL HISTORY  Past Medical History:  Diagnosis Date  . Anemia   . Arthritis   . CAD S/P percutaneous coronary angioplasty    a. 11/2016: anterior STEMI s/p DES to LAD.   . Diabetes mellitus without complication ([place])    Diet controlled  . GERD (gastroesophageal reflux disease)   . GI bleed    10/2013: 2/2 duodenal ulcer. ASA/plavix discontinued.   . Hyperlipemia    . Hypertension   . Hypothyroidism   . PAF (paroxysmal atrial fibrillation) ([place])    a. noted during admission in 10/2013 for acute GI bleed. No recurrunce since. Monitor in 2014 with only PAC/PVCs  . Peripheral vascular disease ([place])   . ST elevation myocardial infarction (STEMI) involving left anterior descending (LAD) coronary artery with complication ([place]) [id number]   - 99% subtotal occlusion of LAD treated with DES stent.  . Stroke [facility])    a. ichemic CVA in 2012.     PAST SURGICAL HISTORY   Past Surgical History:  Procedure Laterality Date  . CORONARY STENT INTERVENTION N/A 12/11/2016   Procedure: Coronary Stent Intervention-Mid LAD;  Surgeon: Leonie Man, MD;  Location: [place] [facility];  Service: Cardiovascular:  mLAD 99%-0% Synergy DES 2.75 x 16 (3.0 mm)  . ENDARTERECTOMY Right 10/07/2019   Procedure: ENDARTERECTOMY CAROTID RIGHT;  Surgeon: Marty Heck, MD;  Location: [facility];  Service: Vascular;  Laterality: Right;  . ESOPHAGOGASTRODUODENOSCOPY N/A 09/23/2013   Procedure: ESOPHAGOGASTRODUODENOSCOPY (EGD);  Surgeon: Lafayette Dragon, MD;  Location: [facility] ENDOSCOPY;  Service: Endoscopy;  Laterality: N/A;  . ESOPHAGOGASTRODUODENOSCOPY N/A 09/25/2013   Procedure: ESOPHAGOGASTRODUODENOSCOPY (EGD);  Surgeon: Beryle Beams, MD;  Location[facility] ENDOSCOPY;  Service: Endoscopy;  Laterality: N/A;  . ESOPHAGOGASTRODUODENOSCOPY N/A 12/18/2013   Procedure: ESOPHAGOGASTRODUODENOSCOPY (EGD);  Surgeon: Beryle Beams, MD;  Location: [facility];  Service: Endoscopy;  Laterality: N/A;  . LEFT HEART CATH AND CORONARY ANGIOGRAPHY N/A 12/11/2016   Procedure: Left Heart Cath and Coronary Angiography;  Surgeon: Leonie Man, MD;  Location: [place] [facility];  Service: Cardiovascular: LM 45%. mLAD 99% (PCI), dLAD 40% & 50%, ostD1 - 50%. Ost-prox Cx 60%, OstOM1 60% (Med Rx).  Anteroapical HK - EF 50-55%. Normal LVEDP  . TOTAL HIP ARTHROPLASTY Right 04/28/2013   Procedure: RIGHT TOTAL HIP  ARTHROPLASTY ANTERIOR APPROACH;  Surgeon: Mauri Pole, MD;  Location: [facility];  Service: Orthopedics;  Laterality: Right;  . TRANSTHORACIC ECHOCARDIOGRAM  12/12/2016   Status post anterior STEMI:  Normal LV size with normal function. EF 60 deficits 5% with no regional wall motion abnormality. GR 2 DD. Mild LA dilation.    Cardiac Cath-PCI 12/11/2016:Wall Motion: EF 50-50% ;                  Diagnostic Diagram                                      Post-Intervention     Culprit 99% mLAD    - Synergy DES 2.75 x 16 (3.0 mm) ,     MEDICATIONS/ALLERGIES   Current Meds  Medication Sig  . clopidogrel (PLAVIX) 75 MG tablet Take 1 tablet (75 mg total) by mouth daily. NEED OV.  Marland Kitchen ELIQUIS 5 MG TABS tablet TAKE 1 TABLET BY MOUTH TWICE DAILY.  . Empagliflozin-linaGLIPtin (GLYXAMBI) 10-5 MG TABS Take 1 tablet by mouth daily.   . ergocalciferol (VITAMIN D2) 50000 UNITS capsule Take 50,000  Units by mouth once a week. Patient takes on Fridays  . HYDROcodone-acetaminophen (NORCO/VICODIN) 5-325 MG tablet Take 1 tablet by mouth every 8 (eight) hours as needed for moderate pain.  Marland Kitchen LANTUS SOLOSTAR 100 UNIT/ML Solostar Pen Inject 15 Units into the skin daily.   Marland Kitchen levothyroxine (SYNTHROID, LEVOTHROID) 50 MCG tablet Take 50 mcg by mouth daily before breakfast.  . metoprolol tartrate (LOPRESSOR) 25 MG tablet Take 1 tablet (25 mg total) by mouth 2 (two) times daily.  . nitroGLYCERIN (NITROSTAT) 0.4 MG SL tablet Place 1 tablet (0.4 mg total) under the tongue every 5 (five) minutes x 3 doses as needed for chest pain.  . pantoprazole (PROTONIX) 40 MG tablet TAKE 1 TABLET ONCE DAILY.  . rosuvastatin (CRESTOR) 20 MG tablet Take 20 mg by mouth every morning.     Allergies  Allergen Reactions  . Adhesive [Tape] Rash    Zoll pads adhesive    SOCIAL HISTORY/FAMILY HISTORY   Reviewed in Epic:  Pertinent findings: Still lives at the independent living facility-[facility], but his daughter carefully watches out for his  care.   OBJCTIVE -PE, EKG, labs   Wt Readings from Last 3 Encounters:  01/11/20 168 lb 12.8 oz (76.6 kg)  10/07/19 171 lb 1.2 oz (77.6 kg)  09/24/19 169 lb 1.5 oz (76.7 kg)    Physical Exam: BP 138/72   Pulse 61   Temp (!) 97 F (36.1 C)   Ht 5\' 9"  (1.753 m)   Wt 168 lb 12.8 oz (76.6 kg)   SpO2 99%   BMI 24.93 kg/m  Physical Exam  Constitutional: He is oriented to person, place, and time. He appears well-developed and well-nourished. No distress.  Looks healthy for stated age.  A little bit older and more frazzled than last time I saw him, but still doing well. He is here now with a rolling walker (not Rollator)  HENT:  Head: Atraumatic.  He has multiple areas of dark lotion application on his forehead and scalp as well as face for treatment of skin cancer -> looks like diffuse eschar, but it is clearly not  Neck: No hepatojugular reflux and no JVD present. Carotid bruit is not present.  Right CEA scar healed well.  Cardiovascular: Normal rate, regular rhythm, S1 normal, S2 normal, normal heart sounds and intact distal pulses.  Occasional extrasystoles are present. PMI is not displaced. Exam reveals no gallop and no distant heart sounds.  No murmur heard. Pulmonary/Chest: Effort normal and breath sounds normal. No respiratory distress. He has no wheezes. He has no rales.  Abdominal: Soft. Bowel sounds are normal. He exhibits no distension. There is no abdominal tenderness.  Musculoskeletal:        General: Edema (Trivial 1+) present. Normal range of motion.     Cervical back: Normal range of motion and neck supple.  Neurological: He is alert and oriented to person, place, and time.  Psychiatric: He has a normal mood and affect. His behavior is normal. Judgment and thought content normal.  Vitals reviewed.    Adult ECG Report N/a   Recent Labs:    Lab Results  Component Value Date   CHOL 97 09/25/2019   HDL 30 (L) 09/25/2019   LDLCALC 38 09/25/2019   TRIG 145  09/25/2019   CHOLHDL 3.2 09/25/2019   Lab Results  Component Value Date   CREATININE 0.89 10/08/2019   BUN 13 10/08/2019   NA 137 10/08/2019   K 3.7 10/08/2019   CL 106 10/08/2019  CO2 20 (L) 10/08/2019   Lab Results  Component Value Date   TSH 1.451 09/24/2019    ASSESSMENT/PLAN    Problem List Items Addressed This Visit    ST elevation myocardial infarction (STEMI) involving left anterior descending (LAD) coronary artery without development of Q waves (HCC) (Chronic)    History of large anterior MI but with no further angina or heart failure symptoms.  EF seem to be pretty preserved with full recovery. Doing well on beta-blocker, statin.      CAD S/P percutaneous coronary angioplasty - Primary (Chronic)    Doing remarkably well following his PCI.  Has not had any further anginal symptoms. Is on beta-blocker and statin along with Plavix which from strictly cardiac standpoint can be stopped since he is now on warfarin.  Will defer to vascular surgery for their opinion as to if it can be stopped.  Plan: Continue beta-blocker and statin.  Defer decision on Plavix continuance to vascular surgeon.  Continue Glyxambi for diabetes and cardiovascular benefit.      Essential hypertension (Chronic)    Blood pressure stable.  On beta-blocker, but would not try to be all that aggressive.      History of GI bleed (Chronic)    Will defer to vascular surgery about the decision for Plavix, but based on his history of GI bleed, would probably prefer to avoid double therapy.      Dyslipidemia (Chronic)    Lipids been well controlled on statin.  Most recent check showed well-controlled LDL less than 50.  Continue statin.      PAF (paroxysmal atrial fibrillation) (HCC) (Chronic)    As far as he can tell, he has not had any further episodes of A. fib since his MI.  He does not feel it regardless.  Was not in A. fib during his hospital stay, and would therefore argue against A. fib  being the etiology for his stroke.  Despite this, with that history, he has been placed on Eliquis.  Seems to be tolerating it relatively well from a bleeding standpoint.  He does have baseline beta-blocker for rate control. --> With him being on Eliquis, we can stop Plavix provided the vascular surgeons are okay with it.          COVID-19 Education: The signs and symptoms of COVID-19 were discussed with the patient and how to seek care for testing (follow up with PCP or arrange E-visit).   The importance of social distancing was discussed today.  I spent a total of 31minutes with the patient. >  50% of the time was spent in direct patient consultation.  Additional time spent with chart review  / charting (studies, outside notes, etc): 12 Total Time: 30 min   Current medicines are reviewed at length with the patient today.  (+/- concerns) none  Notice: This dictation was prepared with Dragon dictation along with smaller phrase technology. Any transcriptional errors that result from this process are unintentional and may not be corrected upon review.  Patient Instructions / Medication Changes & Studies & Tests Ordered   Patient Instructions  Medication Instructions:  NO CHANGES  WILL DISCUSS WITH SURGEON AND CONTACT ABOUT YOU ABOUT THE PLAVIX *If you need a refill on your cardiac medications before your next appointment, please call your pharmacy*   Lab Work: NOT NEEDED   Testing/Procedures: NOT NEEDED   Follow-Up: At [facility], you and your health needs are our priority.  As part of our continuing mission to  provide you with exceptional heart care, we have created designated Provider Care Teams.  These Care Teams include your primary Cardiologist (physician) and Advanced Practice Providers (APPs -  Physician Assistants and Nurse Practitioners) who all work together to provide you with the care you need, when you need it.  We recommend signing up for the patient  portal called "MyChart".  Sign up information is provided on this After Visit Summary.  MyChart is used to connect with patients for Virtual Visits (Telemedicine).  Patients are able to view lab/test results, encounter notes, upcoming appointments, etc.  Non-urgent messages can be sent to your provider as well.   To learn more about what you can do with MyChart, go to [email or web address].    Your next appointment:   6 month(s)  The format for your next appointment:   In Person  Provider:   Glenetta Hew, MD   Other Instructions N/A    Studies Ordered:   No orders of the defined types were placed in this encounter.    Glenetta Hew, M.D., M.S. Interventional Cardiologist   Pager # [phone] Phone # [phone] [address] [place], [place] [address]   Thank you for choosing Heart[facility] at [facility]!!

## 2020-01-11 NOTE — Patient Instructions (Signed)
Medication Instructions:  NO CHANGES  WILL DISCUSS WITH SURGEON AND CONTACT ABOUT YOU ABOUT THE PLAVIX *If you need a refill on your cardiac medications before your next appointment, please call your pharmacy*   Lab Work: NOT NEEDED   Testing/Procedures: NOT NEEDED   Follow-Up: At [facility], you and your health needs are our priority.  As part of our continuing mission to provide you with exceptional heart care, we have created designated Provider Care Teams.  These Care Teams include your primary Cardiologist (physician) and Advanced Practice Providers (APPs -  Physician Assistants and Nurse Practitioners) who all work together to provide you with the care you need, when you need it.  We recommend signing up for the patient portal called "MyChart".  Sign up information is provided on this After Visit Summary.  MyChart is used to connect with patients for Virtual Visits (Telemedicine).  Patients are able to view lab/test results, encounter notes, upcoming appointments, etc.  Non-urgent messages can be sent to your provider as well.   To learn more about what you can do with MyChart, go to [email or web address].    Your next appointment:   6 month(s)  The format for your next appointment:   In Person  Provider:   Glenetta Hew, MD   Other Instructions N/A

## 2020-01-14 ENCOUNTER — Encounter: Payer: Self-pay | Admitting: Cardiology

## 2020-01-14 NOTE — Assessment & Plan Note (Addendum)
Doing remarkably well following his PCI.  Has not had any further anginal symptoms. Is on beta-blocker and statin along with Plavix which from strictly cardiac standpoint can be stopped since he is now on warfarin.  Will defer to vascular surgery for their opinion as to if it can be stopped.  Plan: Continue beta-blocker and statin.  Defer decision on Plavix continuance to vascular surgeon.  Continue Glyxambi for diabetes and cardiovascular benefit.

## 2020-01-14 NOTE — Assessment & Plan Note (Signed)
As far as he can tell, he has not had any further episodes of A. fib since his MI.  He does not feel it regardless.  Was not in A. fib during his hospital stay, and would therefore argue against A. fib being the etiology for his stroke.  Despite this, with that history, he has been placed on Eliquis.  Seems to be tolerating it relatively well from a bleeding standpoint.  He does have baseline beta-blocker for rate control. --> With him being on Eliquis, we can stop Plavix provided the vascular surgeons are okay with it.

## 2020-01-14 NOTE — Assessment & Plan Note (Signed)
Lipids been well controlled on statin.  Most recent check showed well-controlled LDL less than 50.  Continue statin.

## 2020-01-14 NOTE — Assessment & Plan Note (Signed)
History of large anterior MI but with no further angina or heart failure symptoms.  EF seem to be pretty preserved with full recovery. Doing well on beta-blocker, statin.

## 2020-01-14 NOTE — Assessment & Plan Note (Signed)
Blood pressure stable.  On beta-blocker, but would not try to be all that aggressive.

## 2020-01-14 NOTE — Assessment & Plan Note (Signed)
Will defer to vascular surgery about the decision for Plavix, but based on his history of GI bleed, would probably prefer to avoid double therapy.

## 2020-01-19 ENCOUNTER — Ambulatory Visit: Payer: Medicare Other | Admitting: Cardiology

## 2020-01-21 ENCOUNTER — Telehealth: Payer: Self-pay | Admitting: Cardiology

## 2020-01-21 NOTE — Telephone Encounter (Signed)
New Message:   Daughter said Dr [name] was supposed to talk to Dr Carlis Abbott about pt stopping his blood thinner. She wants to know what was decided please?

## 2020-01-21 NOTE — Telephone Encounter (Signed)
Spoke with patient's daughter per DPR. Patient wanted to know if a decision has been made about his Plavix. Per last OV deferring to vascular surgery but daughter thought Dr. [name] was going to speak to Dr. Carlis Abbott and let her know the decision. Will route to MDs for review.

## 2020-01-21 NOTE — Telephone Encounter (Signed)
Fine for [name] to stop his plavix.  It has been more than 30 days since surgery.  Thanks,  [name]

## 2020-01-21 NOTE — Telephone Encounter (Signed)
Spoke with patient's daughter per DPR. Dr. Carlis Abbott with Vascular Surgery has given the okay to stop Plavix. Daughter updated and medication list updated.

## 2020-01-30 HISTORY — PX: TRANSTHORACIC ECHOCARDIOGRAM: SHX275

## 2020-01-31 ENCOUNTER — Encounter (HOSPITAL_BASED_OUTPATIENT_CLINIC_OR_DEPARTMENT_OTHER): Payer: Self-pay | Admitting: Emergency Medicine

## 2020-01-31 ENCOUNTER — Emergency Department (HOSPITAL_BASED_OUTPATIENT_CLINIC_OR_DEPARTMENT_OTHER): Payer: Medicare Other

## 2020-01-31 ENCOUNTER — Emergency Department (HOSPITAL_BASED_OUTPATIENT_CLINIC_OR_DEPARTMENT_OTHER)
Admission: EM | Admit: 2020-01-31 | Discharge: 2020-01-31 | Disposition: A | Discharge status: Home / Self Care | Payer: Medicare Other | Attending: Emergency Medicine | Admitting: Emergency Medicine

## 2020-01-31 ENCOUNTER — Other Ambulatory Visit: Payer: Self-pay

## 2020-01-31 DIAGNOSIS — R112 Nausea with vomiting, unspecified: Secondary | ICD-10-CM | POA: Diagnosis not present

## 2020-01-31 DIAGNOSIS — E119 Type 2 diabetes mellitus without complications: Secondary | ICD-10-CM | POA: Diagnosis not present

## 2020-01-31 DIAGNOSIS — K5730 Diverticulosis of large intestine without perforation or abscess without bleeding: Secondary | ICD-10-CM | POA: Diagnosis not present

## 2020-01-31 DIAGNOSIS — Z96641 Presence of right artificial hip joint: Secondary | ICD-10-CM | POA: Diagnosis not present

## 2020-01-31 DIAGNOSIS — Z8673 Personal history of transient ischemic attack (TIA), and cerebral infarction without residual deficits: Secondary | ICD-10-CM | POA: Insufficient documentation

## 2020-01-31 DIAGNOSIS — Z79899 Other long term (current) drug therapy: Secondary | ICD-10-CM | POA: Insufficient documentation

## 2020-01-31 DIAGNOSIS — R1084 Generalized abdominal pain: Secondary | ICD-10-CM | POA: Insufficient documentation

## 2020-01-31 DIAGNOSIS — R52 Pain, unspecified: Secondary | ICD-10-CM | POA: Diagnosis not present

## 2020-01-31 DIAGNOSIS — Z7901 Long term (current) use of anticoagulants: Secondary | ICD-10-CM | POA: Diagnosis not present

## 2020-01-31 DIAGNOSIS — I10 Essential (primary) hypertension: Secondary | ICD-10-CM | POA: Diagnosis not present

## 2020-01-31 DIAGNOSIS — E039 Hypothyroidism, unspecified: Secondary | ICD-10-CM | POA: Diagnosis not present

## 2020-01-31 DIAGNOSIS — Z794 Long term (current) use of insulin: Secondary | ICD-10-CM | POA: Insufficient documentation

## 2020-01-31 DIAGNOSIS — I252 Old myocardial infarction: Secondary | ICD-10-CM | POA: Insufficient documentation

## 2020-01-31 DIAGNOSIS — I2510 Atherosclerotic heart disease of native coronary artery without angina pectoris: Secondary | ICD-10-CM | POA: Diagnosis not present

## 2020-01-31 DIAGNOSIS — R110 Nausea: Secondary | ICD-10-CM | POA: Diagnosis not present

## 2020-01-31 LAB — CBC WITH DIFFERENTIAL/PLATELET
Abs Immature Granulocytes: 0.04 10*3/uL (ref 0.00–0.07)
Basophils Absolute: 0.1 10*3/uL (ref 0.0–0.1)
Basophils Relative: 0 %
Eosinophils Absolute: 0.2 10*3/uL (ref 0.0–0.5)
Eosinophils Relative: 1 %
HCT: 43.8 % (ref 39.0–52.0)
Hemoglobin: 15.4 g/dL (ref 13.0–17.0)
Immature Granulocytes: 0 %
Lymphocytes Relative: 17 %
Lymphs Abs: 2.2 10*3/uL (ref 0.7–4.0)
MCH: 32.9 pg (ref 26.0–34.0)
MCHC: 35.2 g/dL (ref 30.0–36.0)
MCV: 93.6 fL (ref 80.0–100.0)
Monocytes Absolute: 1.7 10*3/uL — ABNORMAL HIGH (ref 0.1–1.0)
Monocytes Relative: 13 %
Neutro Abs: 8.9 10*3/uL — ABNORMAL HIGH (ref 1.7–7.7)
Neutrophils Relative %: 69 %
Platelets: 285 10*3/uL (ref 150–400)
RBC: 4.68 MIL/uL (ref 4.22–5.81)
RDW: 13.3 % (ref 11.5–15.5)
WBC: 13.1 10*3/uL — ABNORMAL HIGH (ref 4.0–10.5)
nRBC: 0 % (ref 0.0–0.2)

## 2020-01-31 LAB — URINALYSIS, ROUTINE W REFLEX MICROSCOPIC
Bilirubin Urine: NEGATIVE
Glucose, UA: >=500 mg/dL — AB
Ketones, ur: NEGATIVE mg/dL
Leukocytes,Ua: NEGATIVE
Nitrite: NEGATIVE
Protein, ur: NEGATIVE mg/dL
Specific Gravity, Urine: 1.015 (ref 1.005–1.030)
pH: 6 (ref 5.0–8.0)

## 2020-01-31 LAB — COMPREHENSIVE METABOLIC PANEL
ALT: 25 U/L (ref 0–44)
AST: 35 U/L (ref 15–41)
Albumin: 4 g/dL (ref 3.5–5.0)
Alkaline Phosphatase: 65 U/L (ref 38–126)
Anion gap: 12 (ref 5–15)
BUN: 25 mg/dL — ABNORMAL HIGH (ref 8–23)
CO2: 21 mmol/L — ABNORMAL LOW (ref 22–32)
Calcium: 9.6 mg/dL (ref 8.9–10.3)
Chloride: 103 mmol/L (ref 98–111)
Creatinine, Ser: 0.99 mg/dL (ref 0.61–1.24)
GFR calc Af Amer: >60 mL/min (ref >60)
GFR calc non Af Amer: >60 mL/min (ref >60)
Glucose, Bld: 204 mg/dL — ABNORMAL HIGH (ref 70–99)
Potassium: 4 mmol/L (ref 3.5–5.1)
Sodium: 136 mmol/L (ref 135–145)
Total Bilirubin: 1 mg/dL (ref 0.3–1.2)
Total Protein: 7.1 g/dL (ref 6.5–8.1)

## 2020-01-31 LAB — URINALYSIS, MICROSCOPIC (REFLEX)

## 2020-01-31 LAB — LIPASE, BLOOD: Lipase: 31 U/L (ref 11–51)

## 2020-01-31 LAB — TROPONIN I (HIGH SENSITIVITY): Troponin I (High Sensitivity): 10 ng/L (ref <18)

## 2020-01-31 MED ORDER — IOHEXOL 300 MG/ML  SOLN
100.0000 mL | Freq: Once | INTRAMUSCULAR | Status: AC | PRN
  Administered 2020-01-31: 100 mL via INTRAVENOUS

## 2020-01-31 MED ORDER — ONDANSETRON HCL 4 MG/2ML IJ SOLN
4.0000 mg | Freq: Once | INTRAMUSCULAR | Status: AC
  Administered 2020-01-31: 4 mg via INTRAVENOUS
  Filled 2020-01-31: qty 2

## 2020-01-31 MED ORDER — OMEPRAZOLE 20 MG PO CPDR
20.0000 mg | DELAYED_RELEASE_CAPSULE | Freq: Two times a day (BID) | ORAL | 0 refills | Status: DC
Start: 2020-01-31 — End: 2020-06-01

## 2020-01-31 MED ORDER — METRONIDAZOLE 500 MG PO TABS
500.0000 mg | ORAL_TABLET | Freq: Two times a day (BID) | ORAL | 0 refills | Status: DC
Start: 2020-01-31 — End: 2020-04-12

## 2020-01-31 MED ORDER — SODIUM CHLORIDE 0.9 % IV BOLUS
500.0000 mL | Freq: Once | INTRAVENOUS | Status: AC
  Administered 2020-01-31: 500 mL via INTRAVENOUS

## 2020-01-31 NOTE — ED Notes (Signed)
Assisted pt to use urinal. Patient transported to CT

## 2020-01-31 NOTE — ED Notes (Signed)
Pt given Diet Gingerale at his request for PO challenge.

## 2020-01-31 NOTE — ED Provider Notes (Addendum)
[facility] EMERGENCY DEPARTMENT Provider Note   CSN: [id number] Arrival date & time: 01/31/20  1751     History Chief Complaint  Patient presents with  . Abdominal Pain  . Nausea    [name] is a 86 y.o. male.  Patient is a 86 year old male with past medical history of coronary artery disease with prior stent, diabetes, hypertension, duodenal ulcer, paroxysmal A. fib, and carotid endarterectomy.  He presents today for evaluation of nausea, decreased appetite, and abdominal discomfort.  This has been worsening over the past several days.  Patient states that he is having difficulty eating secondary to loss of appetite and discomfort.  He denies any fevers or chills.  He reports having a bowel movement yesterday which was normal and nonbloody.  He denies any urinary complaints.  The history is provided by the patient.  Abdominal Pain Pain location:  Generalized Pain quality: cramping   Pain radiates to:  Does not radiate Pain severity:  Moderate Onset quality:  Gradual Duration:  3 days Timing:  Constant Progression:  Worsening Chronicity:  New      Past Medical History:  Diagnosis Date  . Anemia   . Arthritis   . CAD S/P percutaneous coronary angioplasty    a. 11/2016: anterior STEMI s/p DES to LAD.   . Diabetes mellitus without complication ([place])    Diet controlled  . GERD (gastroesophageal reflux disease)   . GI bleed    10/2013: 2/2 duodenal ulcer. ASA/plavix discontinued.   . Hyperlipemia   . Hypertension   . Hypothyroidism   . PAF (paroxysmal atrial fibrillation) ([place])    a. noted during admission in 10/2013 for acute GI bleed. No recurrunce since. Monitor in 2014 with only PAC/PVCs  . Peripheral vascular disease ([place])   . ST elevation myocardial infarction (STEMI) involving left anterior descending (LAD) coronary artery with complication ([place]) [id number]   - 99% subtotal occlusion of LAD treated with DES stent.  . Stroke [facility])    a. ichemic CVA in  2012.     Patient Active Problem List   Diagnosis Date Noted  . Carotid artery stenosis 10/07/2019  . Acute right MCA stroke ([place]) 09/24/2019  . PAF (paroxysmal atrial fibrillation) ([place]) 09/24/2019  . Leukocytosis 09/24/2019  . ST elevation myocardial infarction (STEMI) involving left anterior descending (LAD) coronary artery without development of Q waves ([place]) 12/11/2016  . History of GI bleed 12/11/2016  . Dyslipidemia 12/11/2016  . Hypothyroidism 12/11/2016  . CAD S/P percutaneous coronary angioplasty 12/11/2016  . Acute esophagitis 09/28/2013  . Fatty liver 09/28/2013  . Atrial fibrillation with RVR ([place]) 09/28/2013  . Elevated lipase 09/28/2013  . Essential hypertension 09/28/2013  . Controlled diabetes mellitus type II without complication ([place]) [id number]  . Acute duodenal ulcer with bleeding 09/26/2013  . Pre-syncope 06/16/2013  . Acute blood loss anemia 04/29/2013  . S/P right THA, AA 04/28/2013  . History of stroke 12/12/2010    Past Surgical History:  Procedure Laterality Date  . CORONARY STENT INTERVENTION N/A 12/11/2016   Procedure: Coronary Stent Intervention-Mid LAD;  Surgeon: Leonie Man, MD;  Location: [facility] CV LAB;  Service: Cardiovascular:  mLAD 99%-0% Synergy DES 2.75 x 16 (3.0 mm)  . ENDARTERECTOMY Right 10/07/2019   Procedure: ENDARTERECTOMY CAROTID RIGHT;  Surgeon: Marty Heck, MD;  Location: [facility];  Service: Vascular;  Laterality: Right;  . ESOPHAGOGASTRODUODENOSCOPY N/A 09/23/2013   Procedure: ESOPHAGOGASTRODUODENOSCOPY (EGD);  Surgeon: Lafayette Dragon, MD;  Location: [facility] ENDOSCOPY;  Service: Endoscopy;  Laterality: N/A;  . ESOPHAGOGASTRODUODENOSCOPY N/A 09/25/2013   Procedure: ESOPHAGOGASTRODUODENOSCOPY (EGD);  Surgeon: Beryle Beams, MD;  Location: [facility] ENDOSCOPY;  Service: Endoscopy;  Laterality: N/A;  . ESOPHAGOGASTRODUODENOSCOPY N/A 12/18/2013   Procedure: ESOPHAGOGASTRODUODENOSCOPY (EGD);  Surgeon: Beryle Beams, MD;  Location: [facility]  ENDOS[facility];  Service: Endoscopy;  Laterality: N/A;  . LEFT HEART CATH AND CORONARY ANGIOGRAPHY N/A 12/11/2016   Procedure: Left Heart Cath and Coronary Angiography;  Surgeon: Leonie Man, MD;  Location: [facility] CV LAB;  Service: Cardiovascular: LM 45%. mLAD 99% (PCI), dLAD 40% & 50%, ostD1 - 50%. Ost-prox Cx 60%, OstOM1 60% (Med Rx).  Anteroapical HK - EF 50-55%. Normal LVEDP  . TOTAL HIP ARTHROPLASTY Right 04/28/2013   Procedure: RIGHT TOTAL HIP ARTHROPLASTY ANTERIOR APPROACH;  Surgeon: Mauri Pole, MD;  Location: [facility] ORS;  Service: Orthopedics;  Laterality: Right;  . TRANSTHORACIC ECHOCARDIOGRAM  12/12/2016   Status post anterior STEMI:  Normal LV size with normal function. EF 60 deficits 5% with no regional wall motion abnormality. GR 2 DD. Mild LA dilation.       No family history on file.  Social History   Tobacco Use  . Smoking status: Never Smoker  . Smokeless tobacco: Never Used  Substance Use Topics  . Alcohol use: Yes    Alcohol/week: 3.0 standard drinks    Types: 3 Shots of liquor per week    Comment: wine or vodka   . Drug use: No    Home Medications Prior to Admission medications   Medication Sig Start Date End Date Taking? Authorizing Provider  clopidogrel (PLAVIX) 75 MG tablet Take 75 mg by mouth daily.   Yes [provider]  ELIQUIS 5 MG TABS tablet TAKE 1 TABLET BY MOUTH TWICE DAILY. 10/26/19  Yes Leonie Man, MD  Empagliflozin-linaGLIPtin (GLYXAMBI) 10-5 MG TABS Take 1 tablet by mouth daily.    Yes [provider]  ergocalciferol (VITAMIN D2) 50000 UNITS capsule Take 50,000 Units by mouth once a week. Patient takes on Fridays   Yes [provider]  HYDROcodone-acetaminophen (NORCO/VICODIN) 5-325 MG tablet Take 1 tablet by mouth every 8 (eight) hours as needed for moderate pain. 10/08/19  Yes Rhyne, Samantha J, PA-C  LANTUS SOLOSTAR 100 UNIT/ML Solostar Pen Inject 15 Units into the skin daily.  04/23/18  Yes [provider]  levothyroxine (SYNTHROID, LEVOTHROID) 50 MCG tablet Take 50 mcg by mouth daily before breakfast.   Yes [provider]  metoprolol tartrate (LOPRESSOR) 25 MG tablet Take 1 tablet (25 mg total) by mouth 2 (two) times daily. 04/10/19  Yes Leonie Man, MD  nitroGLYCERIN (NITROSTAT) 0.4 MG SL tablet Place 1 tablet (0.4 mg total) under the tongue every 5 (five) minutes x 3 doses as needed for chest pain. 12/13/16  Yes Eileen Stanford, PA-C  pantoprazole (PROTONIX) 40 MG tablet TAKE 1 TABLET ONCE DAILY. 12/11/19  Yes Leonie Man, MD  rosuvastatin (CRESTOR) 20 MG tablet Take 20 mg by mouth every morning.    Yes [provider]    Allergies    Adhesive [tape]  Review of Systems   Review of Systems  Gastrointestinal: Positive for abdominal pain.  All other systems reviewed and are negative.   Physical Exam Updated Vital Signs BP (!) 184/95 (BP Location: Right Arm)   Pulse 75   Temp 98.1 F (36.7 C) (Oral)   Resp 18   Ht 5\' 9"  (1.753 m)   Wt 73.9 kg  SpO2 97%   BMI 24.07 kg/m   Physical Exam Vitals and nursing note reviewed.  Constitutional:      General: He is not in acute distress.    Appearance: He is well-developed. He is not diaphoretic.  HENT:     Head: Normocephalic and atraumatic.  Cardiovascular:     Rate and Rhythm: Normal rate and regular rhythm.     Heart sounds: No murmur. No friction rub.  Pulmonary:     Effort: Pulmonary effort is normal. No respiratory distress.     Breath sounds: Normal breath sounds. No wheezing or rales.  Abdominal:     General: Bowel sounds are normal. There is no distension.     Palpations: Abdomen is soft.     Tenderness: There is generalized abdominal tenderness. There is no right CVA tenderness, left CVA tenderness, guarding or rebound.  Musculoskeletal:        General: Normal range of motion.     Cervical back: Normal range of motion and neck supple.  Skin:    General: Skin is warm and dry.    Neurological:     Mental Status: He is alert and oriented to person, place, and time.     Coordination: Coordination normal.     ED Results / Procedures / Treatments   Labs (all labs ordered are listed, but only abnormal results are displayed) Labs Reviewed  COMPREHENSIVE METABOLIC PANEL  LIPASE, BLOOD  CBC WITH DIFFERENTIAL/PLATELET  URINALYSIS, ROUTINE W REFLEX MICROSCOPIC  TROPONIN I (HIGH SENSITIVITY)    EKG EKG Interpretation  Date/Time:  Sunday Jan 31 2020 18:53:50 EDT Ventricular Rate:  69 PR Interval:    QRS Duration: 80 QT Interval:  489 QTC Calculation: 524 R Axis:   69 Text Interpretation: Sinus rhythm Prolonged PR interval Minimal ST depression, lateral leads Prolonged QT interval No significant change since 09/22/2020 Confirmed by Veryl Speak [phone]) on 01/31/2020 8:52:21 PM   Radiology No results found.  Procedures Procedures (including critical care time)  Medications Ordered in ED Medications  sodium chloride 0.9 % bolus 500 mL (has no administration in time range)  ondansetron (ZOFRAN) injection 4 mg (has no administration in time range)    ED Course  I have reviewed the triage vital signs and the nursing notes.  Pertinent labs & imaging results that were available during my care of the patient were reviewed by me and considered in my medical decision making (see chart for details).    MDM Rules/Calculators/A&P  Patient is a 86 year old male presenting with complaints of generalized abdominal discomfort and distention with eating.  This has been ongoing for the past several days.  This has made him not want to eat.   Vitals here are stable and physical examination is essentially unremarkable.  He has mild tenderness throughout the abdomen, but no peritoneal signs.  Work-up shows no significant laboratory study abnormalities.  Patient did undergo a CT scan which shows duodenitis/enteritis, but otherwise no acute process.  Patient will be treated  with Prilosec and Flagyl and see if he improves.  He is to follow-up with primary doctor if not improving in the next few days.  Final Clinical Impression(s) / ED Diagnoses Final diagnoses:  None    Rx / DC Orders ED Discharge Orders    None       Veryl Speak, MD 01/31/20 2046    Veryl Speak, MD 01/31/20 2052

## 2020-01-31 NOTE — ED Triage Notes (Signed)
Pt to ED via [facility] with c/o general abd discomfort x 3d; NVD yesterday; nausea continues today - pt sts it is much worse if he lies flat

## 2020-01-31 NOTE — ED Notes (Signed)
Pt called for RN after vomiting. Pt and bed cleaned and EDP made aware and orders given.

## 2020-01-31 NOTE — Discharge Instructions (Signed)
Begin taking flagyl and prilosec as prescribed tonight.    Follow-up with primary doctor if symptoms or not improving in the next few days.

## 2020-02-03 DIAGNOSIS — E1122 Type 2 diabetes mellitus with diabetic chronic kidney disease: Secondary | ICD-10-CM | POA: Diagnosis present

## 2020-02-03 DIAGNOSIS — Z8673 Personal history of transient ischemic attack (TIA), and cerebral infarction without residual deficits: Secondary | ICD-10-CM | POA: Diagnosis not present

## 2020-02-03 DIAGNOSIS — D62 Acute posthemorrhagic anemia: Secondary | ICD-10-CM | POA: Diagnosis present

## 2020-02-03 DIAGNOSIS — Z794 Long term (current) use of insulin: Secondary | ICD-10-CM | POA: Diagnosis not present

## 2020-02-03 DIAGNOSIS — R000 Tachycardia, unspecified: Secondary | ICD-10-CM | POA: Diagnosis not present

## 2020-02-03 DIAGNOSIS — R578 Other shock: Secondary | ICD-10-CM | POA: Diagnosis present

## 2020-02-03 DIAGNOSIS — E119 Type 2 diabetes mellitus without complications: Secondary | ICD-10-CM | POA: Diagnosis not present

## 2020-02-03 DIAGNOSIS — I214 Non-ST elevation (NSTEMI) myocardial infarction: Secondary | ICD-10-CM | POA: Diagnosis not present

## 2020-02-03 DIAGNOSIS — Z7401 Bed confinement status: Secondary | ICD-10-CM | POA: Diagnosis not present

## 2020-02-03 DIAGNOSIS — I25118 Atherosclerotic heart disease of native coronary artery with other forms of angina pectoris: Secondary | ICD-10-CM | POA: Diagnosis not present

## 2020-02-03 DIAGNOSIS — E1159 Type 2 diabetes mellitus with other circulatory complications: Secondary | ICD-10-CM | POA: Diagnosis not present

## 2020-02-03 DIAGNOSIS — M79642 Pain in left hand: Secondary | ICD-10-CM | POA: Diagnosis not present

## 2020-02-03 DIAGNOSIS — K264 Chronic or unspecified duodenal ulcer with hemorrhage: Secondary | ICD-10-CM | POA: Diagnosis present

## 2020-02-03 DIAGNOSIS — Z955 Presence of coronary angioplasty implant and graft: Secondary | ICD-10-CM | POA: Diagnosis not present

## 2020-02-03 DIAGNOSIS — E1151 Type 2 diabetes mellitus with diabetic peripheral angiopathy without gangrene: Secondary | ICD-10-CM | POA: Diagnosis present

## 2020-02-03 DIAGNOSIS — D72829 Elevated white blood cell count, unspecified: Secondary | ICD-10-CM | POA: Diagnosis not present

## 2020-02-03 DIAGNOSIS — Z9889 Other specified postprocedural states: Secondary | ICD-10-CM | POA: Diagnosis not present

## 2020-02-03 DIAGNOSIS — D696 Thrombocytopenia, unspecified: Secondary | ICD-10-CM | POA: Diagnosis present

## 2020-02-03 DIAGNOSIS — R531 Weakness: Secondary | ICD-10-CM | POA: Diagnosis not present

## 2020-02-03 DIAGNOSIS — E785 Hyperlipidemia, unspecified: Secondary | ICD-10-CM | POA: Diagnosis not present

## 2020-02-03 DIAGNOSIS — K2980 Duodenitis without bleeding: Secondary | ICD-10-CM | POA: Diagnosis not present

## 2020-02-03 DIAGNOSIS — K5900 Constipation, unspecified: Secondary | ICD-10-CM | POA: Diagnosis not present

## 2020-02-03 DIAGNOSIS — K2981 Duodenitis with bleeding: Secondary | ICD-10-CM | POA: Diagnosis present

## 2020-02-03 DIAGNOSIS — E1165 Type 2 diabetes mellitus with hyperglycemia: Secondary | ICD-10-CM | POA: Diagnosis present

## 2020-02-03 DIAGNOSIS — N390 Urinary tract infection, site not specified: Secondary | ICD-10-CM | POA: Diagnosis not present

## 2020-02-03 DIAGNOSIS — M8949 Other hypertrophic osteoarthropathy, multiple sites: Secondary | ICD-10-CM | POA: Diagnosis not present

## 2020-02-03 DIAGNOSIS — M2550 Pain in unspecified joint: Secondary | ICD-10-CM | POA: Diagnosis not present

## 2020-02-03 DIAGNOSIS — I480 Paroxysmal atrial fibrillation: Secondary | ICD-10-CM | POA: Diagnosis not present

## 2020-02-03 DIAGNOSIS — I6529 Occlusion and stenosis of unspecified carotid artery: Secondary | ICD-10-CM | POA: Diagnosis not present

## 2020-02-03 DIAGNOSIS — I700 Atherosclerosis of aorta: Secondary | ICD-10-CM | POA: Diagnosis not present

## 2020-02-03 DIAGNOSIS — I21A1 Myocardial infarction type 2: Secondary | ICD-10-CM | POA: Diagnosis not present

## 2020-02-03 DIAGNOSIS — I959 Hypotension, unspecified: Secondary | ICD-10-CM | POA: Diagnosis not present

## 2020-02-03 DIAGNOSIS — E7800 Pure hypercholesterolemia, unspecified: Secondary | ICD-10-CM | POA: Diagnosis not present

## 2020-02-03 DIAGNOSIS — Z20822 Contact with and (suspected) exposure to covid-19: Secondary | ICD-10-CM | POA: Diagnosis present

## 2020-02-03 DIAGNOSIS — E1129 Type 2 diabetes mellitus with other diabetic kidney complication: Secondary | ICD-10-CM | POA: Diagnosis not present

## 2020-02-03 DIAGNOSIS — K922 Gastrointestinal hemorrhage, unspecified: Secondary | ICD-10-CM | POA: Diagnosis not present

## 2020-02-03 DIAGNOSIS — R58 Hemorrhage, not elsewhere classified: Secondary | ICD-10-CM | POA: Diagnosis not present

## 2020-02-03 DIAGNOSIS — K5790 Diverticulosis of intestine, part unspecified, without perforation or abscess without bleeding: Secondary | ICD-10-CM | POA: Diagnosis not present

## 2020-02-03 DIAGNOSIS — E782 Mixed hyperlipidemia: Secondary | ICD-10-CM | POA: Diagnosis present

## 2020-02-03 DIAGNOSIS — R109 Unspecified abdominal pain: Secondary | ICD-10-CM | POA: Diagnosis not present

## 2020-02-03 DIAGNOSIS — I2510 Atherosclerotic heart disease of native coronary artery without angina pectoris: Secondary | ICD-10-CM | POA: Diagnosis present

## 2020-02-03 DIAGNOSIS — K921 Melena: Secondary | ICD-10-CM | POA: Diagnosis not present

## 2020-02-03 DIAGNOSIS — R739 Hyperglycemia, unspecified: Secondary | ICD-10-CM | POA: Diagnosis not present

## 2020-02-03 DIAGNOSIS — E039 Hypothyroidism, unspecified: Secondary | ICD-10-CM | POA: Diagnosis present

## 2020-02-03 DIAGNOSIS — I493 Ventricular premature depolarization: Secondary | ICD-10-CM | POA: Diagnosis not present

## 2020-02-03 DIAGNOSIS — K2210 Ulcer of esophagus without bleeding: Secondary | ICD-10-CM | POA: Diagnosis present

## 2020-02-03 DIAGNOSIS — I4819 Other persistent atrial fibrillation: Secondary | ICD-10-CM | POA: Diagnosis not present

## 2020-02-03 DIAGNOSIS — Z48815 Encounter for surgical aftercare following surgery on the digestive system: Secondary | ICD-10-CM | POA: Diagnosis not present

## 2020-02-03 DIAGNOSIS — N189 Chronic kidney disease, unspecified: Secondary | ICD-10-CM | POA: Diagnosis present

## 2020-02-03 DIAGNOSIS — K269 Duodenal ulcer, unspecified as acute or chronic, without hemorrhage or perforation: Secondary | ICD-10-CM | POA: Diagnosis not present

## 2020-02-03 DIAGNOSIS — I517 Cardiomegaly: Secondary | ICD-10-CM | POA: Diagnosis not present

## 2020-02-03 DIAGNOSIS — Z79899 Other long term (current) drug therapy: Secondary | ICD-10-CM | POA: Diagnosis not present

## 2020-02-03 DIAGNOSIS — I482 Chronic atrial fibrillation, unspecified: Secondary | ICD-10-CM | POA: Diagnosis present

## 2020-02-03 DIAGNOSIS — I10 Essential (primary) hypertension: Secondary | ICD-10-CM | POA: Diagnosis not present

## 2020-02-03 DIAGNOSIS — I081 Rheumatic disorders of both mitral and tricuspid valves: Secondary | ICD-10-CM | POA: Diagnosis not present

## 2020-02-03 DIAGNOSIS — I4891 Unspecified atrial fibrillation: Secondary | ICD-10-CM | POA: Diagnosis not present

## 2020-02-03 DIAGNOSIS — M1990 Unspecified osteoarthritis, unspecified site: Secondary | ICD-10-CM | POA: Diagnosis present

## 2020-02-03 DIAGNOSIS — R404 Transient alteration of awareness: Secondary | ICD-10-CM | POA: Diagnosis not present

## 2020-02-03 DIAGNOSIS — I252 Old myocardial infarction: Secondary | ICD-10-CM | POA: Diagnosis not present

## 2020-02-03 DIAGNOSIS — I129 Hypertensive chronic kidney disease with stage 1 through stage 4 chronic kidney disease, or unspecified chronic kidney disease: Secondary | ICD-10-CM | POA: Diagnosis present

## 2020-02-03 DIAGNOSIS — Z7901 Long term (current) use of anticoagulants: Secondary | ICD-10-CM | POA: Diagnosis not present

## 2020-02-03 DIAGNOSIS — Z8711 Personal history of peptic ulcer disease: Secondary | ICD-10-CM | POA: Diagnosis not present

## 2020-02-08 DIAGNOSIS — K5790 Diverticulosis of intestine, part unspecified, without perforation or abscess without bleeding: Secondary | ICD-10-CM | POA: Diagnosis not present

## 2020-02-08 DIAGNOSIS — Z794 Long term (current) use of insulin: Secondary | ICD-10-CM | POA: Diagnosis not present

## 2020-02-08 DIAGNOSIS — Z7901 Long term (current) use of anticoagulants: Secondary | ICD-10-CM | POA: Diagnosis not present

## 2020-02-08 DIAGNOSIS — Z9889 Other specified postprocedural states: Secondary | ICD-10-CM | POA: Diagnosis not present

## 2020-02-08 DIAGNOSIS — E1159 Type 2 diabetes mellitus with other circulatory complications: Secondary | ICD-10-CM | POA: Diagnosis not present

## 2020-02-08 DIAGNOSIS — R531 Weakness: Secondary | ICD-10-CM | POA: Diagnosis not present

## 2020-02-08 DIAGNOSIS — I480 Paroxysmal atrial fibrillation: Secondary | ICD-10-CM | POA: Diagnosis not present

## 2020-02-08 DIAGNOSIS — K264 Chronic or unspecified duodenal ulcer with hemorrhage: Secondary | ICD-10-CM | POA: Diagnosis not present

## 2020-02-08 DIAGNOSIS — E785 Hyperlipidemia, unspecified: Secondary | ICD-10-CM | POA: Diagnosis not present

## 2020-02-08 DIAGNOSIS — I25118 Atherosclerotic heart disease of native coronary artery with other forms of angina pectoris: Secondary | ICD-10-CM | POA: Diagnosis not present

## 2020-02-08 DIAGNOSIS — Z8673 Personal history of transient ischemic attack (TIA), and cerebral infarction without residual deficits: Secondary | ICD-10-CM | POA: Diagnosis not present

## 2020-02-08 DIAGNOSIS — K922 Gastrointestinal hemorrhage, unspecified: Secondary | ICD-10-CM | POA: Diagnosis not present

## 2020-02-08 DIAGNOSIS — E7800 Pure hypercholesterolemia, unspecified: Secondary | ICD-10-CM | POA: Diagnosis not present

## 2020-02-08 DIAGNOSIS — D62 Acute posthemorrhagic anemia: Secondary | ICD-10-CM | POA: Diagnosis not present

## 2020-02-08 DIAGNOSIS — I493 Ventricular premature depolarization: Secondary | ICD-10-CM | POA: Diagnosis not present

## 2020-02-08 DIAGNOSIS — K2981 Duodenitis with bleeding: Secondary | ICD-10-CM | POA: Diagnosis not present

## 2020-02-08 DIAGNOSIS — M2550 Pain in unspecified joint: Secondary | ICD-10-CM | POA: Diagnosis not present

## 2020-02-08 DIAGNOSIS — I214 Non-ST elevation (NSTEMI) myocardial infarction: Secondary | ICD-10-CM | POA: Diagnosis not present

## 2020-02-08 DIAGNOSIS — I4891 Unspecified atrial fibrillation: Secondary | ICD-10-CM | POA: Diagnosis not present

## 2020-02-08 DIAGNOSIS — Z955 Presence of coronary angioplasty implant and graft: Secondary | ICD-10-CM | POA: Diagnosis not present

## 2020-02-08 DIAGNOSIS — K269 Duodenal ulcer, unspecified as acute or chronic, without hemorrhage or perforation: Secondary | ICD-10-CM | POA: Diagnosis not present

## 2020-02-08 DIAGNOSIS — R58 Hemorrhage, not elsewhere classified: Secondary | ICD-10-CM | POA: Diagnosis not present

## 2020-02-08 DIAGNOSIS — I2510 Atherosclerotic heart disease of native coronary artery without angina pectoris: Secondary | ICD-10-CM | POA: Diagnosis not present

## 2020-02-08 DIAGNOSIS — E039 Hypothyroidism, unspecified: Secondary | ICD-10-CM | POA: Diagnosis not present

## 2020-02-08 DIAGNOSIS — Z48815 Encounter for surgical aftercare following surgery on the digestive system: Secondary | ICD-10-CM | POA: Diagnosis not present

## 2020-02-08 DIAGNOSIS — M8949 Other hypertrophic osteoarthropathy, multiple sites: Secondary | ICD-10-CM | POA: Diagnosis not present

## 2020-02-08 DIAGNOSIS — I482 Chronic atrial fibrillation, unspecified: Secondary | ICD-10-CM | POA: Diagnosis not present

## 2020-02-08 DIAGNOSIS — I517 Cardiomegaly: Secondary | ICD-10-CM | POA: Diagnosis not present

## 2020-02-08 DIAGNOSIS — Z7401 Bed confinement status: Secondary | ICD-10-CM | POA: Diagnosis not present

## 2020-02-08 DIAGNOSIS — I10 Essential (primary) hypertension: Secondary | ICD-10-CM | POA: Diagnosis not present

## 2020-02-08 DIAGNOSIS — I081 Rheumatic disorders of both mitral and tricuspid valves: Secondary | ICD-10-CM | POA: Diagnosis not present

## 2020-02-08 DIAGNOSIS — I959 Hypotension, unspecified: Secondary | ICD-10-CM | POA: Diagnosis not present

## 2020-02-08 DIAGNOSIS — I6529 Occlusion and stenosis of unspecified carotid artery: Secondary | ICD-10-CM | POA: Diagnosis not present

## 2020-02-08 MED ORDER — ONDANSETRON HCL 4 MG/2ML IJ SOLN
4.00 | INTRAMUSCULAR | Status: DC
Start: ? — End: 2020-02-08

## 2020-02-08 MED ORDER — LEVOTHYROXINE SODIUM 50 MCG PO TABS
50.00 | ORAL_TABLET | ORAL | Status: DC
Start: 2020-02-09 — End: 2020-02-08

## 2020-02-08 MED ORDER — GLUCAGON (RDNA) 1 MG IJ KIT
1.00 | PACK | INTRAMUSCULAR | Status: DC
Start: ? — End: 2020-02-08

## 2020-02-08 MED ORDER — PANTOPRAZOLE SODIUM 40 MG PO TBEC
40.00 | DELAYED_RELEASE_TABLET | ORAL | Status: DC
Start: 2020-02-08 — End: 2020-02-08

## 2020-02-08 MED ORDER — ATORVASTATIN CALCIUM 10 MG PO TABS
20.00 | ORAL_TABLET | ORAL | Status: DC
Start: 2020-02-09 — End: 2020-02-08

## 2020-02-08 MED ORDER — HYDROCODONE-ACETAMINOPHEN 10-325 MG PO TABS
1.00 | ORAL_TABLET | ORAL | Status: DC
Start: ? — End: 2020-02-08

## 2020-02-08 MED ORDER — DICLOFENAC SODIUM 1 % EX GEL
2.00 | CUTANEOUS | Status: DC
Start: 2020-02-08 — End: 2020-02-08

## 2020-02-08 MED ORDER — DEXTROSE 10 % IV SOLN
125.00 | INTRAVENOUS | Status: DC
Start: ? — End: 2020-02-08

## 2020-02-08 MED ORDER — GLUCOSE 40 % PO GEL
15.00 | ORAL | Status: DC
Start: ? — End: 2020-02-08

## 2020-02-08 MED ORDER — MORPHINE SULFATE 2 MG/ML IJ SOLN
1.00 | INTRAMUSCULAR | Status: DC
Start: ? — End: 2020-02-08

## 2020-02-08 MED ORDER — GENERIC EXTERNAL MEDICATION
2.00 | Status: DC
Start: 2020-02-09 — End: 2020-02-08

## 2020-02-08 MED ORDER — SODIUM CHLORIDE FLUSH 0.9 % IV SOLN
5.00 | INTRAVENOUS | Status: DC
Start: 2020-02-08 — End: 2020-02-08

## 2020-02-08 MED ORDER — METOPROLOL TARTRATE 50 MG PO TABS
50.00 | ORAL_TABLET | ORAL | Status: DC
Start: 2020-02-08 — End: 2020-02-08

## 2020-02-08 MED ORDER — INSULIN LISPRO 100 UNIT/ML ~~LOC~~ SOLN
2.00 | SUBCUTANEOUS | Status: DC
Start: 2020-02-09 — End: 2020-02-08

## 2020-02-08 MED ORDER — SODIUM CHLORIDE FLUSH 0.9 % IV SOLN
5.00 | INTRAVENOUS | Status: DC
Start: ? — End: 2020-02-08

## 2020-02-08 MED ORDER — NITROGLYCERIN 0.4 MG SL SUBL
0.40 | SUBLINGUAL_TABLET | SUBLINGUAL | Status: DC
Start: ? — End: 2020-02-08

## 2020-02-13 DIAGNOSIS — R531 Weakness: Secondary | ICD-10-CM | POA: Diagnosis not present

## 2020-02-13 DIAGNOSIS — K922 Gastrointestinal hemorrhage, unspecified: Secondary | ICD-10-CM | POA: Diagnosis not present

## 2020-02-13 DIAGNOSIS — I480 Paroxysmal atrial fibrillation: Secondary | ICD-10-CM | POA: Diagnosis not present

## 2020-02-13 DIAGNOSIS — D62 Acute posthemorrhagic anemia: Secondary | ICD-10-CM | POA: Diagnosis not present

## 2020-02-13 DIAGNOSIS — I214 Non-ST elevation (NSTEMI) myocardial infarction: Secondary | ICD-10-CM | POA: Diagnosis not present

## 2020-02-24 ENCOUNTER — Other Ambulatory Visit: Payer: Self-pay | Admitting: *Deleted

## 2020-02-24 DIAGNOSIS — I10 Essential (primary) hypertension: Secondary | ICD-10-CM

## 2020-02-24 NOTE — Patient Outreach (Signed)
Member screened for potential [facility] Care Management needs as a benefit of [place] [employer].  Mr. Rivers is receiving skilled therapy at [facility].   Telephone call made to daughter/DPR/HCPOA Heron Sabins [phone] to discuss [facility] follow up. Patient identifiers confirmed.  [name] reports member is slated to transition from [facility] to [facility] on tomorrow 02/25/20. States member will have [facility] home health therapy in his ILF apartment. [name] has arranged paid caregiver assistance thru [employer]. States private caregiver will come daily at 830am for 3 hrs and in the evening will take member to the dining room for dinner. [name] states she is still in the process of getting arrangements made.  Explained [facility] program services. [name] is agreeable. States she should be contacted post SNF. States member is hard of hearing and will not wear his hearing aides.   Discussed [facility] services for home visits. [name] states she is unsure if Remote [facility] is needed at this time. However, she remains agreeable to [facility] telephonic follow up.   Confirmed best contact number for Heron Sabins is [phone].   Will make referral to [facility] for care coordination and complex case management.  Mr. Singleton has medical history of DM, CAD, HLD, HTN, atrial fib, CVA.  Marthenia Rolling, MSN-Ed, RN,BSN [place] Acute Care Coordinator [phone] [facility]) [phone]  (Toll free office)

## 2020-02-26 ENCOUNTER — Other Ambulatory Visit: Payer: Self-pay

## 2020-02-26 DIAGNOSIS — M6281 Muscle weakness (generalized): Secondary | ICD-10-CM | POA: Diagnosis not present

## 2020-02-26 DIAGNOSIS — J189 Pneumonia, unspecified organism: Secondary | ICD-10-CM | POA: Diagnosis not present

## 2020-02-26 DIAGNOSIS — R2689 Other abnormalities of gait and mobility: Secondary | ICD-10-CM | POA: Diagnosis not present

## 2020-02-26 NOTE — Patient Outreach (Signed)
[facility]) Care Management  02/26/2020  [name] 1924-01-28 [id number]   New referral.  Patient lives at [facility] independent living. Just discharged from [facility] rehab.  Daughter Noah Charon is HCPOA and contact person due to hearing issues with patient.    I placed call to daughter who reports her dad is doing great. Reports caregivers are with him for 3 hours in the morning and a few hours in the evening. Reports she dad was laughing today and feeling good. Daughter reports she has spoken with Dr. Forde Dandy and reviewed medication list.     Daughter reports she is not sure patient needs this service and would like a phone call next week after she has time to determine needs.   PLAN: will follow up with daughter on 03/02/2020.Patient to remain in pending status at this time.   Tomasa Rand, RN, BSN, CEN [facility] [employer] [phone]

## 2020-03-02 ENCOUNTER — Other Ambulatory Visit: Payer: Self-pay

## 2020-03-02 DIAGNOSIS — I639 Cerebral infarction, unspecified: Secondary | ICD-10-CM | POA: Diagnosis not present

## 2020-03-02 DIAGNOSIS — E038 Other specified hypothyroidism: Secondary | ICD-10-CM | POA: Diagnosis not present

## 2020-03-02 DIAGNOSIS — E7849 Other hyperlipidemia: Secondary | ICD-10-CM | POA: Diagnosis not present

## 2020-03-02 DIAGNOSIS — E559 Vitamin D deficiency, unspecified: Secondary | ICD-10-CM | POA: Diagnosis not present

## 2020-03-02 DIAGNOSIS — N1831 Chronic kidney disease, stage 3a: Secondary | ICD-10-CM | POA: Diagnosis not present

## 2020-03-02 DIAGNOSIS — E1151 Type 2 diabetes mellitus with diabetic peripheral angiopathy without gangrene: Secondary | ICD-10-CM | POA: Diagnosis not present

## 2020-03-02 DIAGNOSIS — Z8711 Personal history of peptic ulcer disease: Secondary | ICD-10-CM | POA: Diagnosis not present

## 2020-03-02 DIAGNOSIS — I10 Essential (primary) hypertension: Secondary | ICD-10-CM | POA: Diagnosis not present

## 2020-03-02 DIAGNOSIS — I252 Old myocardial infarction: Secondary | ICD-10-CM | POA: Diagnosis not present

## 2020-03-02 DIAGNOSIS — M25561 Pain in right knee: Secondary | ICD-10-CM | POA: Diagnosis not present

## 2020-03-02 DIAGNOSIS — I2510 Atherosclerotic heart disease of native coronary artery without angina pectoris: Secondary | ICD-10-CM | POA: Diagnosis not present

## 2020-03-03 NOTE — Patient Outreach (Signed)
[facility]) Care Management  03/03/2020  [name] [birth year]. [id number]   Telephone assessment/case closure:  Placed follow up call to daughter [name], who states patient is doing really well. Reports he is getting his strength back .  Daughter reports patient remains active with PT, [employer] [facility] once a day in the evenings. Daughter reports patient is seeing Dr. Forde Dandy today for medication review.   Daughter reports she does not think patient needs any additional services.   Offered to mail letter with my contact information and daughter agreed but wishes letter to be mailed to her home address of;   [address] [place] [id number].  PLAN: close case as no needs identified. Daughter will call me for future needs.  Tomasa Rand, RN, BSN, CEN [facility] [employer] [phone]

## 2020-03-08 ENCOUNTER — Telehealth: Payer: Self-pay | Admitting: Cardiology

## 2020-03-08 NOTE — Telephone Encounter (Signed)
Will forward to Dr [name] to review Appears pt was discharged mid May Pt was admitted with GI bleed and eliquis was held Pt daughter calling to see if this should be restarted./cy  Pt has appt mid July with Dr [name] .

## 2020-03-08 NOTE — Telephone Encounter (Signed)
Pt c/o medication issue:  1. Name of Medication: ELIQUIS 5 MG TABS tablet  2. How are you currently taking this medication (dosage and times per day)? Not currently taking medication  3. Are you having a reaction (difficulty breathing--STAT)? No   4. What is your medication issue? [name] was taken off of Eliquis while in the hospital, but was never advised on when he should go back on it. [name] is calling wanting to know when Dr. [place] would like for him to start taking it again. Please advise.

## 2020-03-08 NOTE — Telephone Encounter (Signed)
To be honest.  It looks like they restart his Plavix on discharge.  Moving forward, I would probably preferentially just have him on Eliquis without Plavix.  But for now would simply just continue with Plavix until we know for sure his blood counts are stable.  Glenetta Hew, MD

## 2020-03-08 NOTE — Telephone Encounter (Signed)
Pt's daughter aware of Dr Allison Quarry recommendations and agrees with plan .Per daughter pt is in process of seeing GI to eval and tx GI bleed

## 2020-03-21 ENCOUNTER — Other Ambulatory Visit: Payer: Self-pay | Admitting: Cardiology

## 2020-03-30 ENCOUNTER — Encounter: Payer: Self-pay | Admitting: Internal Medicine

## 2020-03-31 DIAGNOSIS — M6281 Muscle weakness (generalized): Secondary | ICD-10-CM | POA: Diagnosis not present

## 2020-03-31 DIAGNOSIS — R2689 Other abnormalities of gait and mobility: Secondary | ICD-10-CM | POA: Diagnosis not present

## 2020-03-31 DIAGNOSIS — J189 Pneumonia, unspecified organism: Secondary | ICD-10-CM | POA: Diagnosis not present

## 2020-04-12 ENCOUNTER — Encounter: Payer: Self-pay | Admitting: Cardiology

## 2020-04-12 ENCOUNTER — Ambulatory Visit (INDEPENDENT_AMBULATORY_CARE_PROVIDER_SITE_OTHER): Payer: Medicare Other | Admitting: Cardiology

## 2020-04-12 ENCOUNTER — Other Ambulatory Visit: Payer: Self-pay

## 2020-04-12 VITALS — BP 142/66 | HR 65 | Ht 69.0 in | Wt 159.4 lb

## 2020-04-12 DIAGNOSIS — I10 Essential (primary) hypertension: Secondary | ICD-10-CM | POA: Diagnosis not present

## 2020-04-12 DIAGNOSIS — I2510 Atherosclerotic heart disease of native coronary artery without angina pectoris: Secondary | ICD-10-CM | POA: Diagnosis not present

## 2020-04-12 DIAGNOSIS — Z8719 Personal history of other diseases of the digestive system: Secondary | ICD-10-CM

## 2020-04-12 DIAGNOSIS — E1169 Type 2 diabetes mellitus with other specified complication: Secondary | ICD-10-CM

## 2020-04-12 DIAGNOSIS — E785 Hyperlipidemia, unspecified: Secondary | ICD-10-CM | POA: Diagnosis not present

## 2020-04-12 DIAGNOSIS — Z9861 Coronary angioplasty status: Secondary | ICD-10-CM

## 2020-04-12 DIAGNOSIS — I480 Paroxysmal atrial fibrillation: Secondary | ICD-10-CM | POA: Diagnosis not present

## 2020-04-12 NOTE — Patient Instructions (Addendum)
Medication Instructions:  No changes   If you notice blood in your urine or stool you may stop Clopidogrel until it stops   *If you need a refill on your cardiac medications before your next appointment, please call your pharmacy*   Lab Work: Not needed If you have labs (blood work) drawn today and your tests are completely normal, you will receive your results only by: Marland Kitchen MyChart Message (if you have MyChart) OR . A paper copy in the mail If you have any lab test that is abnormal or we need to change your treatment, we will call you to review the results.   Testing/Procedures: Not needed   Follow-Up: At [facility], you and your health needs are our priority.  As part of our continuing mission to provide you with exceptional heart care, we have created designated Provider Care Teams.  These Care Teams include your primary Cardiologist (physician) and Advanced Practice Providers (APPs -  Physician Assistants and Nurse Practitioners) who all work together to provide you with the care you need, when you need it.      Your next appointment:   6 month(s)  The format for your next appointment:   In Person  Provider:   Glenetta Hew, MD

## 2020-04-12 NOTE — Progress Notes (Signed)
Primary Care Provider: Reynold Bowen, MD Cardiologist: Glenetta Hew, MD Electrophysiologist: None  Clinic Note: Chief Complaint  Patient presents with  . Hospitalization Follow-up    Recent GI bleed  . Shortness of Breath  . Edema    left leg  . Chest Pain    1 episode    HPI:    [name] is a 86 y.o. male with a PMH notable for anterior STEMI-PCI LAD with peri-MI A. fib, history of ischemic stroke in 2012 and now December 2020 who presents today for annual cardiology follow-up.   S/p  Anterior STEMI in March 2018.  He has a history of a major GI bleed requiring transfusion in 2014 &    Ischemic stroke in 2012 with carotid artery disease. -->  Recent CVA with right carotid disease-RCA December 2020; was started on Eliquis (however etiology was felt to be related to carotid disease)  1/6-03/2020: Admitted for R Carotid R CEA  He has hypertension and diet-controlled diabetes.  [name] was last seen on January 11, 2020 following his carotid endarterectomy.  He was doing fine from a cardiac standpoint with no significant symptoms.  No real residual symptoms from his stroke.  CEA scar was healing well.  Was walking about a mile to 1-1/2 miles with his walker.  Limited more by knee pain.  Was concerned about Plavix and Eliquis combination.   --> I felt that Plavix duration will be dependent upon vascular surgery following CEA.  Would prefer to have single treatment with Eliquis going forward.  Recent Hospitalizations:   [facility] ER visit 01/31/2020: Abdominal discomfort with distention, poor appetite normal nonbloody BM at the time.  CT scan suggested duodenitis and enteritis.  Treated with Prilosec and Flagyl.  02/03/2020 admitted with GI Bleed ([facility])-hypotensive and tachycardic on admission (blood pressures 80s/50s, HR 110s.  Large black tarry stool in ER.  Hemoglobin was 9.7 on arrival) -> Eliquis and Plavix stopped, volume resuscitated and transfused.  Beta-blocker held.   Noted to have 6 g drop in hemoglobin.  Started on IV Protonix.  EGD 02/04/2020 diffuse erosive esophagitis and actively bleeding ulcer in the duodenum - > treated with epinephrine injection and endoclips.  Multiple scattered duodenal bulb ulcers noted, but no other active bleeding. ->  48 hours IV Protonix and full liquid diet.  Recommendation was to hold Plavix and NSAIDs as well as anticoagulation for another 6 weeks.  Discharged with hemoglobin of 8.4.  Beta-blocker restarted because of tachycardia associated with chest discomfort-episodes lasted less than 1 minute with noted to be in atrial flutter with rates of 140s.  Converted with oral metoprolol back to control rate A. fib.  Troponins were elevated to roughly 2000 thought to be type II MI from anemia and tachycardia.  Cardiology consultation recommended restarting Plavix within 3 weeks but holding Eliquis.  2 units PRBC transfusion  Reviewed  CV studies:    The following studies were reviewed today: (if available, images/films reviewed: From Epic Chart or Care Everywhere) . 2D Echo: EF 55 to 60%.  Mild concentric LVH.  Mild LA dilation.  Mild to moderate MR.  Mild TR.  Aortic sclerosis but no stenosis   Interval History:   [name] presents here today for hospital follow-up stating he is doing okay from a cardiac standpoint he clearly had some episodes of chest discomfort and dyspnea when he was tachycardic and anemic.  His blood counts are slowly creeping up, but he is just not as  energetic as he had been. He is currently dealing with a UTI which has him set back a little bit as well.  He has had to slow down a little bit doing rehab on occasion with some exertional dyspnea.  But getting better.  He said he had one episode where he took nitroglycerin since his discharge but since then has not had any further chest pain or significant dyspnea at rest.  He is also not had any further melena or hematochezia. He is really weak probably  because he lost weight in the hospital.  Was essentially n.p.o. with clear liquid diet for couple days and he was frustrated and weak.  Gradually regaining his strength.  CV Review of Symptoms (Summary) Cardiovascular ROS: positive for - Mild lower extremity swelling since his hospital stay, more so than usual.  1 episode of chest pain since the episodes in the hospital.  Mild exertional dyspnea but more related to deconditioning and fatigue. negative for - irregular heartbeat, orthopnea, palpitations, paroxysmal nocturnal dyspnea, rapid heart rate, shortness of breath or While he may have some dizziness and borderline orthostatic symptoms, he has not had any syncope/near syncope or further TIA/amaurosis fugax.  No claudication.  The patient does not have symptoms concerning for COVID-19 infection (fever, chills, cough, or new shortness of breath).  The patient is practicing social distancing & Masking.   He completed his COVID-19 vaccine in the end of February.  REVIEWED OF SYSTEMS   Review of Systems  Constitutional: Positive for malaise/fatigue (Notably less energetic than he had been.  Taking a long time to recover from recent hospital stay). Negative for weight loss.  HENT: Positive for hearing loss. Negative for congestion and nosebleeds.   Cardiovascular: Positive for leg swelling (Mild ankle swelling right> left).  Gastrointestinal: Negative for abdominal pain, blood in stool and melena.       No further blood in his stools.  He does still have some dark stools from iron that he was taking, but not taking iron anymore.  Genitourinary: Negative for hematuria.  Musculoskeletal: Positive for joint pain (Mostly knee pain; he does have right-sided trace ankle swelling.). Negative for falls.       Walks with a rolling walker.  Neurological: Positive for weakness (Generalized). Negative for dizziness (Only if he stands very quickly), focal weakness and headaches.  Psychiatric/Behavioral:  Positive for memory loss. Negative for depression. The patient is not nervous/anxious and does not have insomnia.    I have reviewed and (if needed) personally updated the patient's problem list, medications, allergies, past medical and surgical history, social and family history.   PAST MEDICAL HISTORY   Past Medical History:  Diagnosis Date  . Anemia   . Arthritis   . CAD S/P percutaneous coronary angioplasty    a. 11/2016: anterior STEMI s/p DES to LAD.   . Diabetes mellitus without complication ([place])    Diet controlled  . GERD (gastroesophageal reflux disease)   . GI bleed    10/2013: 2/2 duodenal ulcer. ASA/plavix discontinued.   . Hyperlipemia   . Hypertension   . Hypothyroidism   . PAF (paroxysmal atrial fibrillation) ([place])    a. noted during admission in 10/2013 for acute GI bleed. No recurrunce since. Monitor in 2014 with only PAC/PVCs  . Peripheral vascular disease ([place])   . ST elevation myocardial infarction (STEMI) involving left anterior descending (LAD) coronary artery with complication ([place]) 62/56/3893   - 99% subtotal occlusion of LAD treated with DES stent.  Marland Kitchen  Stroke [facility])    a. ichemic CVA in 2012.     PAST SURGICAL HISTORY   Past Surgical History:  Procedure Laterality Date  . CORONARY STENT INTERVENTION N/A 12/11/2016   Procedure: Coronary Stent Intervention-Mid LAD;  Surgeon: Leonie Man, MD;  Location: [place] [facility];  Service: Cardiovascular:  mLAD 99%-0% Synergy DES 2.75 x 16 (3.0 mm)  . ENDARTERECTOMY Right 10/07/2019   Procedure: ENDARTERECTOMY CAROTID RIGHT;  Surgeon: Marty Heck, MD;  Location: [place];  Service: Vascular;  Laterality: Right;  . ESOPHAGOGASTRODUODENOSCOPY N/A 09/23/2013   Procedure: ESOPHAGOGASTRODUODENOSCOPY (EGD);  Surgeon: Lafayette Dragon, MD;  Location[facility] ENDOSCOPY;  Service: Endoscopy;  Laterality: N/A;  . ESOPHAGOGASTRODUODENOSCOPY N/A 09/25/2013   Procedure: ESOPHAGOGASTRODUODENOSCOPY (EGD);  Surgeon: Beryle Beams,  MD;  Location[facility] ENDOSCOPY;  Service: Endoscopy;  Laterality: N/A;  . ESOPHAGOGASTRODUODENOSCOPY N/A 12/18/2013   Procedure: ESOPHAGOGASTRODUODENOSCOPY (EGD);  Surgeon: Beryle Beams, MD;  Location: [facility];  Service: Endoscopy;  Laterality: N/A;  . LEFT HEART CATH AND CORONARY ANGIOGRAPHY N/A 12/11/2016   Procedure: Left Heart Cath and Coronary Angiography;  Surgeon: Leonie Man, MD;  Location: [place] [facility];  Service: Cardiovascular: LM 45%. mLAD 99% (PCI), dLAD 40% & 50%, ostD1 - 50%. Ost-prox Cx 60%, OstOM1 60% (Med Rx).  Anteroapical HK - EF 50-55%. Normal LVEDP  . TOTAL HIP ARTHROPLASTY Right 04/28/2013   Procedure: RIGHT TOTAL HIP ARTHROPLASTY ANTERIOR APPROACH;  Surgeon: Mauri Pole, MD;  Location: [facility];  Service: Orthopedics;  Laterality: Right;  . TRANSTHORACIC ECHOCARDIOGRAM  12/12/2016   a) s/p AntSTEMI:  Nl LV size & fnx. EF 60-65%, no RWMA. GR 2 DD. Mild LA dilation;;; b) 09/04/2019 TTE for CVA: EF 55-60%, No RWMA.  GR 1-2 DD. Normal Atrial Size. Normal Valve Dz.  . TRANSTHORACIC ECHOCARDIOGRAM  01/2020   [facility]: EF 55-60%.  Mild concentric LVH.  Mild LA dilation.  Mild-moderate MR.  Mild TR.  Aortic sclerosis with no stenosis.    Cardiac Cath-PCI 12/11/2016: EF 50-50%: Culprit 99% mLAD  - Synergy DES 2.75 x 16 (3.0 mm); 45% mid-distal LM, ostial-proximal LCx 60% associated with ostial OM1 60%. ,     MEDICATIONS/ALLERGIES   Current Meds  Medication Sig  . clopidogrel (PLAVIX) 75 MG tablet TAKE 1 TABLET EVERY DAY WITH BREAKFAST.  Marland Kitchen Empagliflozin-linaGLIPtin (GLYXAMBI) 10-5 MG TABS Take 1 tablet by mouth daily.   . ergocalciferol (VITAMIN D2) 50000 UNITS capsule Take 50,000 Units by mouth once a week. Patient takes on Fridays  . LANTUS SOLOSTAR 100 UNIT/ML Solostar Pen Inject 15 Units into the skin daily.   Marland Kitchen levothyroxine (SYNTHROID, LEVOTHROID) 50 MCG tablet Take 50 mcg by mouth daily before breakfast.  . metoprolol tartrate (LOPRESSOR) 25 MG tablet Take 1  tablet (25 mg total) by mouth 2 (two) times daily.  . nitroGLYCERIN (NITROSTAT) 0.4 MG SL tablet Place 1 tablet (0.4 mg total) under the tongue every 5 (five) minutes x 3 doses as needed for chest pain.  Marland Kitchen omeprazole (PRILOSEC) 20 MG capsule Take 1 capsule (20 mg total) by mouth 2 (two) times daily before a meal. (Patient taking differently: Take 20 mg by mouth as needed. )  . rosuvastatin (CRESTOR) 20 MG tablet Take 20 mg by mouth every morning.   . [DISCONTINUED] HYDROcodone-acetaminophen (NORCO/VICODIN) 5-325 MG tablet Take 1 tablet by mouth every 8 (eight) hours as needed for moderate pain.  . [DISCONTINUED] metroNIDAZOLE (FLAGYL) 500 MG tablet Take 1 tablet (500 mg total) by  mouth 2 (two) times daily. One po bid x 7 days  . [DISCONTINUED] pantoprazole (PROTONIX) 40 MG tablet TAKE 1 TABLET ONCE DAILY.    Allergies  Allergen Reactions  . Adhesive [Tape] Rash    Zoll pads adhesive    SOCIAL HISTORY/FAMILY HISTORY   Reviewed in Epic:  Pertinent findings: Still lives at the independent living facility-[facility], but his daughter carefully watches out for his care.   OBJCTIVE -PE, EKG, labs   Wt Readings from Last 3 Encounters:  04/12/20 159 lb 6.4 oz (72.3 kg)  01/31/20 163 lb (73.9 kg)  01/11/20 168 lb 12.8 oz (76.6 kg)    Physical Exam: BP (!) 142/66 (BP Location: Left Arm, Patient Position: Sitting, Cuff Size: Normal)   Pulse 65   Ht 5\' 9"  (1.753 m)   Wt 159 lb 6.4 oz (72.3 kg)   SpO2 99%   BMI 23.54 kg/m  Physical Exam Vitals reviewed.  Constitutional:      General: He is not in acute distress.    Appearance: He is well-developed.     Comments: Definitely starting to look more like his stated age.  Seems somewhat frail, but nontoxic.  Weight has dropped. Less steady gait, still using Rollator walker  HENT:     Head: Normocephalic and atraumatic.  Neck:     Vascular: No carotid bruit, hepatojugular reflux or JVD.     Comments: Right CEA scar healed  well. Cardiovascular:     Rate and Rhythm: Normal rate and regular rhythm. Occasional extrasystoles are present.    Chest Wall: PMI is not displaced.     Pulses: Intact distal pulses.     Heart sounds: S1 normal and S2 normal. Heart sounds not distant. Murmur (1/6 SEM at RUSB) heard.  No gallop.   Pulmonary:     Effort: Pulmonary effort is normal. No respiratory distress.     Breath sounds: Normal breath sounds. No wheezing or rales.  Abdominal:     General: Bowel sounds are normal. There is no distension.     Palpations: Abdomen is soft.     Tenderness: There is no abdominal tenderness.  Musculoskeletal:        General: Normal range of motion.     Cervical back: Normal range of motion and neck supple.     Right lower leg: Edema (1+) present.     Left lower leg: Edema (2+) present.  Neurological:     General: No focal deficit present.     Mental Status: He is alert and oriented to person, place, and time.  Psychiatric:        Mood and Affect: Mood normal.        Behavior: Behavior normal. Behavior is not agitated.        Thought Content: Thought content normal.        Judgment: Judgment normal.      Adult ECG Report N/a   Recent Labs:    Discharge CBC 02/06/2019: H/H 8.6/25.0 with a WBC of 11.3.  PLT 218; BUN/Cr 11/0.9.  K+ 4.1.  Calcium 7.6.  Lab Results  Component Value Date   CHOL 97 09/25/2019   HDL 30 (L) 09/25/2019   LDLCALC 38 09/25/2019   TRIG 145 09/25/2019   CHOLHDL 3.2 09/25/2019   Lab Results  Component Value Date   CREATININE 0.99 01/31/2020   BUN 25 (H) 01/31/2020   NA 136 01/31/2020   K 4.0 01/31/2020   CL 103 01/31/2020   CO2 21 (L) 01/31/2020  Lab Results  Component Value Date   TSH 1.451 09/24/2019    ASSESSMENT/PLAN    Problem List Items Addressed This Visit    CAD S/P percutaneous coronary angioplasty (Chronic)    He has not really had that much angina since PCI to the LAD.  He does have existing left main and LCx-OM disease which we  have been treating medically.  Especially because of his need for being on Eliquis I would like to avoid further PCI.  He is on low-dose beta-blocker and statin.  Plan: Continue beta-blocker and statin.  Defer decision on maintenance dose Plavix to Dr. Carlis Abbott following CEA. ->  If we do stop Plavix, would switch back to Eliquis (at age/renal function based dosage)  Continue Glyxambi for diabetes with cardiovascular benefit.  No diuretic requirement.      Essential hypertension (Chronic)    Not related major issue.  He is on low-dose beta-blocker and I would not want to push any further.  With his baseline poor balance now post stroke, I am more concerned about orthostatic hypotension and dizziness leading to a fall then any concerned about hypertension.      History of GI bleed - Primary (Chronic)    Complicated issue now that he has had yet again another GI bleed in May.  I was leery of him being on Eliquis and Plavix and again would not do double therapy.  He is currently on Plavix-as much for his recent CEA and for his CAD.  Based on his CHA2DS2-VASc score, would preferably have him on Eliquis alone since he has had recurrent strokes.  Eliquis in general has a lower GI bleed risk then aspirin or Plavix.      Hyperlipidemia associated with type 2 diabetes mellitus ([place]) (Chronic)    Last lipids as of December showed excellent LDL levels on current dose of rosuvastatin.  No changes for now.  If next levels are this well controlled, can probably reduce the rosuvastatin dose.  He is on Glyxambi for diabetes which would be empagliflozin.  With the empagliflozin component, there is adequate cardiovascular benefit.      PAF (paroxysmal atrial fibrillation) ([facility]) (Chronic)    He clearly has had episodes of paroxysmal A. fib, including his recent hospitalization.  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    He is on  low-dose beta-blocker with adequate rate control.  Would not want to push further because of fear of bradycardia and hypotension.  Once Dr. Carlis Abbott says it is okay for him to no longer be on Plavix following his CEA, I would like to convert back to Eliquis as it does have a lower GI bleed risk.  Would also stop aspirin and NSAIDs.         COVID-19 Education: The signs and symptoms of COVID-19 were discussed with the patient and how to seek care for testing (follow up with PCP or arrange E-visit).   The importance of social distancing was discussed today.  I spent a total of 26 minutes with the patient. >  50% of the time was spent in direct patient consultation.  Additional time spent with chart review  / charting (studies, outside notes, etc): 16 Total Time: 70min   Current medicines are reviewed at length with the patient today.  (+/- concerns) none  Notice: This dictation was prepared with Dragon dictation along with smaller phrase technology. Any transcriptional errors that result from  this process are unintentional and may not be corrected upon review.  Patient Instructions / Medication Changes & Studies & Tests Ordered   Patient Instructions  Medication Instructions:  No changes   If you notice blood in your urine or stool you may stop Clopidogrel until it stops   *If you need a refill on your cardiac medications before your next appointment, please call your pharmacy*   Lab Work: Not needed If you have labs (blood work) drawn today and your tests are completely normal, you will receive your results only by: Marland Kitchen MyChart Message (if you have MyChart) OR . A paper copy in the mail If you have any lab test that is abnormal or we need to change your treatment, we will call you to review the results.   Testing/Procedures: Not needed   Follow-Up: At [facility], you and your health needs are our priority.  As part of our continuing mission to provide you with exceptional  heart care, we have created designated Provider Care Teams.  These Care Teams include your primary Cardiologist (physician) and Advanced Practice Providers (APPs -  Physician Assistants and Nurse Practitioners) who all work together to provide you with the care you need, when you need it.      Your next appointment:   6 month(s)  The format for your next appointment:   In Person  Provider:   Glenetta Hew, MD       Studies Ordered:   No orders of the defined types were placed in this encounter.    Glenetta Hew, M.D., M.S. Interventional Cardiologist   Pager # [phone] Phone # [phone] [address] [place], [place] [address]   Thank you for choosing Heartcare at [facility]!!

## 2020-04-14 DIAGNOSIS — R3989 Other symptoms and signs involving the genitourinary system: Secondary | ICD-10-CM | POA: Diagnosis not present

## 2020-04-14 DIAGNOSIS — N390 Urinary tract infection, site not specified: Secondary | ICD-10-CM | POA: Diagnosis not present

## 2020-04-16 ENCOUNTER — Encounter: Payer: Self-pay | Admitting: Cardiology

## 2020-04-16 NOTE — Assessment & Plan Note (Signed)
Last lipids as of December showed excellent LDL levels on current dose of rosuvastatin.  No changes for now.  If next levels are this well controlled, can probably reduce the rosuvastatin dose.  He is on Glyxambi for diabetes which would be empagliflozin.  With the empagliflozin component, there is adequate cardiovascular benefit.

## 2020-04-16 NOTE — Assessment & Plan Note (Signed)
He clearly has had episodes of paroxysmal A. fib, including his recent hospitalization.  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    He is on low-dose beta-blocker with adequate rate control.  Would not want to push further because of fear of bradycardia and hypotension.  Once Dr. Carlis Abbott says it is okay for him to no longer be on Plavix following his CEA, I would like to convert back to Eliquis as it does have a lower GI bleed risk.  Would also stop aspirin and NSAIDs.

## 2020-04-16 NOTE — Assessment & Plan Note (Signed)
Not related major issue.  He is on low-dose beta-blocker and I would not want to push any further.  With his baseline poor balance now post stroke, I am more concerned about orthostatic hypotension and dizziness leading to a fall then any concerned about hypertension.

## 2020-04-16 NOTE — Assessment & Plan Note (Signed)
He has not really had that much angina since PCI to the LAD.  He does have existing left main and LCx-OM disease which we have been treating medically.  Especially because of his need for being on Eliquis I would like to avoid further PCI.  He is on low-dose beta-blocker and statin.  Plan: Continue beta-blocker and statin.  Defer decision on maintenance dose Plavix to Dr. Carlis Abbott following CEA. ->  If we do stop Plavix, would switch back to Eliquis (at age/renal function based dosage)  Continue Glyxambi for diabetes with cardiovascular benefit.  No diuretic requirement.

## 2020-04-16 NOTE — Assessment & Plan Note (Signed)
Complicated issue now that he has had yet again another GI bleed in May.  I was leery of him being on Eliquis and Plavix and again would not do double therapy.  He is currently on Plavix-as much for his recent CEA and for his CAD.  Based on his CHA2DS2-VASc score, would preferably have him on Eliquis alone since he has had recurrent strokes.  Eliquis in general has a lower GI bleed risk then aspirin or Plavix.

## 2020-04-20 DIAGNOSIS — J189 Pneumonia, unspecified organism: Secondary | ICD-10-CM | POA: Diagnosis not present

## 2020-04-20 DIAGNOSIS — R2689 Other abnormalities of gait and mobility: Secondary | ICD-10-CM | POA: Diagnosis not present

## 2020-04-20 DIAGNOSIS — M6281 Muscle weakness (generalized): Secondary | ICD-10-CM | POA: Diagnosis not present

## 2020-04-26 DIAGNOSIS — M6281 Muscle weakness (generalized): Secondary | ICD-10-CM | POA: Diagnosis not present

## 2020-04-26 DIAGNOSIS — J189 Pneumonia, unspecified organism: Secondary | ICD-10-CM | POA: Diagnosis not present

## 2020-04-26 DIAGNOSIS — R2689 Other abnormalities of gait and mobility: Secondary | ICD-10-CM | POA: Diagnosis not present

## 2020-04-28 DIAGNOSIS — J189 Pneumonia, unspecified organism: Secondary | ICD-10-CM | POA: Diagnosis not present

## 2020-04-28 DIAGNOSIS — M6281 Muscle weakness (generalized): Secondary | ICD-10-CM | POA: Diagnosis not present

## 2020-04-28 DIAGNOSIS — R2689 Other abnormalities of gait and mobility: Secondary | ICD-10-CM | POA: Diagnosis not present

## 2020-05-03 DIAGNOSIS — C44319 Basal cell carcinoma of skin of other parts of face: Secondary | ICD-10-CM | POA: Diagnosis not present

## 2020-05-03 DIAGNOSIS — Z85828 Personal history of other malignant neoplasm of skin: Secondary | ICD-10-CM | POA: Diagnosis not present

## 2020-05-03 DIAGNOSIS — L821 Other seborrheic keratosis: Secondary | ICD-10-CM | POA: Diagnosis not present

## 2020-05-03 DIAGNOSIS — C4441 Basal cell carcinoma of skin of scalp and neck: Secondary | ICD-10-CM | POA: Diagnosis not present

## 2020-05-03 DIAGNOSIS — D485 Neoplasm of uncertain behavior of skin: Secondary | ICD-10-CM | POA: Diagnosis not present

## 2020-05-03 DIAGNOSIS — L570 Actinic keratosis: Secondary | ICD-10-CM | POA: Diagnosis not present

## 2020-05-09 ENCOUNTER — Encounter: Payer: Self-pay | Admitting: *Deleted

## 2020-05-13 ENCOUNTER — Emergency Department (HOSPITAL_COMMUNITY)
Admission: EM | Admit: 2020-05-13 | Discharge: 2020-05-13 | Disposition: A | Discharge status: Home / Self Care | Payer: Medicare Other | Attending: Emergency Medicine | Admitting: Emergency Medicine

## 2020-05-13 ENCOUNTER — Emergency Department (HOSPITAL_COMMUNITY): Payer: Medicare Other

## 2020-05-13 ENCOUNTER — Other Ambulatory Visit: Payer: Self-pay

## 2020-05-13 DIAGNOSIS — S02401A Maxillary fracture, unspecified, initial encounter for closed fracture: Secondary | ICD-10-CM

## 2020-05-13 DIAGNOSIS — Y9301 Activity, walking, marching and hiking: Secondary | ICD-10-CM | POA: Insufficient documentation

## 2020-05-13 DIAGNOSIS — S0101XA Laceration without foreign body of scalp, initial encounter: Secondary | ICD-10-CM | POA: Diagnosis not present

## 2020-05-13 DIAGNOSIS — S199XXA Unspecified injury of neck, initial encounter: Secondary | ICD-10-CM | POA: Diagnosis not present

## 2020-05-13 DIAGNOSIS — Y999 Unspecified external cause status: Secondary | ICD-10-CM | POA: Diagnosis not present

## 2020-05-13 DIAGNOSIS — Z7901 Long term (current) use of anticoagulants: Secondary | ICD-10-CM | POA: Diagnosis not present

## 2020-05-13 DIAGNOSIS — Y9289 Other specified places as the place of occurrence of the external cause: Secondary | ICD-10-CM | POA: Insufficient documentation

## 2020-05-13 DIAGNOSIS — R0902 Hypoxemia: Secondary | ICD-10-CM | POA: Diagnosis not present

## 2020-05-13 DIAGNOSIS — S0990XA Unspecified injury of head, initial encounter: Secondary | ICD-10-CM | POA: Diagnosis not present

## 2020-05-13 DIAGNOSIS — R58 Hemorrhage, not elsewhere classified: Secondary | ICD-10-CM | POA: Diagnosis not present

## 2020-05-13 DIAGNOSIS — S0240CA Maxillary fracture, right side, initial encounter for closed fracture: Secondary | ICD-10-CM | POA: Diagnosis not present

## 2020-05-13 DIAGNOSIS — W010XXA Fall on same level from slipping, tripping and stumbling without subsequent striking against object, initial encounter: Secondary | ICD-10-CM | POA: Insufficient documentation

## 2020-05-13 DIAGNOSIS — S0231XA Fracture of orbital floor, right side, initial encounter for closed fracture: Secondary | ICD-10-CM | POA: Diagnosis not present

## 2020-05-13 DIAGNOSIS — I10 Essential (primary) hypertension: Secondary | ICD-10-CM | POA: Diagnosis not present

## 2020-05-13 DIAGNOSIS — W19XXXA Unspecified fall, initial encounter: Secondary | ICD-10-CM | POA: Diagnosis not present

## 2020-05-13 MED ORDER — LIDOCAINE HCL (PF) 1 % IJ SOLN
30.0000 mL | Freq: Once | INTRAMUSCULAR | Status: DC
  Filled 2020-05-13: qty 30

## 2020-05-13 NOTE — ED Provider Notes (Addendum)
[facility] Emergency Department Provider Note MRN:  [id number]  Arrival date & time: 05/13/20     Chief Complaint   Fall History of Present Illness   [name] is a 86 y.o. year-old male with a history of A. fib, CAD presenting to the ED with chief complaint of fall.  Was walking with the trash bag and tripped over the trash bag falling to the ground.  Hit the back of his head, no loss of consciousness, no neck pain, no back pain, no chest pain, no shortness of breath, no abdominal pain, no injuries to the arms or legs.  Takes Plavix.  Level 2 trauma.  Review of Systems  A complete 10 system review of systems was obtained and all systems are negative except as noted in the HPI and PMH.   Patient's Health History   Past medical history: A. fib, CAD   Family history: Mother with dementia Social History   Socioeconomic History  . Marital status: Widowed    Spouse name: Not on file  . Number of children: Not on file  . Years of education: Not on file  . Highest education level: Not on file  Occupational History  . Not on file  Tobacco Use  . Smoking status: Not on file  Substance and Sexual Activity  . Alcohol use: Not on file  . Drug use: Not on file  . Sexual activity: Not on file  Other Topics Concern  . Not on file  Social History Narrative  . Not on file   Social Determinants of Health   Financial Resource Strain:   . Difficulty of Paying Living Expenses:   Food Insecurity:   . Worried About Charity fundraiser in the Last Year:   . Arboriculturist in the Last Year:   Transportation Needs:   . Film/video editor (Medical):   Marland Kitchen Lack of Transportation (Non-Medical):   Physical Activity:   . Days of Exercise per Week:   . Minutes of Exercise per Session:   Stress:   . Feeling of Stress :   Social Connections:   . Frequency of Communication with Friends and Family:   . Frequency of Social Gatherings with Friends and Family:   .  Attends Religious Services:   . Active Member of Clubs or Organizations:   . Attends Archivist Meetings:   Marland Kitchen Marital Status:   Intimate Partner Violence:   . Fear of Current or Ex-Partner:   . Emotionally Abused:   Marland Kitchen Physically Abused:   . Sexually Abused:      Physical Exam   Vitals:   05/13/20 1245 05/13/20 1430  BP: (!) 165/84 (!) 178/90  Pulse: 73 74  Resp: 13 17  Temp:    SpO2: 99% 100%    CONSTITUTIONAL: Well-appearing, NAD NEURO:  Alert and oriented x 3, no focal deficits EYES:  eyes equal and reactive ENT/NECK:  no LAD, no JVD CARDIO: Regular rate, well-perfused, normal S1 and S2 PULM:  CTAB no wheezing or rhonchi GI/GU:  normal bowel sounds, non-distended, non-tender MSK/SPINE:  No gross deformities, no edema SKIN: Abrasion to the right occipital scalp with small oozing of blood PSYCH:  Appropriate speech and behavior  *Additional and/or pertinent findings included in MDM below  Diagnostic and Interventional Summary    EKG Interpretation  Date/Time:    Ventricular Rate:    PR Interval:    QRS Duration:   QT Interval:  QTC Calculation:   R Axis:     Text Interpretation:        Labs Reviewed - No data to display  CT Head Wo Contrast    (Results Pending)  CT CERVICAL SPINE WO CONTRAST    (Results Pending)    Medications  lidocaine (PF) (XYLOCAINE) 1 % injection 30 mL (has no administration in time range)     Procedures  /  Critical Care .Marland KitchenLaceration Repair  Date/Time: 05/13/2020 1:35 PM Performed by: Maudie Flakes, MD Authorized by: Maudie Flakes, MD   Consent:    Consent obtained:  Verbal   Consent given by:  Patient   Risks discussed:  Infection, need for additional repair, pain, poor cosmetic result, poor wound healing, retained foreign body, tendon damage, vascular damage and nerve damage Anesthesia (see MAR for exact dosages):    Anesthesia method:  Local infiltration   Local anesthetic:  Lidocaine 1% w/o epi Laceration  details:    Location:  Scalp   Scalp location:  Occipital   Length (cm):  2.5   Depth (mm):  2 Repair type:    Repair type:  Intermediate Pre-procedure details:    Preparation:  Patient was prepped and draped in usual sterile fashion Exploration:    Hemostasis achieved with:  Tied off vessels   Wound exploration: wound explored through full range of motion and entire depth of wound probed and visualized     Contaminated: no   Treatment:    Area cleansed with:  Saline   Amount of cleaning:  Extensive Subcutaneous repair:    Suture size:  4-0   Suture material:  Vicryl   Number of sutures:  2 Skin repair:    Repair method:  Sutures   Suture size:  4-0   Wound skin closure material used: Vicryl.   Suture technique:  Simple interrupted   Number of sutures:  3 Approximation:    Approximation:  Close Post-procedure details:    Dressing:  Sterile dressing   Patient tolerance of procedure:  Tolerated well, no immediate complications Comments:     Small scalp laceration with small bleeding artery, requiring 2 figure-of-eight stitches placed subcutaneously for hemostasis. Skin layer then closed with absorbable sutures as described above    ED Course and Medical Decision Making  I have reviewed the triage vital signs, the nursing notes, and pertinent available records from the EMR.  Listed above are laboratory and imaging tests that I personally ordered, reviewed, and interpreted and then considered in my medical decision making (see below for details).  Mechanical fall, head trauma, anticoagulated, awaiting CT imaging.  Reassuring neurological exam and vital signs.     On my reassessment patient is soaking through his scalp laceration dressing and the pillow that he is laying his head. Upon closer inspection he has a small bleeding artery within this scalp laceration, repaired as described above. He is now hemostatic and awaiting imaging.  Signed out to oncoming provider,  anticipating discharge.  Family updated and can pick patient up.  Barth Kirks. Sedonia Small, MD [place] [email or web address]  Final Clinical Impressions(s) / ED Diagnoses     ICD-10-CM   1. Laceration of scalp, initial encounter  S01.01XA   2. Fall, initial encounter  W19.XXXA   3. Traumatic injury of head, initial encounter  S09.90XA   4. Anticoagulated  Z79.01     ED Discharge Orders    None       Discharge Instructions  Discussed with and Provided to Patient:     Discharge Instructions     You were evaluated in the Emergency Department and after careful evaluation, we did not find any emergent condition requiring admission or further testing in the hospital.  Your exam/testing today is overall reassuring.  CT scans without any significant injuries.  Your scalp laceration was repaired with absorbable sutures and so they do not need to be removed.  Please return to the Emergency Department if you experience any worsening of your condition.   Thank you for allowing [place] to be a part of your care.       Maudie Flakes, MD 05/13/20 1448    Maudie Flakes, MD 05/13/20 [phone]

## 2020-05-13 NOTE — ED Notes (Signed)
D/c instructions reviewed w/ pt.  Pt verbalized understanding. Pt. D/C.

## 2020-05-13 NOTE — Progress Notes (Signed)
Orthopedic Tech Progress Note Patient Details:  JAVEION CANNEDY 10-Nov-1923 [id number] Level 2 Trauma Patient ID: Lucretia Kern, male   DOB: [birth year], 86 y.o.   MRN: [id number]   Tammy Sours 05/13/2020, 12:19 PM

## 2020-05-13 NOTE — ED Notes (Signed)
Called [facility] for transportation  

## 2020-05-13 NOTE — ED Triage Notes (Signed)
Pt BIB [facility] from [facility]. Pt fell today and hit his head. Pt presents with a hematoma and laceration to top/back of the head. Pt A&Ox4. VSS. Pt denies loss of consciousness, denies n/v, denies neck and back pain.

## 2020-05-13 NOTE — ED Provider Notes (Signed)
Patient signed out to d/c to home when cts resulted.   CT neg for hem. Facial fx noted.   Pt alert, content, no distress.   Pt appears stable for d/c.      Lajean Saver, MD 05/13/20 [phone]

## 2020-05-13 NOTE — Discharge Instructions (Addendum)
You were evaluated in the Emergency Department and after careful evaluation, we did not find any emergent condition requiring admission or further testing in the hospital.  Your exam/testing today is overall reassuring.  CT scans  showed a non-displaced fracture of the right inferior/lateral maxillary sinus, no other significant injuries were noted.  Your scalp laceration was repaired with absorbable sutures and so they do not need to be removed.  Please return to the Emergency Department if you experience any worsening of your condition.   Thank you for allowing [place] to be a part of your care.

## 2020-05-23 DIAGNOSIS — N390 Urinary tract infection, site not specified: Secondary | ICD-10-CM | POA: Diagnosis not present

## 2020-05-25 DIAGNOSIS — R2689 Other abnormalities of gait and mobility: Secondary | ICD-10-CM | POA: Diagnosis not present

## 2020-05-25 DIAGNOSIS — J189 Pneumonia, unspecified organism: Secondary | ICD-10-CM | POA: Diagnosis not present

## 2020-05-25 DIAGNOSIS — M6281 Muscle weakness (generalized): Secondary | ICD-10-CM | POA: Diagnosis not present

## 2020-05-30 DIAGNOSIS — E1165 Type 2 diabetes mellitus with hyperglycemia: Secondary | ICD-10-CM | POA: Diagnosis not present

## 2020-06-01 ENCOUNTER — Ambulatory Visit (INDEPENDENT_AMBULATORY_CARE_PROVIDER_SITE_OTHER): Payer: Medicare Other | Admitting: Internal Medicine

## 2020-06-01 ENCOUNTER — Encounter: Payer: Self-pay | Admitting: Internal Medicine

## 2020-06-01 ENCOUNTER — Other Ambulatory Visit (INDEPENDENT_AMBULATORY_CARE_PROVIDER_SITE_OTHER): Payer: Medicare Other

## 2020-06-01 VITALS — BP 140/80 | HR 74 | Ht 69.0 in | Wt 163.0 lb

## 2020-06-01 DIAGNOSIS — Z8719 Personal history of other diseases of the digestive system: Secondary | ICD-10-CM

## 2020-06-01 DIAGNOSIS — I2510 Atherosclerotic heart disease of native coronary artery without angina pectoris: Secondary | ICD-10-CM | POA: Diagnosis not present

## 2020-06-01 DIAGNOSIS — Z9861 Coronary angioplasty status: Secondary | ICD-10-CM

## 2020-06-01 DIAGNOSIS — K269 Duodenal ulcer, unspecified as acute or chronic, without hemorrhage or perforation: Secondary | ICD-10-CM | POA: Diagnosis not present

## 2020-06-01 DIAGNOSIS — D509 Iron deficiency anemia, unspecified: Secondary | ICD-10-CM

## 2020-06-01 LAB — CBC WITH DIFFERENTIAL/PLATELET
Basophils Absolute: 0.1 10*3/uL (ref 0.0–0.1)
Basophils Relative: 1.2 % (ref 0.0–3.0)
Eosinophils Absolute: 0.1 10*3/uL (ref 0.0–0.7)
Eosinophils Relative: 0.9 % (ref 0.0–5.0)
HCT: 33.7 % — ABNORMAL LOW (ref 39.0–52.0)
Hemoglobin: 10.8 g/dL — ABNORMAL LOW (ref 13.0–17.0)
Lymphocytes Relative: 20.8 % (ref 12.0–46.0)
Lymphs Abs: 2.4 10*3/uL (ref 0.7–4.0)
MCHC: 32 g/dL (ref 30.0–36.0)
MCV: 82.2 fl (ref 78.0–100.0)
Monocytes Absolute: 1.5 10*3/uL — ABNORMAL HIGH (ref 0.1–1.0)
Monocytes Relative: 12.7 % — ABNORMAL HIGH (ref 3.0–12.0)
Neutro Abs: 7.5 10*3/uL (ref 1.4–7.7)
Neutrophils Relative %: 64.4 % (ref 43.0–77.0)
Platelets: 370 10*3/uL (ref 150.0–400.0)
RBC: 4.1 Mil/uL — ABNORMAL LOW (ref 4.22–5.81)
RDW: 18.9 % — ABNORMAL HIGH (ref 11.5–15.5)
WBC: 11.6 10*3/uL — ABNORMAL HIGH (ref 4.0–10.5)

## 2020-06-01 LAB — IBC + FERRITIN
Ferritin: 17.2 ng/mL — ABNORMAL LOW (ref 22.0–322.0)
Iron: 42 ug/dL (ref 42–165)
Saturation Ratios: 8.6 % — ABNORMAL LOW (ref 20.0–50.0)
Transferrin: 347 mg/dL (ref 212.0–360.0)

## 2020-06-01 NOTE — Patient Instructions (Signed)
Your provider has requested that you go to the basement level for lab work before leaving today. Press "B" on the elevator. The lab is located at the first door on the left as you exit the elevator.  Continue your reflux medication.  If you are age 84 or older, your body mass index should be between 23-30. Your Body mass index is 24.07 kg/m. If this is out of the aforementioned range listed, please consider follow up with your Primary Care Provider.  If you are age 69 or younger, your body mass index should be between 19-25. Your Body mass index is 24.07 kg/m. If this is out of the aformentioned range listed, please consider follow up with your Primary Care Provider.   Due to recent changes in healthcare laws, you may see the results of your imaging and laboratory studies on MyChart before your provider has had a chance to review them.  We understand that in some cases there may be results that are confusing or concerning to you. Not all laboratory results come back in the same time frame and the provider may be waiting for multiple results in order to interpret others.  Please give [place] 48 hours in order for your provider to thoroughly review all the results before contacting the office for clarification of your results.

## 2020-06-01 NOTE — Progress Notes (Signed)
Patient ID: [name], male   DOB: 1923-10-22, 86 y.o.   MRN: [id number] HPI: [name] is a 86 year old male with a past medical history of bleeding duodenal ulcer, erosive esophagitis, CAD currently on Plavix and previously on Eliquis, prior stroke, history of atrial fibrillation, hypertension, hyperlipidemia, hypothyroidism, CKD who presents to follow-up hospitalization for bleeding duodenal ulcer.  He is here today with his daughter, [name].  This is his first visit with me but he has also previously seen Dr. Carol Ada who cared for him in 2015 when he had his first upper GI bleed due to duodenal ulcer.  He was diagnosed with a bleeding duodenal ulcer around the third portion of the duodenum around Christmas in 2014.  This was treated with Endo clipping by Dr. Benson Norway.  He came back for upper endoscopy on 12/18/2013 and the upper endoscopy at that point was normal.  In early May 2021 he developed 5 days of upper abdominal/epigastric discomfort as well as weakness and poor appetite.  He was taken to the hospital in [facility] where he was found to have an acute upper GI bleed.  He later developed melena and nausea with vomiting.  EGD was performed on 02/04/2020 by Dr. Shana Chute. EGD: Diffuse erosive changes in the distal third of the esophagus without active bleeding.  No varices.  Normal stomach.  Bright red blood in the duodenum and an actively bleeding ulcer in the second portion.  This was injected with epinephrine and 2 endoclips were placed for hemostasis.  There were multiple whitish base shallow ulcers throughout the duodenal bulb and second portion without active hemorrhage. He was checked for H. pylori and negative.  He was observed after hemostasis was achieved endoscopically and discharged.  Eventually Plavix was restarted but Eliquis was not.  Subsequently he has been feeling well.  He has been on PPI but there is question about which PPI he has been taking.  I see both pantoprazole 40 mg  and omeprazole 20 mg twice daily in the chart.  His daughter will confirm and let me know.  Either way he has had no further upper abdominal pain.  His appetite has been normal for him.  He has not had nausea or vomiting.  No further black stools.  His bowel movements have been regular.  Occasionally he will have loose stools and use Imodium.  He does not nor was he previously this year using NSAIDs and any form or fashion.  He does rarely use Tylenol.  He had blood counts done by primary care in June and hemoglobin was stable at 9.3 and 9.4 in early in late June.  Past Medical History:  Diagnosis Date  . Anemia   . Aortic atherosclerosis ([place])   . Arthritis   . CAD S/P percutaneous coronary angioplasty    a. 11/2016: anterior STEMI s/p DES to LAD.   Marland Kitchen CKD (chronic kidney disease), stage III   . Diabetes mellitus without complication ([place])    Diet controlled  . Diverticulosis   . Duodenal ulcer   . Erosive esophagitis   . GERD (gastroesophageal reflux disease)   . GI bleed    10/2013: 2/2 duodenal ulcer. ASA/plavix discontinued.   . Hemorrhagic shock ([place])   . Hyperlipemia   . Hypertension   . Hypothyroidism   . NSTEMI (non-ST elevated myocardial infarction) ([place]) 2021  . Osteoarthritis   . PAF (paroxysmal atrial fibrillation) ([place])    a. noted during admission in 10/2013 for acute GI bleed.  No recurrunce since. Monitor in 2014 with only PAC/PVCs  . Peripheral vascular disease ([place])   . ST elevation myocardial infarction (STEMI) involving left anterior descending (LAD) coronary artery with complication ([place]) 16/07/9603   - 99% subtotal occlusion of LAD treated with DES stent.  . Stroke [facility])    a. ichemic CVA in 2012.   . Vitamin D deficiency     Past Surgical History:  Procedure Laterality Date  . BASAL CELL CARCINOMA EXCISION  2000   face  . BASAL CELL CARCINOMA EXCISION  2011   scalp  . CARPAL TUNNEL RELEASE Right 2002  . CORONARY STENT INTERVENTION N/A 12/11/2016    Procedure: Coronary Stent Intervention-Mid LAD;  Surgeon: Leonie Man, MD;  Location: [place] CV LAB;  Service: Cardiovascular:  mLAD 99%-0% Synergy DES 2.75 x 16 (3.0 mm)  . ENDARTERECTOMY Right 10/07/2019   Procedure: ENDARTERECTOMY CAROTID RIGHT;  Surgeon: Marty Heck, MD;  Location: [place];  Service: Vascular;  Laterality: Right;  . ESOPHAGOGASTRODUODENOSCOPY N/A 09/23/2013   Procedure: ESOPHAGOGASTRODUODENOSCOPY (EGD);  Surgeon: Lafayette Dragon, MD;  [facility] ENDOSCOPY;  Service: Endoscopy;  Laterality: N/A;  . ESOPHAGOGASTRODUODENOSCOPY N/A 09/25/2013   Procedure: ESOPHAGOGASTRODUODENOSCOPY (EGD);  Surgeon: Beryle Beams, MD;  Location: [facility] ENDOSCOPY;  Service: Endoscopy;  Laterality: N/A;  . ESOPHAGOGASTRODUODENOSCOPY N/A 12/18/2013   Procedure: ESOPHAGOGASTRODUODENOSCOPY (EGD);  Surgeon: Beryle Beams, MD;  Location: [facility];  Service: Endoscopy;  Laterality: N/A;  . LEFT HEART CATH AND CORONARY ANGIOGRAPHY N/A 12/11/2016   Procedure: Left Heart Cath and Coronary Angiography;  Surgeon: Leonie Man, MD;  Location: [place] CV LAB;  Service: Cardiovascular: LM 45%. mLAD 99% (PCI), dLAD 40% & 50%, ostD1 - 50%. Ost-prox Cx 60%, OstOM1 60% (Med Rx).  Anteroapical HK - EF 50-55%. Normal LVEDP  . TOTAL HIP ARTHROPLASTY Right 04/28/2013   Procedure: RIGHT TOTAL HIP ARTHROPLASTY ANTERIOR APPROACH;  Surgeon: Mauri Pole, MD;  Location: [facility];  Service: Orthopedics;  Laterality: Right;  . TRANSTHORACIC ECHOCARDIOGRAM  12/12/2016   a) s/p AntSTEMI:  Nl LV size & fnx. EF 60-65%, no RWMA. GR 2 DD. Mild LA dilation;;; b) 09/04/2019 TTE for CVA: EF 55-60%, No RWMA.  GR 1-2 DD. Normal Atrial Size. Normal Valve Dz.  . TRANSTHORACIC ECHOCARDIOGRAM  01/2020   [facility]--[facility]: EF 55-60%.  Mild concentric LVH.  Mild LA dilation.  Mild-moderate MR.  Mild TR.  Aortic sclerosis with no stenosis.    Outpatient Medications Prior to Visit  Medication Sig Dispense Refill  . clopidogrel (PLAVIX)  75 MG tablet TAKE 1 TABLET EVERY DAY WITH BREAKFAST. 90 tablet 1  . ergocalciferol (VITAMIN D2) 50000 UNITS capsule Take 50,000 Units by mouth once a week. Patient takes on Fridays    . GLYXAMBI 10-5 MG TABS Take 1 tablet by mouth daily.    Marland Kitchen LANTUS SOLOSTAR 100 UNIT/ML Solostar Pen Inject 15 Units into the skin daily.     Marland Kitchen levothyroxine (SYNTHROID, LEVOTHROID) 50 MCG tablet Take 50 mcg by mouth daily before breakfast.    . metoprolol tartrate (LOPRESSOR) 25 MG tablet Take 25 mg by mouth 2 (two) times daily.    . nitroGLYCERIN (NITROSTAT) 0.4 MG SL tablet Place 1 tablet (0.4 mg total) under the tongue every 5 (five) minutes x 3 doses as needed for chest pain. 25 tablet 12  . omeprazole (PRILOSEC) 20 MG capsule Take 20 mg by mouth 2 (two) times daily.    . rosuvastatin (CRESTOR) 20 MG tablet Take 20  mg by mouth every morning.     . clopidogrel (PLAVIX) 75 MG tablet Take 75 mg by mouth daily. (Patient not taking: Reported on 06/01/2020)    . Empagliflozin-linaGLIPtin (GLYXAMBI) 10-5 MG TABS Take 1 tablet by mouth daily.  (Patient not taking: Reported on 06/01/2020)    . levothyroxine (SYNTHROID) 50 MCG tablet Take 50 mcg by mouth daily. (Patient not taking: Reported on 06/01/2020)    . metoprolol tartrate (LOPRESSOR) 25 MG tablet Take 1 tablet (25 mg total) by mouth 2 (two) times daily. (Patient not taking: Reported on 06/01/2020) 180 tablet 1  . omeprazole (PRILOSEC) 20 MG capsule Take 1 capsule (20 mg total) by mouth 2 (two) times daily before a meal. (Patient not taking: Reported on 06/01/2020) 40 capsule 0  . rosuvastatin (CRESTOR) 20 MG tablet Take 10 mg by mouth daily. (Patient not taking: Reported on 06/01/2020)     No facility-administered medications prior to visit.    Allergies  Allergen Reactions  . Adhesive [Tape] Rash    Zoll pads adhesive    Family History  Problem Relation Age of Onset  . Dementia Mother   . Cancer Father        abdominal cancer    Social History   Tobacco Use  .  Smoking status: Never Smoker  . Smokeless tobacco: Never Used  Substance Use Topics  . Alcohol use: Yes    Alcohol/week: 3.0 standard drinks    Types: 3 Shots of liquor per week    Comment: wine or vodka   . Drug use: No    ROS: As per history of present illness, otherwise negative  BP 140/80 (BP Location: Right Arm, Patient Position: Sitting)   Pulse 74   Ht 5\' 9"  (1.753 m)   Wt 163 lb (73.9 kg)   SpO2 98%   BMI 24.07 kg/m  Gen: awake, alert, NAD HEENT: anicteric CV: RRR Pulm: CTA b/l Abd: soft, NT/ND, +BS throughout Ext: no c/c/e Neuro: nonfocal   RELEVANT LABS AND IMAGING: CBC    Component Value Date/Time   WBC 11.6 (H) 06/01/2020 1144   RBC 4.10 (L) 06/01/2020 1144   HGB 10.8 (L) 06/01/2020 1144   HCT 33.7 (L) 06/01/2020 1144   PLT 370.0 06/01/2020 1144   MCV 82.2 06/01/2020 1144   MCH 32.9 01/31/2020 1847   MCHC 32.0 06/01/2020 1144   RDW 18.9 (H) 06/01/2020 1144   LYMPHSABS 2.4 06/01/2020 1144   MONOABS 1.5 (H) 06/01/2020 1144   EOSABS 0.1 06/01/2020 1144   BASOSABS 0.1 06/01/2020 1144    CMP     Component Value Date/Time   NA 136 01/31/2020 1847   K 4.0 01/31/2020 1847   CL 103 01/31/2020 1847   CO2 21 (L) 01/31/2020 1847   GLUCOSE 204 (H) 01/31/2020 1847   BUN 25 (H) 01/31/2020 1847   CREATININE 0.99 01/31/2020 1847   CALCIUM 9.6 01/31/2020 1847   PROT 7.1 01/31/2020 1847   ALBUMIN 4.0 01/31/2020 1847   AST 35 01/31/2020 1847   ALT 25 01/31/2020 1847   ALKPHOS 65 01/31/2020 1847   BILITOT 1.0 01/31/2020 1847   GFRNONAA >60 01/31/2020 1847   GFRAA >60 01/31/2020 1847   Iron/TIBC/Ferritin/ %Sat    Component Value Date/Time   IRON 42 06/01/2020 1144   FERRITIN 17.2 (L) 06/01/2020 1144   IRONPCTSAT 8.6 (L) 06/01/2020 1144    ASSESSMENT/PLAN: 86 year old male with a past medical history of bleeding duodenal ulcer, erosive esophagitis, CAD currently on Plavix and previously  on Eliquis, prior stroke, history of atrial fibrillation,  hypertension, hyperlipidemia, hypothyroidism, CKD who presents to follow-up hospitalization for bleeding duodenal ulcer.   1.  Bleeding duodenal ulcer --he had an acute upper GI bleed due to duodenal ulcer disease with visible vessel treated endoscopically in May 2021.  This is nearly identical to his presentation with the same diagnosis in December 2014.  H. pylori serology was checked and negative and he was not using NSAIDs.  He also had erosive esophagitis and so I am suspicious for a hyper acid state.  He has had no further bleeding and Plavix has been resumed though he has remained off Eliquis.  I do think he should remain on lifelong PPI therapy.  His daughter will clarify which therapy he is currently on and we can decide what the best dose is going forward.  We discussed repeat upper endoscopy to document healing, but I am not certain this would change management at this time.  If cardiology would like to resume Eliquis then we may consider repeat upper endoscopy to document healing before the addition of an anticoagulant in addition to his antiplatelet therapy.  --Document PPI but continue at least full dose daily PPI --CBC and iron studies; hemoglobin is improving but remains low.  He has developed iron deficiency after GI bleeding and will need iron supplement. --Begin ferrous sulfate 325 mg daily, add stool softener if constipation develops.  If he cannot tolerate and I would recommend IV iron      UP:BDHDI, Annie Main, [place],  [place] [id number]

## 2020-06-02 DIAGNOSIS — R2689 Other abnormalities of gait and mobility: Secondary | ICD-10-CM | POA: Diagnosis not present

## 2020-06-02 DIAGNOSIS — J189 Pneumonia, unspecified organism: Secondary | ICD-10-CM | POA: Diagnosis not present

## 2020-06-02 DIAGNOSIS — M6281 Muscle weakness (generalized): Secondary | ICD-10-CM | POA: Diagnosis not present

## 2020-07-01 DIAGNOSIS — N390 Urinary tract infection, site not specified: Secondary | ICD-10-CM | POA: Diagnosis not present

## 2020-07-01 DIAGNOSIS — R300 Dysuria: Secondary | ICD-10-CM | POA: Diagnosis not present

## 2020-07-26 DIAGNOSIS — Z23 Encounter for immunization: Secondary | ICD-10-CM | POA: Diagnosis not present

## 2020-08-09 DIAGNOSIS — D500 Iron deficiency anemia secondary to blood loss (chronic): Secondary | ICD-10-CM | POA: Diagnosis not present

## 2020-08-09 DIAGNOSIS — E559 Vitamin D deficiency, unspecified: Secondary | ICD-10-CM | POA: Diagnosis not present

## 2020-08-09 DIAGNOSIS — R269 Unspecified abnormalities of gait and mobility: Secondary | ICD-10-CM | POA: Diagnosis not present

## 2020-08-09 DIAGNOSIS — E039 Hypothyroidism, unspecified: Secondary | ICD-10-CM | POA: Diagnosis not present

## 2020-08-09 DIAGNOSIS — I4891 Unspecified atrial fibrillation: Secondary | ICD-10-CM | POA: Diagnosis not present

## 2020-08-09 DIAGNOSIS — I639 Cerebral infarction, unspecified: Secondary | ICD-10-CM | POA: Diagnosis not present

## 2020-08-09 DIAGNOSIS — I2510 Atherosclerotic heart disease of native coronary artery without angina pectoris: Secondary | ICD-10-CM | POA: Diagnosis not present

## 2020-08-09 DIAGNOSIS — E785 Hyperlipidemia, unspecified: Secondary | ICD-10-CM | POA: Diagnosis not present

## 2020-08-09 DIAGNOSIS — N1831 Chronic kidney disease, stage 3a: Secondary | ICD-10-CM | POA: Diagnosis not present

## 2020-08-09 DIAGNOSIS — E1129 Type 2 diabetes mellitus with other diabetic kidney complication: Secondary | ICD-10-CM | POA: Diagnosis not present

## 2020-08-09 DIAGNOSIS — I6529 Occlusion and stenosis of unspecified carotid artery: Secondary | ICD-10-CM | POA: Diagnosis not present

## 2020-08-09 DIAGNOSIS — I10 Essential (primary) hypertension: Secondary | ICD-10-CM | POA: Diagnosis not present

## 2020-09-01 ENCOUNTER — Other Ambulatory Visit: Payer: Self-pay

## 2020-09-01 DIAGNOSIS — D509 Iron deficiency anemia, unspecified: Secondary | ICD-10-CM

## 2020-09-02 ENCOUNTER — Other Ambulatory Visit (INDEPENDENT_AMBULATORY_CARE_PROVIDER_SITE_OTHER): Payer: Medicare Other

## 2020-09-02 DIAGNOSIS — D509 Iron deficiency anemia, unspecified: Secondary | ICD-10-CM

## 2020-09-02 LAB — CBC WITH DIFFERENTIAL/PLATELET
Basophils Absolute: 0.1 10*3/uL (ref 0.0–0.1)
Basophils Relative: 0.9 % (ref 0.0–3.0)
Eosinophils Absolute: 0.1 10*3/uL (ref 0.0–0.7)
Eosinophils Relative: 1.3 % (ref 0.0–5.0)
HCT: 40.4 % (ref 39.0–52.0)
Hemoglobin: 13.6 g/dL (ref 13.0–17.0)
Lymphocytes Relative: 23.4 % (ref 12.0–46.0)
Lymphs Abs: 2.1 10*3/uL (ref 0.7–4.0)
MCHC: 33.6 g/dL (ref 30.0–36.0)
MCV: 93.7 fl (ref 78.0–100.0)
Monocytes Absolute: 1.3 10*3/uL — ABNORMAL HIGH (ref 0.1–1.0)
Monocytes Relative: 14 % — ABNORMAL HIGH (ref 3.0–12.0)
Neutro Abs: 5.4 10*3/uL (ref 1.4–7.7)
Neutrophils Relative %: 60.4 % (ref 43.0–77.0)
Platelets: 264 10*3/uL (ref 150.0–400.0)
RBC: 4.31 Mil/uL (ref 4.22–5.81)
RDW: 16.6 % — ABNORMAL HIGH (ref 11.5–15.5)
WBC: 9 10*3/uL (ref 4.0–10.5)

## 2020-09-02 LAB — IBC + FERRITIN
Ferritin: 72.6 ng/mL (ref 22.0–322.0)
Iron: 51 ug/dL (ref 42–165)
Saturation Ratios: 15.4 % — ABNORMAL LOW (ref 20.0–50.0)
Transferrin: 236 mg/dL (ref 212.0–360.0)

## 2020-09-05 ENCOUNTER — Other Ambulatory Visit: Payer: Self-pay | Admitting: Cardiology

## 2020-09-05 ENCOUNTER — Other Ambulatory Visit: Payer: Self-pay

## 2020-09-05 DIAGNOSIS — D485 Neoplasm of uncertain behavior of skin: Secondary | ICD-10-CM | POA: Diagnosis not present

## 2020-09-05 DIAGNOSIS — Z85828 Personal history of other malignant neoplasm of skin: Secondary | ICD-10-CM | POA: Diagnosis not present

## 2020-09-05 DIAGNOSIS — C44319 Basal cell carcinoma of skin of other parts of face: Secondary | ICD-10-CM | POA: Diagnosis not present

## 2020-09-05 DIAGNOSIS — L570 Actinic keratosis: Secondary | ICD-10-CM | POA: Diagnosis not present

## 2020-09-05 DIAGNOSIS — D509 Iron deficiency anemia, unspecified: Secondary | ICD-10-CM

## 2020-10-03 ENCOUNTER — Other Ambulatory Visit: Payer: Self-pay | Admitting: Cardiology

## 2020-10-12 ENCOUNTER — Ambulatory Visit (INDEPENDENT_AMBULATORY_CARE_PROVIDER_SITE_OTHER): Payer: Medicare Other | Admitting: Cardiology

## 2020-10-12 ENCOUNTER — Other Ambulatory Visit: Payer: Self-pay

## 2020-10-12 ENCOUNTER — Encounter: Payer: Self-pay | Admitting: Cardiology

## 2020-10-12 VITALS — BP 116/61 | HR 63 | Ht 69.0 in | Wt 162.6 lb

## 2020-10-12 DIAGNOSIS — Z8719 Personal history of other diseases of the digestive system: Secondary | ICD-10-CM

## 2020-10-12 DIAGNOSIS — E785 Hyperlipidemia, unspecified: Secondary | ICD-10-CM | POA: Diagnosis not present

## 2020-10-12 DIAGNOSIS — Z9861 Coronary angioplasty status: Secondary | ICD-10-CM | POA: Diagnosis not present

## 2020-10-12 DIAGNOSIS — E1169 Type 2 diabetes mellitus with other specified complication: Secondary | ICD-10-CM

## 2020-10-12 DIAGNOSIS — I480 Paroxysmal atrial fibrillation: Secondary | ICD-10-CM | POA: Diagnosis not present

## 2020-10-12 DIAGNOSIS — I2102 ST elevation (STEMI) myocardial infarction involving left anterior descending coronary artery: Secondary | ICD-10-CM

## 2020-10-12 DIAGNOSIS — I2510 Atherosclerotic heart disease of native coronary artery without angina pectoris: Secondary | ICD-10-CM

## 2020-10-12 MED ORDER — APIXABAN 5 MG PO TABS: 5.0000 mg | ORAL_TABLET | Freq: Two times a day (BID) | ORAL | 2 refills | Status: DC

## 2020-10-12 NOTE — Progress Notes (Signed)
Primary Care Provider: Reynold Bowen, MD Cardiologist: Glenetta Hew, MD Electrophysiologist: None  Clinic Note: Chief Complaint  Patient presents with  . Follow-up    72-month  . Coronary Artery Disease    No angina  . Atrial Fibrillation    No recurrent symptoms; now bleeding    Problem List Items Addressed This Visit    ST elevation myocardial infarction (STEMI) involving left anterior descending (LAD) coronary artery without development of Q waves (HCC) (Chronic)    His presentation is a.  He had a relatively large anterior MI with PCI to the LAD.  We are treating his circumflex and OM1 disease medically.  No further anginal symptoms.  EF seem to improved back to baseline on post MI with no CHF symptoms.  Doing well on beta-blocker and statin.      Relevant Medications   apixaban (ELIQUIS) 5 MG TABS tablet   CAD S/P percutaneous coronary angioplasty - Primary (Chronic)    Near subtotal occlusion of the LAD treated with DES stent.  He has moderate distal left main and ostial LCx disease as well as OM1.  We are treating that all medically.  Provided he has no further symptoms, would prefer to avoid further PCI.  This will allow [place] to use Eliquis as opposed to Plavix.  Plan:  Converted from Plavix to Eliquis upon completion of current bottle of Plavix  Continue beta-blocker at current dose  Continue statin  Continue empagliflozin/linagliptin      Relevant Medications   apixaban (ELIQUIS) 5 MG TABS tablet   Other Relevant Orders   EKG 12-Lead (Completed)   Lipid panel   Comprehensive metabolic panel   Lipid panel   Comprehensive metabolic panel   PAF (paroxysmal atrial fibrillation) (HCC): CHA2DS2-VASc score 7 (Chronic)    This patients CHA2DS2-VASc Score and unadjusted Ischemic Stroke Rate (% per year) is equal to 11.2 % stroke rate/year from a score of 7  Above score calculated as 1 point each if present [CHF, HTN, DM, Vascular=MI/PAD/Aortic Plaque, Age if  65-74, or Male] Above score calculated as 2 points each if present [Age > 75, or Stroke/TIA/TE]  Thankfully, since his hospital visit he has not had any breakthrough episodes. He is on beta-blocker to control heart rate.-Unexpectedly in the setting of GI bleed and beta-blocker being held, he went to rapid A. Fib.  At this point, these Fornoff out from his GI bleed and CEA to be can switch him from Plavix back to Eliquis alone.  Plan:   Continue Plavix until current bottle complete and then switch to Eliquis 5 mg twice daily.  Continue metoprolol 25 mg twice daily      Relevant Medications   apixaban (ELIQUIS) 5 MG TABS tablet   Other Relevant Orders   EKG 12-Lead (Completed)   History of GI bleed (Chronic)    Fairly significant GI bleed in May but no further episodes.  He is followed about PCI And Carotid Endarterectomy that we can switch from Plavix back to Eliquis alone.   Plan: DC Plavix upon completion of current bottle and then restart Eliquis. Eliquis as a lower GI bleed risk than either aspirin or Plavix.      Hyperlipidemia associated with type 2 diabetes mellitus (HCC) (Chronic)    Remains on rosuvastatin 20 mg daily.  Labs should be due to be checked soon.  Last labs were in December 2020, and LDL was 38.  Plan: Continue rosuvastatin along with empagliflozin-linagliptin with Lantus.   =>  Diabetes and lipids monitored by PCP      Relevant Orders   Lipid panel   Comprehensive metabolic panel   Lipid panel   Comprehensive metabolic panel    Other Visit Diagnoses    Dyslipidemia, goal LDL below 70       Relevant Medications   apixaban (ELIQUIS) 5 MG TABS tablet   Other Relevant Orders   Lipid panel   Comprehensive metabolic panel   Lipid panel   Comprehensive metabolic panel      HPI:    [name] is a 87 y.o. male with a cardiac history.  Below who presents today for 70-month follow-up.   S/p Anterior STEMI in March 2018.  He has a history of a  major GI bleed requiring transfusion in 2014 &  Ischemic stroke in 2012 with carotid artery disease. --> ? Recent CVA with right carotid disease-RCA December 2020; was started on Eliquis (however etiology was felt to be related to carotid disease) ? 1/6-03/2020: Admitted for R Carotid R CEA  He has hypertension and diet-controlled diabetes.  GI bleed Feb 03, 2020   [name] was last seen on April 12, 2020 as hospital follow-up from GI bleed GI bleed (initially seen on May 2nd admitted to [facility] ER-treated for enteritis) -> then was admitted on May 5th to [facility].  GI bleed (hemoglobin down to 9.7 on arrival (total 6 g drop in hemoglobin).  Both Eliquis and Plavix were stopped-treated with transfusion and volume resuscitation.  Also beta-blocker held. -> EGD showed diffuse erosive esophagitis and actively bleeding ulcer in the duodenum => treated with endoclips and epinephrine.  Also multiple scattered duodenal bulb ulcers no active bleeding. => 48 hours IV Protonix. => Plan was to hold Plavix and NSAIDs/DOAC for 6 weeks.  Discharge Hgb 8.4. -> He had rapid A. fib controlled by beta-blocker.  Led to positive troponin. ==> On clinic follow-up he was doing relatively well.  Did indicate that he was having some chest discomfort and was dyspneic when he was tachycardic and anemic, but had not had any issues since.  Noted that he was having slow down movement during rehab because of dyspnea but this was improving.  Only did 1 nitroglycerin since discharge.  He lost a lot of weight while hospitalized, and was feeling quite weak.  Plan was to stop clopidogrel immediately if he had any blood in his stool.  Recent Hospitalizations:   05/13/2020: ER visit for a fall where he lost his balance-fell on his head (apparently he got his walker stuck in a grocery bag, try to push through it and it got caught, causing him to fall.  No LOC.  Reviewed  CV studies:    The following studies  were reviewed today: (if available, images/films reviewed: From Epic Chart or Care Everywhere) . None:   Interval History:   [name] returns today doing well.  No major issues.  He still walks with a walker and has a somewhat unsteady gait, but was frustrated about his fall back in August.  He said that it was silly, and he did not want to go to the emergency room.  He is recovering quite well and has had stable hemoglobin levels.  He has not had any further GI bleed issues of any blood in his stool darker stools.  He has not had any breakthrough episodes of rapid irregular heartbeats or palpitations.  No chest pain or pressure with rest or  exertion.  CV Review of Symptoms (Summary): no chest pain or dyspnea on exertion positive for - Still a bit slow getting things done, but walks routinely.  He says that his room is better as far from anything in the complex that it can be.  No chest pain or dyspnea. negative for - edema, irregular heartbeat, orthopnea, palpitations, paroxysmal nocturnal dyspnea, rapid heart rate, shortness of breath or Syncope or near syncope, TIA/amaurosis fugax, claudication.  No melena, hematochezia, or hematuria.  Stacks's.  The patient does not have symptoms concerning for COVID-19 infection (fever, chills, cough, or new shortness of breath).   REVIEWED OF SYSTEMS   Review of Systems  Constitutional: Negative for malaise/fatigue (Energy level is much back to where it had been.  Slow progress but pretty much there.) and weight loss (Stabilizing his weight now.).  HENT: Positive for hearing loss. Negative for congestion and nosebleeds.   Respiratory: Negative for cough, shortness of breath and wheezing.   Gastrointestinal: Negative for abdominal pain and diarrhea.  Musculoskeletal: Positive for joint pain (Knee still bother him some.). Negative for back pain, falls (Only the one mechanical fall noted in HPI) and myalgias.       Walks with care.  Neurological:  Negative for dizziness, focal weakness and headaches.  Psychiatric/Behavioral: Positive for memory loss. Negative for depression. The patient is not nervous/anxious and does not have insomnia.    I have reviewed and (if needed) personally updated the patient's problem list, medications, allergies, past medical and surgical history, social and family history.   PAST MEDICAL HISTORY   Past Medical History:  Diagnosis Date  . Anemia   . Aortic atherosclerosis (HCC)   . Arthritis   . CAD S/P percutaneous coronary angioplasty    a. 11/2016: anterior STEMI s/p DES to LAD.   Marland Kitchen. CKD (chronic kidney disease), stage III (HCC)   . Diabetes mellitus without complication (HCC)    Diet controlled  . Diverticulosis   . Duodenal ulcer   . Erosive esophagitis   . GERD (gastroesophageal reflux disease)   . GI bleed    10/2013: 2/2 duodenal ulcer. ASA/plavix discontinued.   . Hemorrhagic shock (HCC)   . Hyperlipemia   . Hypertension   . Hypothyroidism   . NSTEMI (non-ST elevated myocardial infarction) (HCC) 2021  . Osteoarthritis   . PAF (paroxysmal atrial fibrillation) (HCC)    a. noted during admission in 10/2013 for acute GI bleed. No recurrunce since. Monitor in 2014 with only PAC/PVCs  . Peripheral vascular disease (HCC)   . ST elevation myocardial infarction (STEMI) involving left anterior descending (LAD) coronary artery with complication (HCC) 12/11/2016   - 99% subtotal occlusion of LAD treated with DES stent.  . Stroke [facility](HCC)    a. ichemic CVA in 2012.   . Vitamin D deficiency     PAST SURGICAL HISTORY   Past Surgical History:  Procedure Laterality Date  . BASAL CELL CARCINOMA EXCISION  2000   face  . BASAL CELL CARCINOMA EXCISION  2011   scalp  . CARPAL TUNNEL RELEASE Right 2002  . CORONARY STENT INTERVENTION N/A 12/11/2016   Procedure: Coronary Stent Intervention-Mid LAD;  Surgeon: Marykay Lexavid W , MD;  [facility];  Service: Cardiovascular:  mLAD 99%-0% Synergy  DES 2.75 x 16 (3.0 mm)  . ENDARTERECTOMY Right 10/07/2019   Procedure: ENDARTERECTOMY CAROTID RIGHT;  Surgeon: Cephus Shellinglark, Christopher J, MD;  [facility];  Service: Vascular;  Laterality: Right;  . ESOPHAGOGASTRODUODENOSCOPY N/A 09/23/2013  Procedure: ESOPHAGOGASTRODUODENOSCOPY (EGD);  Surgeon: Hart Carwinora M Brodie, MD;  [facility];  Service: Endoscopy;  Laterality: N/A;  . ESOPHAGOGASTRODUODENOSCOPY N/A 09/25/2013   Procedure: ESOPHAGOGASTRODUODENOSCOPY (EGD);  Surgeon: Theda BelfastPatrick D Hung, MD;  [facility];  Service: Endoscopy;  Laterality: N/A;  . ESOPHAGOGASTRODUODENOSCOPY N/A 12/18/2013   Procedure: ESOPHAGOGASTRODUODENOSCOPY (EGD);  Surgeon: Theda BelfastPatrick D Hung, MD;  [facility];  Service: Endoscopy;  Laterality: N/A;  . LEFT HEART CATH AND CORONARY ANGIOGRAPHY N/A 12/11/2016   Procedure: Left Heart Cath and Coronary Angiography;  Surgeon: Marykay Lexavid W , MD;  [facility];  Service: Cardiovascular: LM 45%. mLAD 99% (PCI), dLAD 40% & 50%, ostD1 - 50%. Ost-prox Cx 60%, OstOM1 60% (Med Rx).  Anteroapical HK - EF 50-55%. Normal LVEDP  . TOTAL HIP ARTHROPLASTY Right 04/28/2013   Procedure: RIGHT TOTAL HIP ARTHROPLASTY ANTERIOR APPROACH;  Surgeon: Shelda PalMatthew D Olin, MD;  Location: [facility] ORS;  Service: Orthopedics;  Laterality: Right;  . TRANSTHORACIC ECHOCARDIOGRAM  12/12/2016   a) s/p AntSTEMI:  Nl LV size & fnx. EF 60-65%, no RWMA. GR 2 DD. Mild LA dilation;;; b) 09/04/2019 TTE for CVA: EF 55-60%, No RWMA.  GR 1-2 DD. Normal Atrial Size. Normal Valve Dz.  . TRANSTHORACIC ECHOCARDIOGRAM  01/2020   [facility]--[facility]: EF 55-60%.  Mild concentric LVH.  Mild LA dilation.  Mild-moderate MR.  Mild TR.  Aortic sclerosis with no stenosis.     Cardiac Cath-PCI 12/11/2016: EF 50-50%: Culprit 99% mLAD- Synergy DES 2.75 x 16 (3.0 mm); 45% mid-distal LM, ostial-proximal LCx 60% associated with ostial OM1 60%. ,   Immunization History  Administered Date(s) Administered  . Pneumococcal  Polysaccharide-23 09/25/2013    MEDICATIONS/ALLERGIES   Current Meds  Medication Sig  . apixaban (ELIQUIS) 5 MG TABS tablet Take 1 tablet (5 mg total) by mouth 2 (two) times daily.  . BD PEN NEEDLE NANO U/F 32G X 4 MM MISC SMARTSIG:1 Each SUB-Q Daily  . ergocalciferol (VITAMIN D2) 50000 UNITS capsule Take 50,000 Units by mouth once a week. Patient takes on Fridays  . GLYXAMBI 10-5 MG TABS Take 1 tablet by mouth daily.  Marland Kitchen. LANTUS SOLOSTAR 100 UNIT/ML Solostar Pen Inject 15 Units into the skin daily.   Marland Kitchen. levothyroxine (SYNTHROID, LEVOTHROID) 50 MCG tablet Take 50 mcg by mouth daily before breakfast.  . metoprolol tartrate (LOPRESSOR) 25 MG tablet Take 25 mg by mouth 2 (two) times daily.  . nitroGLYCERIN (NITROSTAT) 0.4 MG SL tablet Place 1 tablet (0.4 mg total) under the tongue every 5 (five) minutes x 3 doses as needed for chest pain.  Marland Kitchen. omeprazole (PRILOSEC) 20 MG capsule Take 20 mg by mouth 2 (two) times daily.  . rosuvastatin (CRESTOR) 20 MG tablet Take 20 mg by mouth every morning.   . [DISCONTINUED] clopidogrel (PLAVIX) 75 MG tablet TAKE 1 TABLET EVERY DAY WITH BREAKFAST.    Allergies  Allergen Reactions  . Adhesive [Tape] Rash    Zoll pads adhesive    SOCIAL HISTORY/FAMILY HISTORY   Reviewed in Epic:  Pertinent findings:  Social History   Tobacco Use  . Smoking status: Never Smoker  . Smokeless tobacco: Never Used  Substance Use Topics  . Alcohol use: Yes    Alcohol/week: 3.0 standard drinks    Types: 3 Shots of liquor per week    Comment: wine or vodka   . Drug use: No   Social History   Social History Narrative  . Not on file    OBJCTIVE -PE, EKG, labs  Wt Readings from Last 3 Encounters:  10/12/20 162 lb 9.6 oz (73.8 kg)  06/01/20 163 lb (73.9 kg)  04/12/20 159 lb 6.4 oz (72.3 kg)    Physical Exam: BP 116/61 (BP Location: Left Arm, Patient Position: Sitting)   Pulse 63   Ht 5\' 9"  (1.753 m)   Wt 162 lb 9.6 oz (73.8 kg)   SpO2 97%   BMI 24.01 kg/m   Physical Exam Vitals reviewed.  Constitutional:      General: He is not in acute distress.    Appearance: Normal appearance. He is normal weight. He is not ill-appearing or toxic-appearing.     Comments: Somewhat thin and frail elderly gentleman.  Relatively stable for stated age however.  Weight is better.  HENT:     Head: Normocephalic and atraumatic.  Neck:     Vascular: No carotid bruit (Right CEA scar well-healed), hepatojugular reflux or JVD.  Cardiovascular:     Rate and Rhythm: Normal rate and regular rhythm. Occasional extrasystoles are present.    Pulses: Intact distal pulses.     Heart sounds: Murmur (1/6 SEM at RUSB) heard.  No friction rub. No gallop.   Pulmonary:     Effort: Pulmonary effort is normal. No respiratory distress.     Breath sounds: Normal breath sounds.  Chest:     Chest wall: No tenderness.  Musculoskeletal:        General: Swelling (Stable) present.     Cervical back: Normal range of motion and neck supple.     Right lower leg: Edema (Trace) present.     Left lower leg: Edema (1+) present.  Neurological:     General: No focal deficit present.     Mental Status: He is alert and oriented to person, place, and time. Mental status is at baseline.     Cranial Nerves: No cranial nerve deficit.     Gait: Gait abnormal (Still has a slow somewhat unsteady wide-based gait-stable).  Psychiatric:        Mood and Affect: Mood normal.        Behavior: Behavior normal.        Thought Content: Thought content normal.        Judgment: Judgment normal.       Adult ECG Report  Rate: 63 ;  Rhythm: normal sinus rhythm and normal axis/intervals & durations;   Narrative Interpretation: normal EKG  Recent Labs:  due to labs checked soon. Lab Results  Component Value Date   CHOL 97 09/25/2019   HDL 30 (L) 09/25/2019   LDLCALC 38 09/25/2019   TRIG 145 09/25/2019   CHOLHDL 3.2 09/25/2019   Lab Results  Component Value Date   CREATININE 0.99 01/31/2020   BUN  25 (H) 01/31/2020   NA 136 01/31/2020   K 4.0 01/31/2020   CL 103 01/31/2020   CO2 21 (L) 01/31/2020   CBC Latest Ref Rng & Units 09/02/2020 06/01/2020 01/31/2020  WBC 4.0 - 10.5 K/uL 9.0 11.6(H) 13.1(H)  Hemoglobin 13.0 - 17.0 g/dL 13.6 10.8(L) 15.4  Hematocrit 39.0 - 52.0 % 40.4 33.7(L) 43.8  Platelets 150.0 - 400.0 K/uL 264.0 370.0 285  06/2020 - started FeSO4 - til Jan 2022  Lab Results  Component Value Date   TSH 1.451 09/24/2019    ASSESSMENT/PLAN    Problem List Items Addressed This Visit    ST elevation myocardial infarction (STEMI) involving left anterior descending (LAD) coronary artery without development of Q waves (HCC) (Chronic)  His presentation is a.  He had a relatively large anterior MI with PCI to the LAD.  We are treating his circumflex and OM1 disease medically.  No further anginal symptoms.  EF seem to improved back to baseline on post MI with no CHF symptoms.  Doing well on beta-blocker and statin.      Relevant Medications   apixaban (ELIQUIS) 5 MG TABS tablet   CAD S/P percutaneous coronary angioplasty - Primary (Chronic)    Near subtotal occlusion of the LAD treated with DES stent.  He has moderate distal left main and ostial LCx disease as well as OM1.  We are treating that all medically.  Provided he has no further symptoms, would prefer to avoid further PCI.  This will allow [place] to use Eliquis as opposed to Plavix.  Plan:  Converted from Plavix to Eliquis upon completion of current bottle of Plavix  Continue beta-blocker at current dose  Continue statin  Continue empagliflozin/linagliptin      Relevant Medications   apixaban (ELIQUIS) 5 MG TABS tablet   Other Relevant Orders   EKG 12-Lead (Completed)   Lipid panel   Comprehensive metabolic panel   Lipid panel   Comprehensive metabolic panel   PAF (paroxysmal atrial fibrillation) (HCC): CHA2DS2-VASc score 7 (Chronic)    This patients CHA2DS2-VASc Score and unadjusted Ischemic Stroke Rate  (% per year) is equal to 11.2 % stroke rate/year from a score of 7  Above score calculated as 1 point each if present [CHF, HTN, DM, Vascular=MI/PAD/Aortic Plaque, Age if 65-74, or Male] Above score calculated as 2 points each if present [Age > 75, or Stroke/TIA/TE]  Thankfully, since his hospital visit he has not had any breakthrough episodes. He is on beta-blocker to control heart rate.-Unexpectedly in the setting of GI bleed and beta-blocker being held, he went to rapid A. Fib.  At this point, these Fornoff out from his GI bleed and CEA to be can switch him from Plavix back to Eliquis alone.  Plan:   Continue Plavix until current bottle complete and then switch to Eliquis 5 mg twice daily.  Continue metoprolol 25 mg twice daily      Relevant Medications   apixaban (ELIQUIS) 5 MG TABS tablet   Other Relevant Orders   EKG 12-Lead (Completed)   History of GI bleed (Chronic)    Fairly significant GI bleed in May but no further episodes.  He is followed about PCI And Carotid Endarterectomy that we can switch from Plavix back to Eliquis alone.   Plan: DC Plavix upon completion of current bottle and then restart Eliquis. Eliquis as a lower GI bleed risk than either aspirin or Plavix.      Hyperlipidemia associated with type 2 diabetes mellitus (HCC) (Chronic)    Remains on rosuvastatin 20 mg daily.  Labs should be due to be checked soon.  Last labs were in December 2020, and LDL was 38.  Plan: Continue rosuvastatin along with empagliflozin-linagliptin with Lantus.   => Diabetes and lipids monitored by PCP      Relevant Orders   Lipid panel   Comprehensive metabolic panel   Lipid panel   Comprehensive metabolic panel    Other Visit Diagnoses    Dyslipidemia, goal LDL below 70       Relevant Medications   apixaban (ELIQUIS) 5 MG TABS tablet   Other Relevant Orders   Lipid panel   Comprehensive metabolic panel   Lipid panel   Comprehensive metabolic panel  COVID-19 Education: The signs and symptoms of COVID-19 were discussed with the patient and how to seek care for testing (follow up with PCP or arrange E-visit).   The importance of social distancing and COVID-19 vaccination was discussed today. The patient is practicing social distancing & Masking.   I spent a total of 37minutes with the patient spent in direct patient consultation.  Additional time spent with chart review  / charting (studies, outside notes, etc): 12 min Total Time: 38 min   Current medicines are reviewed at length with the patient today.  (+/- concerns) was not sure what the plan was about Plavix versus Eliquis  This visit occurred during the SARS-CoV-2 public health emergency.  Safety protocols were in place, including screening questions prior to the visit, additional usage of staff PPE, and extensive cleaning of exam room while observing appropriate contact time as indicated for disinfecting solutions.  Notice: This dictation was prepared with Dragon dictation along with smaller phrase technology. Any transcriptional errors that result from this process are unintentional and may not be corrected upon review.  Patient Instructions / Medication Changes & Studies & Tests Ordered   Patient Instructions  Medication Instructions:   complete this bottle of Clopidogrel  (plavix) then stop taking it.  Then start taking Eliquis 5 mg one tablet twice a day   *If you need a refill on your cardiac medications before your next appointment, please call your pharmacy*   Lab Work: Lipid- fasting. cmp    You get lab work done in April with other previous labwork schedule.  If you have labs (blood work) drawn today and your tests are completely normal, you will receive your results only by: Marland Kitchen MyChart Message (if you have MyChart) OR . A paper copy in the mail If you have any lab test that is abnormal or we need to change your treatment, we will call you to review the  results.   Testing/Procedures: Not needed   Follow-Up: At [facility], you and your health needs are our priority.  As part of our continuing mission to provide you with exceptional heart care, we have created designated Provider Care Teams.  These Care Teams include your primary Cardiologist (physician) and Advanced Practice Providers (APPs -  Physician Assistants and Nurse Practitioners) who all work together to provide you with the care you need, when you need it.  We recommend signing up for the patient portal called "MyChart".  Sign up information is provided on this After Visit Summary.  MyChart is used to connect with patients for Virtual Visits (Telemedicine).  Patients are able to view lab/test results, encounter notes, upcoming appointments, etc.  Non-urgent messages can be sent to your provider as well.   To learn more about what you can do with MyChart, go to [email or web address].    Your next appointment:   6 month(s)  The format for your next appointment:   In Person  Provider:   Glenetta Hew, MD    Studies Ordered:   Orders Placed This Encounter  Procedures  . Lipid panel  . Comprehensive metabolic panel  . Lipid panel  . Comprehensive metabolic panel  . EKG 12-Lead     Glenetta Hew, M.D., M.S. Interventional Cardiologist   Pager # [phone] Phone # [phone] [address] [place], [place] [address]   Thank you for choosing [facility] at [facility]!!

## 2020-10-12 NOTE — Patient Instructions (Signed)
Medication Instructions:   complete this bottle of Clopidogrel  (plavix) then stop taking it.  Then start taking Eliquis 5 mg one tablet twice a day   *If you need a refill on your cardiac medications before your next appointment, please call your pharmacy*   Lab Work: Lipid- fasting. cmp    You get lab work done in April with other previous labwork schedule.  If you have labs (blood work) drawn today and your tests are completely normal, you will receive your results only by: Marland Kitchen MyChart Message (if you have MyChart) OR . A paper copy in the mail If you have any lab test that is abnormal or we need to change your treatment, we will call you to review the results.   Testing/Procedures: Not needed   Follow-Up: At [facility], you and your health needs are our priority.  As part of our continuing mission to provide you with exceptional heart care, we have created designated Provider Care Teams.  These Care Teams include your primary Cardiologist (physician) and Advanced Practice Providers (APPs -  Physician Assistants and Nurse Practitioners) who all work together to provide you with the care you need, when you need it.  We recommend signing up for the patient portal called "MyChart".  Sign up information is provided on this After Visit Summary.  MyChart is used to connect with patients for Virtual Visits (Telemedicine).  Patients are able to view lab/test results, encounter notes, upcoming appointments, etc.  Non-urgent messages can be sent to your provider as well.   To learn more about what you can do with MyChart, go to [email or web address].    Your next appointment:   6 month(s)  The format for your next appointment:   In Person  Provider:   Glenetta Hew, MD

## 2020-10-23 ENCOUNTER — Encounter: Payer: Self-pay | Admitting: Cardiology

## 2020-10-23 NOTE — Assessment & Plan Note (Addendum)
Remains on rosuvastatin 20 mg daily.  Labs should be due to be checked soon.  Last labs were in December 2020, and LDL was 38.  Plan: Continue rosuvastatin along with empagliflozin-linagliptin with Lantus.   => Diabetes and lipids monitored by PCP

## 2020-10-23 NOTE — Assessment & Plan Note (Signed)
His presentation is a.  He had a relatively large anterior MI with PCI to the LAD.  We are treating his circumflex and OM1 disease medically.  No further anginal symptoms.  EF seem to improved back to baseline on post MI with no CHF symptoms.  Doing well on beta-blocker and statin.

## 2020-10-23 NOTE — Assessment & Plan Note (Addendum)
This patients CHA2DS2-VASc Score and unadjusted Ischemic Stroke Rate (% per year) is equal to 11.2 % stroke rate/year from a score of 7  Above score calculated as 1 point each if present [CHF, HTN, DM, Vascular=MI/PAD/Aortic Plaque, Age if 65-74, or Male] Above score calculated as 2 points each if present [Age > 75, or Stroke/TIA/TE]  Thankfully, since his hospital visit he has not had any breakthrough episodes. He is on beta-blocker to control heart rate.-Unexpectedly in the setting of GI bleed and beta-blocker being held, he went to rapid A. Fib.  At this point, these [place] out from his GI bleed and CEA to be can switch him from Plavix back to Eliquis alone.  Plan:   Continue Plavix until current bottle complete and then switch to Eliquis 5 mg twice daily.  Continue metoprolol 25 mg twice daily

## 2020-10-23 NOTE — Assessment & Plan Note (Addendum)
Near subtotal occlusion of the LAD treated with DES stent.  He has moderate distal left main and ostial LCx disease as well as OM1.  We are treating that all medically.  Provided he has no further symptoms, would prefer to avoid further PCI.  This will allow [place] to use Eliquis as opposed to Plavix.  Plan:  Converted from Plavix to Eliquis upon completion of current bottle of Plavix  Continue beta-blocker at current dose  Continue statin  Continue empagliflozin/linagliptin

## 2020-10-23 NOTE — Assessment & Plan Note (Signed)
Fairly significant GI bleed in May but no further episodes.  He is followed about PCI And Carotid Endarterectomy that we can switch from Plavix back to Eliquis alone.   Plan: DC Plavix upon completion of current bottle and then restart Eliquis. Eliquis as a lower GI bleed risk than either aspirin or Plavix.

## 2020-11-04 ENCOUNTER — Other Ambulatory Visit: Payer: Self-pay | Admitting: Cardiology

## 2020-11-05 ENCOUNTER — Other Ambulatory Visit: Payer: Self-pay | Admitting: Cardiology

## 2020-12-05 DIAGNOSIS — I4891 Unspecified atrial fibrillation: Secondary | ICD-10-CM | POA: Diagnosis not present

## 2020-12-05 DIAGNOSIS — Z7901 Long term (current) use of anticoagulants: Secondary | ICD-10-CM | POA: Diagnosis not present

## 2020-12-05 DIAGNOSIS — I2510 Atherosclerotic heart disease of native coronary artery without angina pectoris: Secondary | ICD-10-CM | POA: Diagnosis not present

## 2020-12-05 DIAGNOSIS — D500 Iron deficiency anemia secondary to blood loss (chronic): Secondary | ICD-10-CM | POA: Diagnosis not present

## 2020-12-05 DIAGNOSIS — G459 Transient cerebral ischemic attack, unspecified: Secondary | ICD-10-CM | POA: Diagnosis not present

## 2020-12-05 DIAGNOSIS — E785 Hyperlipidemia, unspecified: Secondary | ICD-10-CM | POA: Diagnosis not present

## 2020-12-05 DIAGNOSIS — Z79899 Other long term (current) drug therapy: Secondary | ICD-10-CM | POA: Diagnosis not present

## 2020-12-05 DIAGNOSIS — I119 Hypertensive heart disease without heart failure: Secondary | ICD-10-CM | POA: Diagnosis not present

## 2020-12-05 DIAGNOSIS — Z1389 Encounter for screening for other disorder: Secondary | ICD-10-CM | POA: Diagnosis not present

## 2020-12-05 DIAGNOSIS — E559 Vitamin D deficiency, unspecified: Secondary | ICD-10-CM | POA: Diagnosis not present

## 2020-12-05 DIAGNOSIS — Z794 Long term (current) use of insulin: Secondary | ICD-10-CM | POA: Diagnosis not present

## 2020-12-05 DIAGNOSIS — E039 Hypothyroidism, unspecified: Secondary | ICD-10-CM | POA: Diagnosis not present

## 2020-12-05 DIAGNOSIS — Z1331 Encounter for screening for depression: Secondary | ICD-10-CM | POA: Diagnosis not present

## 2020-12-05 DIAGNOSIS — N1831 Chronic kidney disease, stage 3a: Secondary | ICD-10-CM | POA: Diagnosis not present

## 2020-12-05 DIAGNOSIS — E1129 Type 2 diabetes mellitus with other diabetic kidney complication: Secondary | ICD-10-CM | POA: Diagnosis not present

## 2020-12-05 DIAGNOSIS — I6529 Occlusion and stenosis of unspecified carotid artery: Secondary | ICD-10-CM | POA: Diagnosis not present

## 2020-12-05 DIAGNOSIS — I639 Cerebral infarction, unspecified: Secondary | ICD-10-CM | POA: Diagnosis not present

## 2020-12-14 DIAGNOSIS — H53451 Other localized visual field defect, right eye: Secondary | ICD-10-CM | POA: Diagnosis not present

## 2020-12-14 DIAGNOSIS — H52223 Regular astigmatism, bilateral: Secondary | ICD-10-CM | POA: Diagnosis not present

## 2020-12-14 DIAGNOSIS — G453 Amaurosis fugax: Secondary | ICD-10-CM | POA: Diagnosis not present

## 2020-12-14 DIAGNOSIS — H524 Presbyopia: Secondary | ICD-10-CM | POA: Diagnosis not present

## 2020-12-14 DIAGNOSIS — H35033 Hypertensive retinopathy, bilateral: Secondary | ICD-10-CM | POA: Diagnosis not present

## 2020-12-14 DIAGNOSIS — E119 Type 2 diabetes mellitus without complications: Secondary | ICD-10-CM | POA: Diagnosis not present

## 2021-01-04 DIAGNOSIS — D044 Carcinoma in situ of skin of scalp and neck: Secondary | ICD-10-CM | POA: Diagnosis not present

## 2021-01-04 DIAGNOSIS — D485 Neoplasm of uncertain behavior of skin: Secondary | ICD-10-CM | POA: Diagnosis not present

## 2021-01-04 DIAGNOSIS — C4442 Squamous cell carcinoma of skin of scalp and neck: Secondary | ICD-10-CM | POA: Diagnosis not present

## 2021-01-04 DIAGNOSIS — L812 Freckles: Secondary | ICD-10-CM | POA: Diagnosis not present

## 2021-01-04 DIAGNOSIS — L821 Other seborrheic keratosis: Secondary | ICD-10-CM | POA: Diagnosis not present

## 2021-01-04 DIAGNOSIS — L570 Actinic keratosis: Secondary | ICD-10-CM | POA: Diagnosis not present

## 2021-01-04 DIAGNOSIS — C44319 Basal cell carcinoma of skin of other parts of face: Secondary | ICD-10-CM | POA: Diagnosis not present

## 2021-01-04 DIAGNOSIS — Z85828 Personal history of other malignant neoplasm of skin: Secondary | ICD-10-CM | POA: Diagnosis not present

## 2021-01-18 DIAGNOSIS — Z23 Encounter for immunization: Secondary | ICD-10-CM | POA: Diagnosis not present

## 2021-03-22 DIAGNOSIS — G453 Amaurosis fugax: Secondary | ICD-10-CM | POA: Diagnosis not present

## 2021-03-22 DIAGNOSIS — E119 Type 2 diabetes mellitus without complications: Secondary | ICD-10-CM | POA: Diagnosis not present

## 2021-03-22 DIAGNOSIS — H53451 Other localized visual field defect, right eye: Secondary | ICD-10-CM | POA: Diagnosis not present

## 2021-03-22 DIAGNOSIS — H35033 Hypertensive retinopathy, bilateral: Secondary | ICD-10-CM | POA: Diagnosis not present

## 2021-04-11 DIAGNOSIS — N1831 Chronic kidney disease, stage 3a: Secondary | ICD-10-CM | POA: Diagnosis not present

## 2021-04-11 DIAGNOSIS — E039 Hypothyroidism, unspecified: Secondary | ICD-10-CM | POA: Diagnosis not present

## 2021-04-11 DIAGNOSIS — E1129 Type 2 diabetes mellitus with other diabetic kidney complication: Secondary | ICD-10-CM | POA: Diagnosis not present

## 2021-04-11 DIAGNOSIS — I119 Hypertensive heart disease without heart failure: Secondary | ICD-10-CM | POA: Diagnosis not present

## 2021-04-11 DIAGNOSIS — Z7901 Long term (current) use of anticoagulants: Secondary | ICD-10-CM | POA: Diagnosis not present

## 2021-04-11 DIAGNOSIS — I700 Atherosclerosis of aorta: Secondary | ICD-10-CM | POA: Diagnosis not present

## 2021-04-11 DIAGNOSIS — E785 Hyperlipidemia, unspecified: Secondary | ICD-10-CM | POA: Diagnosis not present

## 2021-04-11 DIAGNOSIS — I4891 Unspecified atrial fibrillation: Secondary | ICD-10-CM | POA: Diagnosis not present

## 2021-04-11 DIAGNOSIS — R269 Unspecified abnormalities of gait and mobility: Secondary | ICD-10-CM | POA: Diagnosis not present

## 2021-04-11 DIAGNOSIS — I2510 Atherosclerotic heart disease of native coronary artery without angina pectoris: Secondary | ICD-10-CM | POA: Diagnosis not present

## 2021-04-11 DIAGNOSIS — D500 Iron deficiency anemia secondary to blood loss (chronic): Secondary | ICD-10-CM | POA: Diagnosis not present

## 2021-04-11 DIAGNOSIS — N390 Urinary tract infection, site not specified: Secondary | ICD-10-CM | POA: Diagnosis not present

## 2021-04-11 DIAGNOSIS — I6529 Occlusion and stenosis of unspecified carotid artery: Secondary | ICD-10-CM | POA: Diagnosis not present

## 2021-04-13 ENCOUNTER — Other Ambulatory Visit: Payer: Self-pay | Admitting: Cardiology

## 2021-04-17 ENCOUNTER — Other Ambulatory Visit: Payer: Self-pay | Admitting: Cardiology

## 2021-04-18 ENCOUNTER — Other Ambulatory Visit: Payer: Self-pay

## 2021-04-18 ENCOUNTER — Other Ambulatory Visit: Payer: Self-pay | Admitting: Cardiology

## 2021-04-20 DIAGNOSIS — L570 Actinic keratosis: Secondary | ICD-10-CM | POA: Diagnosis not present

## 2021-04-20 DIAGNOSIS — D485 Neoplasm of uncertain behavior of skin: Secondary | ICD-10-CM | POA: Diagnosis not present

## 2021-04-20 DIAGNOSIS — Z85828 Personal history of other malignant neoplasm of skin: Secondary | ICD-10-CM | POA: Diagnosis not present

## 2021-04-20 DIAGNOSIS — L989 Disorder of the skin and subcutaneous tissue, unspecified: Secondary | ICD-10-CM | POA: Diagnosis not present

## 2021-04-20 DIAGNOSIS — D044 Carcinoma in situ of skin of scalp and neck: Secondary | ICD-10-CM | POA: Diagnosis not present

## 2021-05-18 DIAGNOSIS — M25562 Pain in left knee: Secondary | ICD-10-CM | POA: Diagnosis not present

## 2021-05-18 DIAGNOSIS — M1712 Unilateral primary osteoarthritis, left knee: Secondary | ICD-10-CM | POA: Diagnosis not present

## 2021-05-18 DIAGNOSIS — M25561 Pain in right knee: Secondary | ICD-10-CM | POA: Diagnosis not present

## 2021-05-26 DIAGNOSIS — M1712 Unilateral primary osteoarthritis, left knee: Secondary | ICD-10-CM | POA: Diagnosis not present

## 2021-05-26 DIAGNOSIS — M1711 Unilateral primary osteoarthritis, right knee: Secondary | ICD-10-CM | POA: Diagnosis not present

## 2021-05-26 DIAGNOSIS — M25562 Pain in left knee: Secondary | ICD-10-CM | POA: Diagnosis not present

## 2021-05-26 DIAGNOSIS — M170 Bilateral primary osteoarthritis of knee: Secondary | ICD-10-CM | POA: Diagnosis not present

## 2021-06-01 DIAGNOSIS — M1711 Unilateral primary osteoarthritis, right knee: Secondary | ICD-10-CM | POA: Diagnosis not present

## 2021-06-01 DIAGNOSIS — M25561 Pain in right knee: Secondary | ICD-10-CM | POA: Diagnosis not present

## 2021-06-01 DIAGNOSIS — M1712 Unilateral primary osteoarthritis, left knee: Secondary | ICD-10-CM | POA: Diagnosis not present

## 2021-06-01 DIAGNOSIS — M170 Bilateral primary osteoarthritis of knee: Secondary | ICD-10-CM | POA: Diagnosis not present

## 2021-06-01 DIAGNOSIS — M25562 Pain in left knee: Secondary | ICD-10-CM | POA: Diagnosis not present

## 2021-06-07 DIAGNOSIS — M1711 Unilateral primary osteoarthritis, right knee: Secondary | ICD-10-CM | POA: Diagnosis not present

## 2021-06-07 DIAGNOSIS — M170 Bilateral primary osteoarthritis of knee: Secondary | ICD-10-CM | POA: Diagnosis not present

## 2021-06-07 DIAGNOSIS — M25562 Pain in left knee: Secondary | ICD-10-CM | POA: Diagnosis not present

## 2021-06-07 DIAGNOSIS — M1712 Unilateral primary osteoarthritis, left knee: Secondary | ICD-10-CM | POA: Diagnosis not present

## 2021-06-07 DIAGNOSIS — M25561 Pain in right knee: Secondary | ICD-10-CM | POA: Diagnosis not present

## 2021-07-04 ENCOUNTER — Other Ambulatory Visit: Payer: Self-pay | Admitting: Cardiology

## 2021-07-04 NOTE — Telephone Encounter (Signed)
Prescription refill request for Eliquis received. Indication: afib  Last office visit: [name] 10/12/2020 Scr: 1.1, 12/05/2020 Age: 87 yo  Weight: 73.8 kg  Refill sent.

## 2021-08-02 DIAGNOSIS — Z85828 Personal history of other malignant neoplasm of skin: Secondary | ICD-10-CM | POA: Diagnosis not present

## 2021-08-02 DIAGNOSIS — L821 Other seborrheic keratosis: Secondary | ICD-10-CM | POA: Diagnosis not present

## 2021-08-02 DIAGNOSIS — L570 Actinic keratosis: Secondary | ICD-10-CM | POA: Diagnosis not present

## 2021-08-02 DIAGNOSIS — D0439 Carcinoma in situ of skin of other parts of face: Secondary | ICD-10-CM | POA: Diagnosis not present

## 2021-08-02 DIAGNOSIS — D485 Neoplasm of uncertain behavior of skin: Secondary | ICD-10-CM | POA: Diagnosis not present

## 2021-08-04 DIAGNOSIS — J9601 Acute respiratory failure with hypoxia: Secondary | ICD-10-CM | POA: Diagnosis present

## 2021-08-04 DIAGNOSIS — R000 Tachycardia, unspecified: Secondary | ICD-10-CM | POA: Diagnosis not present

## 2021-08-04 DIAGNOSIS — S14101A Unspecified injury at C1 level of cervical spinal cord, initial encounter: Secondary | ICD-10-CM | POA: Diagnosis present

## 2021-08-04 DIAGNOSIS — I469 Cardiac arrest, cause unspecified: Secondary | ICD-10-CM | POA: Diagnosis not present

## 2021-08-04 DIAGNOSIS — G319 Degenerative disease of nervous system, unspecified: Secondary | ICD-10-CM | POA: Diagnosis not present

## 2021-08-04 DIAGNOSIS — S12040A Displaced lateral mass fracture of first cervical vertebra, initial encounter for closed fracture: Secondary | ICD-10-CM | POA: Diagnosis not present

## 2021-08-04 DIAGNOSIS — M4313 Spondylolisthesis, cervicothoracic region: Secondary | ICD-10-CM | POA: Diagnosis not present

## 2021-08-04 DIAGNOSIS — I480 Paroxysmal atrial fibrillation: Secondary | ICD-10-CM | POA: Diagnosis present

## 2021-08-04 DIAGNOSIS — J9600 Acute respiratory failure, unspecified whether with hypoxia or hypercapnia: Secondary | ICD-10-CM | POA: Diagnosis not present

## 2021-08-04 DIAGNOSIS — G9349 Other encephalopathy: Secondary | ICD-10-CM | POA: Diagnosis present

## 2021-08-04 DIAGNOSIS — Z794 Long term (current) use of insulin: Secondary | ICD-10-CM | POA: Diagnosis not present

## 2021-08-04 DIAGNOSIS — S0990XA Unspecified injury of head, initial encounter: Secondary | ICD-10-CM | POA: Diagnosis not present

## 2021-08-04 DIAGNOSIS — M47812 Spondylosis without myelopathy or radiculopathy, cervical region: Secondary | ICD-10-CM | POA: Diagnosis not present

## 2021-08-04 DIAGNOSIS — R570 Cardiogenic shock: Secondary | ICD-10-CM | POA: Diagnosis present

## 2021-08-04 DIAGNOSIS — S12000A Unspecified displaced fracture of first cervical vertebra, initial encounter for closed fracture: Secondary | ICD-10-CM | POA: Diagnosis not present

## 2021-08-04 DIAGNOSIS — Z8674 Personal history of sudden cardiac arrest: Secondary | ICD-10-CM | POA: Diagnosis not present

## 2021-08-04 DIAGNOSIS — E782 Mixed hyperlipidemia: Secondary | ICD-10-CM | POA: Diagnosis present

## 2021-08-04 DIAGNOSIS — Z515 Encounter for palliative care: Secondary | ICD-10-CM | POA: Diagnosis not present

## 2021-08-04 DIAGNOSIS — I10 Essential (primary) hypertension: Secondary | ICD-10-CM | POA: Diagnosis present

## 2021-08-04 DIAGNOSIS — E1165 Type 2 diabetes mellitus with hyperglycemia: Secondary | ICD-10-CM | POA: Diagnosis present

## 2021-08-04 DIAGNOSIS — I493 Ventricular premature depolarization: Secondary | ICD-10-CM | POA: Diagnosis not present

## 2021-08-04 DIAGNOSIS — R0689 Other abnormalities of breathing: Secondary | ICD-10-CM | POA: Diagnosis not present

## 2021-08-04 DIAGNOSIS — E1159 Type 2 diabetes mellitus with other circulatory complications: Secondary | ICD-10-CM | POA: Diagnosis present

## 2021-08-04 DIAGNOSIS — Z955 Presence of coronary angioplasty implant and graft: Secondary | ICD-10-CM | POA: Diagnosis not present

## 2021-08-04 DIAGNOSIS — Z8673 Personal history of transient ischemic attack (TIA), and cerebral infarction without residual deficits: Secondary | ICD-10-CM | POA: Diagnosis not present

## 2021-08-04 DIAGNOSIS — S2243XA Multiple fractures of ribs, bilateral, initial encounter for closed fracture: Secondary | ICD-10-CM | POA: Diagnosis not present

## 2021-08-04 DIAGNOSIS — M532X2 Spinal instabilities, cervical region: Secondary | ICD-10-CM | POA: Diagnosis present

## 2021-08-04 DIAGNOSIS — R739 Hyperglycemia, unspecified: Secondary | ICD-10-CM | POA: Diagnosis not present

## 2021-08-04 DIAGNOSIS — D72820 Lymphocytosis (symptomatic): Secondary | ICD-10-CM | POA: Diagnosis not present

## 2021-08-04 DIAGNOSIS — Z4682 Encounter for fitting and adjustment of non-vascular catheter: Secondary | ICD-10-CM | POA: Diagnosis not present

## 2021-08-04 DIAGNOSIS — R4020 Unspecified coma: Secondary | ICD-10-CM | POA: Diagnosis not present

## 2021-08-04 DIAGNOSIS — I2510 Atherosclerotic heart disease of native coronary artery without angina pectoris: Secondary | ICD-10-CM | POA: Diagnosis not present

## 2021-08-04 DIAGNOSIS — J690 Pneumonitis due to inhalation of food and vomit: Secondary | ICD-10-CM | POA: Diagnosis not present

## 2021-08-04 DIAGNOSIS — M4802 Spinal stenosis, cervical region: Secondary | ICD-10-CM | POA: Diagnosis not present

## 2021-08-04 DIAGNOSIS — I700 Atherosclerosis of aorta: Secondary | ICD-10-CM | POA: Diagnosis not present

## 2021-08-04 DIAGNOSIS — J9811 Atelectasis: Secondary | ICD-10-CM | POA: Diagnosis not present

## 2021-08-04 DIAGNOSIS — S12090A Other displaced fracture of first cervical vertebra, initial encounter for closed fracture: Secondary | ICD-10-CM | POA: Diagnosis present

## 2021-08-04 DIAGNOSIS — G9340 Encephalopathy, unspecified: Secondary | ICD-10-CM | POA: Diagnosis not present

## 2021-08-04 DIAGNOSIS — M96A3 Multiple fractures of ribs associated with chest compression and cardiopulmonary resuscitation: Secondary | ICD-10-CM | POA: Diagnosis present

## 2021-08-04 DIAGNOSIS — N281 Cyst of kidney, acquired: Secondary | ICD-10-CM | POA: Diagnosis not present

## 2021-08-04 DIAGNOSIS — Z66 Do not resuscitate: Secondary | ICD-10-CM | POA: Diagnosis present

## 2021-08-04 DIAGNOSIS — Z7901 Long term (current) use of anticoagulants: Secondary | ICD-10-CM | POA: Diagnosis not present

## 2021-08-04 DIAGNOSIS — R404 Transient alteration of awareness: Secondary | ICD-10-CM | POA: Diagnosis not present

## 2021-08-04 DIAGNOSIS — I252 Old myocardial infarction: Secondary | ICD-10-CM | POA: Diagnosis not present

## 2021-08-04 DIAGNOSIS — K5730 Diverticulosis of large intestine without perforation or abscess without bleeding: Secondary | ICD-10-CM | POA: Diagnosis not present

## 2021-08-31 DEATH — deceased

## 2021-11-10 ENCOUNTER — Ambulatory Visit: Payer: Medicare Other | Admitting: Cardiology
